# Patient Record
Sex: Male | Born: 1935 | Race: White | Hispanic: No | Smoking: Current some day smoker
Health system: Southern US, Community
[De-identification: ages and names within clinical notes are randomized; demographics above are authoritative.]

## PROBLEM LIST (undated history)

## (undated) ENCOUNTER — Emergency Department (HOSPITAL_COMMUNITY): Disposition: A | Discharge status: Home / Self Care | Payer: Medicare Other

## (undated) DIAGNOSIS — G8929 Other chronic pain: Secondary | ICD-10-CM

## (undated) DIAGNOSIS — J189 Pneumonia, unspecified organism: Secondary | ICD-10-CM

## (undated) DIAGNOSIS — K922 Gastrointestinal hemorrhage, unspecified: Secondary | ICD-10-CM

## (undated) DIAGNOSIS — E119 Type 2 diabetes mellitus without complications: Secondary | ICD-10-CM

## (undated) DIAGNOSIS — J40 Bronchitis, not specified as acute or chronic: Secondary | ICD-10-CM

## (undated) DIAGNOSIS — R112 Nausea with vomiting, unspecified: Secondary | ICD-10-CM

## (undated) DIAGNOSIS — Z9889 Other specified postprocedural states: Secondary | ICD-10-CM

## (undated) DIAGNOSIS — Z5900 Homelessness unspecified: Secondary | ICD-10-CM

## (undated) DIAGNOSIS — I10 Essential (primary) hypertension: Secondary | ICD-10-CM

## (undated) DIAGNOSIS — K859 Acute pancreatitis without necrosis or infection, unspecified: Secondary | ICD-10-CM

## (undated) DIAGNOSIS — S42009A Fracture of unspecified part of unspecified clavicle, initial encounter for closed fracture: Secondary | ICD-10-CM

## (undated) DIAGNOSIS — E871 Hypo-osmolality and hyponatremia: Secondary | ICD-10-CM

## (undated) DIAGNOSIS — F329 Major depressive disorder, single episode, unspecified: Secondary | ICD-10-CM

## (undated) DIAGNOSIS — R0600 Dyspnea, unspecified: Secondary | ICD-10-CM

## (undated) DIAGNOSIS — M549 Dorsalgia, unspecified: Secondary | ICD-10-CM

## (undated) DIAGNOSIS — F32A Depression, unspecified: Secondary | ICD-10-CM

## (undated) DIAGNOSIS — J449 Chronic obstructive pulmonary disease, unspecified: Secondary | ICD-10-CM

## (undated) DIAGNOSIS — D649 Anemia, unspecified: Secondary | ICD-10-CM

## (undated) DIAGNOSIS — I4891 Unspecified atrial fibrillation: Secondary | ICD-10-CM

## (undated) HISTORY — PX: TONSILLECTOMY: SUR1361

## (undated) HISTORY — PX: ESOPHAGOGASTRODUODENOSCOPY: SHX1529

## (undated) HISTORY — PX: VASECTOMY: SHX75

## (undated) HISTORY — PX: APPENDECTOMY: SHX54

## (undated) HISTORY — PX: COLONOSCOPY: SHX174

---

## 2005-10-22 ENCOUNTER — Ambulatory Visit: Payer: Self-pay | Admitting: Internal Medicine

## 2005-12-17 ENCOUNTER — Ambulatory Visit: Payer: Self-pay | Admitting: Internal Medicine

## 2006-03-14 ENCOUNTER — Ambulatory Visit: Payer: Self-pay | Admitting: Internal Medicine

## 2006-04-04 ENCOUNTER — Ambulatory Visit: Payer: Self-pay | Admitting: Internal Medicine

## 2006-04-23 ENCOUNTER — Ambulatory Visit (HOSPITAL_COMMUNITY): Admission: RE | Admit: 2006-04-23 | Discharge: 2006-04-23 | Payer: Self-pay | Admitting: Internal Medicine

## 2007-08-17 ENCOUNTER — Emergency Department (HOSPITAL_COMMUNITY): Admission: EM | Admit: 2007-08-17 | Discharge: 2007-08-18 | Payer: Self-pay | Admitting: Emergency Medicine

## 2007-09-29 ENCOUNTER — Ambulatory Visit: Payer: Self-pay | Admitting: Internal Medicine

## 2008-03-05 ENCOUNTER — Inpatient Hospital Stay (HOSPITAL_COMMUNITY): Admission: EM | Admit: 2008-03-05 | Discharge: 2008-03-11 | Payer: Self-pay | Admitting: Emergency Medicine

## 2008-03-28 ENCOUNTER — Ambulatory Visit: Payer: Self-pay | Admitting: Internal Medicine

## 2008-10-25 ENCOUNTER — Emergency Department (HOSPITAL_COMMUNITY): Admission: EM | Admit: 2008-10-25 | Discharge: 2008-10-26 | Payer: Self-pay | Admitting: Emergency Medicine

## 2010-03-02 ENCOUNTER — Emergency Department (HOSPITAL_COMMUNITY)
Admission: EM | Admit: 2010-03-02 | Discharge: 2010-03-02 | Payer: Self-pay | Source: Home / Self Care | Admitting: Emergency Medicine

## 2010-03-05 LAB — DIFFERENTIAL
Basophils Relative: 0 % (ref 0–1)
Eosinophils Absolute: 0 10*3/uL (ref 0.0–0.7)
Eosinophils Relative: 0 % (ref 0–5)
Lymphs Abs: 0.9 10*3/uL (ref 0.7–4.0)
Neutrophils Relative %: 89 % — ABNORMAL HIGH (ref 43–77)

## 2010-03-05 LAB — URINALYSIS, ROUTINE W REFLEX MICROSCOPIC
Ketones, ur: 15 mg/dL — AB
Nitrite: NEGATIVE
Protein, ur: 30 mg/dL — AB

## 2010-03-05 LAB — HEPATIC FUNCTION PANEL
ALT: 19 U/L (ref 0–53)
AST: 33 U/L (ref 0–37)
Albumin: 3.5 g/dL (ref 3.5–5.2)
Alkaline Phosphatase: 235 U/L — ABNORMAL HIGH (ref 39–117)
Total Bilirubin: 0.9 mg/dL (ref 0.3–1.2)
Total Protein: 7.9 g/dL (ref 6.0–8.3)

## 2010-03-05 LAB — CBC
MCH: 32 pg (ref 26.0–34.0)
MCV: 92.5 fL (ref 78.0–100.0)
Platelets: 590 10*3/uL — ABNORMAL HIGH (ref 150–400)
RBC: 4.69 MIL/uL (ref 4.22–5.81)
RDW: 12.8 % (ref 11.5–15.5)
WBC: 20 10*3/uL — ABNORMAL HIGH (ref 4.0–10.5)

## 2010-03-05 LAB — BASIC METABOLIC PANEL
BUN: 44 mg/dL — ABNORMAL HIGH (ref 6–23)
Chloride: 96 mEq/L (ref 96–112)
Creatinine, Ser: 1.91 mg/dL — ABNORMAL HIGH (ref 0.4–1.5)
GFR calc Af Amer: 41 mL/min — ABNORMAL LOW (ref >60)
GFR calc non Af Amer: 34 mL/min — ABNORMAL LOW (ref >60)
Potassium: 4.1 mEq/L (ref 3.5–5.1)

## 2010-03-05 LAB — URINE MICROSCOPIC-ADD ON

## 2010-05-28 LAB — CBC
HCT: 35.5 % — ABNORMAL LOW (ref 39.0–52.0)
HCT: 39.1 % (ref 39.0–52.0)
HCT: 39.4 % (ref 39.0–52.0)
Hemoglobin: 13.1 g/dL (ref 13.0–17.0)
Hemoglobin: 13.2 g/dL (ref 13.0–17.0)
Hemoglobin: 14.3 g/dL (ref 13.0–17.0)
MCHC: 32.8 g/dL (ref 30.0–36.0)
MCHC: 33.6 g/dL (ref 30.0–36.0)
MCV: 90.6 fL (ref 78.0–100.0)
MCV: 91.9 fL (ref 78.0–100.0)
Platelets: 347 10*3/uL (ref 150–400)
Platelets: 386 10*3/uL (ref 150–400)
Platelets: 408 10*3/uL — ABNORMAL HIGH (ref 150–400)
Platelets: 466 10*3/uL — ABNORMAL HIGH (ref 150–400)
RBC: 4.35 MIL/uL (ref 4.22–5.81)
RDW: 14 % (ref 11.5–15.5)
RDW: 14 % (ref 11.5–15.5)
RDW: 14.3 % (ref 11.5–15.5)
WBC: 10.4 10*3/uL (ref 4.0–10.5)
WBC: 12.8 10*3/uL — ABNORMAL HIGH (ref 4.0–10.5)

## 2010-05-28 LAB — URINALYSIS, ROUTINE W REFLEX MICROSCOPIC
Bilirubin Urine: NEGATIVE
Glucose, UA: NEGATIVE mg/dL
Ketones, ur: NEGATIVE mg/dL
Leukocytes, UA: NEGATIVE
Protein, ur: 100 mg/dL — AB
pH: 6.5 (ref 5.0–8.0)

## 2010-05-28 LAB — BASIC METABOLIC PANEL
BUN: 10 mg/dL (ref 6–23)
BUN: 13 mg/dL (ref 6–23)
BUN: 16 mg/dL (ref 6–23)
BUN: 3 mg/dL — ABNORMAL LOW (ref 6–23)
BUN: 5 mg/dL — ABNORMAL LOW (ref 6–23)
BUN: 7 mg/dL (ref 6–23)
BUN: 8 mg/dL (ref 6–23)
CO2: 23 mEq/L (ref 19–32)
CO2: 28 mEq/L (ref 19–32)
Calcium: 8.6 mg/dL (ref 8.4–10.5)
Calcium: 9.2 mg/dL (ref 8.4–10.5)
Chloride: 100 mEq/L (ref 96–112)
Chloride: 104 mEq/L (ref 96–112)
Chloride: 89 mEq/L — ABNORMAL LOW (ref 96–112)
Chloride: 94 mEq/L — ABNORMAL LOW (ref 96–112)
Creatinine, Ser: 0.53 mg/dL (ref 0.4–1.5)
Creatinine, Ser: 0.73 mg/dL (ref 0.4–1.5)
Creatinine, Ser: 0.79 mg/dL (ref 0.4–1.5)
Creatinine, Ser: 0.98 mg/dL (ref 0.4–1.5)
Creatinine, Ser: 1.07 mg/dL (ref 0.4–1.5)
GFR calc Af Amer: >60 mL/min (ref >60)
GFR calc Af Amer: >60 mL/min (ref >60)
GFR calc non Af Amer: >60 mL/min (ref >60)
GFR calc non Af Amer: >60 mL/min (ref >60)
GFR calc non Af Amer: >60 mL/min (ref >60)
GFR calc non Af Amer: >60 mL/min (ref >60)
GFR calc non Af Amer: >60 mL/min (ref >60)
GFR calc non Af Amer: >60 mL/min (ref >60)
Glucose, Bld: 88 mg/dL (ref 70–99)
Glucose, Bld: 93 mg/dL (ref 70–99)
Glucose, Bld: 94 mg/dL (ref 70–99)
Glucose, Bld: 95 mg/dL (ref 70–99)
Potassium: 3.6 mEq/L (ref 3.5–5.1)
Potassium: 3.6 mEq/L (ref 3.5–5.1)
Potassium: 3.8 mEq/L (ref 3.5–5.1)
Potassium: 4.1 mEq/L (ref 3.5–5.1)
Sodium: 132 mEq/L — ABNORMAL LOW (ref 135–145)
Sodium: 133 mEq/L — ABNORMAL LOW (ref 135–145)

## 2010-05-28 LAB — RAPID URINE DRUG SCREEN, HOSP PERFORMED
Amphetamines: NOT DETECTED
Benzodiazepines: NOT DETECTED
Cocaine: NOT DETECTED
Opiates: NOT DETECTED
Tetrahydrocannabinol: NOT DETECTED

## 2010-05-28 LAB — FOLATE RBC: RBC Folate: 746 ng/mL — ABNORMAL HIGH (ref 180–600)

## 2010-05-28 LAB — DIFFERENTIAL
Basophils Absolute: 0.1 10*3/uL (ref 0.0–0.1)
Lymphocytes Relative: 4 % — ABNORMAL LOW (ref 12–46)
Monocytes Absolute: 0.7 10*3/uL (ref 0.1–1.0)
Neutro Abs: 17.9 10*3/uL — ABNORMAL HIGH (ref 1.7–7.7)
Neutrophils Relative %: 91 % — ABNORMAL HIGH (ref 43–77)

## 2010-05-28 LAB — COMPREHENSIVE METABOLIC PANEL
ALT: 23 U/L (ref 0–53)
Albumin: 3.6 g/dL (ref 3.5–5.2)
Calcium: 9.1 mg/dL (ref 8.4–10.5)
Glucose, Bld: 106 mg/dL — ABNORMAL HIGH (ref 70–99)
Sodium: 134 mEq/L — ABNORMAL LOW (ref 135–145)
Total Protein: 7.8 g/dL (ref 6.0–8.3)

## 2010-05-28 LAB — VITAMIN B12: Vitamin B-12: 590 pg/mL (ref 211–911)

## 2010-05-28 LAB — CREATININE, URINE, RANDOM: Creatinine, Urine: 56.4 mg/dL

## 2010-05-30 ENCOUNTER — Emergency Department (HOSPITAL_COMMUNITY): Payer: Medicare Other

## 2010-05-30 ENCOUNTER — Emergency Department (HOSPITAL_COMMUNITY)
Admission: EM | Admit: 2010-05-30 | Discharge: 2010-05-30 | Disposition: A | Discharge status: Home / Self Care | Payer: Medicare Other | Attending: Emergency Medicine | Admitting: Emergency Medicine

## 2010-05-30 DIAGNOSIS — M545 Low back pain, unspecified: Secondary | ICD-10-CM | POA: Insufficient documentation

## 2010-05-30 DIAGNOSIS — H81399 Other peripheral vertigo, unspecified ear: Secondary | ICD-10-CM | POA: Insufficient documentation

## 2010-05-30 DIAGNOSIS — R42 Dizziness and giddiness: Secondary | ICD-10-CM | POA: Insufficient documentation

## 2010-05-30 LAB — CBC
MCV: 86.9 fL (ref 78.0–100.0)
Platelets: 372 10*3/uL (ref 150–400)
RDW: 13.1 % (ref 11.5–15.5)
WBC: 10.8 10*3/uL — ABNORMAL HIGH (ref 4.0–10.5)

## 2010-05-30 LAB — BASIC METABOLIC PANEL
BUN: 18 mg/dL (ref 6–23)
GFR calc Af Amer: >60 mL/min (ref >60)
GFR calc non Af Amer: >60 mL/min (ref >60)
Potassium: 3.9 mEq/L (ref 3.5–5.1)

## 2010-05-30 LAB — DIFFERENTIAL
Basophils Absolute: 0.1 10*3/uL (ref 0.0–0.1)
Basophils Relative: 1 % (ref 0–1)
Eosinophils Absolute: 0.2 10*3/uL (ref 0.0–0.7)
Eosinophils Relative: 2 % (ref 0–5)
Lymphs Abs: 0.9 10*3/uL (ref 0.7–4.0)
Neutrophils Relative %: 82 % — ABNORMAL HIGH (ref 43–77)

## 2010-05-30 LAB — TROPONIN I: Troponin I: 0.01 ng/mL (ref 0.00–0.06)

## 2010-06-13 ENCOUNTER — Emergency Department (HOSPITAL_COMMUNITY)
Admission: EM | Admit: 2010-06-13 | Discharge: 2010-06-13 | Disposition: A | Discharge status: Home / Self Care | Payer: Medicare Other | Attending: Emergency Medicine | Admitting: Emergency Medicine

## 2010-06-13 ENCOUNTER — Emergency Department (HOSPITAL_COMMUNITY): Payer: Medicare Other

## 2010-06-13 DIAGNOSIS — M19049 Primary osteoarthritis, unspecified hand: Secondary | ICD-10-CM | POA: Insufficient documentation

## 2010-06-13 DIAGNOSIS — S51809A Unspecified open wound of unspecified forearm, initial encounter: Secondary | ICD-10-CM | POA: Insufficient documentation

## 2010-06-13 DIAGNOSIS — Y92009 Unspecified place in unspecified non-institutional (private) residence as the place of occurrence of the external cause: Secondary | ICD-10-CM | POA: Insufficient documentation

## 2010-06-13 DIAGNOSIS — W1809XA Striking against other object with subsequent fall, initial encounter: Secondary | ICD-10-CM | POA: Insufficient documentation

## 2010-06-26 NOTE — Discharge Summary (Signed)
NAMEWILLYS, Dale Mcfarland                ACCOUNT NO.:  [id number]   MEDICAL RECORD NO.:  [id number]          PATIENT TYPE:  INP   LOCATION:  1427                         FACILITY[facility]  [facility]   PHYSICIAN:  Altha Harm, MDDATE OF BIRTH:  29-Mar-1929   DATE OF ADMISSION:  03/04/2008  DATE OF DISCHARGE:  03/09/2008                               DISCHARGE SUMMARY   DISCHARGE DISPOSITION:  Home.   FINAL DISCHARGE DIAGNOSES:  1. Pneumonia.  2. 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  3. Emphysema.  4. Hypertension.  5. Tobacco use disorder.  6. Alcohol use disorder.  7. Gait disturbance.   DISCHARGE MEDICATIONS:  1. Avelox 400 mg p.o. daily x3.  2. Thiamine 100 mg p.o. daily.  3. Folate 1 mg p.o. daily.  4. Norvasc 5 mg p.o. daily.  5. Albuterol MDI 2 puffs p.o. q.4 h. p.r.n.  6. Atrovent MDI 1 puff inhaled p.o. q.8 h.   CONSULTATIONS:  None.   PROCEDURE:  None.   DIAGNOSTICS:  1. Chest x-ray two-view done on admission on March 04, 2008 which      shows left lower lobe pneumonia with small left pleural effusion.      Chronic interstitial opacities upper limits of normal, heart size,      COPD and emphysema.  2. Repeat chest x-ray done on March 06, 2008 shows improvement in      left lower lobe infiltrate.  A rounded density in the left upper      lobe which may represent a focal infiltrate,  pulmonary nodule or      healed fracture.  3. CT chest without contrast done on March 08, 2008 which shows      small layering of pleural effusion slightly larger on the left.  An      indistinct nodule in the lower lobe pulmonary opacity bilaterally.      Superimposed on changes of early pulmonary fibrosis and central      lobular emphysema.  Acute infection in the lower lobe is favored.   IMPRESSION:  1. Mediastinal lymphadenopathy in the form of increased number of      small nodes, favor reactive.  2. Cholelithiasis.  3. Multiple right-sided rib fractures, some maybe subacute.   LABORATORY DATA:  At the time of discharge, the patient's sodium is 123,  potassium 3.8, chloride 89, bicarb 24, BUN 5, creatinine 0.79, white  blood cell count 10.4, hemoglobin 11.9, hematocrit 35.5, platelet count  347.  Of note, the patient has a mildly elevated alkaline phosphatase of  133, however, his other liver enzymes are within normal limits.   ALLERGIES:  PENICILLIN.   CODE STATUS:  Full code.   PRIMARY CARE PHYSICIAN:  Dr. Linton Rump, [facility].   CHIEF COMPLAINT:  Cough, fever and chills.   HISTORY OF PRESENT ILLNESS:  Please refer to the H and P dictated by Dr.  Lovell Sheehan for details of the HPI.   HOSPITAL COURSE:  1. The patient was admitted and found to have pneumonia.  The patient  was started on IV antibiotics.  He improved clinically with      resolution of his hypoxia and normalization of his white blood cell      count.  The patient had no fevers while hospitalized.  The patient      was then transitioned over to oral Avelox which he tolerated      without any difficulty.  2. Hyponatremia.  The patient was found to have low sodium.  The      patient reports that approximately a year ago, he was told that his      sodium was a little low.  However, he does not know the specific      number level of sodium.  The patient's sodiums have decreased as      low as 123 despite repletion of his fluids.  Currently, the patient      is being fluid restricted with Lasix being added.  We will recheck      his sodium levels to evaluate for improvement in his serum sodium.      The patient has urine sodium and creatinine pending at this time.  3. Abnormal gait.  The patient reported an ataxic gait.  The patient      had been started on Reglan and this was discontinued.  Physical      therapy evaluated the patient and recommended the patient have a      rolling walker for stability, and did not feel there was  any      further therapy indicated on this patient.  4. Alcohol use disorder.  The patient is intermittently a heavy binge      drinker.  He denies any previous episodes of withdrawal.  The      patient is placed on a withdrawal protocol here in the hospital,      but has not really required any benzodiazepines and he has shown no      signs of withdrawal.  5. Tobacco use disorder.  The patient is a habitual tobacco user.  He      has been counseled in tobacco cessation and is in the contemplative      state of cessation abuse.      Altha Harm, MD  Electronically Signed     MAM/MEDQ  D:  03/09/2008  T:  03/09/2008  Job:  [id number]

## 2010-06-26 NOTE — H&P (Signed)
Dale Mcfarland, Dale Mcfarland                ACCOUNT NO.:  [id number]   MEDICAL RECORD NO.:  [id number]          PATIENT TYPE:  INP   LOCATION:  1427                         FACILITY:  [facility]   PHYSICIAN:  Della Goo, M.D. DATE OF BIRTH:  [birth year]   DATE OF ADMISSION:  03/05/2008  DATE OF DISCHARGE:                              HISTORY & PHYSICAL   PRIMARY CARE PHYSICIAN:  None.   CHIEF COMPLAINT:  Cough, fevers, chills.   HISTORY OF PRESENT ILLNESS:  This is a 77 year old male who presents to  the emergency department with complaints of worsening shortness of  breath, cough and chest congestion, worse over the past 3 days.  The  patient states that he has been short of breath and coughing for about  one month..  The patient has a history of heavy tobacco usage and  alcoholism and is currently homeless.   PAST MEDICAL HISTORY:  No documented past medical problems.   MEDICATIONS:  None.   PAST SURGICAL HISTORY:  None.   ALLERGIES:  PENICILLINS.   SOCIAL HISTORY:  The patient is a smoker.  He reports smoking  intermittently and he states that he does smoke occasionally one and  half packs daily.  He also reports drinking and he states that when he  can drink eating drinks one pint a day.   FAMILY HISTORY:  Noncontributory.   REVIEW OF SYSTEMS:  Pertinents are mentioned above.  All other organ  systems are negative.   PHYSICAL EXAMINATION FINDINGS:  GENERAL:  This is a 77 year old  disheveled, unkempt male who is thin but in no acute distress.  VITAL SIGNS: Temperature 97.2, blood pressure 200/110 initially, heart  rate 98, respirations 20, O2 sats 96% on 2 liters nasal cannula oxygen.  Blood pressure did improve to 138/89.  HEENT:  Examination normocephalic, atraumatic.  There is no scleral  icterus or scleral injection.  Pupils are equally round reactive to  light.  Extraocular movements are intact.  Funduscopic benign.  Nares  are patent bilaterally.  Oropharynx is clear.   Dentition is poor.  NECK:  Supple full range of motion.  No thyromegaly, adenopathy, jugular  venous distention.  CARDIOVASCULAR:  Regular rate and rhythm.  No murmurs, gallops or rubs.  LUNGS:  Diffuse rhonchorous breath sounds, no expiratory wheezes and no  rales.  ABDOMEN:  Positive bowel sounds, soft, nontender, nondistended.  No  hepatosplenomegaly.  EXTREMITIES:  Without cyanosis, clubbing or edema.  NEUROLOGIC:  Examination alert and oriented x3.  There are no focal  deficits on examination.   LABORATORY STUDIES:  White blood cell count 19.8, hemoglobin 14.3,  hematocrit 43.8, platelets 466.  Sodium 134, potassium 4.2, chloride  101, bicarb 24, BUN 16, creatinine 0.83 and glucose 106, albumin 3.6,  AST 26, ALT 23.  Urinalysis trace hemoglobin.  Urine drug screen  negative.  Chest x-ray reveals left lower lobe pneumonia with small  effusion, chronic interstitial opacities and COPD/emphysematous changes.   ASSESSMENT:  A 77 year old male being admitted with  1. Left lower lobe pneumonia.  2. Hypoxia.  3. Alcohol abuse.  4. Tobacco abuse.  PLAN:  The patient will be admitted and placed on IV antibiotic therapy.  He was administered Rocephin and azithromycin in the emergency  department.  And suffered an allergic reaction to most probably the  Rocephin.  He had itching after the administration.  The patient does  have a documented penicillin allergy and 15% penicillin allergies are  also allergic to cephalosporins, so this will be listed as an allergy  for this gentleman and his antibiotic therapy will be changed to Avelox  therapy.  Benadryl has also been ordered p.r.n. itching and rash.  The  patient will also be placed on supplemental oxygen and nebulizer  treatments.  The patient will be placed on the IV Ativan alcohol  withdrawal protocol, and a nicotine patch has also been ordered p.r.n.  medications for the elevated blood pressure will also be ordered for  now.  The  patient will be placed on Mucinex for congestion and  Robitussin for coughing symptoms and DVT and GI prophylaxis have been  ordered.  Further workup will ensue pending results of the patient's  clinical course and results of his laboratory studies.  Counseling will  be requested for tobacco cessation and alcohol cessation.      Della Goo, M.D.  Electronically Signed     HJ/MEDQ  D:  03/06/2008  T:  03/06/2008  Job:  [id number]

## 2010-06-26 NOTE — Discharge Summary (Signed)
NAMEBRAYON, Dale Mcfarland                ACCOUNT NO.:  [id number]   MEDICAL RECORD NO.:  [id number]          PATIENT TYPE:  INP   LOCATION:  1427                         [facility]AC[facility]   PHYSICIAN:  Altha Harm, MDDATE OF BIRTH:  1929/03/17   DATE OF ADMISSION:  03/04/2008  DATE OF DISCHARGE:  03/11/2008                               DISCHARGE SUMMARY   DISCHARGE DISPOSITION:  To homeless shelter.   FINAL DISCHARGE DIAGNOSES:  Please refer to the discharge summary on  January 27 for details of the discharge diagnoses.   DISCHARGE MEDICATIONS:  Modifications to that include Avelox 400 mg p.o.  daily x1 day.  Additional medication includes Lasix 20 mg p.o. daily.   Hospital course entirely up to the 27th is detailed in the discharge  summary dated that date.   ADDITIONAL HOSPITAL COURSE:  The patient continued to have an ataxic  gait and it was felt that he could not be discharged home at that time.  Particularly in light of the fact that the patient would be going to a  shelter and needed to be ambulatory in order to participate in the  shelter program, the patient's discharge was further held at that time  and physical therapy saw the patient and worked with the patient.  The  patient was able to ambulate 200 feet with only standby assist in their  estimation and they recommended that the patient should have a rolling  walker for additional support in uneven territory.  The patient  continued to have hyponatremia.  He has his fluids limited to 2 liters  per day and in addition Lasix started.  He had a mild increase in his  sodium from 123 to 126.  The patient is totally alert, awake, and  mentating well.  He has no symptoms that could be attributable to the  low sodium and it does appar that his low sodium is completely chronic.   FOLLOWUP:  The patient is to follow up with his primary care physician  at [facility] in 1 week.   DIETARY RESTRICTIONS:  The patient should be  on a fluid restriction of 2  liters of fluid per day and he should be on a regular diet.   Today the patient is clinically stable.  His temperature is 98.4, heart  rate is 77, blood pressure 130/62, respiratory rate of 18, O2 sat is 97%  on room air.  Ambulatory.  Breath sounds are clear to auscultation.  The  patient has a normal S1 and S2, no murmurs, rubs or gallops are noted.  PMI is nondisplaced.  No heaves or thrills on palpation.  Abdomen is  obese, soft, nontender, nondistended, no masses, no hepatosplenomegaly  noted.  In general, the patient  is alert, he is oriented x3, he is able to conduct normal conversation  with good cognition and insight.  The patient is clinically stable for  discharge.   Total time spent on discharge 41 minutes.      Altha Harm, MD  Electronically Signed     MAM/MEDQ  D:  03/11/2008  T:  03/11/2008  Job:  [id number]

## 2010-10-09 ENCOUNTER — Emergency Department (HOSPITAL_COMMUNITY)
Admission: EM | Admit: 2010-10-09 | Discharge: 2010-10-09 | Disposition: A | Discharge status: Home / Self Care | Payer: Medicare Other | Attending: Emergency Medicine | Admitting: Emergency Medicine

## 2010-10-09 ENCOUNTER — Emergency Department (HOSPITAL_COMMUNITY): Payer: Medicare Other

## 2010-10-09 DIAGNOSIS — Z5900 Homelessness unspecified: Secondary | ICD-10-CM | POA: Insufficient documentation

## 2010-10-09 DIAGNOSIS — M549 Dorsalgia, unspecified: Secondary | ICD-10-CM | POA: Insufficient documentation

## 2010-10-09 DIAGNOSIS — W1789XA Other fall from one level to another, initial encounter: Secondary | ICD-10-CM | POA: Insufficient documentation

## 2010-10-09 DIAGNOSIS — R05 Cough: Secondary | ICD-10-CM | POA: Insufficient documentation

## 2010-10-09 DIAGNOSIS — R059 Cough, unspecified: Secondary | ICD-10-CM | POA: Insufficient documentation

## 2010-10-09 DIAGNOSIS — R079 Chest pain, unspecified: Secondary | ICD-10-CM | POA: Insufficient documentation

## 2010-10-31 ENCOUNTER — Encounter (HOSPITAL_COMMUNITY): Payer: Self-pay | Admitting: Radiology

## 2010-10-31 ENCOUNTER — Emergency Department (HOSPITAL_COMMUNITY): Payer: Medicare Other

## 2010-10-31 ENCOUNTER — Emergency Department (HOSPITAL_COMMUNITY)
Admission: EM | Admit: 2010-10-31 | Discharge: 2010-10-31 | Disposition: A | Discharge status: Home / Self Care | Payer: Medicare Other | Attending: Emergency Medicine | Admitting: Emergency Medicine

## 2010-10-31 DIAGNOSIS — W19XXXA Unspecified fall, initial encounter: Secondary | ICD-10-CM | POA: Insufficient documentation

## 2010-10-31 DIAGNOSIS — F1010 Alcohol abuse, uncomplicated: Secondary | ICD-10-CM | POA: Insufficient documentation

## 2010-10-31 DIAGNOSIS — G9389 Other specified disorders of brain: Secondary | ICD-10-CM | POA: Insufficient documentation

## 2010-10-31 DIAGNOSIS — S060X1A Concussion with loss of consciousness of 30 minutes or less, initial encounter: Secondary | ICD-10-CM | POA: Insufficient documentation

## 2010-10-31 DIAGNOSIS — R51 Headache: Secondary | ICD-10-CM | POA: Insufficient documentation

## 2010-10-31 DIAGNOSIS — S0280XA Fracture of other specified skull and facial bones, unspecified side, initial encounter for closed fracture: Secondary | ICD-10-CM | POA: Insufficient documentation

## 2010-10-31 HISTORY — DX: Chronic obstructive pulmonary disease, unspecified: J44.9

## 2010-10-31 LAB — RAPID URINE DRUG SCREEN, HOSP PERFORMED
Barbiturates: NOT DETECTED
Cocaine: NOT DETECTED

## 2010-10-31 LAB — DIFFERENTIAL
Basophils Absolute: 0.1 10*3/uL (ref 0.0–0.1)
Eosinophils Absolute: 0.5 10*3/uL (ref 0.0–0.7)
Eosinophils Relative: 4 % (ref 0–5)
Lymphocytes Relative: 16 % (ref 12–46)
Monocytes Absolute: 1.1 10*3/uL — ABNORMAL HIGH (ref 0.1–1.0)

## 2010-10-31 LAB — URINALYSIS, ROUTINE W REFLEX MICROSCOPIC
Bilirubin Urine: NEGATIVE
Ketones, ur: NEGATIVE mg/dL
Leukocytes, UA: NEGATIVE
Nitrite: NEGATIVE
Protein, ur: NEGATIVE mg/dL
pH: 6 (ref 5.0–8.0)

## 2010-10-31 LAB — ETHANOL: Alcohol, Ethyl (B): 157 mg/dL — ABNORMAL HIGH (ref 0–11)

## 2010-10-31 LAB — CBC
HCT: 30.9 % — ABNORMAL LOW (ref 39.0–52.0)
MCHC: 30.7 g/dL (ref 30.0–36.0)
MCV: 77.3 fL — ABNORMAL LOW (ref 78.0–100.0)
Platelets: 356 10*3/uL (ref 150–400)
RDW: 19 % — ABNORMAL HIGH (ref 11.5–15.5)

## 2010-10-31 LAB — POCT I-STAT, CHEM 8
BUN: 17 mg/dL (ref 6–23)
Calcium, Ion: 1.15 mmol/L (ref 1.12–1.32)
HCT: 34 % — ABNORMAL LOW (ref 39.0–52.0)
Hemoglobin: 11.6 g/dL — ABNORMAL LOW (ref 13.0–17.0)
Sodium: 138 mEq/L (ref 135–145)
TCO2: 22 mmol/L (ref 0–100)

## 2010-12-05 ENCOUNTER — Emergency Department (HOSPITAL_COMMUNITY)
Admission: EM | Admit: 2010-12-05 | Discharge: 2010-12-05 | Disposition: A | Discharge status: Home / Self Care | Payer: Medicare Other | Attending: Emergency Medicine | Admitting: Emergency Medicine

## 2010-12-05 DIAGNOSIS — F1010 Alcohol abuse, uncomplicated: Secondary | ICD-10-CM | POA: Insufficient documentation

## 2011-02-02 ENCOUNTER — Encounter (HOSPITAL_COMMUNITY): Payer: Self-pay

## 2011-02-02 ENCOUNTER — Emergency Department (HOSPITAL_COMMUNITY): Payer: Medicare Other

## 2011-02-02 ENCOUNTER — Other Ambulatory Visit: Payer: Self-pay

## 2011-02-02 ENCOUNTER — Inpatient Hospital Stay (HOSPITAL_COMMUNITY)
Admission: EM | Admit: 2011-02-02 | Discharge: 2011-02-06 | DRG: 178 | Disposition: A | Discharge status: Home / Self Care | Payer: Medicare Other | Attending: Internal Medicine | Admitting: Internal Medicine

## 2011-02-02 DIAGNOSIS — N390 Urinary tract infection, site not specified: Secondary | ICD-10-CM

## 2011-02-02 DIAGNOSIS — Z5900 Homelessness unspecified: Secondary | ICD-10-CM

## 2011-02-02 DIAGNOSIS — E872 Acidosis, unspecified: Secondary | ICD-10-CM | POA: Diagnosis present

## 2011-02-02 DIAGNOSIS — B3781 Candidal esophagitis: Secondary | ICD-10-CM | POA: Diagnosis present

## 2011-02-02 DIAGNOSIS — T68XXXA Hypothermia, initial encounter: Secondary | ICD-10-CM | POA: Diagnosis present

## 2011-02-02 DIAGNOSIS — D62 Acute posthemorrhagic anemia: Secondary | ICD-10-CM | POA: Diagnosis present

## 2011-02-02 DIAGNOSIS — K922 Gastrointestinal hemorrhage, unspecified: Secondary | ICD-10-CM | POA: Diagnosis present

## 2011-02-02 DIAGNOSIS — D72829 Elevated white blood cell count, unspecified: Secondary | ICD-10-CM | POA: Diagnosis present

## 2011-02-02 DIAGNOSIS — K449 Diaphragmatic hernia without obstruction or gangrene: Secondary | ICD-10-CM | POA: Diagnosis present

## 2011-02-02 DIAGNOSIS — J690 Pneumonitis due to inhalation of food and vomit: Principal | ICD-10-CM | POA: Diagnosis present

## 2011-02-02 DIAGNOSIS — I959 Hypotension, unspecified: Secondary | ICD-10-CM | POA: Diagnosis present

## 2011-02-02 DIAGNOSIS — X31XXXA Exposure to excessive natural cold, initial encounter: Secondary | ICD-10-CM | POA: Diagnosis present

## 2011-02-02 DIAGNOSIS — D500 Iron deficiency anemia secondary to blood loss (chronic): Secondary | ICD-10-CM | POA: Diagnosis present

## 2011-02-02 DIAGNOSIS — I10 Essential (primary) hypertension: Secondary | ICD-10-CM | POA: Diagnosis present

## 2011-02-02 DIAGNOSIS — R6510 Systemic inflammatory response syndrome (SIRS) of non-infectious origin without acute organ dysfunction: Secondary | ICD-10-CM

## 2011-02-02 DIAGNOSIS — J189 Pneumonia, unspecified organism: Secondary | ICD-10-CM | POA: Diagnosis present

## 2011-02-02 DIAGNOSIS — K5900 Constipation, unspecified: Secondary | ICD-10-CM | POA: Diagnosis not present

## 2011-02-02 DIAGNOSIS — G8929 Other chronic pain: Secondary | ICD-10-CM | POA: Diagnosis present

## 2011-02-02 DIAGNOSIS — F1020 Alcohol dependence, uncomplicated: Secondary | ICD-10-CM | POA: Diagnosis present

## 2011-02-02 DIAGNOSIS — J4489 Other specified chronic obstructive pulmonary disease: Secondary | ICD-10-CM | POA: Diagnosis present

## 2011-02-02 DIAGNOSIS — Y999 Unspecified external cause status: Secondary | ICD-10-CM

## 2011-02-02 DIAGNOSIS — J449 Chronic obstructive pulmonary disease, unspecified: Secondary | ICD-10-CM | POA: Diagnosis present

## 2011-02-02 DIAGNOSIS — F17200 Nicotine dependence, unspecified, uncomplicated: Secondary | ICD-10-CM | POA: Diagnosis present

## 2011-02-02 DIAGNOSIS — M549 Dorsalgia, unspecified: Secondary | ICD-10-CM | POA: Diagnosis present

## 2011-02-02 DIAGNOSIS — Z880 Allergy status to penicillin: Secondary | ICD-10-CM

## 2011-02-02 HISTORY — DX: Major depressive disorder, single episode, unspecified: F32.9

## 2011-02-02 HISTORY — DX: Pneumonia, unspecified organism: J18.9

## 2011-02-02 HISTORY — DX: Dorsalgia, unspecified: M54.9

## 2011-02-02 HISTORY — DX: Depression, unspecified: F32.A

## 2011-02-02 HISTORY — DX: Other chronic pain: G89.29

## 2011-02-02 LAB — CBC
HCT: 25.3 % — ABNORMAL LOW (ref 39.0–52.0)
Hemoglobin: 7.9 g/dL — ABNORMAL LOW (ref 13.0–17.0)
MCV: 79.3 fL (ref 78.0–100.0)
WBC: 23.1 10*3/uL — ABNORMAL HIGH (ref 4.0–10.5)

## 2011-02-02 LAB — COMPREHENSIVE METABOLIC PANEL
Albumin: 3.1 g/dL — ABNORMAL LOW (ref 3.5–5.2)
Alkaline Phosphatase: 118 U/L — ABNORMAL HIGH (ref 39–117)
BUN: 27 mg/dL — ABNORMAL HIGH (ref 6–23)
CO2: 22 mEq/L (ref 19–32)
Chloride: 97 mEq/L (ref 96–112)
GFR calc Af Amer: 80 mL/min — ABNORMAL LOW (ref >90)
GFR calc non Af Amer: 69 mL/min — ABNORMAL LOW (ref >90)
Glucose, Bld: 110 mg/dL — ABNORMAL HIGH (ref 70–99)
Potassium: 4.7 mEq/L (ref 3.5–5.1)
Total Bilirubin: 0.2 mg/dL — ABNORMAL LOW (ref 0.3–1.2)

## 2011-02-02 LAB — DIFFERENTIAL
Basophils Relative: 0 % (ref 0–1)
Eosinophils Relative: 0 % (ref 0–5)
Lymphocytes Relative: 2 % — ABNORMAL LOW (ref 12–46)
Lymphs Abs: 0.5 10*3/uL — ABNORMAL LOW (ref 0.7–4.0)
Monocytes Relative: 6 % (ref 3–12)
Neutro Abs: 21.2 10*3/uL — ABNORMAL HIGH (ref 1.7–7.7)

## 2011-02-02 LAB — RAPID URINE DRUG SCREEN, HOSP PERFORMED: Opiates: POSITIVE — AB

## 2011-02-02 LAB — PREPARE RBC (CROSSMATCH)

## 2011-02-02 LAB — URINE MICROSCOPIC-ADD ON

## 2011-02-02 LAB — RETICULOCYTES
RBC.: 2.9 MIL/uL — ABNORMAL LOW (ref 4.22–5.81)
Retic Count, Absolute: 69.6 10*3/uL (ref 19.0–186.0)
Retic Ct Pct: 2.4 % (ref 0.4–3.1)

## 2011-02-02 LAB — URINALYSIS, ROUTINE W REFLEX MICROSCOPIC
Bilirubin Urine: NEGATIVE
Leukocytes, UA: NEGATIVE
Nitrite: POSITIVE — AB
Specific Gravity, Urine: 1.019 (ref 1.005–1.030)
pH: 6 (ref 5.0–8.0)

## 2011-02-02 LAB — ABO/RH: ABO/RH(D): O NEG

## 2011-02-02 LAB — CARDIAC PANEL(CRET KIN+CKTOT+MB+TROPI): Total CK: 161 U/L (ref 7–232)

## 2011-02-02 LAB — AMMONIA: Ammonia: <10 umol/L — ABNORMAL LOW (ref 11–60)

## 2011-02-02 LAB — PRO B NATRIURETIC PEPTIDE: Pro B Natriuretic peptide (BNP): 2075 pg/mL — ABNORMAL HIGH (ref 0–450)

## 2011-02-02 MED ORDER — LORAZEPAM 1 MG PO TABS
1.0000 mg | ORAL_TABLET | Freq: Four times a day (QID) | ORAL | Status: AC | PRN
  Administered 2011-02-04: 1 mg via ORAL
  Filled 2011-02-02: qty 1

## 2011-02-02 MED ORDER — VITAMIN B-1 100 MG PO TABS
100.0000 mg | ORAL_TABLET | Freq: Every day | ORAL | Status: DC
  Administered 2011-02-04 – 2011-02-06 (×3): 100 mg via ORAL
  Filled 2011-02-02 (×4): qty 1

## 2011-02-02 MED ORDER — SODIUM CHLORIDE 0.9 % IV SOLN
1.0000 g | Freq: Three times a day (TID) | INTRAVENOUS | Status: DC
  Administered 2011-02-02 – 2011-02-03 (×3): 1 g via INTRAVENOUS
  Filled 2011-02-02 (×4): qty 1

## 2011-02-02 MED ORDER — VANCOMYCIN HCL IN DEXTROSE 1-5 GM/200ML-% IV SOLN
1000.0000 mg | INTRAVENOUS | Status: AC
  Administered 2011-02-02: 1000 mg via INTRAVENOUS
  Filled 2011-02-02: qty 200

## 2011-02-02 MED ORDER — LORAZEPAM 2 MG/ML IJ SOLN: 1.0000 mg | Freq: Four times a day (QID) | INTRAMUSCULAR | Status: AC | PRN

## 2011-02-02 MED ORDER — MORPHINE SULFATE 4 MG/ML IJ SOLN
4.0000 mg | Freq: Once | INTRAMUSCULAR | Status: AC
  Administered 2011-02-02: 4 mg via INTRAVENOUS
  Filled 2011-02-02: qty 1

## 2011-02-02 MED ORDER — ADULT MULTIVITAMIN W/MINERALS CH
1.0000 | ORAL_TABLET | Freq: Every day | ORAL | Status: DC
  Administered 2011-02-03 – 2011-02-06 (×4): 1 via ORAL
  Filled 2011-02-02 (×4): qty 1

## 2011-02-02 MED ORDER — SODIUM CHLORIDE 0.9 % IV BOLUS (SEPSIS)
500.0000 mL | Freq: Once | INTRAVENOUS | Status: AC
  Administered 2011-02-02: 500 mL via INTRAVENOUS

## 2011-02-02 MED ORDER — PANTOPRAZOLE SODIUM 40 MG IV SOLR
40.0000 mg | INTRAVENOUS | Status: DC
  Administered 2011-02-02 – 2011-02-03 (×2): 40 mg via INTRAVENOUS
  Filled 2011-02-02 (×3): qty 40

## 2011-02-02 MED ORDER — LEVOFLOXACIN IN D5W 750 MG/150ML IV SOLN
750.0000 mg | INTRAVENOUS | Status: DC
  Administered 2011-02-02 – 2011-02-05 (×4): 750 mg via INTRAVENOUS
  Filled 2011-02-02 (×4): qty 150

## 2011-02-02 MED ORDER — THIAMINE HCL 100 MG/ML IJ SOLN
100.0000 mg | Freq: Every day | INTRAMUSCULAR | Status: DC
  Administered 2011-02-03: 100 mg via INTRAVENOUS
  Filled 2011-02-02 (×4): qty 2

## 2011-02-02 MED ORDER — FOLIC ACID 1 MG PO TABS
1.0000 mg | ORAL_TABLET | Freq: Every day | ORAL | Status: DC
  Administered 2011-02-03 – 2011-02-06 (×4): 1 mg via ORAL
  Filled 2011-02-02 (×4): qty 1

## 2011-02-02 MED ORDER — SODIUM CHLORIDE 0.9 % IV SOLN
Freq: Once | INTRAVENOUS | Status: AC
  Administered 2011-02-02: 15:00:00 via INTRAVENOUS

## 2011-02-02 MED ORDER — SODIUM CHLORIDE 0.9 % IV BOLUS (SEPSIS)
1000.0000 mL | Freq: Once | INTRAVENOUS | Status: AC
  Administered 2011-02-02: 1000 mL via INTRAVENOUS

## 2011-02-02 NOTE — ED Notes (Signed)
EAV:[id number]> Expected date:02/02/11<BR> Expected time:12:27 PM<BR> Means of arrival:Ambulance<BR> Comments:<BR> GC M61. 77 yo m. Homeless. Outside all night. Cold exposure. Tympanic temp 91. Stable vitals 10 min eta

## 2011-02-02 NOTE — ED Provider Notes (Signed)
History     CSN: [id number]  Arrival date & time 02/02/11  1233   First MD Initiated Contact with Patient 02/02/11 1252      Chief Complaint  Patient presents with  . Shortness of Breath  . Back Pain  . Leg Pain    thighs    (Consider location/radiation/quality/duration/timing/severity/associated sxs/prior treatment) HPI  [name] history of alcohol abuse, COPD, chronic back pain, homeless presents with multiple complaints. The patient states that he has been falling more than usual recently. He states that his last fall was this morning. He states he does not remember the events surrounding the fall or he hit his head or have loss of consciousness. He complains of mild headache without dizziness, nausea, vomiting. He denies neck pain but states that his chronic back pain which is in the lumbar spine is worse than usual. He has pain radiating down his legs. He denies numbness, tingling, weakness of his extremities. He remains ambulatory. he does complain of pain to his anterior thighs bilaterally. He states that they're cold and that he's been out all night sleeping in a tent. he denies abdominal pain, Denies hematuria/dysuria/freq/urgency. He does complain of shortness of breath since awakening this morning. He denies chest pain. Denies h/o VTE in self or family. No recent hosp/surg/immob. No h/o cancer. Denies exogenous hormone use, no leg pain or swelling.   Past Medical History  Diagnosis Date  . COPD (chronic obstructive pulmonary disease)   . Chronic back pain     Past Surgical History  Procedure Date  . Tonsillectomy     History reviewed. No pertinent family history.  History  Substance Use Topics  . Smoking status: Current Some Day Smoker -- 1.0 packs/day    Types: Cigarettes  . Smokeless tobacco: Not on file  . Alcohol Use: Yes     174 ml of scoth weekly    Review of Systems  All other systems reviewed and are negative.   except as noted HPI  Allergies    Penicillins  Home Medications  No current outpatient prescriptions on file.  BP 98/58  Pulse 86  Temp(Src) 98.5 F (36.9 C) (Oral)  Resp 21  Ht 5\' 7"  (1.702 m)  Wt 130 lb (58.968 kg)  BMI 20.36 kg/m2  SpO2 94%  Physical Exam  Nursing note and vitals reviewed. Constitutional: He is oriented to person, place, and time. He appears well-developed and well-nourished. No distress.  HENT:  Head: Atraumatic.  Mouth/Throat: Oropharynx is clear and moist.  Eyes: Conjunctivae are normal. Pupils are equal, round, and reactive to light.  Neck: Neck supple.  Cardiovascular: Normal rate, regular rhythm, normal heart sounds and intact distal pulses.  Exam reveals no gallop and no friction rub.   No murmur heard. Pulmonary/Chest: Effort normal. No respiratory distress. He has no wheezes. He has rales.       Bibasilar rales Able to speak in complete sentences, not tachypneic  Abdominal: Soft. Bowel sounds are normal. There is no tenderness. There is no rebound and no guarding.  Genitourinary: Penis normal.       Perineum unremarkable  Musculoskeletal: Normal range of motion. He exhibits no edema and no tenderness.       Min ttp b/l anterior thighs  No midline c/t/l/s ttp R paraspinal lumbar ttp  Neurological: He is alert and oriented to person, place, and time. No cranial nerve deficit. He exhibits normal muscle tone. Coordination normal.       Strength 5/5 all extremities No  facial droop   Skin: Skin is warm and dry.  Psychiatric: He has a normal mood and affect.    Date: 02/02/2011  Rate: 90  Rhythm: normal sinus rhythm  QRS Axis: normal  Intervals: normal  ST/T Wave abnormalities: nonspecific ST/T changes  Conduction Disutrbances:nonspecific intraventricular conduction delay  Narrative Interpretation: baseline artifact all leads  Old EKG Reviewed: changes noted   ED Course  Procedures (including critical care time)  CRITICAL CARE Performed by: Forbes Cellar   Total  critical care time:  Critical care time was exclusive of separately billable procedures and treating other patients.  Critical care was necessary to treat or prevent imminent or life-threatening deterioration.  Critical care was time spent personally by me on the following activities: development of treatment plan with patient and/or surrogate as well as nursing, discussions with consultants, evaluation of patient's response to treatment, examination of patient, obtaining history from patient or surrogate, ordering and performing treatments and interventions, ordering and review of laboratory studies, ordering and review of radiographic studies, pulse oximetry and re-evaluation of patient's condition.   Labs Reviewed  CBC - Abnormal; Notable for the following:    WBC 23.1 (*)    RBC 3.19 (*)    Hemoglobin 7.9 (*)    HCT 25.3 (*)    MCH 24.8 (*)    Platelets 491 (*)    All other components within normal limits  DIFFERENTIAL - Abnormal; Notable for the following:    Neutrophils Relative 92 (*)    Lymphocytes Relative 2 (*)    Neutro Abs 21.2 (*)    Lymphs Abs 0.5 (*)    Monocytes Absolute 1.4 (*)    All other components within normal limits  COMPREHENSIVE METABOLIC PANEL - Abnormal; Notable for the following:    Glucose, Bld 110 (*)    BUN 27 (*)    Albumin 3.1 (*)    Alkaline Phosphatase 118 (*)    Total Bilirubin 0.2 (*)    GFR calc non Af Amer 69 (*)    GFR calc Af Amer 80 (*)    All other components within normal limits  CARDIAC PANEL(CRET KIN+CKTOT+MB+TROPI) - Abnormal; Notable for the following:    CK, MB 6.8 (*)    Relative Index 4.2 (*)    All other components within normal limits  PRO B NATRIURETIC PEPTIDE - Abnormal; Notable for the following:    Pro B Natriuretic peptide (BNP) 2075.0 (*)    All other components within normal limits  LACTIC ACID, PLASMA - Abnormal; Notable for the following:    Lactic Acid, Venous 3.6 (*)    All other components within normal  limits  URINALYSIS, ROUTINE W REFLEX MICROSCOPIC - Abnormal; Notable for the following:    APPearance CLOUDY (*)    Protein, ur 30 (*)    Nitrite POSITIVE (*)    All other components within normal limits  URINE MICROSCOPIC-ADD ON - Abnormal; Notable for the following:    Bacteria, UA MANY (*)    Casts HYALINE CASTS (*)    All other components within normal limits  CULTURE, BLOOD (ROUTINE X 2)  CULTURE, BLOOD (ROUTINE X 2)  URINE CULTURE   Dg Chest 2 View  02/02/2011  *RADIOLOGY REPORT*  Clinical Data: Chronic back pain.  Fall.  CHEST - 2 VIEW  Comparison: Chest radiograph 10/09/2010.  Findings: There is diffuse bilateral airspace disease of the perihilar and basilar predominant distribution.  This is most compatible with pulmonary edema and mild CHF.  Cardiomegaly is present.  Aortic arch atherosclerosis.  Multi focal pneumonia is another differential consideration however the symmetry favors CHF. Healed fracture of the left clavicle.  No pleural effusion is identified.  Radiographic follow-up is recommended to ensure clearing and exclude an underlying lesion.  IMPRESSION: Bilateral basilar predominant airspace disease and cardiomegaly are most compatible with mild CHF.  Multifocal pneumonia given secondary consideration.  Original Report Authenticated By: Andreas Newport, M.D.   Dg Lumbar Spine Complete  02/02/2011  *RADIOLOGY REPORT*  Clinical Data: Back pain.  LUMBAR SPINE - COMPLETE 4+ VIEW  Comparison: 03/02/2010.  Findings: Chronic L2 compression fractures present with slightly greater than 50% loss of vertebral body height.  The alignment and compression fracture appear unchanged compared to the prior exam. Abdominal aortic aneurysm is present.  Aortoiliac atherosclerosis is present.  No new lumbar compression fracture is identified. Lower lumbar facet arthrosis.  Osteopenia.  Trace anterolisthesis of L4 on L5 is present.  Sacrum appears intact.  Per CMS PQRS reporting requirements (PQRS  Measure 24): Given the patient's age of greater than 50 and the fracture site (hip, distal radius, or spine), the patient should be tested for osteoporosis using DXA, and the appropriate treatment considered based on the DXA results.  IMPRESSION: Chronic L2 compression fracture.  No acute osseous abnormality.  Original Report Authenticated By: Andreas Newport, M.D.   Dg Pelvis 1-2 Views  02/02/2011  *RADIOLOGY REPORT*  Clinical Data: Fall.  Pelvic pain.  PELVIS - 1-2 VIEW  Comparison: None.  Findings: Pelvic rings appear intact.  The patient is slightly oblique.  Atherosclerosis is noted.  Sacral arcades are obscured by overlying bowel gas.  Hip joint spaces appear symmetric. Osteopenia.  IMPRESSION: No acute osseous abnormality.  Original Report Authenticated By: Andreas Newport, M.D.   Ct Head Wo Contrast  02/02/2011  *RADIOLOGY REPORT*  Clinical Data: Short of breath, back pain and leg pain  CT HEAD WITHOUT CONTRAST  Technique:  Contiguous axial images were obtained from the base of the skull through the vertex without contrast.  Comparison: CT 10/31/2010  Findings: No acute intracranial hemorrhage.  No focal mass lesion. No CT evidence of acute infarction.   No midline shift or mass effect.  No hydrocephalus.  Basilar cisterns are patent.  There is generalized cortical atrophy and periventricular white matter hypodensities unchanged from prior. Paranasal sinuses and mastoid air cells are clear.  Orbits are normal.  IMPRESSION:  1.  No acute intracranial findings. 2.  Atrophy and microvascular disease. 3.  No change from prior.  Original Report Authenticated By: Genevive Bi, M.D.     1. Pneumonia   2. SIRS (systemic inflammatory response syndrome)   3. Hypothermia   4. UTI (lower urinary tract infection)     MDM  PW multiple complaints. Found to have PNA by CXR and UTI as well. Hypothermic. Bear hugger in place. Broad spectrum abx. PCN allergy. Patient does not appear to be fluid overloaded,  clinically CHF less likely.  Transient hypotension, improved with IVF. SIRS/early sepsis. Lactate elevated. Patient appears well. D/W Critical care on call. Recommend hospitalist admit. D/W Hospitalist. Pt admitted to [facility]- Stepdown.        Forbes Cellar, MD 02/02/11 [id number]

## 2011-02-02 NOTE — H&P (Signed)
PCP:   No primary provider on file.   Chief Complaint:  "I threw up coffee colored material since yestrday after drinking a bottle of scotch"  HPI: Dale Mcfarland is a 77 year old gentleman, apparently alcoholic, just came out of prison yesterday where he spent 45 days for?, "lives in a tent with a bunch of guys" . He came in today with complaints of vomiting coffee ground material after drinking the scotch. His history is rather confusing but he also complaints of sob, denies fever, no diarrhea. Admits to some abdominal pain. He has used some ibuprofen for pain recently. He also has pleuritic right sided chest pain which he attributes to a remote hx of broken rib. He was found to be hypothermic-94, hypotensive- 78 systolic, acidotic- lactic acid 3.6, to have cxr suggesting PNA, wbc 23,000. Hb 7.9(baseline 9.5 in september). Patient last had vomiting this morning. He has been given vanc/meropenem/levaquin, ivf bolus(1.5liters) with improvement in BP and temp and referred to hospitalist service for further management.  Review of Systems:  Unremarkable except as highlighted above. Past Medical History: Past Medical History  Diagnosis Date  . COPD (chronic obstructive pulmonary disease)   . Chronic back pain    Past Surgical History  Procedure Date  . Tonsillectomy     Medications: Prior to Admission medications   Not on File    Allergies:   Allergies  Allergen Reactions  . Penicillins Itching    Social History:  reports that he has been smoking Cigarettes.  He has been smoking about 1 pack per day. He does not have any smokeless tobacco history on file. He reports that he drinks alcohol. His drug history not on file.  Family History: History reviewed. No pertinent family history.  Physical Exam: Filed Vitals:   02/02/11 1414 02/02/11 1423 02/02/11 1432 02/02/11 1644  BP: 78/52  98/58 159/78  Pulse: 86   102  Temp: 98.1 F (36.7 C) 98.5 F (36.9 C)  98.2 F (36.8 C)  TempSrc:  Oral Oral  Oral  Resp:      Height:      Weight:      SpO2: 94%   99%   General appearance: alert, cooperative and appears older than stated age Head: Normocephalic, without obvious abnormality, atraumatic Eyes: conjunctivae/corneas clear. PERRL, EOM's intact. Fundi benign. Throat: lips, mucosa, and tongue normal; teeth and gums normal Resp:scattered rhonchi bilaterally. Cardio: regular rate and rhythm, S1, S2 normal, no murmur, click, rub or gallop GI: soft, non-tender; bowel sounds normal; no masses,  no organomegaly Extremities: extremities normal, atraumatic, no cyanosis or edema Pulses: 2+ and symmetric Neurologic: Grossly normal   Labs on Admission:   Basename 02/02/11 1400  NA 135  K 4.7  CL 97  CO2 22  GLUCOSE 110*  BUN 27*  CREATININE 1.00  CALCIUM 8.9  MG --  PHOS --    Basename 02/02/11 1400  AST 16  ALT 12  ALKPHOS 118*  BILITOT 0.2*  PROT 6.7  ALBUMIN 3.1*   No results found for this basename: LIPASE:2,AMYLASE:2 in the last 72 hours  Basename 02/02/11 1400  WBC 23.1*  NEUTROABS 21.2*  HGB 7.9*  HCT 25.3*  MCV 79.3  PLT 491*    Basename 02/02/11 1400  CKTOTAL 161  CKMB 6.8*  CKMBINDEX --  TROPONINI <0.30   No results found for this basename: TSH,T4TOTAL,FREET3,T3FREE,THYROIDAB in the last 72 hours No results found for this basename: VITAMINB12:2,FOLATE:2,FERRITIN:2,TIBC:2,IRON:2,RETICCTPCT:2 in the last 72 hours  Radiological Exams on  Admission: Dg Chest 2 View  02/02/2011  *RADIOLOGY REPORT*  Clinical Data: Chronic back pain.  Fall.  CHEST - 2 VIEW  Comparison: Chest radiograph 10/09/2010.  Findings: There is diffuse bilateral airspace disease of the perihilar and basilar predominant distribution.  This is most compatible with pulmonary edema and mild CHF.  Cardiomegaly is present.  Aortic arch atherosclerosis.  Multi focal pneumonia is another differential consideration however the symmetry favors CHF. Healed fracture of the left clavicle.   No pleural effusion is identified.  Radiographic follow-up is recommended to ensure clearing and exclude an underlying lesion.  IMPRESSION: Bilateral basilar predominant airspace disease and cardiomegaly are most compatible with mild CHF.  Multifocal pneumonia given secondary consideration.  Original Report Authenticated By: Andreas Newport, M.D.   Dg Lumbar Spine Complete  02/02/2011  *RADIOLOGY REPORT*  Clinical Data: Back pain.  LUMBAR SPINE - COMPLETE 4+ VIEW  Comparison: 03/02/2010.  Findings: Chronic L2 compression fractures present with slightly greater than 50% loss of vertebral body height.  The alignment and compression fracture appear unchanged compared to the prior exam. Abdominal aortic aneurysm is present.  Aortoiliac atherosclerosis is present.  No new lumbar compression fracture is identified. Lower lumbar facet arthrosis.  Osteopenia.  Trace anterolisthesis of L4 on L5 is present.  Sacrum appears intact.  Per CMS PQRS reporting requirements (PQRS Measure 24): Given the patient's age of greater than 50 and the fracture site (hip, distal radius, or spine), the patient should be tested for osteoporosis using DXA, and the appropriate treatment considered based on the DXA results.  IMPRESSION: Chronic L2 compression fracture.  No acute osseous abnormality.  Original Report Authenticated By: Andreas Newport, M.D.   Dg Pelvis 1-2 Views  02/02/2011  *RADIOLOGY REPORT*  Clinical Data: Fall.  Pelvic pain.  PELVIS - 1-2 VIEW  Comparison: None.  Findings: Pelvic rings appear intact.  The patient is slightly oblique.  Atherosclerosis is noted.  Sacral arcades are obscured by overlying bowel gas.  Hip joint spaces appear symmetric. Osteopenia.  IMPRESSION: No acute osseous abnormality.  Original Report Authenticated By: Andreas Newport, M.D.   Ct Head Wo Contrast  02/02/2011  *RADIOLOGY REPORT*  Clinical Data: Short of breath, back pain and leg pain  CT HEAD WITHOUT CONTRAST  Technique:  Contiguous  axial images were obtained from the base of the skull through the vertex without contrast.  Comparison: CT 10/31/2010  Findings: No acute intracranial hemorrhage.  No focal mass lesion. No CT evidence of acute infarction.   No midline shift or mass effect.  No hydrocephalus.  Basilar cisterns are patent.  There is generalized cortical atrophy and periventricular white matter hypodensities unchanged from prior. Paranasal sinuses and mastoid air cells are clear.  Orbits are normal.  IMPRESSION:  1.  No acute intracranial findings. 2.  Atrophy and microvascular disease. 3.  No change from prior.  Original Report Authenticated By: Genevive Bi, M.D.    Assessment 77 year old alcoholic male coming in with coffee ground emesis after binch drink, probably aspirated -?PUD vs MWT. May have aspirated. May just have CAP.  He has acute on chronic blood loss anemia. He has stabilized with interventions in ED.  Plan .Pneumonia- Admit. Agree with abx started in ED. Follow septic work up. Marland KitchenUpper GI bleeding- seems hemodynamically stable. Avoid NSAIDS. PPI. Fredia Beets discussed with Dr Loreta Ave who will see patient in am and is available if patient needs emergent endoscopy in the night. .Alcoholism- CIWA protocol. Check magnesium and replenish as necessary. Folate/thiamine. .Leukocytosis-  likely due to pna. .Anemia associated with acute blood loss- transfuse 2 units prbc. dvt prophylaxis- scds Disposition- stable for admission to med/surg floor as hemodynamically stable and no evidence of active bleeding.  Venola Castello 02/02/2011, 6:07 PM

## 2011-02-02 NOTE — ED Notes (Signed)
Pt brought in by ems with c/o sob/ chronic back pain/thigh pain, pt is homeless and was picked up by ems from a homeless camp site. Pt was released from jail yesterday afternoon after a 2 week stay.

## 2011-02-03 ENCOUNTER — Encounter (HOSPITAL_COMMUNITY)
Admission: EM | Disposition: A | Discharge status: Home / Self Care | Payer: Self-pay | Source: Home / Self Care | Attending: Internal Medicine

## 2011-02-03 ENCOUNTER — Encounter (HOSPITAL_COMMUNITY): Payer: Self-pay | Admitting: *Deleted

## 2011-02-03 ENCOUNTER — Other Ambulatory Visit: Payer: Self-pay | Admitting: Gastroenterology

## 2011-02-03 HISTORY — PX: ESOPHAGOGASTRODUODENOSCOPY: SHX5428

## 2011-02-03 LAB — BASIC METABOLIC PANEL
CO2: 22 mEq/L (ref 19–32)
Calcium: 8.3 mg/dL — ABNORMAL LOW (ref 8.4–10.5)
Creatinine, Ser: 0.94 mg/dL (ref 0.50–1.35)
Glucose, Bld: 89 mg/dL (ref 70–99)

## 2011-02-03 LAB — MAGNESIUM: Magnesium: 1.8 mg/dL (ref 1.5–2.5)

## 2011-02-03 LAB — CBC
Hemoglobin: 8.6 g/dL — ABNORMAL LOW (ref 13.0–17.0)
MCH: 26.5 pg (ref 26.0–34.0)
MCV: 79.7 fL (ref 78.0–100.0)
RBC: 3.25 MIL/uL — ABNORMAL LOW (ref 4.22–5.81)

## 2011-02-03 LAB — VITAMIN B12: Vitamin B-12: 253 pg/mL (ref 211–911)

## 2011-02-03 LAB — TSH: TSH: 1.387 u[IU]/mL (ref 0.350–4.500)

## 2011-02-03 LAB — FOLATE: Folate: 12 ng/mL

## 2011-02-03 LAB — PHOSPHORUS: Phosphorus: 1.9 mg/dL — ABNORMAL LOW (ref 2.3–4.6)

## 2011-02-03 LAB — PROTIME-INR: Prothrombin Time: 15.3 seconds — ABNORMAL HIGH (ref 11.6–15.2)

## 2011-02-03 LAB — APTT: aPTT: 39 seconds — ABNORMAL HIGH (ref 24–37)

## 2011-02-03 SURGERY — EGD (ESOPHAGOGASTRODUODENOSCOPY)
Anesthesia: Moderate Sedation

## 2011-02-03 MED ORDER — FOLIC ACID 1 MG PO TABS
1.0000 mg | ORAL_TABLET | Freq: Every day | ORAL | Status: DC
  Filled 2011-02-03: qty 1

## 2011-02-03 MED ORDER — BUTAMBEN-TETRACAINE-BENZOCAINE 2-2-14 % EX AERO
INHALATION_SPRAY | CUTANEOUS | Status: DC | PRN
  Administered 2011-02-03: 2 via TOPICAL

## 2011-02-03 MED ORDER — MIDAZOLAM HCL 10 MG/2ML IJ SOLN
INTRAMUSCULAR | Status: AC
  Filled 2011-02-03: qty 4

## 2011-02-03 MED ORDER — GUAIFENESIN ER 600 MG PO TB12
600.0000 mg | ORAL_TABLET | Freq: Two times a day (BID) | ORAL | Status: DC
  Administered 2011-02-03 – 2011-02-06 (×7): 600 mg via ORAL
  Filled 2011-02-03 (×8): qty 1

## 2011-02-03 MED ORDER — ONDANSETRON HCL 4 MG PO TABS: 4.0000 mg | ORAL_TABLET | Freq: Four times a day (QID) | ORAL | Status: DC | PRN

## 2011-02-03 MED ORDER — MIDAZOLAM HCL 10 MG/2ML IJ SOLN
INTRAMUSCULAR | Status: DC | PRN
  Administered 2011-02-03: 1 mg via INTRAVENOUS
  Administered 2011-02-03 (×2): 2 mg via INTRAVENOUS

## 2011-02-03 MED ORDER — MORPHINE SULFATE 2 MG/ML IJ SOLN: 1.0000 mg | INTRAMUSCULAR | Status: DC | PRN

## 2011-02-03 MED ORDER — ALBUTEROL SULFATE (5 MG/ML) 0.5% IN NEBU: 2.5000 mg | INHALATION_SOLUTION | Freq: Four times a day (QID) | RESPIRATORY_TRACT | Status: DC | PRN

## 2011-02-03 MED ORDER — ACETAMINOPHEN 650 MG RE SUPP: 650.0000 mg | Freq: Four times a day (QID) | RECTAL | Status: DC | PRN

## 2011-02-03 MED ORDER — DOCUSATE SODIUM 100 MG PO CAPS
100.0000 mg | ORAL_CAPSULE | Freq: Two times a day (BID) | ORAL | Status: DC
  Administered 2011-02-03 – 2011-02-06 (×7): 100 mg via ORAL
  Filled 2011-02-03 (×8): qty 1

## 2011-02-03 MED ORDER — VANCOMYCIN HCL 500 MG IV SOLR
500.0000 mg | Freq: Two times a day (BID) | INTRAVENOUS | Status: DC
  Administered 2011-02-03 – 2011-02-04 (×4): 500 mg via INTRAVENOUS
  Filled 2011-02-03 (×5): qty 500

## 2011-02-03 MED ORDER — ALBUTEROL SULFATE (5 MG/ML) 0.5% IN NEBU
2.5000 mg | INHALATION_SOLUTION | Freq: Four times a day (QID) | RESPIRATORY_TRACT | Status: DC
  Administered 2011-02-03 (×2): 2.5 mg via RESPIRATORY_TRACT
  Filled 2011-02-03 (×3): qty 0.5

## 2011-02-03 MED ORDER — FENTANYL CITRATE 0.05 MG/ML IJ SOLN
INTRAMUSCULAR | Status: AC
  Filled 2011-02-03: qty 4

## 2011-02-03 MED ORDER — ACETAMINOPHEN 325 MG PO TABS: 650.0000 mg | ORAL_TABLET | Freq: Four times a day (QID) | ORAL | Status: DC | PRN

## 2011-02-03 MED ORDER — VITAMIN B-1 100 MG PO TABS
100.0000 mg | ORAL_TABLET | Freq: Every day | ORAL | Status: DC
  Filled 2011-02-03: qty 1

## 2011-02-03 MED ORDER — ADULT MULTIVITAMIN W/MINERALS CH
1.0000 | ORAL_TABLET | Freq: Every day | ORAL | Status: DC
  Filled 2011-02-03: qty 1

## 2011-02-03 MED ORDER — FENTANYL NICU IV SYRINGE 50 MCG/ML
INJECTION | INTRAMUSCULAR | Status: DC | PRN
  Administered 2011-02-03 (×2): 25 ug via INTRAVENOUS

## 2011-02-03 MED ORDER — POTASSIUM CHLORIDE IN NACL 20-0.9 MEQ/L-% IV SOLN
INTRAVENOUS | Status: DC
  Administered 2011-02-03 (×2): 1000 mL via INTRAVENOUS
  Administered 2011-02-05: 05:00:00 via INTRAVENOUS
  Filled 2011-02-03 (×5): qty 1000

## 2011-02-03 MED ORDER — ONDANSETRON HCL 4 MG/2ML IJ SOLN: 4.0000 mg | Freq: Four times a day (QID) | INTRAMUSCULAR | Status: DC | PRN

## 2011-02-03 NOTE — Progress Notes (Signed)
ANTIBIOTIC CONSULT NOTE - INITIAL  Pharmacy Consult for Vancomycin Indication: Pneumonia  Allergies  Allergen Reactions  . Penicillins Itching    Patient Measurements: Height: 5\' 7"  (170.2 cm) Weight: 130 lb (58.968 kg) IBW/kg (Calculated) : 66.1     Vital Signs: Temp: 98.9 F (37.2 C) (12/23 0323) Temp src: Oral (12/23 0323) BP: 132/73 mmHg (12/23 0323) Pulse Rate: 99  (12/23 0323) Intake/Output from previous day: 12/22 0701 - 12/23 0700 In: 650 [I.V.:150; Blood:500] Out: -  Intake/Output from this shift: Total I/O In: 650 [I.V.:150; Blood:500] Out: -   Labs:  Basename 02/02/11 1400  WBC 23.1*  HGB 7.9*  PLT 491*  LABCREA --  CREATININE 1.00   Estimated Creatinine Clearance: 50 ml/min (by C-G formula based on Cr of 1). No results found for this basename: VANCOTROUGH:2,VANCOPEAK:2,VANCORANDOM:2,GENTTROUGH:2,GENTPEAK:2,GENTRANDOM:2,TOBRATROUGH:2,TOBRAPEAK:2,TOBRARND:2,AMIKACINPEAK:2,AMIKACINTROU:2,AMIKACIN:2, in the last 72 hours   Microbiology: No results found for this or any previous visit (from the past 720 hour(s)).  Medical History: Past Medical History  Diagnosis Date  . COPD (chronic obstructive pulmonary disease)   . Chronic back pain     Medications:  Scheduled:    . sodium chloride   Intravenous Once  . albuterol  2.5 mg Nebulization Q6H  . docusate sodium  100 mg Oral BID  . folic acid  1 mg Oral Daily  . guaiFENesin  600 mg Oral BID  . levofloxacin (LEVAQUIN) IV  750 mg Intravenous Q24H  . meropenem (MERREM) IV  1 g Intravenous Q8H  .  morphine injection  4 mg Intravenous Once  . mulitivitamin with minerals  1 tablet Oral Daily  . pantoprazole (PROTONIX) IV  40 mg Intravenous Q24H  . sodium chloride  1,000 mL Intravenous Once  . sodium chloride  500 mL Intravenous Once  . thiamine  100 mg Oral Daily   Or  . thiamine  100 mg Intravenous Daily  . vancomycin  1,000 mg Intravenous To ER  . DISCONTD: folic acid  1 mg Oral Daily  .  DISCONTD: mulitivitamin with minerals  1 tablet Oral Daily  . DISCONTD: thiamine  100 mg Oral Daily   Infusions:    . 0.9 % NaCl with KCl 20 mEq / L     Assessment: 77 year old male to be started on Vancomycin (per pharmacy), Meropenem and Levaquin for pneumonia.  Pt's chief complaint is coffe-ground colored emesis s/p binge drinking.  Pt received Vancomycin 1gm IV x 1 in ED @ 18:30 on 02/02/11. CrCl = 50 ml/min  Goal of Therapy:  Vancomycin trough level 15-20 mcg/ml  Plan:  1.  Vancomycin 500mg  IV q12h 2.  Check vancomycin trough level as needed.  Mindi Akerson Trefz 02/03/2011,5:18 AM

## 2011-02-03 NOTE — Consult Note (Signed)
Reason for Consult:Coffee ground emesis.  Referring Physician: [name] is an 77 y.o. male.  HPI: Mr. Dale Mcfarland is a 77 year old alcoholic admitted with coffee ground emesis and shortness of breath. He claims his GI symptoms have completely resolved. He drinks heavily on a daily basis. He denies any melena or hematochezia. He has had problems with constipation off and on. He denies a previous history of ulcers, jaundice or colitis.  Past Medical History  Diagnosis Date  . COPD (chronic obstructive pulmonary disease)   . Chronic back pain   . Pneumonia   . Depression   . Blood transfusion    Past Surgical History  Procedure Date  . Tonsillectomy   . Vasectomy   . Appendectomy   . Tonsillectomy   . Colonoscopy   . Esophagogastroduodenoscopy    History reviewed. No pertinent family history.  Social History:  reports that he has been smoking Cigarettes.  He has been smoking about 1 pack per day. He does not have any smokeless tobacco history on file. He reports that he drinks alcohol. He reports that he does not use illicit drugs.  Allergies:  Allergies  Allergen Reactions  . Penicillins Itching    Medications: I have reviewed the patient's current medications.  Results for orders placed during the hospital encounter of 02/02/11 (from the past 48 hour(s))  CBC     Status: Abnormal   Collection Time   02/02/11  2:00 PM      Component Value Range Comment   WBC 23.1 (*) 4.0 - 10.5 (K/uL)    RBC 3.19 (*) 4.22 - 5.81 (MIL/uL)    Hemoglobin 7.9 (*) 13.0 - 17.0 (g/dL)    HCT 19.1 (*) 47.8 - 52.0 (%)    MCV 79.3  78.0 - 100.0 (fL)    MCH 24.8 (*) 26.0 - 34.0 (pg)    MCHC 31.2  30.0 - 36.0 (g/dL)    RDW 29.5  62.1 - 30.8 (%)    Platelets 491 (*) 150 - 400 (K/uL)   DIFFERENTIAL     Status: Abnormal   Collection Time   02/02/11  2:00 PM      Component Value Range Comment   Neutrophils Relative 92 (*) 43 - 77 (%)    Lymphocytes Relative 2 (*) 12 - 46 (%)    Monocytes Relative 6  3 - 12 (%)    Eosinophils Relative 0  0 - 5 (%)    Basophils Relative 0  0 - 1 (%)    Neutro Abs 21.2 (*) 1.7 - 7.7 (K/uL)    Lymphs Abs 0.5 (*) 0.7 - 4.0 (K/uL)    Monocytes Absolute 1.4 (*) 0.1 - 1.0 (K/uL)    Eosinophils Absolute 0.0  0.0 - 0.7 (K/uL)    Basophils Absolute 0.0  0.0 - 0.1 (K/uL)    WBC Morphology MILD LEFT SHIFT (1-5% METAS, OCC MYELO, OCC BANDS)     COMPREHENSIVE METABOLIC PANEL     Status: Abnormal   Collection Time   02/02/11  2:00 PM      Component Value Range Comment   Sodium 135  135 - 145 (mEq/L)    Potassium 4.7  3.5 - 5.1 (mEq/L)    Chloride 97  96 - 112 (mEq/L)    CO2 22  19 - 32 (mEq/L)    Glucose, Bld 110 (*) 70 - 99 (mg/dL)    BUN 27 (*) 6 - 23 (mg/dL)    Creatinine, Ser 6.57  0.50 - 1.35 (mg/dL)    Calcium 8.9  8.4 - 10.5 (mg/dL)    Total Protein 6.7  6.0 - 8.3 (g/dL)    Albumin 3.1 (*) 3.5 - 5.2 (g/dL)    AST 16  0 - 37 (U/L)    ALT 12  0 - 53 (U/L)    Alkaline Phosphatase 118 (*) 39 - 117 (U/L)    Total Bilirubin 0.2 (*) 0.3 - 1.2 (mg/dL)    GFR calc non Af Amer 69 (*) >90 (mL/min)    GFR calc Af Amer 80 (*) >90 (mL/min)   CARDIAC PANEL(CRET KIN+CKTOT+MB+TROPI)     Status: Abnormal   Collection Time   02/02/11  2:00 PM      Component Value Range Comment   Total CK 161  7 - 232 (U/L)    CK, MB 6.8 (*) 0.3 - 4.0 (ng/mL)    Troponin I <0.30  <0.30 (ng/mL)    Relative Index 4.2 (*) 0.0 - 2.5    PRO B NATRIURETIC PEPTIDE     Status: Abnormal   Collection Time   02/02/11  2:00 PM      Component Value Range Comment   Pro B Natriuretic peptide (BNP) 2075.0 (*) 0 - 450 (pg/mL)   CULTURE, BLOOD (ROUTINE X 2)     Status: Normal (Preliminary result)   Collection Time   02/02/11  2:27 PM      Component Value Range Comment   Specimen Description BLOOD RIGHT HAND      Special Requests BOTTLES DRAWN AEROBIC ONLY 3CC      Setup Time [id number]      Culture        Value:        BLOOD CULTURE RECEIVED NO GROWTH TO DATE CULTURE  WILL BE HELD FOR 5 DAYS BEFORE ISSUING A FINAL NEGATIVE REPORT   Report Status PENDING     CULTURE, BLOOD (ROUTINE X 2)     Status: Normal (Preliminary result)   Collection Time   02/02/11  2:30 PM      Component Value Range Comment   Specimen Description BLOOD RIGHT ARM      Special Requests BOTTLES DRAWN AEROBIC AND ANAEROBIC 6CC EACH      Setup Time [id number]      Culture        Value:        BLOOD CULTURE RECEIVED NO GROWTH TO DATE CULTURE WILL BE HELD FOR 5 DAYS BEFORE ISSUING A FINAL NEGATIVE REPORT   Report Status PENDING     LACTIC ACID, PLASMA     Status: Abnormal   Collection Time   02/02/11  2:35 PM      Component Value Range Comment   Lactic Acid, Venous 3.6 (*) 0.5 - 2.2 (mmol/L)   URINALYSIS, ROUTINE W REFLEX MICROSCOPIC     Status: Abnormal   Collection Time   02/02/11  3:26 PM      Component Value Range Comment   Color, Urine YELLOW  YELLOW     APPearance CLOUDY (*) CLEAR     Specific Gravity, Urine 1.019  1.005 - 1.030     pH 6.0  5.0 - 8.0     Glucose, UA NEGATIVE  NEGATIVE (mg/dL)    Hgb urine dipstick NEGATIVE  NEGATIVE     Bilirubin Urine NEGATIVE  NEGATIVE     Ketones, ur NEGATIVE  NEGATIVE (mg/dL)    Protein, ur 30 (*) NEGATIVE (mg/dL)    Urobilinogen, UA 0.2  0.0 - 1.0 (mg/dL)    Nitrite POSITIVE (*) NEGATIVE     Leukocytes, UA NEGATIVE  NEGATIVE    URINE MICROSCOPIC-ADD ON     Status: Abnormal   Collection Time   02/02/11  3:26 PM      Component Value Range Comment   Squamous Epithelial / LPF RARE  RARE     WBC, UA 0-2  <3 (WBC/hpf)    RBC / HPF 0-2  <3 (RBC/hpf)    Bacteria, UA MANY (*) RARE     Casts HYALINE CASTS (*) NEGATIVE     Urine-Other MUCOUS PRESENT     TYPE AND SCREEN     Status: Normal (Preliminary result)   Collection Time   02/02/11  7:40 PM      Component Value Range Comment   ABO/RH(D) O NEG      Antibody Screen NEG      Sample Expiration 02/05/2011      Unit Number [id number]      Blood Component Type RBC CPDA1, LR       Unit division 00      Status of Unit ISSUED,FINAL      Transfusion Status OK TO TRANSFUSE      Crossmatch Result Compatible      Unit Number [id number]      Blood Component Type RBC CPDA1, LR      Unit division 00      Status of Unit ISSUED      Transfusion Status OK TO TRANSFUSE      Crossmatch Result Compatible     PREPARE RBC (CROSSMATCH)     Status: Normal   Collection Time   02/02/11  7:40 PM      Component Value Range Comment   Order Confirmation ORDER PROCESSED BY BLOOD BANK     VITAMIN B12     Status: Normal   Collection Time   02/02/11  7:40 PM      Component Value Range Comment   Vitamin B-12 253  211 - 911 (pg/mL)   FOLATE     Status: Normal   Collection Time   02/02/11  7:40 PM      Component Value Range Comment   Folate 12.0     IRON AND TIBC     Status: Abnormal   Collection Time   02/02/11  7:40 PM      Component Value Range Comment   Iron 28 (*) 42 - 135 (ug/dL)    TIBC 782  956 - 213 (ug/dL)    Saturation Ratios 7 (*) 20 - 55 (%)    UIBC 393  125 - 400 (ug/dL)   FERRITIN     Status: Abnormal   Collection Time   02/02/11  7:40 PM      Component Value Range Comment   Ferritin 11 (*) 22 - 322 (ng/mL)   RETICULOCYTES     Status: Abnormal   Collection Time   02/02/11  7:40 PM      Component Value Range Comment   Retic Ct Pct 2.4  0.4 - 3.1 (%)    RBC. 2.90 (*) 4.22 - 5.81 (MIL/uL)    Retic Count, Manual 69.6  19.0 - 186.0 (K/uL)   ABO/RH     Status: Normal   Collection Time   02/02/11  7:40 PM      Component Value Range Comment   ABO/RH(D) O NEG     AMMONIA     Status: Abnormal   Collection Time  02/02/11  7:46 PM      Component Value Range Comment   Ammonia <10 (*) 11 - 60 (umol/L) REPEATED TO VERIFY  URINE RAPID DRUG SCREEN (HOSP PERFORMED)     Status: Abnormal   Collection Time   02/02/11  8:22 PM      Component Value Range Comment   Opiates POSITIVE (*) NONE DETECTED     Cocaine NONE DETECTED  NONE DETECTED     Benzodiazepines NONE DETECTED   NONE DETECTED     Amphetamines NONE DETECTED  NONE DETECTED     Tetrahydrocannabinol NONE DETECTED  NONE DETECTED     Barbiturates NONE DETECTED  NONE DETECTED    MAGNESIUM     Status: Normal   Collection Time   02/03/11  9:14 AM      Component Value Range Comment   Magnesium 1.8  1.5 - 2.5 (mg/dL)   PHOSPHORUS     Status: Abnormal   Collection Time   02/03/11  9:14 AM      Component Value Range Comment   Phosphorus 1.9 (*) 2.3 - 4.6 (mg/dL)   PROTIME-INR     Status: Abnormal   Collection Time   02/03/11  9:14 AM      Component Value Range Comment   Prothrombin Time 15.3 (*) 11.6 - 15.2 (seconds)    INR 1.18  0.00 - 1.49    TSH     Status: Normal   Collection Time   02/03/11  9:14 AM      Component Value Range Comment   TSH 1.387  0.350 - 4.500 (uIU/mL)   APTT     Status: Abnormal   Collection Time   02/03/11  9:14 AM      Component Value Range Comment   aPTT 39 (*) 24 - 37 (seconds)   CBC     Status: Abnormal   Collection Time   02/03/11  9:18 AM      Component Value Range Comment   WBC 21.7 (*) 4.0 - 10.5 (K/uL)    RBC 3.25 (*) 4.22 - 5.81 (MIL/uL)    Hemoglobin 8.6 (*) 13.0 - 17.0 (g/dL)    HCT 16.1 (*) 09.6 - 52.0 (%)    MCV 79.7  78.0 - 100.0 (fL)    MCH 26.5  26.0 - 34.0 (pg)    MCHC 33.2  30.0 - 36.0 (g/dL)    RDW 04.5 (*) 40.9 - 15.5 (%)    Platelets 360  150 - 400 (K/uL)   BASIC METABOLIC PANEL     Status: Abnormal   Collection Time   02/03/11  9:18 AM      Component Value Range Comment   Sodium 133 (*) 135 - 145 (mEq/L)    Potassium 4.0  3.5 - 5.1 (mEq/L)    Chloride 104  96 - 112 (mEq/L)    CO2 22  19 - 32 (mEq/L)    Glucose, Bld 89  70 - 99 (mg/dL)    BUN 21  6 - 23 (mg/dL)    Creatinine, Ser 8.11  0.50 - 1.35 (mg/dL)    Calcium 8.3 (*) 8.4 - 10.5 (mg/dL)    GFR calc non Af Amer 77 (*) >90 (mL/min)    GFR calc Af Amer 90 (*) >90 (mL/min)     Dg Chest 2 View  02/02/2011  *RADIOLOGY REPORT*  Clinical Data: Chronic back pain.  Fall.  CHEST - 2  VIEW  Comparison: Chest radiograph 10/09/2010.  Findings: There is diffuse  bilateral airspace disease of the perihilar and basilar predominant distribution.  This is most compatible with pulmonary edema and mild CHF.  Cardiomegaly is present.  Aortic arch atherosclerosis.  Multi focal pneumonia is another differential consideration however the symmetry favors CHF. Healed fracture of the left clavicle.  No pleural effusion is identified.  Radiographic follow-up is recommended to ensure clearing and exclude an underlying lesion.  IMPRESSION: Bilateral basilar predominant airspace disease and cardiomegaly are most compatible with mild CHF.  Multifocal pneumonia given secondary consideration.  Original Report Authenticated By: Andreas Newport, M.D.   Dg Lumbar Spine Complete  02/02/2011  *RADIOLOGY REPORT*  Clinical Data: Back pain.  LUMBAR SPINE - COMPLETE 4+ VIEW  Comparison: 03/02/2010.  Findings: Chronic L2 compression fractures present with slightly greater than 50% loss of vertebral body height.  The alignment and compression fracture appear unchanged compared to the prior exam. Abdominal aortic aneurysm is present.  Aortoiliac atherosclerosis is present.  No new lumbar compression fracture is identified. Lower lumbar facet arthrosis.  Osteopenia.  Trace anterolisthesis of L4 on L5 is present.  Sacrum appears intact.  Per CMS PQRS reporting requirements (PQRS Measure 24): Given the patient's age of greater than 50 and the fracture site (hip, distal radius, or spine), the patient should be tested for osteoporosis using DXA, and the appropriate treatment considered based on the DXA results.  IMPRESSION: Chronic L2 compression fracture.  No acute osseous abnormality.  Original Report Authenticated By: Andreas Newport, M.D.   Dg Pelvis 1-2 Views  02/02/2011  *RADIOLOGY REPORT*  Clinical Data: Fall.  Pelvic pain.  PELVIS - 1-2 VIEW  Comparison: None.  Findings: Pelvic rings appear intact.  The patient is slightly  oblique.  Atherosclerosis is noted.  Sacral arcades are obscured by overlying bowel gas.  Hip joint spaces appear symmetric. Osteopenia.  IMPRESSION: No acute osseous abnormality.  Original Report Authenticated By: Andreas Newport, M.D.   Ct Head Wo Contrast  02/02/2011  *RADIOLOGY REPORT*  Clinical Data: Short of breath, back pain and leg pain  CT HEAD WITHOUT CONTRAST  Technique:  Contiguous axial images were obtained from the base of the skull through the vertex without contrast.  Comparison: CT 10/31/2010  Findings: No acute intracranial hemorrhage.  No focal mass lesion. No CT evidence of acute infarction.   No midline shift or mass effect.  No hydrocephalus.  Basilar cisterns are patent.  There is generalized cortical atrophy and periventricular white matter hypodensities unchanged from prior. Paranasal sinuses and mastoid air cells are clear.  Orbits are normal.  IMPRESSION:  1.  No acute intracranial findings. 2.  Atrophy and microvascular disease. 3.  No change from prior.  Original Report Authenticated By: Genevive Bi, M.D.    Review of Systems  Constitutional: Positive for malaise/fatigue. Negative for fever, chills, weight loss and diaphoresis.  HENT: Negative for hearing loss, ear pain, nosebleeds, congestion, sore throat, neck pain, tinnitus and ear discharge.   Eyes: Negative for blurred vision, double vision, photophobia, pain, discharge and redness.  Respiratory: Positive for cough. Negative for hemoptysis, sputum production, shortness of breath, wheezing and stridor.   Cardiovascular: Negative for chest pain, palpitations, orthopnea, claudication, leg swelling and PND.  Gastrointestinal: Negative for heartburn, nausea, vomiting, abdominal pain, diarrhea, constipation, blood in stool and melena.  Genitourinary: Negative for dysuria, urgency, frequency, hematuria and flank pain.  Musculoskeletal: Positive for myalgias, back pain and joint pain. Negative for falls.  Skin: Negative  for itching and rash.  Neurological: Positive for weakness. Negative for  dizziness, tingling, tremors, speech change, focal weakness, seizures, loss of consciousness and headaches.  Endo/Heme/Allergies: Negative for environmental allergies and polydipsia. Does not bruise/bleed easily.  Psychiatric/Behavioral: Positive for substance abuse. Negative for depression, suicidal ideas, hallucinations and memory loss. The patient is not nervous/anxious.   All other systems reviewed and are negative.   Blood pressure 133/70, pulse 91, temperature 99.9 F (37.7 C), temperature source Oral, resp. rate 21, height 5\' 7"  (1.702 m), weight 58.968 kg (130 lb), SpO2 95.00%. Physical Exam: see procedure note in endopro.  Assessment/Plan: 1) Coffee ground emesis /?alcoholic gastritis with iron deficiency anemia: Plans are to proceed with an EGD at this time. Further recommendations to be made thereafter. 2) COPD vs pneumonia: On Levaquin at this time.   Tanayah Squitieri 02/03/2011, 2:22 PM

## 2011-02-03 NOTE — Progress Notes (Signed)
SUBJECTIVE Feels ok, had good night sleep. Coughing some. No hematemesis since admission.   1. Pneumonia   2. SIRS (systemic inflammatory response syndrome)   3. Hypothermia   4. UTI (lower urinary tract infection)     Past Medical History  Diagnosis Date  . COPD (chronic obstructive pulmonary disease)   . Chronic back pain    Current Facility-Administered Medications  Medication Dose Route Frequency Provider Last Rate Last Dose  . 0.9 %  sodium chloride infusion   Intravenous Once Forbes Cellar, MD 500 mL/hr at 02/02/11 1529    . 0.9 % NaCl with KCl 20 mEq/ L  infusion   Intravenous Continuous Seirra Kos 75 mL/hr at 02/03/11 0548 1,000 mL at 02/03/11 0548  . acetaminophen (TYLENOL) tablet 650 mg  650 mg Oral Q6H PRN Staley Budzinski       Or  . acetaminophen (TYLENOL) suppository 650 mg  650 mg Rectal Q6H PRN Anona Giovannini      . albuterol (PROVENTIL) (5 MG/ML) 0.5% nebulizer solution 2.5 mg  2.5 mg Nebulization Q6H Zaire Levesque      . docusate sodium (COLACE) capsule 100 mg  100 mg Oral BID Yolanda Huffstetler      . folic acid (FOLVITE) tablet 1 mg  1 mg Oral Daily Naiyana Barbian      . guaiFENesin (MUCINEX) 12 hr tablet 600 mg  600 mg Oral BID [place]ton Teters      . Levofloxacin (LEVAQUIN) IVPB 750 mg  750 mg Intravenous Q24H Forbes Cellar, MD   750 mg at 02/02/11 1529  . LORazepam (ATIVAN) tablet 1 mg  1 mg Oral Q6H PRN Silvio Sausedo       Or  . LORazepam (ATIVAN) injection 1 mg  1 mg Intravenous Q6H PRN Geniece Akers      . morphine 2 MG/ML injection 1 mg  1 mg Intravenous Q4H PRN Alyene Predmore      . morphine 4 MG/ML injection 4 mg  4 mg Intravenous Once Forbes Cellar, MD   4 mg at 02/02/11 1409  . mulitivitamin with minerals tablet 1 tablet  1 tablet Oral Daily Mong Neal      . ondansetron (ZOFRAN) tablet 4 mg  4 mg Oral Q6H PRN Anvay Tennis       Or  . ondansetron (ZOFRAN) injection 4 mg  4 mg Intravenous Q6H PRN Taquita Demby      . pantoprazole (PROTONIX) injection 40  mg  40 mg Intravenous Q24H Mikeisha Lemonds   40 mg at 02/02/11 2023  . sodium chloride 0.9 % bolus 1,000 mL  1,000 mL Intravenous Once Forbes Cellar, MD   1,000 mL at 02/02/11 1608  . sodium chloride 0.9 % bolus 500 mL  500 mL Intravenous Once Forbes Cellar, MD   500 mL at 02/02/11 1608  . thiamine (VITAMIN B-1) tablet 100 mg  100 mg Oral Daily Temiloluwa Recchia       Or  . thiamine (B-1) injection 100 mg  100 mg Intravenous Daily Antwann Preziosi      . vancomycin (VANCOCIN) 500 mg in sodium chloride 0.9 % 100 mL IVPB  500 mg Intravenous Q12H Leann Trefz Poindexter, PHARMD   500 mg at 02/03/11 0713  . vancomycin (VANCOCIN) IVPB 1000 mg/200 mL premix  1,000 mg Intravenous To ER Forbes Cellar, MD   1,000 mg at 02/02/11 1830  . DISCONTD: folic acid (FOLVITE) tablet 1 mg  1 mg Oral Daily Annye Forrey      .  DISCONTD: meropenem (MERREM) 1 g in sodium chloride 0.9 % 100 mL IVPB  1 g Intravenous Q8H Forbes Cellar, MD   1 g at 02/03/11 0548  . DISCONTD: mulitivitamin with minerals tablet 1 tablet  1 tablet Oral Daily Rashad Auld      . DISCONTD: thiamine (VITAMIN B-1) tablet 100 mg  100 mg Oral Daily Qusai Kem       Allergies  Allergen Reactions  . Penicillins Itching   Active Problems:  Pneumonia  Upper GI bleeding  Anemia associated with acute blood loss  Alcoholism  Leukocytosis   Vital signs in last 24 hours: Temp:  [94.6 F (34.8 C)-99.5 F (37.5 C)] 99 F (37.2 C) (12/23 0535) Pulse Rate:  [86-109] 97  (12/23 0535) Resp:  [17-23] 18  (12/23 0535) BP: (78-159)/(37-103) 132/69 mmHg (12/23 0535) SpO2:  [94 %-100 %] 98 % (12/23 0520) Weight:  [58.968 kg (130 lb)] 130 lb (58.968 kg) (12/22 2144) Weight change:     Intake/Output from previous day: 12/22 0701 - 12/23 0700 In: 650 [I.V.:150; Blood:500] Out: 350 [Urine:350] Intake/Output this shift: Total I/O In: 300 [P.O.:300] Out: -   Lab Results:  Basename 02/02/11 1400  WBC 23.1*  HGB 7.9*  HCT 25.3*  PLT 491*    BMET  Basename 02/02/11 1400  NA 135  K 4.7  CL 97  CO2 22  GLUCOSE 110*  BUN 27*  CREATININE 1.00  CALCIUM 8.9    Studies/Results: Dg Chest 2 View  02/02/2011  *RADIOLOGY REPORT*  Clinical Data: Chronic back pain.  Fall.  CHEST - 2 VIEW  Comparison: Chest radiograph 10/09/2010.  Findings: There is diffuse bilateral airspace disease of the perihilar and basilar predominant distribution.  This is most compatible with pulmonary edema and mild CHF.  Cardiomegaly is present.  Aortic arch atherosclerosis.  Multi focal pneumonia is another differential consideration however the symmetry favors CHF. Healed fracture of the left clavicle.  No pleural effusion is identified.  Radiographic follow-up is recommended to ensure clearing and exclude an underlying lesion.  IMPRESSION: Bilateral basilar predominant airspace disease and cardiomegaly are most compatible with mild CHF.  Multifocal pneumonia given secondary consideration.  Original Report Authenticated By: Andreas Newport, M.D.   Dg Lumbar Spine Complete  02/02/2011  *RADIOLOGY REPORT*  Clinical Data: Back pain.  LUMBAR SPINE - COMPLETE 4+ VIEW  Comparison: 03/02/2010.  Findings: Chronic L2 compression fractures present with slightly greater than 50% loss of vertebral body height.  The alignment and compression fracture appear unchanged compared to the prior exam. Abdominal aortic aneurysm is present.  Aortoiliac atherosclerosis is present.  No new lumbar compression fracture is identified. Lower lumbar facet arthrosis.  Osteopenia.  Trace anterolisthesis of L4 on L5 is present.  Sacrum appears intact.  Per CMS PQRS reporting requirements (PQRS Measure 24): Given the patient's age of greater than 50 and the fracture site (hip, distal radius, or spine), the patient should be tested for osteoporosis using DXA, and the appropriate treatment considered based on the DXA results.  IMPRESSION: Chronic L2 compression fracture.  No acute osseous  abnormality.  Original Report Authenticated By: Andreas Newport, M.D.   Dg Pelvis 1-2 Views  02/02/2011  *RADIOLOGY REPORT*  Clinical Data: Fall.  Pelvic pain.  PELVIS - 1-2 VIEW  Comparison: None.  Findings: Pelvic rings appear intact.  The patient is slightly oblique.  Atherosclerosis is noted.  Sacral arcades are obscured by overlying bowel gas.  Hip joint spaces appear symmetric. Osteopenia.  IMPRESSION: No acute  osseous abnormality.  Original Report Authenticated By: Andreas Newport, M.D.   Ct Head Wo Contrast  02/02/2011  *RADIOLOGY REPORT*  Clinical Data: Short of breath, back pain and leg pain  CT HEAD WITHOUT CONTRAST  Technique:  Contiguous axial images were obtained from the base of the skull through the vertex without contrast.  Comparison: CT 10/31/2010  Findings: No acute intracranial hemorrhage.  No focal mass lesion. No CT evidence of acute infarction.   No midline shift or mass effect.  No hydrocephalus.  Basilar cisterns are patent.  There is generalized cortical atrophy and periventricular white matter hypodensities unchanged from prior. Paranasal sinuses and mastoid air cells are clear.  Orbits are normal.  IMPRESSION:  1.  No acute intracranial findings. 2.  Atrophy and microvascular disease. 3.  No change from prior.  Original Report Authenticated By: Genevive Bi, M.D.    Medications: I have reviewed the patient's current medications.   Physical exam GENERAL- alert HEAD- normal atraumatic, no neck masses, normal thyroid, no jvd RESPIRATORY- occasional cough, no wheezing. CVS- regular rate and rhythm, S1, S2 normal, no murmur, click, rub or gallop ABDOMEN- abdomen is soft without significant tenderness, masses, organomegaly or guarding NEURO- Grossly normal EXTREMITIES- extremities normal, atraumatic, no cyanosis or edema  Plan .Pneumonia- clinically better. Afebrile. Await wbc for today, expect improvement. Start deescalating abx- d/c merem. Marland KitchenUpper GI bleeding- IDA.  Likely acute on chronic upper gi loss. Dr Loreta Ave will see today. Follow indices after prbc transfusion. Continue ppi. Marland KitchenAlcoholism- on CIWA protocol. No evidence of withdrawal. .Anemia associated with acute blood loss-  S/p transfusion of 2 units prbc . Hemodynamically stable. Follow hb. dvt prophylaxis- scds        Dale Mcfarland 02/03/2011 8:33 AM Pager: [phone].

## 2011-02-04 DIAGNOSIS — D509 Iron deficiency anemia, unspecified: Secondary | ICD-10-CM

## 2011-02-04 DIAGNOSIS — K210 Gastro-esophageal reflux disease with esophagitis: Secondary | ICD-10-CM

## 2011-02-04 DIAGNOSIS — K920 Hematemesis: Secondary | ICD-10-CM

## 2011-02-04 LAB — CBC
Hemoglobin: 8.1 g/dL — ABNORMAL LOW (ref 13.0–17.0)
MCH: 26.1 pg (ref 26.0–34.0)
MCV: 79.7 fL (ref 78.0–100.0)
Platelets: 329 10*3/uL (ref 150–400)
RBC: 3.1 MIL/uL — ABNORMAL LOW (ref 4.22–5.81)
WBC: 14.2 10*3/uL — ABNORMAL HIGH (ref 4.0–10.5)

## 2011-02-04 LAB — COMPREHENSIVE METABOLIC PANEL
AST: 23 U/L (ref 0–37)
Albumin: 2.5 g/dL — ABNORMAL LOW (ref 3.5–5.2)
BUN: 15 mg/dL (ref 6–23)
Calcium: 8.5 mg/dL (ref 8.4–10.5)
Creatinine, Ser: 0.93 mg/dL (ref 0.50–1.35)
GFR calc non Af Amer: 78 mL/min — ABNORMAL LOW (ref >90)
Total Bilirubin: 0.2 mg/dL — ABNORMAL LOW (ref 0.3–1.2)

## 2011-02-04 MED ORDER — PANTOPRAZOLE SODIUM 40 MG IV SOLR
40.0000 mg | Freq: Two times a day (BID) | INTRAVENOUS | Status: DC
  Administered 2011-02-04 – 2011-02-05 (×3): 40 mg via INTRAVENOUS
  Filled 2011-02-04 (×7): qty 40

## 2011-02-04 NOTE — Discharge Planning (Signed)
Dale Mcfarland with [facility] would like to be notified of the Patient"s discharge needs. She can be reached at [phone].

## 2011-02-04 NOTE — Progress Notes (Signed)
UR completed 

## 2011-02-04 NOTE — Progress Notes (Signed)
Appreciate gi input.  SUBJECTIVE Feels ok. Still coughing.   1. Pneumonia   2. SIRS (systemic inflammatory response syndrome)   3. Hypothermia   4. UTI (lower urinary tract infection)     Past Medical History  Diagnosis Date  . COPD (chronic obstructive pulmonary disease)   . Chronic back pain   . Pneumonia   . Depression   . Blood transfusion    Current Facility-Administered Medications  Medication Dose Route Frequency Provider Last Rate Last Dose  . 0.9 % NaCl with KCl 20 mEq/ L  infusion   Intravenous Continuous Alahia Whicker 50 mL/hr at 02/04/11 1400    . acetaminophen (TYLENOL) tablet 650 mg  650 mg Oral Q6H PRN Tally Mckinnon       Or  . acetaminophen (TYLENOL) suppository 650 mg  650 mg Rectal Q6H PRN Donnice Nielsen      . albuterol (PROVENTIL) (5 MG/ML) 0.5% nebulizer solution 2.5 mg  2.5 mg Nebulization Q6H PRN Nikoloz Huy      . docusate sodium (COLACE) capsule 100 mg  100 mg Oral BID Konnar Ben   100 mg at 02/04/11 0901  . folic acid (FOLVITE) tablet 1 mg  1 mg Oral Daily Nishan Ovens   1 mg at 02/04/11 0901  . guaiFENesin (MUCINEX) 12 hr tablet 600 mg  600 mg Oral BID Tyaisha Cullom   600 mg at 02/04/11 0901  . Levofloxacin (LEVAQUIN) IVPB 750 mg  750 mg Intravenous Q24H Forbes Cellar, MD   750 mg at 02/04/11 1453  . LORazepam (ATIVAN) tablet 1 mg  1 mg Oral Q6H PRN Fonda Rochon   1 mg at 02/04/11 0142   Or  . LORazepam (ATIVAN) injection 1 mg  1 mg Intravenous Q6H PRN Stefanee Mckell      . morphine 2 MG/ML injection 1 mg  1 mg Intravenous Q4H PRN Jatasia Gundrum      . mulitivitamin with minerals tablet 1 tablet  1 tablet Oral Daily Giordan Fordham   1 tablet at 02/04/11 0901  . ondansetron (ZOFRAN) tablet 4 mg  4 mg Oral Q6H PRN Errika Narvaiz       Or  . ondansetron (ZOFRAN) injection 4 mg  4 mg Intravenous Q6H PRN Cortnee Steinmiller      . pantoprazole (PROTONIX) injection 40 mg  40 mg Intravenous Q12H Eliezer Bottom., MD,FACG   40 mg at 02/04/11 1000  . thiamine  (VITAMIN B-1) tablet 100 mg  100 mg Oral Daily Faysal Fenoglio   100 mg at 02/04/11 0901   Or  . thiamine (B-1) injection 100 mg  100 mg Intravenous Daily Amariona Rathje   100 mg at 02/03/11 1011  . vancomycin (VANCOCIN) 500 mg in sodium chloride 0.9 % 100 mL IVPB  500 mg Intravenous Q12H Leann Trefz Poindexter, PHARMD   500 mg at 02/04/11 1812  . DISCONTD: albuterol (PROVENTIL) (5 MG/ML) 0.5% nebulizer solution 2.5 mg  2.5 mg Nebulization Q6H Damare Serano   2.5 mg at 02/03/11 2032  . DISCONTD: pantoprazole (PROTONIX) injection 40 mg  40 mg Intravenous Q24H Dhruv Christina   40 mg at 02/03/11 2244   Allergies  Allergen Reactions  . Penicillins Itching   Active Problems:  Pneumonia  Upper GI bleeding  Anemia associated with acute blood loss  Alcoholism  Leukocytosis   Vital signs in last 24 hours: Temp:  [98.1 F (36.7 C)-99.6 F (37.6 C)] 98.1 F (36.7 C) (12/24 1438) Pulse Rate:  [83-88] 83  (12/24  1438) Resp:  [18] 18  (12/24 1438) BP: (121-168)/(67-93) 121/67 mmHg (12/24 1438) SpO2:  [95 %-97 %] 95 % (12/24 1438) Weight change:  Last BM Date: 02/01/11  Intake/Output from previous day: 12/23 0701 - 12/24 0700 In: 606.3 [P.O.:500; I.V.:106.3] Out: 901 [Urine:901] Intake/Output this shift: Total I/O In: 3015 [P.O.:640; I.V.:1825; IV Piggyback:550] Out: 825 [Urine:825]  Lab Results:  [facility] 02/04/11 0024 02/03/11 0918  WBC 14.2* 21.7*  HGB 8.1* 8.6*  HCT 24.7* 25.9*  PLT 329 360   BMET  Basename 02/04/11 0024 02/03/11 0918  NA 132* 133*  K 3.7 4.0  CL 104 104  CO2 22 22  GLUCOSE 86 89  BUN 15 21  CREATININE 0.93 0.94  CALCIUM 8.5 8.3*    Studies/Results: No results found.  Medications: I have reviewed the patient's current medications.   Physical exam GENERAL- alert HEAD- normal atraumatic, no neck masses, normal thyroid, no jvd RESPIRATORY- appears well, vitals normal, no respiratory distress, acyanotic, normal RR, ear and throat exam is normal,  neck free of mass or lymphadenopathy, chest clear, no wheezing, crepitations, rhonchi, normal symmetric air entry CVS- regular rate and rhythm, S1, S2 normal, no murmur, click, rub or gallop ABDOMEN- abdomen is soft without significant tenderness, masses, organomegaly or guarding NEURO- Grossly normal EXTREMITIES- extremities normal, atraumatic, no cyanosis or edema  Plan .Pneumonia- clinically better. Afebrile. Wbc better. D/c vanc. Marland KitchenUpper GI bleeding- IDA. Severe gastritis, gi appreciated. Give 1 more unit prbc. Marland KitchenAlcoholism- on CIWA protocol. No evidence of withdrawal. .Anemia associated with acute blood loss- S/p transfusion of 2 units prbc . Hemodynamically stable. Follow hb.  dvt prophylaxis- scds  Disposition- d/c in am?        Alichia Alridge 02/04/2011 6:46 PM Pager: [phone].

## 2011-02-04 NOTE — Progress Notes (Signed)
Patient ID: Dale Mcfarland, male   DOB: Nov 20, 1929, 77 y.o.   MRN: [id number] Dale Mcfarland [facility] Progress Note  Subjective: No complaints. Hungry. No BMs. No N/V.  Objective:  Vital signs in last 24 hours: Temp:  [98.5 F (36.9 C)-99.9 F (37.7 C)] 98.5 F (36.9 C) (12/24 0630) Pulse Rate:  [86-91] 88  (12/24 0630) Resp:  [14-29] 18  (12/24 0630) BP: (103-168)/(57-93) 168/93 mmHg (12/24 0630) SpO2:  [94 %-100 %] 96 % (12/24 0630) FiO2 (%):  [1 %-2 %] 1 % (12/23 1544) Last BM Date: 02/01/11 General:   Alert,  Well-developed, white male in NAD Heart:  Regular rate and rhythm; no murmurs Abdomen:  Soft, nontender and nondistended. Normal bowel sounds, without guarding, and without rebound.   Extremities:  Without edema. Neurologic:  Alert and  oriented x4;  grossly normal neurologically. Psych:  Alert and cooperative.   Intake/Output from previous day: 12/23 0701 - 12/24 0700 In: 606.3 [P.O.:500; I.V.:106.3] Out: 901 [Urine:901] Intake/Output this shift: Total I/O In: 1574.2 [I.V.:1274.2; IV Piggyback:300] Out: -   Lab Results:  Basename 02/04/11 0024 02/03/11 0918 02/02/11 1400  WBC 14.2* 21.7* 23.1*  HGB 8.1* 8.6* 7.9*  HCT 24.7* 25.9* 25.3*  PLT 329 360 491*   BMET  Basename 02/04/11 0024 02/03/11 0918 02/02/11 1400  NA 132* 133* 135  K 3.7 4.0 4.7  CL 104 104 97  CO2 22 22 22   GLUCOSE 86 89 110*  BUN 15 21 27*  CREATININE 0.93 0.94 1.00  CALCIUM 8.5 8.3* 8.9   LFT  Basename 02/04/11 0024  PROT 5.6*  ALBUMIN 2.5*  AST 23  ALT 12  ALKPHOS 89  BILITOT 0.2*  BILIDIR --  IBILI --   PT/INR  Basename 02/03/11 0914  LABPROT 15.3*  INR 1.18    Assessment / Plan:  1. Esophagitis, severe. Continue PPI long term. Avoid etoh. Await biopsies and treat for Candida if present. Advance diet slowly as tolerated.  2. Anemia. Secondary to acute blood loss and fe deficiency. Fe replacement.  We will sign off. Please call if needed.  Active Problems:  Pneumonia  Upper GI bleeding  Alcoholism  Leukocytosis  Anemia associated with acute blood loss    LOS: 2 days   Oluwadarasimi Redmon T. Russella Dar MD [facility] 02/04/2011, 8:35 AM

## 2011-02-05 LAB — TYPE AND SCREEN: Unit division: 0

## 2011-02-05 LAB — BASIC METABOLIC PANEL
BUN: 9 mg/dL (ref 6–23)
Calcium: 8.7 mg/dL (ref 8.4–10.5)
Creatinine, Ser: 0.88 mg/dL (ref 0.50–1.35)
GFR calc Af Amer: >90 mL/min (ref >90)
GFR calc non Af Amer: 80 mL/min — ABNORMAL LOW (ref >90)

## 2011-02-05 LAB — CBC
HCT: 31.6 % — ABNORMAL LOW (ref 39.0–52.0)
MCH: 26.7 pg (ref 26.0–34.0)
MCHC: 32.9 g/dL (ref 30.0–36.0)
MCV: 81 fL (ref 78.0–100.0)
RDW: 15.7 % — ABNORMAL HIGH (ref 11.5–15.5)

## 2011-02-05 MED ORDER — AMLODIPINE BESYLATE 5 MG PO TABS
5.0000 mg | ORAL_TABLET | Freq: Every day | ORAL | Status: DC
  Administered 2011-02-05 – 2011-02-06 (×2): 5 mg via ORAL
  Filled 2011-02-05 (×2): qty 1

## 2011-02-05 MED ORDER — LEVOFLOXACIN 750 MG PO TABS
750.0000 mg | ORAL_TABLET | ORAL | Status: DC
  Filled 2011-02-05: qty 1

## 2011-02-05 NOTE — Progress Notes (Signed)
Dale Mcfarland is a 77 y.o. male patient who lives in a tent and just came out of prison. He came in with coffee ground emesis and cough after drinking a scotch. Patient found to have bilateral PNA. His wbc was 16109, hb 7.9. Patient started on broad spectrum abx, is now on po levaquin. Wbc 13000. He had egd by Dr Russella Dar with findings of gastritis. He required prbc transfusion. His BP and hemoglobin now stable. He is being transitioned to regular diet and oral antibiotics. Hopefully d/c in am. He needs CSW to help with disposition.  SUBJECTIVE Says he is not yet well enough.   1. Pneumonia   2. SIRS (systemic inflammatory response syndrome)   3. Hypothermia   4. UTI (lower urinary tract infection)     Past Medical History  Diagnosis Date  . COPD (chronic obstructive pulmonary disease)   . Chronic back pain   . Pneumonia   . Depression   . Blood transfusion    Current Facility-Administered Medications  Medication Dose Route Frequency Provider Last Rate Last Dose  . acetaminophen (TYLENOL) tablet 650 mg  650 mg Oral Q6H PRN Sallie Maker       Or  . acetaminophen (TYLENOL) suppository 650 mg  650 mg Rectal Q6H PRN Siddiq Kaluzny      . albuterol (PROVENTIL) (5 MG/ML) 0.5% nebulizer solution 2.5 mg  2.5 mg Nebulization Q6H PRN Raechel Marcos      . docusate sodium (COLACE) capsule 100 mg  100 mg Oral BID Emiya Loomer   100 mg at 02/05/11 0927  . folic acid (FOLVITE) tablet 1 mg  1 mg Oral Daily Soliana Kitko   1 mg at 02/05/11 0927  . guaiFENesin (MUCINEX) 12 hr tablet 600 mg  600 mg Oral BID Vikrant Pryce   600 mg at 02/05/11 0926  . levofloxacin (LEVAQUIN) tablet 750 mg  750 mg Oral Daily Jennea Rager      . LORazepam (ATIVAN) tablet 1 mg  1 mg Oral Q6H PRN Kerith Sherley   1 mg at 02/04/11 0142   Or  . LORazepam (ATIVAN) injection 1 mg  1 mg Intravenous Q6H PRN Markis Langland      . morphine 2 MG/ML injection 1 mg  1 mg Intravenous Q4H PRN Moishe Schellenberg      . mulitivitamin with minerals  tablet 1 tablet  1 tablet Oral Daily Kamdon Reisig   1 tablet at 02/05/11 0927  . ondansetron (ZOFRAN) tablet 4 mg  4 mg Oral Q6H PRN Devanee Pomplun       Or  . ondansetron (ZOFRAN) injection 4 mg  4 mg Intravenous Q6H PRN Jazari Ober      . pantoprazole (PROTONIX) injection 40 mg  40 mg Intravenous Q12H Eliezer Bottom., MD,FACG   40 mg at 02/05/11 6045  . thiamine (VITAMIN B-1) tablet 100 mg  100 mg Oral Daily Milt Coye   100 mg at 02/05/11 4098   Or  . thiamine (B-1) injection 100 mg  100 mg Intravenous Daily Jerome Viglione   100 mg at 02/03/11 1011  . DISCONTD: 0.9 % NaCl with KCl 20 mEq/ L  infusion   Intravenous Continuous Orvis Stann 50 mL/hr at 02/05/11 0437    . DISCONTD: Levofloxacin (LEVAQUIN) IVPB 750 mg  750 mg Intravenous Q24H Forbes Cellar, MD   750 mg at 02/05/11 1430  . DISCONTD: vancomycin (VANCOCIN) 500 mg in sodium chloride 0.9 % 100 mL IVPB  500 mg Intravenous Q12H Leann  Newt Minion, PHARMD   500 mg at 02/04/11 1812   Allergies  Allergen Reactions  . Penicillins Itching   Active Problems:  Pneumonia  Upper GI bleeding  Anemia associated with acute blood loss  Alcoholism  Leukocytosis   Vital signs in last 24 hours: Temp:  [98.2 F (36.8 C)-99.3 F (37.4 C)] 98.3 F (36.8 C) (12/25 1300) Pulse Rate:  [90-100] 90  (12/25 1300) Resp:  [18-24] 18  (12/25 1300) BP: (144-168)/(72-90) 164/85 mmHg (12/25 1300) SpO2:  [97 %] 97 % (12/25 1300) Weight change:  Last BM Date: 02/02/11  Intake/Output from previous day: 12/24 0701 - 12/25 0700 In: 4175 [P.O.:1000; I.V.:2310; Blood:315; IV Piggyback:550] Out: 2360 [Urine:2360] Intake/Output this shift:    Lab Results:  Basename 02/05/11 0505 02/04/11 0024  WBC 13.3* 14.2*  HGB 10.4* 8.1*  HCT 31.6* 24.7*  PLT 355 329   BMET  Basename 02/05/11 0505 02/04/11 0024  NA 135 132*  K 3.7 3.7  CL 105 104  CO2 21 22  GLUCOSE 116* 86  BUN 9 15  CREATININE 0.88 0.93  CALCIUM 8.7 8.5     Studies/Results: No results found.  Medications: I have reviewed the patient's current medications.   Physical exam GENERAL- alert  HEAD- normal atraumatic, no neck masses, normal thyroid, no jvd  RESPIRATORY- appears well, vitals normal, no respiratory distress, acyanotic, normal RR, ear and throat exam is normal, neck free of mass or lymphadenopathy, chest clear, no wheezing, crepitations, rhonchi, normal symmetric air entry  CVS- regular rate and rhythm, S1, S2 normal, no murmur, click, rub or gallop  ABDOMEN- abdomen is soft without significant tenderness, masses, organomegaly or guarding  NEURO- Grossly normal  EXTREMITIES- extremities normal, atraumatic, no cyanosis or edema     Plan .Pneumonia- clinically better. Afebrile. Wbc better. Transition to oral levaquin. Marland KitchenUpper GI bleeding- IDA. Severe gastritis, gi appreciated. Continue ppi. Advance diet. .Alcoholism- on CIWA protocol. No evidence of withdrawal. .Anemia associated with acute blood loss- S/p transfusion of 3 units prbc . Hemodynamically stable. Follow hb. Htn- uncontrolled. Add norvasc.  dvt prophylaxis- scds  Disposition- d/c in am?    Brittinee Risk 02/05/2011 4:10 PM Pager: [phone].

## 2011-02-06 ENCOUNTER — Encounter (HOSPITAL_COMMUNITY): Payer: Self-pay | Admitting: Gastroenterology

## 2011-02-06 LAB — CBC
HCT: 31.2 % — ABNORMAL LOW (ref 39.0–52.0)
MCHC: 32.4 g/dL (ref 30.0–36.0)
RDW: 15.7 % — ABNORMAL HIGH (ref 11.5–15.5)

## 2011-02-06 LAB — BASIC METABOLIC PANEL
BUN: 12 mg/dL (ref 6–23)
GFR calc Af Amer: 75 mL/min — ABNORMAL LOW (ref >90)
GFR calc non Af Amer: 65 mL/min — ABNORMAL LOW (ref >90)
Potassium: 3.6 mEq/L (ref 3.5–5.1)
Sodium: 132 mEq/L — ABNORMAL LOW (ref 135–145)

## 2011-02-06 LAB — URINE CULTURE

## 2011-02-06 MED ORDER — THIAMINE HCL 100 MG PO TABS: 100.0000 mg | ORAL_TABLET | Freq: Every day | ORAL | Status: DC

## 2011-02-06 MED ORDER — AMLODIPINE BESYLATE 5 MG PO TABS: 5.0000 mg | ORAL_TABLET | Freq: Every day | ORAL | Status: DC

## 2011-02-06 MED ORDER — FLUCONAZOLE 100 MG PO TABS: 100.0000 mg | ORAL_TABLET | Freq: Every day | ORAL | Status: AC

## 2011-02-06 MED ORDER — DOXYCYCLINE HYCLATE 100 MG PO TABS: 100.0000 mg | ORAL_TABLET | Freq: Two times a day (BID) | ORAL | Status: AC

## 2011-02-06 MED ORDER — FLUCONAZOLE 200 MG PO TABS
200.0000 mg | ORAL_TABLET | Freq: Once | ORAL | Status: DC
  Filled 2011-02-06: qty 1

## 2011-02-06 MED ORDER — LEVOFLOXACIN 750 MG PO TABS: 750.0000 mg | ORAL_TABLET | ORAL | Status: DC

## 2011-02-06 MED ORDER — ADULT MULTIVITAMIN W/MINERALS CH: 1.0000 | ORAL_TABLET | Freq: Every day | ORAL | Status: DC

## 2011-02-06 MED ORDER — OMEPRAZOLE MAGNESIUM 20 MG PO TBEC: 40.0000 mg | DELAYED_RELEASE_TABLET | Freq: Two times a day (BID) | ORAL | Status: DC

## 2011-02-06 MED ORDER — OMEPRAZOLE MAGNESIUM 20 MG PO TBEC: 20.0000 mg | DELAYED_RELEASE_TABLET | Freq: Every day | ORAL | Status: DC

## 2011-02-06 MED ORDER — SENNA-DOCUSATE SODIUM 8.6-50 MG PO TABS: 2.0000 | ORAL_TABLET | Freq: Every day | ORAL | Status: DC | PRN

## 2011-02-06 MED ORDER — FOLIC ACID 1 MG PO TABS: 1.0000 mg | ORAL_TABLET | Freq: Every day | ORAL | Status: DC

## 2011-02-06 NOTE — Progress Notes (Signed)
Spoke with patient regarding d/c and medication needs. He was unhappy about d/c, wanted to d/c tomorrow. Discussed that he was medically stable for d/c and the attending had written he could d/c. He stated at first he could pay out of pocket for his meds, then stated that he did not get paid for several days and would need assistance. Also discussed an appt with [facility], he stated he had not seen a doctor there in 6 months but according to the chart it had been almost 57yrs. He stated "6 months or 2 yrs, what's the difference". Offered to get CSW to get him a bus pass and he was agreeable. Told him I could help him get an appt at [facility] but would not be able to call until after 2 pm when the office reopened after lunch. He was agreeable to this also. As I was arranging medication assistance, patient left AMA. According to the nurse, he took 2 of his scripts and go on the elevator saying he had to catch the bus. Dr. Waymon Amato aware. No med assist given or appt made prior to patient leaving.

## 2011-02-06 NOTE — Progress Notes (Signed)
Pt. Left before discharge plans were completed. He took one copy of his discharge papers but would not sign them.He  Took two of his 4 prescriptions. Dr.Hongali was signing the prescriptions & pt. got on the elevator & would not wait to get the other two prescriptions. Pt. York Spaniel he had to catch the bus. No opportunity for pt. To sign AMA papers.

## 2011-02-06 NOTE — Clinical Documentation Improvement (Signed)
PNEUMONIA DOCUMENTATION CLARIFICATION QUERY  THIS DOCUMENT IS NOT A PERMANENT PART OF THE MEDICAL RECORD  TO RESPOND TO THE THIS QUERY, FOLLOW THE INSTRUCTIONS BELOW:  1. If needed, update documentation for the patient's encounter via the notes activity.  2. Access this query again and click edit on the [employer].  3. After updating, or not, click F2 to complete all highlighted (required) fields concerning your review. Select "additional documentation in the medical record" OR "no additional documentation provided".  4. Click Sign note button.  5. The deficiency will fall out of your InBasket *Please let us know if you are not able to complete this workflow by phone or e-mail (listed below).  Please update your documentation within the medical record to reflect your response to this query.                                                                                    02/06/11  Dear Dr.HONGALGI[facility]  In a better effort to capture your patient's severity of illness, reflect appropriate length of stay and utilization of resources, a review of the patient medical record has revealed the following indicators.    Based on your clinical judgment, please clarify and document in a progress note and/or discharge summary the clinical condition associated with the following supporting information:  In responding to this query please exercise your independent judgment.  The fact that a query is asked, does not imply that any particular answer is desired or expected.   Based on the patient's current clinical presentation please clarify:  Pt admitted with Bilateral PNA per pn 02/05/11  According to H/P pt vomited coffee ground material. Please clarify whether or not bilateral PNA  can be further classified as one of the diagnoses listed below and document in pn and d/c summary. Thank You!   Possible Clinical Conditions?   _______Aspiration Pneumonia (POA?) _______Gram  Negative Pneumonia (POA?)  Bacterial pneumonia, specify type if known (POA?) _______Klebsiella PNA _______E Coli PNA _______Pseudomonas PNA _______Candidiasis PNA _______Staph/MRSA PNA _______Strep PNA _______Pneumococcal PNA _______H Influenza PNA _______H Para Influenza PNA  _______Other Condition____________________ _______Cannot Clinically Determine     Supporting Information:  Risk Factors:  Bil PNA, alcoholism, Upper GI bleeding, SIRS, Esophagitis, severe   Signs & Symptoms vomiting coffee ground material after drinking the scotch  Diagnostics: Lab:  Component     Latest Ref Rng 02/02/2011  WBC     4.0 - 10.5 K/uL 23.1 (H)   Component Neutrophils Relative  Latest Ref Rng 43 - 77 %  02/02/2011 92 (H)   Component WBC Morphology  Latest Ref Rng   02/02/2011 MILD LEFT SHIFT (1-5% METAS, OCC MYELO, OCC BANDS)    Radiology 02/02/11 CXR FINDINGS: This is most compatible with pulmonary edema and mild CHF.  Cardiomegaly is present.  Aortic arch atherosclerosis.  Multi focal pneumonia is another differential consideration however the symmetry favors CHF.  IMPRESSION: Bilateral basilar predominant airspace disease and cardiomegaly are most compatible with mild CHF. Multifocal pneumonia given secondary consideration.     Swallow eval:  Microbiology: Blood C&S: ordered Sputum C&S Urine C&S  Treatment: NORVASC levaquin vancomycin  Respiratory Treatments: PROVENTIL  vomiting coffee ground material after drinking the scotch  You may use possible, probable, or suspect with inpatient documentation. possible, probable, suspected diagnoses MUST be documented at the time of discharge  Reviewed: additional documentation in the medical record 02/06/11. Q not placed in [employer]. [facility].  Thank You,  Enis Slipper  RN, BSN, CCDS Clinical Documentation Specialist [facility] Dept Pager: [phone] / E-mail: [email or web address]  Health Information  Management [place]

## 2011-02-06 NOTE — Discharge Summary (Addendum)
Discharge Summary  Omarion Minnehan MR#: [id number]  DOB:May 08, 1929  Date of Admission: 02/02/2011 Date of Discharge: 02/06/2011  Patient's PCP: No primary provider on file.  Attending Physician:Lennyx Verdell  Consults: Gastroenterology: Dr. Charna Elizabeth  Discharge Diagnoses: Active Problems:  Pneumonia  Upper GI bleeding  Alcoholism  Leukocytosis  Anemia associated with acute blood loss   Brief Admitting History and Physical Dale Mcfarland is a 77 y.o. male patient who lives in a tent and just came out of prison. He came in with coffee ground emesis and cough after drinking a scotch. Patient found to have bilateral PNA. His wbc was 78295, hb 7.9. Patient started on broad spectrum abx, is now on po levaquin. Wbc 13000. He had egd by Dr Russella Dar with findings of severe candida esophagitis and duodenal bulb erosions. He required prbc transfusion.   Discharge Medications Current Discharge Medication List    START taking these medications   Details  amLODipine (NORVASC) 5 MG tablet Take 1 tablet (5 mg total) by mouth daily. Qty: 30 tablet, Refills: 0    folic acid (FOLVITE) 1 MG tablet Take 1 tablet (1 mg total) by mouth daily.    levofloxacin (LEVAQUIN) 750 MG tablet Take 1 tablet (750 mg total) by mouth daily. Qty: 4 tablet, Refills: 0    Multiple Vitamin (MULITIVITAMIN WITH MINERALS) TABS Take 1 tablet by mouth daily.    omeprazole (PRILOSEC OTC) 20 MG tablet Take 1 tablet (20 mg total) by mouth daily. Qty: 30 tablet, Refills: 0    sennosides-docusate sodium (SENOKOT-S) 8.6-50 MG tablet Take 2 tablets by mouth daily as needed for constipation.    thiamine 100 MG tablet Take 1 tablet (100 mg total) by mouth daily.        Hospital Course: 1. Community acquired pneumonia versus? Aspiration pneumonia during an episode of alcohol intoxication: Patient has completed 3 days of intravenous Levaquin. He will be provided with an additional 4 days of oral Levaquin to complete a  seven-day course. Recommend repeat chest x-ray in a couple of weeks to insure resolution. 2. Upper gastrointestinal bleeding, secondary to severe grade 4 candida esophagitis and multiple erosions in the duodenal bulb: Gastroenterology was consulted and performed EGD. Patient is advised abstinence from alcohol and tobacco. He will remain on high-dose PPI and treat with Diflucan. 3. Alcohol dependence: Patient was advised abstinence and is on multivitamins. He does not demonstrate any further features of withdrawal. 4. Acute post hemorrhagic anemia secondary to GI bleeding: Patient is status post packed red blood cell transfusion and stable 5. COPD: Patient indicates that he has chronic almost daily cough. He denies any dyspnea or wheezing at this time. 6. Tobacco abuse: Cessation counseling done. 7. Leukocytosis: Secondary to pneumonia and stress of GI bleed. 8. Homelessness: Patient was living in a tent with his friend prior to hospital admission. He indicates that post discharge he wants to return to his stent and is not interested in going to a homeless shelter. Clinical social worker has been consulted to discuss options. 9. Constipation: Advised fiber in the diet and when necessary senna. 10. Hypertension: Started on amlodipine. Advised compliance.   Day of Discharge BP 152/76  Pulse 84  Temp(Src) 99.5 F (37.5 C) (Oral)  Resp 18  Ht 5\' 7"  (1.702 m)  Wt 58.968 kg (130 lb)  BMI 20.36 kg/m2  SpO2 97% General exam: Comfortably lying supine in bed in no obvious distress. Respiratory system: Occasional basal crackles but rest of the lung fields are clear without any wheezing  or rhonchi. No increased work of breathing. Cardiovascular system: First and second heart sounds heard, regular. No JVD or murmur. Gastrointestinal system: Abdomen is nondistended, soft and normal bowel sounds are heard. Central nervous system: Alert and oriented. No focal neurological deficits.   Results for orders  placed during the hospital encounter of 02/02/11 (from the past 48 hour(s))  PREPARE RBC (CROSSMATCH)     Status: Normal   Collection Time   02/04/11  7:00 PM      Component Value Range Comment   Order Confirmation ORDER PROCESSED BY BLOOD BANK     CBC     Status: Abnormal   Collection Time   02/05/11  5:05 AM      Component Value Range Comment   WBC 13.3 (*) 4.0 - 10.5 (K/uL)    RBC 3.90 (*) 4.22 - 5.81 (MIL/uL)    Hemoglobin 10.4 (*) 13.0 - 17.0 (g/dL)    HCT 16.1 (*) 09.6 - 52.0 (%)    MCV 81.0  78.0 - 100.0 (fL)    MCH 26.7  26.0 - 34.0 (pg)    MCHC 32.9  30.0 - 36.0 (g/dL)    RDW 04.5 (*) 40.9 - 15.5 (%)    Platelets 355  150 - 400 (K/uL)   BASIC METABOLIC PANEL     Status: Abnormal   Collection Time   02/05/11  5:05 AM      Component Value Range Comment   Sodium 135  135 - 145 (mEq/L)    Potassium 3.7  3.5 - 5.1 (mEq/L)    Chloride 105  96 - 112 (mEq/L)    CO2 21  19 - 32 (mEq/L)    Glucose, Bld 116 (*) 70 - 99 (mg/dL)    BUN 9  6 - 23 (mg/dL)    Creatinine, Ser 8.11  0.50 - 1.35 (mg/dL)    Calcium 8.7  8.4 - 10.5 (mg/dL)    GFR calc non Af Amer 80 (*) >90 (mL/min)    GFR calc Af Amer >90  >90 (mL/min)   CBC     Status: Abnormal   Collection Time   02/06/11  4:08 AM      Component Value Range Comment   WBC 12.0 (*) 4.0 - 10.5 (K/uL)    RBC 3.90 (*) 4.22 - 5.81 (MIL/uL)    Hemoglobin 10.1 (*) 13.0 - 17.0 (g/dL)    HCT 91.4 (*) 78.2 - 52.0 (%)    MCV 80.0  78.0 - 100.0 (fL)    MCH 25.9 (*) 26.0 - 34.0 (pg)    MCHC 32.4  30.0 - 36.0 (g/dL)    RDW 95.6 (*) 21.3 - 15.5 (%)    Platelets 316  150 - 400 (K/uL)   BASIC METABOLIC PANEL     Status: Abnormal   Collection Time   02/06/11  4:08 AM      Component Value Range Comment   Sodium 132 (*) 135 - 145 (mEq/L)    Potassium 3.6  3.5 - 5.1 (mEq/L)    Chloride 100  96 - 112 (mEq/L)    CO2 25  19 - 32 (mEq/L)    Glucose, Bld 94  70 - 99 (mg/dL)    BUN 12  6 - 23 (mg/dL)    Creatinine, Ser 0.86  0.50 - 1.35 (mg/dL)      Calcium 8.7  8.4 - 10.5 (mg/dL)    GFR calc non Af Amer 65 (*) >90 (mL/min)    GFR calc Af Denyse Dago  75 (*) >90 (mL/min)     Dg Chest 2 View  02/02/2011  *RADIOLOGY REPORT*  Clinical Data: Chronic back pain.  Fall.  CHEST - 2 VIEW  Comparison: Chest radiograph 10/09/2010.  Findings: There is diffuse bilateral airspace disease of the perihilar and basilar predominant distribution.  This is most compatible with pulmonary edema and mild CHF.  Cardiomegaly is present.  Aortic arch atherosclerosis.  Multi focal pneumonia is another differential consideration however the symmetry favors CHF. Healed fracture of the left clavicle.  No pleural effusion is identified.  Radiographic follow-up is recommended to ensure clearing and exclude an underlying lesion.  IMPRESSION: Bilateral basilar predominant airspace disease and cardiomegaly are most compatible with mild CHF.  Multifocal pneumonia given secondary consideration.  Original Report Authenticated By: Andreas Newport, M.D.   Dg Lumbar Spine Complete  02/02/2011  *RADIOLOGY REPORT*  Clinical Data: Back pain.  LUMBAR SPINE - COMPLETE 4+ VIEW  Comparison: 03/02/2010.  Findings: Chronic L2 compression fractures present with slightly greater than 50% loss of vertebral body height.  The alignment and compression fracture appear unchanged compared to the prior exam. Abdominal aortic aneurysm is present.  Aortoiliac atherosclerosis is present.  No new lumbar compression fracture is identified. Lower lumbar facet arthrosis.  Osteopenia.  Trace anterolisthesis of L4 on L5 is present.  Sacrum appears intact.  Per CMS PQRS reporting requirements (PQRS Measure 24): Given the patient's age of greater than 50 and the fracture site (hip, distal radius, or spine), the patient should be tested for osteoporosis using DXA, and the appropriate treatment considered based on the DXA results.  IMPRESSION: Chronic L2 compression fracture.  No acute osseous abnormality.  Original Report  Authenticated By: Andreas Newport, M.D.   Dg Pelvis 1-2 Views  02/02/2011  *RADIOLOGY REPORT*  Clinical Data: Fall.  Pelvic pain.  PELVIS - 1-2 VIEW  Comparison: None.  Findings: Pelvic rings appear intact.  The patient is slightly oblique.  Atherosclerosis is noted.  Sacral arcades are obscured by overlying bowel gas.  Hip joint spaces appear symmetric. Osteopenia.  IMPRESSION: No acute osseous abnormality.  Original Report Authenticated By: Andreas Newport, M.D.   Ct Head Wo Contrast  02/02/2011  *RADIOLOGY REPORT*  Clinical Data: Short of breath, back pain and leg pain  CT HEAD WITHOUT CONTRAST  Technique:  Contiguous axial images were obtained from the base of the skull through the vertex without contrast.  Comparison: CT 10/31/2010  Findings: No acute intracranial hemorrhage.  No focal mass lesion. No CT evidence of acute infarction.   No midline shift or mass effect.  No hydrocephalus.  Basilar cisterns are patent.  There is generalized cortical atrophy and periventricular white matter hypodensities unchanged from prior. Paranasal sinuses and mastoid air cells are clear.  Orbits are normal.  IMPRESSION:  1.  No acute intracranial findings. 2.  Atrophy and microvascular disease. 3.  No change from prior.  Original Report Authenticated By: Genevive Bi, M.D.     Disposition: Patient is discharged in stable condition  Diet: Heart healthy  Activity: Increase activity gradually   Follow-up Appts: Discharge Orders    Future Orders Please Complete By Expires   Diet - low sodium heart healthy      Increase activity slowly      Call MD for:  difficulty breathing, headache or visual disturbances      Call MD for:  temperature >100.4         TESTS THAT NEED FOLLOW-UP Chest x-ray in a couple of weeks  to insure resolution of pneumonia.  Time spent on discharge, talking to the patient, and coordinating care: 30 mins.  Addendum: Patient will be switched to oral doxycycline instead of  Levaquin on discharge.  Addendum: Patient did not wait for all prescriptions or assistance with meds and left the hospital, despite nursing trying to ask him to wait until full paperwork was complete.  SignedMarcellus Scott, MD 02/06/2011, 1:32 PM

## 2011-02-06 NOTE — Op Note (Signed)
[facility] [address] [place], [place]  [address]  OPERATIVE PROCEDURE REPORT  PATIENT:  Dale Mcfarland, Dale Mcfarland  MR#:  [id number] BIRTHDATE:  Apr 08, 1929  GENDER:  male ENDOSCOPIST:  Dr.Jyothi Anastasio Auerbach ASSISTANT:  Windell Hummingbird, technician and Claudie Revering, RN [facility].  PROCEDURE DATE:  02/03/2011 PRE-PROCEDURE PREPERATION:  Patient fasted for 4 hours prior to procedure. PRE-PROCEDURE PHYSICAL:  Patient has stable vital signs. Neck is supple. There is no JVD, thyromegaly or LAD. Chest clear to auscultation. S1 and S2 regular.Decreased breath sounds at the base. Abdomen soft, non-distended, non-tender with NABS. PROCEDURE:  EGD with esophageal brushings. ASA CLASS:  Class II INDICATIONS:  1) Iron deficiency anemia 2) Coffee gound emesis 3) Alcohol abuse. MEDICATIONS:  Fentanyl 50 mcg & Versed 5 mg IV. TOPICAL ANESTHETIC:  Cetacaine spray x 2.  DESCRIPTION OF PROCEDURE: After the risks benefits and alternatives of the procedure were thoroughly explained, informed consent was obtained. The Pentax video gastroscope [id number] was introduced through the mouth and advanced to the second portion of the duodenum, without limitations. The instrument was slowly withdrawn as the mucosa was fully examined. <<PROCEDUREIMAGES>>  There was severe Grade IV esophagitis noted from 22-38 cm; brushings were done to rule out esophageal candidiasis. There may be a component of reflux esophagitis was well. The stomach and the proximal small bowel appeared normal except for a few erosions in the duodenal bulb. There were no ulcers, masses or polyps noted. Retroflexed views revealed a moderate sized hiatal hernia. There as n evidence of varices. The scope was then withdrawn from the patient and the procedure terminated. The patient tolerated the procedure without immediate complications.  IMPRESSION:  1) Severe Grade IV esophagitis from 22-38 cm-esophageal brushings done. 2) Moderate sized hiatal  hernia. 3) Multiple erosions in the dudenal bulb.  RECOMMENDATIONS:  1) Anti-reflux regimen to be followed. 2) Avoid all NSAIDS for now. 3) Continue high dose PPI's. 4) Low residue diet. 5) Start Diflucan for esophageal candidiasis.  REPEAT EXAM:  None planned for now.  DISCHARGE INSTRUCTIONS: Standard discharge instructions given.  ______________________________ Dr.Jyothi Anastasio Auerbach  CPT CODES: 82956  DIAGNOSIS CODES:  578.0, 280.9, 553.3  CC:   THP  n. eSIGNED:   Dr.Jyothi N. Mann at 02/03/2011 03:27 PM  Roselyn Reef, [phone]

## 2011-02-06 NOTE — Progress Notes (Signed)
No system changes this shift, no c/o pain, resp. Distress.

## 2011-02-08 LAB — CULTURE, BLOOD (ROUTINE X 2)
Culture  Setup Time: 201212221853
Culture: NO GROWTH

## 2011-02-27 ENCOUNTER — Other Ambulatory Visit: Payer: Self-pay

## 2011-02-27 ENCOUNTER — Encounter (HOSPITAL_COMMUNITY): Payer: Self-pay | Admitting: *Deleted

## 2011-02-27 ENCOUNTER — Emergency Department (HOSPITAL_COMMUNITY): Payer: Medicare Other

## 2011-02-27 ENCOUNTER — Inpatient Hospital Stay (HOSPITAL_COMMUNITY): Payer: Medicare Other

## 2011-02-27 ENCOUNTER — Inpatient Hospital Stay (HOSPITAL_COMMUNITY)
Admission: EM | Admit: 2011-02-27 | Discharge: 2011-03-05 | DRG: 291 | Discharge status: Against Medical Advice | Payer: Medicare Other | Attending: Internal Medicine | Admitting: Internal Medicine

## 2011-02-27 DIAGNOSIS — I6529 Occlusion and stenosis of unspecified carotid artery: Secondary | ICD-10-CM | POA: Diagnosis present

## 2011-02-27 DIAGNOSIS — K625 Hemorrhage of anus and rectum: Secondary | ICD-10-CM

## 2011-02-27 DIAGNOSIS — F3289 Other specified depressive episodes: Secondary | ICD-10-CM | POA: Diagnosis present

## 2011-02-27 DIAGNOSIS — F17200 Nicotine dependence, unspecified, uncomplicated: Secondary | ICD-10-CM | POA: Diagnosis present

## 2011-02-27 DIAGNOSIS — I428 Other cardiomyopathies: Secondary | ICD-10-CM | POA: Diagnosis present

## 2011-02-27 DIAGNOSIS — D649 Anemia, unspecified: Secondary | ICD-10-CM | POA: Diagnosis present

## 2011-02-27 DIAGNOSIS — M549 Dorsalgia, unspecified: Secondary | ICD-10-CM | POA: Diagnosis present

## 2011-02-27 DIAGNOSIS — K859 Acute pancreatitis without necrosis or infection, unspecified: Secondary | ICD-10-CM | POA: Diagnosis present

## 2011-02-27 DIAGNOSIS — J449 Chronic obstructive pulmonary disease, unspecified: Secondary | ICD-10-CM | POA: Diagnosis present

## 2011-02-27 DIAGNOSIS — Z880 Allergy status to penicillin: Secondary | ICD-10-CM

## 2011-02-27 DIAGNOSIS — F329 Major depressive disorder, single episode, unspecified: Secondary | ICD-10-CM | POA: Diagnosis present

## 2011-02-27 DIAGNOSIS — R531 Weakness: Secondary | ICD-10-CM

## 2011-02-27 DIAGNOSIS — F1020 Alcohol dependence, uncomplicated: Secondary | ICD-10-CM | POA: Diagnosis present

## 2011-02-27 DIAGNOSIS — I509 Heart failure, unspecified: Principal | ICD-10-CM | POA: Diagnosis present

## 2011-02-27 DIAGNOSIS — G8929 Other chronic pain: Secondary | ICD-10-CM | POA: Diagnosis present

## 2011-02-27 DIAGNOSIS — D62 Acute posthemorrhagic anemia: Secondary | ICD-10-CM

## 2011-02-27 DIAGNOSIS — E860 Dehydration: Secondary | ICD-10-CM

## 2011-02-27 DIAGNOSIS — K8020 Calculus of gallbladder without cholecystitis without obstruction: Secondary | ICD-10-CM | POA: Diagnosis present

## 2011-02-27 DIAGNOSIS — R55 Syncope and collapse: Secondary | ICD-10-CM

## 2011-02-27 DIAGNOSIS — Z5900 Homelessness unspecified: Secondary | ICD-10-CM

## 2011-02-27 DIAGNOSIS — J4489 Other specified chronic obstructive pulmonary disease: Secondary | ICD-10-CM | POA: Diagnosis present

## 2011-02-27 DIAGNOSIS — Z8249 Family history of ischemic heart disease and other diseases of the circulatory system: Secondary | ICD-10-CM

## 2011-02-27 HISTORY — DX: Gastrointestinal hemorrhage, unspecified: K92.2

## 2011-02-27 HISTORY — DX: Other specified postprocedural states: Z98.890

## 2011-02-27 HISTORY — DX: Homelessness unspecified: Z59.00

## 2011-02-27 HISTORY — DX: Homelessness: Z59.0

## 2011-02-27 HISTORY — DX: Hypo-osmolality and hyponatremia: E87.1

## 2011-02-27 HISTORY — DX: Essential (primary) hypertension: I10

## 2011-02-27 HISTORY — DX: Other specified postprocedural states: R11.2

## 2011-02-27 HISTORY — DX: Anemia, unspecified: D64.9

## 2011-02-27 LAB — URINALYSIS, ROUTINE W REFLEX MICROSCOPIC
Nitrite: NEGATIVE
Protein, ur: 30 mg/dL — AB
Specific Gravity, Urine: 1.021 (ref 1.005–1.030)
Urobilinogen, UA: 1 mg/dL (ref 0.0–1.0)

## 2011-02-27 LAB — RAPID URINE DRUG SCREEN, HOSP PERFORMED
Barbiturates: NOT DETECTED
Benzodiazepines: NOT DETECTED

## 2011-02-27 LAB — GLUCOSE, CAPILLARY

## 2011-02-27 LAB — PROTIME-INR
INR: 1.04 (ref 0.00–1.49)
Prothrombin Time: 13.8 seconds (ref 11.6–15.2)

## 2011-02-27 LAB — PRO B NATRIURETIC PEPTIDE: Pro B Natriuretic peptide (BNP): 807 pg/mL — ABNORMAL HIGH (ref 0–450)

## 2011-02-27 LAB — COMPREHENSIVE METABOLIC PANEL
ALT: 14 U/L (ref 0–53)
Alkaline Phosphatase: 129 U/L — ABNORMAL HIGH (ref 39–117)
BUN: 16 mg/dL (ref 6–23)
CO2: 25 mEq/L (ref 19–32)
GFR calc Af Amer: >90 mL/min (ref >90)
GFR calc non Af Amer: 82 mL/min — ABNORMAL LOW (ref >90)
Glucose, Bld: 97 mg/dL (ref 70–99)
Potassium: 4.3 mEq/L (ref 3.5–5.1)
Sodium: 135 mEq/L (ref 135–145)

## 2011-02-27 LAB — POCT I-STAT TROPONIN I: Troponin i, poc: 0.01 ng/mL (ref 0.00–0.08)

## 2011-02-27 LAB — CBC
MCHC: 31.8 g/dL (ref 30.0–36.0)
Platelets: 338 10*3/uL (ref 150–400)
RDW: 15.5 % (ref 11.5–15.5)
WBC: 12.4 10*3/uL — ABNORMAL HIGH (ref 4.0–10.5)

## 2011-02-27 LAB — OCCULT BLOOD, POC DEVICE: Fecal Occult Bld: POSITIVE

## 2011-02-27 LAB — LIPASE, BLOOD: Lipase: 99 U/L — ABNORMAL HIGH (ref 11–59)

## 2011-02-27 LAB — URINE MICROSCOPIC-ADD ON

## 2011-02-27 LAB — ETHANOL: Alcohol, Ethyl (B): <11 mg/dL (ref 0–11)

## 2011-02-27 LAB — AMMONIA: Ammonia: 12 umol/L (ref 11–60)

## 2011-02-27 MED ORDER — ONDANSETRON HCL 4 MG PO TABS: 4.0000 mg | ORAL_TABLET | Freq: Four times a day (QID) | ORAL | Status: DC | PRN

## 2011-02-27 MED ORDER — DEXTROSE-NACL 5-0.45 % IV SOLN
INTRAVENOUS | Status: DC
  Administered 2011-02-27 – 2011-02-28 (×2): via INTRAVENOUS

## 2011-02-27 MED ORDER — SODIUM CHLORIDE 0.9 % IV SOLN
INTRAVENOUS | Status: DC
  Administered 2011-02-27: 18:00:00 via INTRAVENOUS

## 2011-02-27 MED ORDER — MORPHINE SULFATE 2 MG/ML IJ SOLN
1.0000 mg | INTRAMUSCULAR | Status: DC | PRN
  Filled 2011-02-27: qty 2
  Filled 2011-02-27: qty 1

## 2011-02-27 MED ORDER — THIAMINE HCL 100 MG/ML IJ SOLN
100.0000 mg | Freq: Every day | INTRAMUSCULAR | Status: DC
  Administered 2011-02-27 – 2011-03-03 (×5): 100 mg via INTRAVENOUS
  Filled 2011-02-27 (×6): qty 1

## 2011-02-27 MED ORDER — HYDROCODONE-ACETAMINOPHEN 5-325 MG PO TABS
1.0000 | ORAL_TABLET | ORAL | Status: DC | PRN
Start: 2011-02-27 — End: 2011-03-05

## 2011-02-27 MED ORDER — ONDANSETRON HCL 4 MG/2ML IJ SOLN: 4.0000 mg | Freq: Four times a day (QID) | INTRAMUSCULAR | Status: DC | PRN

## 2011-02-27 MED ORDER — ACETAMINOPHEN 650 MG RE SUPP: 650.0000 mg | Freq: Four times a day (QID) | RECTAL | Status: DC | PRN

## 2011-02-27 MED ORDER — MORPHINE SULFATE 4 MG/ML IJ SOLN: 4.0000 mg | Freq: Once | INTRAMUSCULAR | Status: DC

## 2011-02-27 MED ORDER — ONDANSETRON HCL 4 MG/2ML IJ SOLN: 4.0000 mg | Freq: Three times a day (TID) | INTRAMUSCULAR | Status: DC | PRN

## 2011-02-27 MED ORDER — ACETAMINOPHEN 325 MG PO TABS: 650.0000 mg | ORAL_TABLET | Freq: Four times a day (QID) | ORAL | Status: DC | PRN

## 2011-02-27 MED ORDER — PANTOPRAZOLE SODIUM 40 MG IV SOLR
40.0000 mg | Freq: Two times a day (BID) | INTRAVENOUS | Status: DC
  Administered 2011-02-27 – 2011-03-01 (×4): 40 mg via INTRAVENOUS
  Filled 2011-02-27 (×5): qty 40

## 2011-02-27 MED ORDER — FOLIC ACID 5 MG/ML IJ SOLN
1.0000 mg | Freq: Every day | INTRAMUSCULAR | Status: DC
  Administered 2011-02-27 – 2011-03-03 (×5): 1 mg via INTRAVENOUS
  Filled 2011-02-27 (×6): qty 0.2

## 2011-02-27 MED ORDER — SODIUM CHLORIDE 0.9 % IV BOLUS (SEPSIS)
500.0000 mL | Freq: Once | INTRAVENOUS | Status: AC
  Administered 2011-02-27: 500 mL via INTRAVENOUS

## 2011-02-27 NOTE — ED Notes (Signed)
Pt reports feeling dizzy since this am. Pt is poor historian. States he did not pass out. Pt with hx of pancreatitis and copd. Pt denies chest pain. Pt with chronic sob, states no more then usual. 20g(L)posteriorFA.

## 2011-02-27 NOTE — ED Notes (Signed)
Attempted to call report to [phone]. State can not take report at this time but will call me back at [phone]

## 2011-02-27 NOTE — ED Provider Notes (Signed)
History     CSN: [id number]  Arrival date & time 02/27/11  1110   First MD Initiated Contact with Patient 02/27/11 1120      Chief Complaint  Patient presents with  . Near Syncope    (Consider location/radiation/quality/duration/timing/severity/associated sxs/prior treatment) The history is provided by the patient. The history is limited by the condition of the patient.  Level 5 caveat applies due to altered mental status. Pt tells me he passed out earlier today and fell onto his knees. Felt dizzy before the fall. Drinks alcohol "whenever I have money", but denies consumption today.   Past Medical History  Diagnosis Date  . COPD (chronic obstructive pulmonary disease)   . Chronic back pain   . Pneumonia   . Depression   . Blood transfusion     Past Surgical History  Procedure Date  . Tonsillectomy   . Vasectomy   . Appendectomy   . Tonsillectomy   . Colonoscopy   . Esophagogastroduodenoscopy   . Esophagogastroduodenoscopy 02/03/2011    Procedure: ESOPHAGOGASTRODUODENOSCOPY (EGD);  Surgeon: Charna Elizabeth, MD;  Location: [facility] ENDOSCOPY;  Service: Endoscopy;  Laterality: N/A;  . Esophagogastroduodenoscopy 02/03/2011    Procedure: ESOPHAGOGASTRODUODENOSCOPY (EGD);  Surgeon: Charna Elizabeth, MD;  Location: [facility] ENDOSCOPY;  Service: Endoscopy;  Laterality: N/A;    History reviewed. No pertinent family history.  History  Substance Use Topics  . Smoking status: Current Some Day Smoker -- 1.0 packs/day    Types: Cigarettes  . Smokeless tobacco: Not on file  . Alcohol Use: Yes     174 ml of scoth weekly      Review of Systems  Unable to perform ROS: Other  Constitutional: Positive for chills. Negative for fever.  HENT: Negative for sore throat, neck pain, neck stiffness and tinnitus.   Eyes: Negative for pain and visual disturbance.  Respiratory: Positive for shortness of breath. Negative for cough and chest tightness.   Cardiovascular: Negative for chest pain.    Gastrointestinal: Positive for nausea, vomiting, diarrhea and abdominal distention. Negative for blood in stool.  Genitourinary: Negative for dysuria and flank pain.  Musculoskeletal: Positive for back pain and gait problem.  Skin: Negative for rash and wound.  Neurological: Positive for dizziness.  Psychiatric/Behavioral: Positive for confusion.    Allergies  Penicillins  Home Medications  No current outpatient prescriptions on file.  BP 134/73  Pulse 78  Temp(Src) 97.8 F (36.6 C) (Oral)  Resp 17  SpO2 99%  Physical Exam  Nursing note and vitals reviewed. Constitutional:       Disheveled, well-developed  HENT:  Head: Normocephalic and atraumatic.  Right Ear: External ear normal.  Left Ear: External ear normal.  Nose: Nose normal.       Mucous membranes dry  Eyes: Conjunctivae and EOM are normal. Pupils are equal, round, and reactive to light.       Right pupil cloudy  Neck: Normal range of motion. Neck supple. No tracheal deviation present.  Cardiovascular: Normal rate, regular rhythm and intact distal pulses.   No murmur heard. Pulmonary/Chest: Effort normal and breath sounds normal. No respiratory distress. He exhibits no tenderness.  Abdominal: Soft. Bowel sounds are normal. He exhibits no distension. There is no rebound and no guarding.       Epigastric TTP  Genitourinary: Penis normal. Guaiac positive stool.       Slightly decreased rectal tone (likely secondary to multiple surgeries to rectum in the past for hemorrhoids per pt)  Musculoskeletal: He exhibits no edema and  no tenderness.  Neurological: He is alert. He has normal reflexes. No cranial nerve deficit.       Oriented to person place, not time, vague orientation to events. F-N intact bilaterally  Skin: Skin is warm and dry. No rash noted. No erythema.    ED Course  Procedures (including critical care time)  Labs Reviewed  COMPREHENSIVE METABOLIC PANEL - Abnormal; Notable for the following:     Albumin 3.2 (*)    Alkaline Phosphatase 129 (*)    GFR calc non Af Amer 82 (*)    All other components within normal limits  CBC - Abnormal; Notable for the following:    WBC 12.4 (*)    Hemoglobin 11.3 (*)    HCT 35.5 (*)    MCH 25.9 (*)    All other components within normal limits  URINALYSIS, ROUTINE W REFLEX MICROSCOPIC - Abnormal; Notable for the following:    Ketones, ur 15 (*)    Protein, ur 30 (*)    Leukocytes, UA TRACE (*)    All other components within normal limits  LIPASE, BLOOD - Abnormal; Notable for the following:    Lipase 99 (*)    All other components within normal limits  PRO B NATRIURETIC PEPTIDE - Abnormal; Notable for the following:    Pro B Natriuretic peptide (BNP) 807.0 (*)    All other components within normal limits  URINE MICROSCOPIC-ADD ON - Abnormal; Notable for the following:    Casts HYALINE CASTS (*)    All other components within normal limits  ETHANOL  URINE RAPID DRUG SCREEN (HOSP PERFORMED)  OCCULT BLOOD, POC DEVICE  AMMONIA  PROTIME-INR  APTT  POCT I-STAT TROPONIN I  I-STAT TROPONIN I  OCCULT BLOOD X 1 CARD TO LAB, STOOL   Dg Chest 2 View  02/27/2011  *RADIOLOGY REPORT*  Clinical Data: Syncope, weakness, shortness of breath, smoker  CHEST - 2 VIEW  Comparison: 02/02/2011  Findings: Normal heart size, mediastinal contours, and pulmonary vascularity. Atherosclerotic calcification aorta. Peribronchial thickening. Perihilar infiltrates slightly greater on the right, question pulmonary edema/CHF. No pleural effusion or pneumothorax. Osseous demineralization. 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  IMPRESSION: Mild CHF.  Original Report Authenticated By: Lollie Marrow, M.D.   Ct Head Wo Contrast  02/27/2011  *RADIOLOGY REPORT*  Clinical Data: Dizziness and nausea since this morning.  Near- syncopal episode.  Possible trauma.  CT HEAD WITHOUT CONTRAST  Technique:  Contiguous axial images were obtained from the base of the skull through the  vertex without contrast.  Comparison: 02/02/2011  Findings: Bone windows demonstrate clear paranasal sinuses and mastoid air cells.  Soft tissue windows demonstrate moderate low density in the periventricular white matter, likely related to small vessel disease. Expected cerebral atrophy. No  mass lesion, hemorrhage, hydrocephalus, acute infarct, intra-axial, or extra-axial fluid collection.  IMPRESSION:  1. No acute intracranial abnormality. 2. Cerebral atrophy and small vessel ischemic change.  Original Report Authenticated By: Consuello Bossier, M.D.     1. Weakness   2. Dehydration   3. Pancreatitis   4. CHF (congestive heart failure)   5. Alcoholism   6. Rectal bleeding       MDM  11:50 Patient seen and evaluated. Initial history and physical exam completed. Multiple issues- full workup ordered to eval weakness and syncope. Gentle fluid rehydration given hx CHF.   2:21pm Labs and imaging studies reviewed. Evidence on pancreatitis, dehydration, CHF, rectal bleeding. Hospitalist paged for admission. Pt informed of plan.  [address] [place], [place] 03/01/11 [phone]

## 2011-02-27 NOTE — H&P (Signed)
Hospital Admission Note Date: 02/27/2011  PCP: No primary provider on file.  Chief Complaint: Pass out, abdominal pain.   History of Present Illness:78 year old present after syncope event. Patient relates that he stand up felt lightheaded, and then pass out. He denies chest pain or dyspnea, no bowel or urinary incontinence. Had some diarrhea today , 2 BM watery stool. He is also complaining of abdominal pain started today. No vomiting. Lipase was mildly increase.    Allergies: Penicillins Past Medical History  Diagnosis Date  . COPD (chronic obstructive pulmonary disease)   . Chronic back pain   . Pneumonia   . Depression   . Blood transfusion    Prior to Admission medications   Not on File   Past Surgical History  Procedure Date  . Tonsillectomy   . Vasectomy   . Appendectomy   . Tonsillectomy   . Colonoscopy   . Esophagogastroduodenoscopy   . Esophagogastroduodenoscopy 02/03/2011    Procedure: ESOPHAGOGASTRODUODENOSCOPY (EGD);  Surgeon: Charna Elizabeth, MD;  Location: [facility];  Service: Endoscopy;  Laterality: N/A;  . Esophagogastroduodenoscopy 02/03/2011    Procedure: ESOPHAGOGASTRODUODENOSCOPY (EGD);  Surgeon: Charna Elizabeth, MD;  Location: [facility];  Service: Endoscopy;  Laterality: N/A;   History reviewed. No pertinent family history. History   Social History  . Marital Status: Divorced    Spouse Name: N/A    Number of Children: N/A  . Years of Education: N/A   Occupational History  . Not on file.   Social History Main Topics  . Smoking status: Current Some Day Smoker -- 1.0 packs/day    Types: Cigarettes  . Smokeless tobacco: Not on file  . Alcohol Use: Yes     174 ml of scoth weekly  . Drug Use: No  . Sexually Active: Not Currently   Other Topics Concern  . Not on file   Social History Narrative  . No narrative on file   Review of Systems: Pertinent items are noted in HPI. Physical Exam: Filed Vitals:   02/27/11 1118 02/27/11 1305 02/27/11  1400 02/27/11 1713  BP: 162/70 134/73 93/63 152/81  Pulse: 78  80 88  Temp: 97.8 F (36.6 C)   98.8 F (37.1 C)  TempSrc: Oral   Oral  Resp: 20 17 12 14   Height:    5\' 7"  (1.702 m)  Weight:    58.7 kg (129 lb 6.6 oz)  SpO2: 99% 99% 98% 97%   No intake or output data in the 24 hours ending 02/27/11 1825 BP 152/81  Pulse 88  Temp(Src) 98.8 F (37.1 C) (Oral)  Resp 14  Ht 5\' 7"  (1.702 m)  Wt 58.7 kg (129 lb 6.6 oz)  BMI 20.27 kg/m2  SpO2 97%  General Appearance:    Alert, cooperative, no distress, appears stated age  Head:    Normocephalic, without obvious abnormality, atraumatic  Eyes:    PERRL, conjunctiva/corneas clear, EOM's intact,        Ears:    Normal TM's and external ear canals, both ears  Nose:   Nares normal, septum midline, mucosa normal, no drainage    or sinus tenderness  Throat:   Lips, mucosa, and tongue normal; teeth and gums normal  Neck:   Supple, symmetrical, trachea midline, no adenopathy;       thyroid:  No enlargement/tenderness/nodules; no carotid   bruit or JVD  Back:     Symmetric, no curvature, ROM normal, no CVA tenderness  Lungs:     Clear to  auscultation bilaterally, respirations unlabored     Heart:    Regular rate and rhythm, S1 and S2 normal, no murmur, rub   or gallop  Abdomen:     Soft, mild-tender, bowel sounds active all four quadrants,    no masses, no organomegaly.         Extremities:   Extremities normal, atraumatic, no cyanosis or edema  Pulses:   2+ and symmetric all extremities        Neurologic:   CNII-XII intact. Normal strength, sensation and reflexes      throughout   Lab results:  [facility] 02/27/11 1151  NA 135  K 4.3  CL 101  CO2 25  GLUCOSE 97  BUN 16  CREATININE 0.82  CALCIUM 9.0  MG --  PHOS --    Basename 02/27/11 1151  AST 21  ALT 14  ALKPHOS 129*  BILITOT 0.3  PROT 6.7  ALBUMIN 3.2*    Basename 02/27/11 1151  LIPASE 99*  AMYLASE --    Basename 02/27/11 1151  WBC 12.4*  NEUTROABS --    HGB 11.3*  HCT 35.5*  MCV 81.2  PLT 338   Imaging results:  Dg Chest 2 View  02/27/2011  *RADIOLOGY REPORT*  Clinical Data: Syncope, weakness, shortness of breath, smoker  CHEST - 2 VIEW  Comparison: 02/02/2011  Findings: Normal heart size, mediastinal contours, and pulmonary vascularity. Atherosclerotic calcification aorta. Peribronchial thickening. Perihilar infiltrates slightly greater on the right, question pulmonary edema/CHF. No pleural effusion or pneumothorax. Osseous demineralization. 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  IMPRESSION: Mild CHF.  Original Report Authenticated By: Lollie Marrow, M.D.   Dg Chest 2 View  02/02/2011  *RADIOLOGY REPORT*  Clinical Data: Chronic back pain.  Fall.  CHEST - 2 VIEW  Comparison: Chest radiograph 10/09/2010.  Findings: There is diffuse bilateral airspace disease of the perihilar and basilar predominant distribution.  This is most compatible with pulmonary edema and mild CHF.  Cardiomegaly is present.  Aortic arch atherosclerosis.  Multi focal pneumonia is another differential consideration however the symmetry favors CHF. Healed fracture of the left clavicle.  No pleural effusion is identified.  Radiographic follow-up is recommended to ensure clearing and exclude an underlying lesion.  IMPRESSION: Bilateral basilar predominant airspace disease and cardiomegaly are most compatible with mild CHF.  Multifocal pneumonia given secondary consideration.  Original Report Authenticated By: Andreas Newport, M.D.   Dg Lumbar Spine Complete  02/02/2011  *RADIOLOGY REPORT*  Clinical Data: Back pain.  LUMBAR SPINE - COMPLETE 4+ VIEW  Comparison: 03/02/2010.  Findings: Chronic L2 compression fractures present with slightly greater than 50% loss of vertebral body height.  The alignment and compression fracture appear unchanged compared to the prior exam. Abdominal aortic aneurysm is present.  Aortoiliac atherosclerosis is present.  No new lumbar compression fracture  is identified. Lower lumbar facet arthrosis.  Osteopenia.  Trace anterolisthesis of L4 on L5 is present.  Sacrum appears intact.  Per CMS PQRS reporting requirements (PQRS Measure 24): Given the patient's age of greater than 50 and the fracture site (hip, distal radius, or spine), the patient should be tested for osteoporosis using DXA, and the appropriate treatment considered based on the DXA results.  IMPRESSION: Chronic L2 compression fracture.  No acute osseous abnormality.  Original Report Authenticated By: Andreas Newport, M.D.   Dg Pelvis 1-2 Views  02/02/2011  *RADIOLOGY REPORT*  Clinical Data: Fall.  Pelvic pain.  PELVIS - 1-2 VIEW  Comparison: None.  Findings: Pelvic rings appear intact.  The patient is slightly oblique.  Atherosclerosis is noted.  Sacral arcades are obscured by overlying bowel gas.  Hip joint spaces appear symmetric. Osteopenia.  IMPRESSION: No acute osseous abnormality.  Original Report Authenticated By: Andreas Newport, M.D.   Ct Head Wo Contrast  02/27/2011  *RADIOLOGY REPORT*  Clinical Data: Dizziness and nausea since this morning.  Near- syncopal episode.  Possible trauma.  CT HEAD WITHOUT CONTRAST  Technique:  Contiguous axial images were obtained from the base of the skull through the vertex without contrast.  Comparison: 02/02/2011  Findings: Bone windows demonstrate clear paranasal sinuses and mastoid air cells.  Soft tissue windows demonstrate moderate low density in the periventricular white matter, likely related to small vessel disease. Expected cerebral atrophy. No  mass lesion, hemorrhage, hydrocephalus, acute infarct, intra-axial, or extra-axial fluid collection.  IMPRESSION:  1. No acute intracranial abnormality. 2. Cerebral atrophy and small vessel ischemic change.  Original Report Authenticated By: Consuello Bossier, M.D.   Ct Head Wo Contrast  02/02/2011  *RADIOLOGY REPORT*  Clinical Data: Short of breath, back pain and leg pain  CT HEAD WITHOUT CONTRAST   Technique:  Contiguous axial images were obtained from the base of the skull through the vertex without contrast.  Comparison: CT 10/31/2010  Findings: No acute intracranial hemorrhage.  No focal mass lesion. No CT evidence of acute infarction.   No midline shift or mass effect.  No hydrocephalus.  Basilar cisterns are patent.  There is generalized cortical atrophy and periventricular white matter hypodensities unchanged from prior. Paranasal sinuses and mastoid air cells are clear.  Orbits are normal.  IMPRESSION:  1.  No acute intracranial findings. 2.  Atrophy and microvascular disease. 3.  No change from prior.  Original Report Authenticated By: Genevive Bi, M.D.     Patient Active Hospital Problem List:  Pancreatitis:  Patient presents with abdominal pain, has mild increase lipase. I will check RUQ [place] has mild increase alkaline phosphatase. NPO. IV fluids. Protonix.   Alcoholism (02/02/2011)  No history of DT, Last alcohol drink last Saturday. I will start thiamine and folate.  Dehydration (02/27/2011) Continue with gentle hydration.   Syncope (02/27/2011) Probably 2 to decrease volume, vs vaso vagal.  I will check orthostatic. Continue with IV fluids. Admit to telemetry to monitor for arrythmia. I will check ECHO. Neuro exam non focal. CT head negative.  Chest x ray: mild CHF, patient denies dyspnea. BNP lower than before. Monitor Sat.      Shajuana Mclucas M.D. [facility] [phone] 02/27/2011, 6:25 PM

## 2011-02-27 NOTE — ED Notes (Signed)
Pt reports dizziness with nausea this am while walking to the store. Pt denies chest pain. Pt states he passed out when he got to the store. PERRLA. Pt is alcoholic and drinks everyday. Pt on monitor. Pt cleaned and aware of poc.pt denies abdominal pain or headache. Pt reports chronic back pain from fall.

## 2011-02-27 NOTE — ED Notes (Signed)
Attempted in/out cath per Dr. Clarene Duke for urine specimen and did not get any urine returned.

## 2011-02-27 NOTE — ED Notes (Signed)
PA locating EKG.  EKG being obtained

## 2011-02-27 NOTE — ED Notes (Addendum)
Pt given water. Aware urine is needed, still hasn't had to go.

## 2011-02-27 NOTE — ED Notes (Signed)
[id number] Ready 

## 2011-02-27 NOTE — ED Notes (Signed)
Report given to yellow RN pending admission, pt is aware of poc

## 2011-02-28 DIAGNOSIS — K8020 Calculus of gallbladder without cholecystitis without obstruction: Secondary | ICD-10-CM

## 2011-02-28 DIAGNOSIS — R109 Unspecified abdominal pain: Secondary | ICD-10-CM

## 2011-02-28 DIAGNOSIS — K859 Acute pancreatitis without necrosis or infection, unspecified: Secondary | ICD-10-CM

## 2011-02-28 LAB — COMPREHENSIVE METABOLIC PANEL
ALT: 11 U/L (ref 0–53)
Alkaline Phosphatase: 116 U/L (ref 39–117)
CO2: 22 mEq/L (ref 19–32)
GFR calc Af Amer: >90 mL/min (ref >90)
GFR calc non Af Amer: 81 mL/min — ABNORMAL LOW (ref >90)
Glucose, Bld: 90 mg/dL (ref 70–99)
Potassium: 3.6 mEq/L (ref 3.5–5.1)
Sodium: 134 mEq/L — ABNORMAL LOW (ref 135–145)
Total Bilirubin: 0.4 mg/dL (ref 0.3–1.2)

## 2011-02-28 LAB — CARDIAC PANEL(CRET KIN+CKTOT+MB+TROPI)
CK, MB: 3.5 ng/mL (ref 0.3–4.0)
Total CK: 128 U/L (ref 7–232)
Troponin I: <0.3 ng/mL (ref <0.30)

## 2011-02-28 LAB — CBC
MCHC: 31.6 g/dL (ref 30.0–36.0)
RDW: 15.5 % (ref 11.5–15.5)

## 2011-02-28 LAB — PROTIME-INR
INR: 1.06 (ref 0.00–1.49)
Prothrombin Time: 14 seconds (ref 11.6–15.2)

## 2011-02-28 LAB — GLUCOSE, CAPILLARY: Glucose-Capillary: 91 mg/dL (ref 70–99)

## 2011-02-28 NOTE — Progress Notes (Signed)
Utilization review completed.  

## 2011-02-28 NOTE — Consult Note (Signed)
Reason for Consult: Cholelithiasis, mild pancreatitis Referring Physician: Marquette Saa is an 78 y.o. male.  HPI: Patient's 78 year old white male who was admitted on 02/26/2010 by the hospitalist service he presented after an episode of syncope. He reports he was walking to a house where they serve meals. He says his legs just gave out. He's had this happen one time before. He says when he awoke he was a little confused at the couple hours for him to get back to normal. He reports he had diarrhea prior to his syncopal episode. He reports at least 2 loose stools. He also complained of abdominal pain on admission. He has a history of intermittent abdominal pain for many years. He currently complains of pain only in the left lower quadrant. He says it hurts him some now. He underwent extensive workup in the emergency room. His white count was 12,400, H./H. was 11.3 and 35.5. His electrolytes are normal his LFTs are normal, lipase was 99. Chest x-ray shows mild congestive failure. CK troponins were negative. CT of the head shows no intracranial abnormality cerebral atrophy and small vessel ischemic changes. Gallbladder ultrasound shows multiple gallstones within the gallbladder the largest measuring 8 mm there's no gallbladder wall thickening or paracolic cystic fluid the Murphy's sign was negative. Common bile duct was normal at 4.6 mm. Liver showed no masses or other abnormality to the pancreas appeared normal.  since admission: His white count returned to normal at 7.3 hemoglobin and hematocrit were 10.4 and 32.9 respectively platelets 302K, lipase is back to 50. We were asked to see patient in light of his mildly elevated lipase, and cholelithiasis. His last hospitalization 12/22-12/26 for pneumonia upper GI bleed showing severe Candida esophagitis and duodenal erosions requiring 1 unit of packed cells. Community-acquired pneumonia versus aspiration pneumonia with alcohol intoxication.  Past  Medical History  Diagnosis Date  . COPD (chronic obstructive pulmonary disease)   . Chronic back pain   . Pneumonia   . Depression   . Blood transfusion   . PONV (postoperative nausea and vomiting)   . Shortness of breath    GI bleed with esophagitis and duodenal erosions 12/22-26/12 +EGD Community-acquired pneumonia versus aspiration pneumonia from alcohol use 01/2011. Memory issues  Past Surgical History  Procedure Date  . Tonsillectomy   . Vasectomy   . Appendectomy   . Tonsillectomy   . Colonoscopy   . Esophagogastroduodenoscopy   . Esophagogastroduodenoscopy 02/03/2011    Procedure: ESOPHAGOGASTRODUODENOSCOPY (EGD);  Surgeon: Charna Elizabeth, MD;  Location: [facility];  Service: Endoscopy;  Laterality: N/A;  . Esophagogastroduodenoscopy 02/03/2011    Procedure: ESOPHAGOGASTRODUODENOSCOPY (EGD);  Surgeon: Charna Elizabeth, MD;  Location: [facility];  Service: Endoscopy;  Laterality: N/A;    History reviewed. No pertinent family history.  Social History:  reports that he has been smoking Cigarettes.  He has a 60 pack-year smoking history. He has never used smokeless tobacco. He reports that he drinks alcohol. He reports that he does not use illicit drugs. patient has lived in details in detail since the early 69s. He lives in attendance for the last year. Tobacco one pack per day. Alcohol: Approximately a pint per week. He reports  2 daughters and [place].  Allergies:  Allergies  Allergen Reactions  . Penicillins Itching    Medications:  Prior to Admission: Meds on discharge 02/06/2011: Norvasc 5 mg daily, Folvite 1 mg, Levaquin 750 mg daily a multivitamin, 1 Prilosec 20 mg daily, Senokot S1 Daily, thiamine 100 mg daily. No  prescriptions prior to admission   Scheduled:   . folic acid  1 mg Intravenous Daily  . pantoprazole (PROTONIX) IV  40 mg Intravenous Q12H  . sodium chloride  500 mL Intravenous Once  . thiamine  100 mg Intravenous Daily  . DISCONTD: sodium  chloride   Intravenous STAT  . DISCONTD:  morphine injection  4 mg Intravenous Once   Continuous:   . dextrose 5 % and 0.45% NaCl 50 mL/hr at 02/27/11 2000   KGM:[id number], acetaminophen, HYDROcodone-acetaminophen, morphine, ondansetron (ZOFRAN) IV, ondansetron, DISCONTD: ondansetron (ZOFRAN) IV  Results for orders placed during the hospital encounter of 02/27/11 (from the past 48 hour(s))  COMPREHENSIVE METABOLIC PANEL     Status: Abnormal   Collection Time   02/27/11 11:51 AM      Component Value Range Comment   Sodium 135  135 - 145 (mEq/L)    Potassium 4.3  3.5 - 5.1 (mEq/L)    Chloride 101  96 - 112 (mEq/L)    CO2 25  19 - 32 (mEq/L)    Glucose, Bld 97  70 - 99 (mg/dL)    BUN 16  6 - 23 (mg/dL)    Creatinine, Ser 3.47  0.50 - 1.35 (mg/dL)    Calcium 9.0  8.4 - 10.5 (mg/dL)    Total Protein 6.7  6.0 - 8.3 (g/dL)    Albumin 3.2 (*) 3.5 - 5.2 (g/dL)    AST 21  0 - 37 (U/L)    ALT 14  0 - 53 (U/L)    Alkaline Phosphatase 129 (*) 39 - 117 (U/L)    Total Bilirubin 0.3  0.3 - 1.2 (mg/dL)    GFR calc non Af Amer 82 (*) >90 (mL/min)    GFR calc Af Amer >90  >90 (mL/min)   CBC     Status: Abnormal   Collection Time   02/27/11 11:51 AM      Component Value Range Comment   WBC 12.4 (*) 4.0 - 10.5 (K/uL)    RBC 4.37  4.22 - 5.81 (MIL/uL)    Hemoglobin 11.3 (*) 13.0 - 17.0 (g/dL)    HCT 42.5 (*) 95.6 - 52.0 (%)    MCV 81.2  78.0 - 100.0 (fL)    MCH 25.9 (*) 26.0 - 34.0 (pg)    MCHC 31.8  30.0 - 36.0 (g/dL)    RDW 38.7  56.4 - 33.2 (%)    Platelets 338  150 - 400 (K/uL)   LIPASE, BLOOD     Status: Abnormal   Collection Time   02/27/11 11:51 AM      Component Value Range Comment   Lipase 99 (*) 11 - 59 (U/L)   ETHANOL     Status: Normal   Collection Time   02/27/11 11:51 AM      Component Value Range Comment   Alcohol, Ethyl (B) <11  0 - 11 (mg/dL)   OCCULT BLOOD, POC DEVICE     Status: Normal   Collection Time   02/27/11 12:01 PM      Component Value Range Comment    Fecal Occult Bld POSITIVE     AMMONIA     Status: Normal   Collection Time   02/27/11 12:12 PM      Component Value Range Comment   Ammonia 12  11 - 60 (umol/L)   POCT I-STAT TROPONIN I     Status: Normal   Collection Time   02/27/11 12:15 PM      Component  Value Range Comment   Troponin i, poc 0.01  0.00 - 0.08 (ng/mL)    Comment 3            PROTIME-INR     Status: Normal   Collection Time   02/27/11  1:08 PM      Component Value Range Comment   Prothrombin Time 13.8  11.6 - 15.2 (seconds)    INR 1.04  0.00 - 1.49    APTT     Status: Normal   Collection Time   02/27/11  1:08 PM      Component Value Range Comment   aPTT 32  24 - 37 (seconds)   PRO B NATRIURETIC PEPTIDE     Status: Abnormal   Collection Time   02/27/11  3:05 PM      Component Value Range Comment   Pro B Natriuretic peptide (BNP) 807.0 (*) 0 - 450 (pg/mL)   URINALYSIS, ROUTINE W REFLEX MICROSCOPIC     Status: Abnormal   Collection Time   02/27/11  3:07 PM      Component Value Range Comment   Color, Urine YELLOW  YELLOW     APPearance CLEAR  CLEAR     Specific Gravity, Urine 1.021  1.005 - 1.030     pH 6.0  5.0 - 8.0     Glucose, UA NEGATIVE  NEGATIVE (mg/dL)    Hgb urine dipstick NEGATIVE  NEGATIVE     Bilirubin Urine NEGATIVE  NEGATIVE     Ketones, ur 15 (*) NEGATIVE (mg/dL)    Protein, ur 30 (*) NEGATIVE (mg/dL)    Urobilinogen, UA 1.0  0.0 - 1.0 (mg/dL)    Nitrite NEGATIVE  NEGATIVE     Leukocytes, UA TRACE (*) NEGATIVE    URINE RAPID DRUG SCREEN (HOSP PERFORMED)     Status: Normal   Collection Time   02/27/11  3:07 PM      Component Value Range Comment   Opiates NONE DETECTED  NONE DETECTED     Cocaine NONE DETECTED  NONE DETECTED     Benzodiazepines NONE DETECTED  NONE DETECTED     Amphetamines NONE DETECTED  NONE DETECTED     Tetrahydrocannabinol NONE DETECTED  NONE DETECTED     Barbiturates NONE DETECTED  NONE DETECTED    URINE MICROSCOPIC-ADD ON     Status: Abnormal   Collection Time   02/27/11   3:07 PM      Component Value Range Comment   Squamous Epithelial / LPF RARE  RARE     WBC, UA 3-6  <3 (WBC/hpf)    RBC / HPF 0-2  <3 (RBC/hpf)    Bacteria, UA RARE  RARE     Casts HYALINE CASTS (*) NEGATIVE     Urine-Other MUCOUS PRESENT     GLUCOSE, CAPILLARY     Status: Abnormal   Collection Time   02/27/11 10:02 PM      Component Value Range Comment   Glucose-Capillary 100 (*) 70 - 99 (mg/dL)    Comment 1 Notify RN      Comment 2 Documented in Chart     CARDIAC PANEL(CRET KIN+CKTOT+MB+TROPI)     Status: Abnormal   Collection Time   02/28/11 12:04 AM      Component Value Range Comment   Total CK 128  7 - 232 (U/L)    CK, MB 5.0 (*) 0.3 - 4.0 (ng/mL)    Troponin I <0.30  <0.30 (ng/mL)    Relative Index 3.9 (*)  0.0 - 2.5    COMPREHENSIVE METABOLIC PANEL     Status: Abnormal   Collection Time   02/28/11  8:21 AM      Component Value Range Comment   Sodium 134 (*) 135 - 145 (mEq/L)    Potassium 3.6  3.5 - 5.1 (mEq/L)    Chloride 103  96 - 112 (mEq/L)    CO2 22  19 - 32 (mEq/L)    Glucose, Bld 90  70 - 99 (mg/dL)    BUN 14  6 - 23 (mg/dL)    Creatinine, Ser 5.62  0.50 - 1.35 (mg/dL)    Calcium 8.6  8.4 - 10.5 (mg/dL)    Total Protein 6.1  6.0 - 8.3 (g/dL)    Albumin 2.8 (*) 3.5 - 5.2 (g/dL)    AST 18  0 - 37 (U/L)    ALT 11  0 - 53 (U/L)    Alkaline Phosphatase 116  39 - 117 (U/L)    Total Bilirubin 0.4  0.3 - 1.2 (mg/dL)    GFR calc non Af Amer 81 (*) >90 (mL/min)    GFR calc Af Amer >90  >90 (mL/min)   CBC     Status: Abnormal   Collection Time   02/28/11  8:21 AM      Component Value Range Comment   WBC 7.3  4.0 - 10.5 (K/uL)    RBC 4.08 (*) 4.22 - 5.81 (MIL/uL)    Hemoglobin 10.4 (*) 13.0 - 17.0 (g/dL)    HCT 13.0 (*) 86.5 - 52.0 (%)    MCV 80.6  78.0 - 100.0 (fL)    MCH 25.5 (*) 26.0 - 34.0 (pg)    MCHC 31.6  30.0 - 36.0 (g/dL)    RDW 78.4  69.6 - 29.5 (%)    Platelets 302  150 - 400 (K/uL)   PROTIME-INR     Status: Normal   Collection Time   02/28/11  8:21 AM        Component Value Range Comment   Prothrombin Time 14.0  11.6 - 15.2 (seconds)    INR 1.06  0.00 - 1.49    APTT     Status: Normal   Collection Time   02/28/11  8:21 AM      Component Value Range Comment   aPTT 35  24 - 37 (seconds)   CARDIAC PANEL(CRET KIN+CKTOT+MB+TROPI)     Status: Abnormal   Collection Time   02/28/11  8:25 AM      Component Value Range Comment   Total CK 109  7 - 232 (U/L)    CK, MB 4.1 (*) 0.3 - 4.0 (ng/mL)    Troponin I <0.30  <0.30 (ng/mL)    Relative Index 3.8 (*) 0.0 - 2.5    GLUCOSE, CAPILLARY     Status: Normal   Collection Time   02/28/11 11:14 AM      Component Value Range Comment   Glucose-Capillary 88  70 - 99 (mg/dL)   LIPASE, BLOOD     Status: Normal   Collection Time   02/28/11 12:11 PM      Component Value Range Comment   Lipase 50  11 - 59 (U/L)     Dg Chest 2 View  02/27/2011  *RADIOLOGY REPORT*  Clinical Data: Syncope, weakness, shortness of breath, smoker  CHEST - 2 VIEW  Comparison: 02/02/2011  Findings: Normal heart size, mediastinal contours, and pulmonary vascularity. Atherosclerotic calcification aorta. Peribronchial thickening. Perihilar infiltrates slightly greater on  the right, question pulmonary edema/CHF. No pleural effusion or pneumothorax. Osseous demineralization. 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  IMPRESSION: Mild CHF.  Original Report Authenticated By: Lollie Marrow, M.D.   Ct Head Wo Contrast  02/27/2011  *RADIOLOGY REPORT*  Clinical Data: Dizziness and nausea since this morning.  Near- syncopal episode.  Possible trauma.  CT HEAD WITHOUT CONTRAST  Technique:  Contiguous axial images were obtained from the base of the skull through the vertex without contrast.  Comparison: 02/02/2011  Findings: Bone windows demonstrate clear paranasal sinuses and mastoid air cells.  Soft tissue windows demonstrate moderate low density in the periventricular white matter, likely related to small vessel disease. Expected cerebral atrophy.  No  mass lesion, hemorrhage, hydrocephalus, acute infarct, intra-axial, or extra-axial fluid collection.  IMPRESSION:  1. No acute intracranial abnormality. 2. Cerebral atrophy and small vessel ischemic change.  Original Report Authenticated By: Consuello Bossier, M.D.   US Abdomen Complete  02/28/2011  *RADIOLOGY REPORT*  Clinical Data:  Right upper quadrant pain, pancreatitis  ABDOMINAL ULTRASOUND COMPLETE  Comparison:  None.  Findings:  Gallbladder:  There are multiple gallstones within the gallbladder lumen.  The largest measures approximately 8 mm.  There is no gallbladder wall thickening or pericholecystic fluid.  Negative sonographic Murphy's sign.  Common Bile Duct:  Within normal limits in caliber. Measures 4.6 mm.  Liver: No focal mass lesion identified.  Within normal limits in parenchymal echogenicity.  IVC:  Appears normal.  Pancreas:  No abnormality identified.  Spleen:  Within normal limits in size and echotexture.  Right kidney:  Normal in size and parenchymal echogenicity.  No evidence of mass or hydronephrosis.  Left kidney:  Normal in size and parenchymal echogenicity.  No evidence of mass or hydronephrosis.  Abdominal Aorta:  No aneurysm identified.  IMPRESSION: Cholelithiasis without sonographic evidence of cholecystitis.  Original Report Authenticated By: Brandon Melnick, M.D.    Review of Systems  Constitutional: Positive for weight loss (35 lbs over last year.). Negative for fever, chills and diaphoresis.       He gets one or two meals per day from various churches and [employer].  HENT: Negative.   Eyes: Negative.   Respiratory: Positive for cough and wheezing (with bronchitis, none currently).   Cardiovascular: Negative.   Gastrointestinal: Positive for heartburn, nausea, diarrhea (befroe admission) and constipation (alternating constipation and diarrhea).  Genitourinary: Negative.   Musculoskeletal: Positive for back pain.       Back pain come and goes about 1 time  a month.  Skin: Negative.   Neurological: Positive for dizziness and weakness.       Says his legs just gave out.  Happened one other time.  Reports he passed out some time last year,but can't remember when.   Reports some trouble with memory.  Endo/Heme/Allergies: Negative.   Psychiatric/Behavioral: Positive for depression, memory loss (He's aware his memory is less than it should be.  ) and substance abuse. Negative for suicidal ideas. Hallucinations: tobacco use , Etoh use since mid 40's. The patient is nervous/anxious. The patient does not have insomnia.        + Hx. Depression No home since early 53's, lived in hotels/motels.  In a tent for the last year.   Blood pressure 159/71, pulse 87, temperature 97.9 F (36.6 C), temperature source Oral, resp. rate 20, height 5\' 7"  (1.702 m), weight 58.6 kg (129 lb 3 oz), SpO2 98.00%. Physical Exam  Constitutional: He is oriented to person, place, and  time. He appears distressed.       Thin WM NAD.  HENT:  Head: Normocephalic and atraumatic.  Eyes: Conjunctivae and EOM are normal. Pupils are equal, round, and reactive to light.  Neck: Normal range of motion. Neck supple. No JVD present. No tracheal deviation present. No thyromegaly present.  Cardiovascular: Normal rate, regular rhythm and normal heart sounds.  Exam reveals no friction rub.   No murmur heard. Respiratory: Effort normal and breath sounds normal. No respiratory distress. He has no wheezes. He has no rales. He exhibits no tenderness.  GI: Soft. Bowel sounds are normal. He exhibits no distension and no mass. There is tenderness (LLQ). There is no rebound and no guarding.       AORTIC and femoral bruits  Musculoskeletal: He exhibits edema. He exhibits no tenderness.  Lymphadenopathy:    He has no cervical adenopathy.  Neurological: He is alert and oriented to person, place, and time. He has normal reflexes. No cranial nerve deficit. Coordination normal.  Skin: Skin is warm and dry.  He is not diaphoretic.  Psychiatric: He has a normal mood and affect. His behavior is normal. Thought content normal.    Assessment/Plan: 1. Syncope 2. Abdominal pain with mild lipase elevation.   Cholelithiasis without cholecystitis. 3. History of upper GI bleed, severe candida esophagitis and duodenal bulb erosion on EGD 01/2011. 4. Ongoing alcohol use. 5. Mild congestive failure versus pneumonia. Nominal BNP elevation. 6. Ongoing tobacco use. 8. Homeless, living in a tent for the last year. 9. History depression.  Plan: He has no signs of cholecystitis, but does have cholelithiasis. He's had abdominal pain for years. His white count is normal, his lipase is normal. Currently no indication for cholecystectomy. Dr. Andrey Campanile will review and follow with you.  [facility] physician assistant for Dr. Gaynelle Adu.                                                                                Tammee Thielke 02/28/2011, 1:45 PM

## 2011-02-28 NOTE — Progress Notes (Signed)
   CARE MANAGEMENT NOTE 02/28/2011  Patient:  Dale Mcfarland, Dale Mcfarland   Account Number:  [id number]  Date Initiated:  02/28/2011  Documentation initiated by:  Letha Cape  Subjective/Objective Assessment:   dx dehydration, syncope  admit- homeless -lives in a tent.     Action/Plan:   medication ast.   Anticipated DC Date:  03/02/2011   Anticipated DC Plan:  HOME/SELF CARE      DC Planning Services  CM consult  Medication Assistance  [facility]      Choice offered to / List presented to:             Status of service:  In process, will continue to follow Medicare Important Message given?   (If response is "NO", the following Medicare IM given date fields will be blank) Date Medicare IM given:   Date Additional Medicare IM given:    Discharge Disposition:    Per UR Regulation:    Comments:  PCP [facility]- appt for 3/4 at 2:30 with Dr. Andrey Campanile  02/28/11 16:18 Letha Cape RN, BSN [phone] patient is homeless, patient does not have medicare part D for medications, patient is eligible for med ast if needed. Patient will need a bus pass for transportation, CSW referral.

## 2011-02-28 NOTE — Progress Notes (Signed)
*   Echocardiogram 2D Echocardiogram has been performed.  Jorge Retz, Real Cons 02/28/2011, 3:48 PM

## 2011-02-28 NOTE — Progress Notes (Signed)
Subjective: Denies abdominal pain.  Objective: Filed Vitals:   02/27/11 1713 02/27/11 1820 02/27/11 2100 02/28/11 0514  BP: 152/81  126/50 159/71  Pulse: 88  67 87  Temp: 98.8 F (37.1 C)  98.5 F (36.9 C) 97.9 F (36.6 C)  TempSrc: Oral  Oral Oral  Resp: 14  18 20   Height: 5\' 7"  (1.702 m)     Weight: 58.7 kg (129 lb 6.6 oz)   58.6 kg (129 lb 3 oz)  SpO2: 97% 0% 99% 98%   Weight change:   Intake/Output Summary (Last 24 hours) at 02/28/11 1118 Last data filed at 02/28/11 0834  Gross per 24 hour  Intake    510 ml  Output    250 ml  Net    260 ml    General: Alert, awake, oriented x3, in no acute distress.  HEENT: No bruits, no goiter.  Heart: Regular rate and rhythm, without murmurs, rubs, gallops.  Lungs: CTA, bilateral air movement.  Abdomen: Soft, nontender, nondistended, positive bowel sounds.  Neuro: Grossly intact, nonfocal. Extremities; no edema.   Lab Results:  [facility] 02/28/11 0821 02/27/11 1151  NA 134* 135  K 3.6 4.3  CL 103 101  CO2 22 25  GLUCOSE 90 97  BUN 14 16  CREATININE 0.85 0.82  CALCIUM 8.6 9.0  MG -- --  PHOS -- --    Basename 02/28/11 0821 02/27/11 1151  AST 18 21  ALT 11 14  ALKPHOS 116 129*  BILITOT 0.4 0.3  PROT 6.1 6.7  ALBUMIN 2.8* 3.2*    Basename 02/27/11 1151  LIPASE 99*  AMYLASE --    Basename 02/28/11 0821 02/27/11 1151  WBC 7.3 12.4*  NEUTROABS -- --  HGB 10.4* 11.3*  HCT 32.9* 35.5*  MCV 80.6 81.2  PLT 302 338    Basename 02/28/11 0825 02/28/11 0004  CKTOTAL 109 128  CKMB 4.1* 5.0*  CKMBINDEX -- --  TROPONINI <0.30 <0.30    Micro Results: No results found for this or any previous visit (from the past 240 hour(s)).  Studies/Results: Dg Chest 2 View  02/27/2011  *RADIOLOGY REPORT*  Clinical Data: Syncope, weakness, shortness of breath, smoker  CHEST - 2 VIEW  Comparison: 02/02/2011  Findings: Normal heart size, mediastinal contours, and pulmonary vascularity. Atherosclerotic calcification aorta.  Peribronchial thickening. Perihilar infiltrates slightly greater on the right, question pulmonary edema/CHF. No pleural effusion or pneumothorax. Osseous demineralization. 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  IMPRESSION: Mild CHF.  Original Report Authenticated By: Lollie Marrow, M.D.   Ct Head Wo Contrast  02/27/2011  *RADIOLOGY REPORT*  Clinical Data: Dizziness and nausea since this morning.  Near- syncopal episode.  Possible trauma.  CT HEAD WITHOUT CONTRAST  Technique:  Contiguous axial images were obtained from the base of the skull through the vertex without contrast.  Comparison: 02/02/2011  Findings: Bone windows demonstrate clear paranasal sinuses and mastoid air cells.  Soft tissue windows demonstrate moderate low density in the periventricular white matter, likely related to small vessel disease. Expected cerebral atrophy. No  mass lesion, hemorrhage, hydrocephalus, acute infarct, intra-axial, or extra-axial fluid collection.  IMPRESSION:  1. No acute intracranial abnormality. 2. Cerebral atrophy and small vessel ischemic change.  Original Report Authenticated By: Consuello Bossier, M.D.   US Abdomen Complete  02/28/2011  *RADIOLOGY REPORT*  Clinical Data:  Right upper quadrant pain, pancreatitis  ABDOMINAL ULTRASOUND COMPLETE  Comparison:  None.  Findings:  Gallbladder:  There are multiple gallstones within the gallbladder lumen.  The largest measures approximately 8 mm.  There is no gallbladder wall thickening or pericholecystic fluid.  Negative sonographic Murphy's sign.  Common Bile Duct:  Within normal limits in caliber. Measures 4.6 mm.  Liver: No focal mass lesion identified.  Within normal limits in parenchymal echogenicity.  IVC:  Appears normal.  Pancreas:  No abnormality identified.  Spleen:  Within normal limits in size and echotexture.  Right kidney:  Normal in size and parenchymal echogenicity.  No evidence of mass or hydronephrosis.  Left kidney:  Normal in size and parenchymal  echogenicity.  No evidence of mass or hydronephrosis.  Abdominal Aorta:  No aneurysm identified.  IMPRESSION: Cholelithiasis without sonographic evidence of cholecystitis.  Original Report Authenticated By: Brandon Melnick, M.D.    Medications: I have reviewed the patient's current medications. Pancreatitis:  Patient presents with abdominal pain, has mild increase lipase. [place] order due to mild increase alk phosphatase. US show cholelithiasis. Patient has history of alcohol use. I will consult Surgery for further recommendation.  Lipase result pending. Start clear diet.  Alcoholism (02/02/2011) No history of DT, Last alcohol drink last Saturday. Continue with  thiamine and folate.  Dehydration (02/27/2011) Continue with gentle hydration.   Syncope (02/27/2011)  Probably 2 to decrease volume, vs vaso vagal. I will check orthostatic. Continue with IV fluids. monitor for arrythmia. I will check ECHO. Neuro exam non focal. CT head negative.  Chest x ray: mild CHF, patient denies dyspnea. BNP lower than before. Monitor Sat.       LOS: 1 day   REGALADO,BELKYS M.D.  [facility] 02/28/2011, 11:18 AM

## 2011-03-01 ENCOUNTER — Encounter (HOSPITAL_COMMUNITY): Payer: Self-pay | Admitting: Physician Assistant

## 2011-03-01 DIAGNOSIS — R0989 Other specified symptoms and signs involving the circulatory and respiratory systems: Secondary | ICD-10-CM

## 2011-03-01 DIAGNOSIS — R55 Syncope and collapse: Secondary | ICD-10-CM

## 2011-03-01 LAB — GLUCOSE, CAPILLARY: Glucose-Capillary: 93 mg/dL (ref 70–99)

## 2011-03-01 MED ORDER — LISINOPRIL 5 MG PO TABS
5.0000 mg | ORAL_TABLET | Freq: Every day | ORAL | Status: DC
  Administered 2011-03-01 – 2011-03-05 (×5): 5 mg via ORAL
  Filled 2011-03-01 (×5): qty 1

## 2011-03-01 MED ORDER — REGADENOSON 0.4 MG/5ML IV SOLN
0.4000 mg | Freq: Once | INTRAVENOUS | Status: AC
  Administered 2011-03-02: 0.4 mg via INTRAVENOUS
  Filled 2011-03-01: qty 5

## 2011-03-01 MED ORDER — SODIUM CHLORIDE 0.9 % IJ SOLN
3.0000 mL | Freq: Two times a day (BID) | INTRAMUSCULAR | Status: DC
  Administered 2011-03-02 – 2011-03-05 (×5): 3 mL via INTRAVENOUS

## 2011-03-01 MED ORDER — CARVEDILOL 3.125 MG PO TABS
3.1250 mg | ORAL_TABLET | Freq: Two times a day (BID) | ORAL | Status: DC
  Administered 2011-03-01 – 2011-03-02 (×2): 3.125 mg via ORAL
  Filled 2011-03-01 (×4): qty 1

## 2011-03-01 NOTE — Progress Notes (Signed)
*  PRELIMINARY RESULTS*  Carotid Doppler has been performed. Right = No significant ICA stenosis with antegrade vertebral flow.  Left = 60-79%, upper end of scale, ICA stenosis with antegrade vertebral flow; ECA stenosis noted as well.  Farrel Demark RDMS 03/01/2011, 2:44 PM

## 2011-03-01 NOTE — Consult Note (Signed)
CARDIOLOGY CONSULT NOTE  Patient ID: Dale Mcfarland, MRN: [id number], DOB/AGE: 1930-01-27 78 y.o. Admit date: 02/27/2011 Date of Consult: 03/01/2011  Primary Physician: No primary provider on file. Primary Cardiologist: New to [facility]  Chief Complaint: passed out Reason for Consultation: decreased EF of 30-35% and syncope  HPI: 78 y/o M with no prior cardiac history but recent hospitalization for PNA, UGI bleed and anemia 2/2 candida esophagitis and duodenal bulb erosions, HTN, EtOH abuse presented to [facility] with complaint of passing out. He was waiting in line to get food at [facility] when he developed dyspnea, dizziness, and passed out. He thinks he was out anywhere from a second to two minutes & next remembers waking up in the ambulance still somewhat confused. He denies any chest pain & palpitations. He reports a similar episode last year. CT was negative for acute intracranial abnormality.  It is unclear if orthostatics were documented, but he was noted to have varying pressures in the ER on admission from 162/70 to 93/63 two hours apart. His syncope has been felt orthostatic vs vasovagal thus far by the admitting team.  As part of his syncope evaluation, 2D echo was obtained showing 30-35%. We are asked to see him regarding further recommendations. Telemetry has shown NSR with PVCs and PACs brief 12beat SVT thus far. From a cardiac standpoint, he has not had any chest pain. He does however endorse a recent decline in exercise tolerance over the last 3 weeks. He walks 5 miles/day regularly but recently has noticed he has to stop and rest because of dyspnea. No orthopnea, PND, or LEE. CE's have shown mildly elevated MB up to 5.0 but negative troponin x 3. EKG is nonacute. Of note, CXR 01/2011 suggested mild CHF and cardiomegaly.  He also complained of abdominal pain this admission with mildly elevated lipase to 99. Abd US showed cholelithasis but no cholecystitis.  Surgery has seen him and believes elevation is r/t EtOH and do not feel he needs surgery at present. Lipase has returned to normal. Of note, FOBT was positive on admission and he remains mildly anemic. He denies any melena, hematemesis, or BRBPR since December.   Past Medical History  Diagnosis Date  . COPD (chronic obstructive pulmonary disease)   . Chronic back pain   . Pneumonia     01/2011 - CAP vs aspiration pneuonmia  . Depression   . PONV (postoperative nausea and vomiting)   . Hyponatremia     Previously felt secondary to SIADH  . Hypertension   . Upper GI bleed     01/2011 with EGD showing severe candida esophagitis and duodenal bulb erosion  . Anemia     In the setting of UGI bleed 01/2011 requiring blood transfusion  . Homelessness       Surgical History:  Past Surgical History  Procedure Date  . Tonsillectomy   . Vasectomy   . Appendectomy   . Tonsillectomy   . Colonoscopy   . Esophagogastroduodenoscopy   . Esophagogastroduodenoscopy 02/03/2011    Procedure: ESOPHAGOGASTRODUODENOSCOPY (EGD);  Surgeon: Charna Elizabeth, MD;  Location: [facility];  Service: Endoscopy;  Laterality: N/A;  . Esophagogastroduodenoscopy 02/03/2011    Procedure: ESOPHAGOGASTRODUODENOSCOPY (EGD);  Surgeon: Charna Elizabeth, MD;  Location: [facility];  Service: Endoscopy;  Laterality: N/A;     Home Meds: None. But states he would be willing to "if they were necessary."   Inpatient Medications:     . folic acid  1 mg Intravenous Daily  .  lisinopril  5 mg Oral Daily  . thiamine  100 mg Intravenous Daily  . DISCONTD: pantoprazole (PROTONIX) IV  40 mg Intravenous Q12H    Allergies:  Allergies  Allergen Reactions  . Penicillins Itching    History   Social History  . Marital Status: Divorced    Spouse Name: N/A    Number of Children: N/A  . Years of Education: N/A   Occupational History  . Not on file.   Social History Main Topics  . Smoking status: Current Some Day Smoker --  1.0 packs/day for 60 years    Types: Cigarettes  . Smokeless tobacco: Never Used   Comment: Since age 78  . Alcohol Use: Yes     174 ml of scotch weekly  . Drug Use: No     Denies hx of such as well  . Sexually Active: Not Currently   Other Topics Concern  . Not on file   Social History Narrative   Lives in a "tramp [place]" with various other people 68-66 years of age including ex-cons. Has a graduate degree in Physical Science. 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 Stopped working to help his sick father after his MI.    Family History  Problem Relation Age of Onset  . Coronary artery disease Father     Starting in his 40's. Died of massive MI at age 65.  . Schizophrenia Brother   . Coronary artery disease Sister     MI at age 93  . Alzheimer's disease Mother    Review of Systems: General: negative for chills, fever Cardiovascular: see above Dermatological: negative for rash Respiratory: +cough Abdominal: negative for nausea, vomiting, diarrhea, bright red blood per rectum, melena, or hematemesis Neurologic: negative for visual changes. See above All other systems reviewed and are otherwise negative except as noted above.  Labs:  [facility] 02/28/11 1630 02/28/11 0825 02/28/11 0004  CKTOTAL 97 109 128  CKMB 3.5 4.1* 5.0*  TROPONINI <0.30 <0.30 <0.30   Lab Results  Component Value Date   WBC 7.3 02/28/2011   HGB 10.4* 02/28/2011   HCT 32.9* 02/28/2011   MCV 80.6 02/28/2011   PLT 302 02/28/2011     Lab 02/28/11 0821  NA 134*  K 3.6  CL 103  CO2 22  BUN 14  CREATININE 0.85  CALCIUM 8.6  PROT 6.1  BILITOT 0.4  ALKPHOS 116  ALT 11  AST 18  GLUCOSE 90   Radiology/Studies:  1. Chest 2 View 02/27/2011  *RADIOLOGY REPORT*  Clinical Data: Syncope, weakness, shortness of breath, smoker  CHEST - 2 VIEW  Comparison: 02/02/2011  Findings: Normal heart size, mediastinal contours, and pulmonary vascularity. Atherosclerotic  calcification aorta. Peribronchial thickening. Perihilar infiltrates slightly greater on the right, question pulmonary edema/CHF. No pleural effusion or pneumothorax. Osseous demineralization. 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  IMPRESSION: Mild CHF.  Original Report Authenticated By: Lollie Marrow, M.D.   2. Ct Head Wo Contrast 02/27/2011  *RADIOLOGY REPORT*  Clinical Data: Dizziness and nausea since this morning.  Near- syncopal episode.  Possible trauma.  CT HEAD WITHOUT CONTRAST  Technique:  Contiguous axial images were obtained from the base of the skull through the vertex without contrast.  Comparison: 02/02/2011  Findings: Bone windows demonstrate clear paranasal sinuses and mastoid air cells.  Soft tissue windows demonstrate moderate low density in the periventricular white matter, likely related to small vessel disease. Expected cerebral atrophy. No  mass lesion, hemorrhage,  hydrocephalus, acute infarct, intra-axial, or extra-axial fluid collection.  IMPRESSION:  1. No acute intracranial abnormality. 2. Cerebral atrophy and small vessel ischemic change.  Original Report Authenticated By: Consuello Bossier, M.D.   3. US Abdomen Complete 02/28/2011  *RADIOLOGY REPORT*  Clinical Data:  Right upper quadrant pain, pancreatitis  ABDOMINAL ULTRASOUND COMPLETE  Comparison:  None.  Findings:  Gallbladder:  There are multiple gallstones within the gallbladder lumen.  The largest measures approximately 8 mm.  There is no gallbladder wall thickening or pericholecystic fluid.  Negative sonographic Murphy's sign.  Common Bile Duct:  Within normal limits in caliber. Measures 4.6 mm.  Liver: No focal mass lesion identified.  Within normal limits in parenchymal echogenicity.  IVC:  Appears normal.  Pancreas:  No abnormality identified.  Spleen:  Within normal limits in size and echotexture.  Right kidney:  Normal in size and parenchymal echogenicity.  No evidence of mass or hydronephrosis.  Left kidney:  Normal in  size and parenchymal echogenicity.  No evidence of mass or hydronephrosis.  Abdominal Aorta:  No aneurysm identified.  IMPRESSION: Cholelithiasis without sonographic evidence of cholecystitis.  Original Report Authenticated By: Brandon Melnick, M.D.   4. 2D echo 02/28/11 - Study Conclusions - Left ventricle: The cavity size was normal. There was moderate concentric hypertrophy. Systolic function was moderately to severely reduced. The estimated ejection fraction was in the range of 30% to 35%. Diffuse hypokinesis. - Aortic root: The aortic root was mildly dilated. - Left atrium: The atrium was moderately dilated.  EKG: NSR 82bpm PV noted. Question of septal infarct (Q's V1-V2). No acute changes.  Physical Exam: Blood pressure 132/79, pulse 82, temperature 98.6 F (37 C), temperature source Oral, resp. rate 18, height 5\' 7"  (1.702 m), weight 129 lb 13.6 oz (58.9 kg), SpO2 99.00%. General: Well developed pleasant thin disshelvered appearing M in no acute distress. Malodorous. Laying flat ~15 degrees in bed. Nontoxic appearing. Head: Normocephalic, atraumatic, sclera non-icteric, no xanthomas, nares are without discharge.  Neck: JVD not elevated. +Left carotid bruit Lungs: Clear bilaterally to auscultation without wheezes, rales, or rhonchi. Breathing is unlabored. Heart: RRR with S1 S2. No murmurs, rubs, or gallops appreciated. Abdomen: Soft, non-tender, non-distended with normoactive bowel sounds. No hepatomegaly. No rebound/guarding. No obvious abdominal masses. Msk:  Strength and tone appear normal for age. Extremities: No clubbing or cyanosis. No edema. Distal pedal pulses are 2+ and equal bilaterally. Neuro: Alert and oriented X 3. Moves all extremities spontaneously. Psych:  Responds to questions appropriately with a normal affect.   Assessment and Plan:   1. Syncope 2. Newly identified cardiomyopathy with EF 30-35% 3. Difficult social situation with EtOH abuse, ongoing tobacco abuse, &  homelessness 4. Recent GIB with FOBT+ stool on admission and residual mild anemia 5. Left carotid bruit  The etiology of his syncope is not entirely clear. Some features allude to orthostasis as evidenced by blood pressure differential, but it is still worrisome with his low EF. With cardiomegaly identified on 01/2011 CXR I suspect he has had cardiomyopathy for some time. He does not appear acutely decompensated on exam. This may be related to his alcohol use, but he certainly has multiple identifiable cardiac risk factors including age, ongoing tobacco use, and strong family history of such. His recent increase in DOE is also concerning. Unfortunately with recent GI bleed and +FOBT here as well as difficult social situation, I am not sure he would be an ideal candidate for invasive cardiac approach. Agree with initiation of  ACEI. Consider addition of BB, with careful monitoring of blood pressure. Will discuss further management with Dr. Excell Seltzer.  Signed, Dayna Dunn PA-C 03/01/2011, 1:00 PM   Patient seen, examined. Available data reviewed. Agree with findings, assessment, and plan as outlined by Ronie Spies, PA-C.  The patient was independently interviewed and examined and I agree with documentation as outlined. The patient has severe LV dysfunction and had a syncopal episode, with differential dx as above. His social situation is complicated as he lives in a 'tent with several ex-cons.' He drinks alcohol heavily as well. I think the most prudent course is to evaluate him with a carotid duplex as he has a loud left bruit, inpatient Myoview to rule out ischemic heart disease, and institute medical therapy for cardiomyopathy if he will take it. We discussed the importance of alcohol cessation as it is the likely cause of his cardiomyopathy. I don't think he is a candidate for invasive EP eval or ICD unless we document ventricular arrhythmia on telemetry. Will start low-dose coreg.  Tonny Bollman, M.D.

## 2011-03-01 NOTE — Consult Note (Signed)
Pt denies epigastric/ruq pain; has mild intermittent LLQ pain that lasts for a few seconds.   abd soft, nt, nd O/w as above  While pt has gallstones, his LLQ pain is not c/w symptomatic cholelithiasis. No clinical or radiological evidence of cholecystitis.   No indication for cholecystectomy in asymptomatic cholelithiasis.  I saw the patient, participated in the history, exam and medical decision making, and concur with the physician assistant's note above.  Mary Sella. Andrey Campanile, MD, FACS General, Bariatric, & Minimally Invasive Surgery [facility], [place]

## 2011-03-01 NOTE — Progress Notes (Signed)
Subjective: Pt feels well. Ate breakfast this am and denies pain or nausea. Echo yesterday shows EF 30%  Objective: Vital signs in last 24 hours: Temp:  [97.2 F (36.2 C)-98.4 F (36.9 C)] 97.2 F (36.2 C) (01/18 0448) Pulse Rate:  [68-85] 68  (01/18 0448) Resp:  [18-20] 18  (01/18 0448) BP: (135-145)/(69-77) 144/77 mmHg (01/18 0448) SpO2:  [94 %-98 %] 96 % (01/18 0448) Weight:  [58.9 kg (129 lb 13.6 oz)] 58.9 kg (129 lb 13.6 oz) (01/18 0448) Last BM Date: 02/28/11  Intake/Output this shift: Total I/O In: 240 [P.O.:240] Out: -   Physical Exam: BP 144/77  Pulse 68  Temp(Src) 97.2 F (36.2 C) (Oral)  Resp 18  Ht 5\' 7"  (1.702 m)  Wt 58.9 kg (129 lb 13.6 oz)  BMI 20.34 kg/m2  SpO2 96% Abdomen: soft, ND, NT, no masses  Labs: CBC  Basename 02/28/11 0821 02/27/11 1151  WBC 7.3 12.4*  HGB 10.4* 11.3*  HCT 32.9* 35.5*  PLT 302 338   BMET  Basename 02/28/11 0821 02/27/11 1151  NA 134* 135  K 3.6 4.3  CL 103 101  CO2 22 25  GLUCOSE 90 97  BUN 14 16  CREATININE 0.85 0.82  CALCIUM 8.6 9.0   LFT  Basename 02/28/11 1211 02/28/11 0821  PROT -- 6.1  ALBUMIN -- 2.8*  AST -- 18  ALT -- 11  ALKPHOS -- 116  BILITOT -- 0.4  BILIDIR -- --  IBILI -- --  LIPASE 50 --   PT/INR  Basename 02/28/11 0821 02/27/11 1308  LABPROT 14.0 13.8  INR 1.06 1.04   ABG No results found for this basename: PHART:2,PCO2:2,PO2:2,HCO3:2 in the last 72 hours  Studies/Results: Dg Chest 2 View  02/27/2011  *RADIOLOGY REPORT*  Clinical Data: Syncope, weakness, shortness of breath, smoker  CHEST - 2 VIEW  Comparison: 02/02/2011  Findings: Normal heart size, mediastinal contours, and pulmonary vascularity. Atherosclerotic calcification aorta. Peribronchial thickening. Perihilar infiltrates slightly greater on the right, question pulmonary edema/CHF. No pleural effusion or pneumothorax. Osseous demineralization. 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  IMPRESSION: Mild CHF.  Original  Report Authenticated By: Lollie Marrow, M.D.   Ct Head Wo Contrast  02/27/2011  *RADIOLOGY REPORT*  Clinical Data: Dizziness and nausea since this morning.  Near- syncopal episode.  Possible trauma.  CT HEAD WITHOUT CONTRAST  Technique:  Contiguous axial images were obtained from the base of the skull through the vertex without contrast.  Comparison: 02/02/2011  Findings: Bone windows demonstrate clear paranasal sinuses and mastoid air cells.  Soft tissue windows demonstrate moderate low density in the periventricular white matter, likely related to small vessel disease. Expected cerebral atrophy. No  mass lesion, hemorrhage, hydrocephalus, acute infarct, intra-axial, or extra-axial fluid collection.  IMPRESSION:  1. No acute intracranial abnormality. 2. Cerebral atrophy and small vessel ischemic change.  Original Report Authenticated By: Consuello Bossier, M.D.   US Abdomen Complete  02/28/2011  *RADIOLOGY REPORT*  Clinical Data:  Right upper quadrant pain, pancreatitis  ABDOMINAL ULTRASOUND COMPLETE  Comparison:  None.  Findings:  Gallbladder:  There are multiple gallstones within the gallbladder lumen.  The largest measures approximately 8 mm.  There is no gallbladder wall thickening or pericholecystic fluid.  Negative sonographic Murphy's sign.  Common Bile Duct:  Within normal limits in caliber. Measures 4.6 mm.  Liver: No focal mass lesion identified.  Within normal limits in parenchymal echogenicity.  IVC:  Appears normal.  Pancreas:  No abnormality identified.  Spleen:  Within normal limits in size and echotexture.  Right kidney:  Normal in size and parenchymal echogenicity.  No evidence of mass or hydronephrosis.  Left kidney:  Normal in size and parenchymal echogenicity.  No evidence of mass or hydronephrosis.  Abdominal Aorta:  No aneurysm identified.  IMPRESSION: Cholelithiasis without sonographic evidence of cholecystitis.  Original Report Authenticated By: Brandon Melnick, M.D.     Assessment: Active Problems:  Alcoholism  Dehydration  Syncope Cholelithiasis without cholecystitis. Suspect this episode of his LFTs/Lipase elevating was from EtOH No immediate surgical indication. Call if needed.   LOS: 2 days    Marianna Fuss 03/01/2011

## 2011-03-01 NOTE — Clinical Documentation Improvement (Signed)
CHF DOCUMENTATION CLARIFICATION QUERY  THIS DOCUMENT IS NOT A PERMANENT PART OF THE MEDICAL RECORD  Please update your documentation within the medical record to reflect your response to this query.                                                                                     03/01/11  Dr Sunnie Nielsen,  In a better effort to capture your patient's severity of illness, reflect appropriate length of stay and utilization of resources, a review of the patient medical record has revealed the following indicators regarding the diagnosis of Heart Failure.    Based on your clinical judgment, please document the ACUITY and TYPE of CHF in the progress notes and discharge summary:  ACUITY                                          AND                               TYPE  - Acute                                                                                  - Systolic  - Chronic                                                                              - Diastolic  - Acute on Chronic                                                             - Combined     Clinical Information:   "Chest x ray: mild CHF, patient denies dyspnea. BNP lower than before. Monitor Sat." "Cardiomyopathy Ef 30 %: New diagnosis, no prior ECHO on records. Possible related to alcohol but Ischemic is in the differential. I will consult Cardiology for further recommendation. I will start lisinopril. Denies chest pain on exertion , does relates dyspnea on exertion." Progress Notes signed by Hartley Barefoot, MD at 03/01/11 1124   Echo 02/28/11 Left ventricle: The cavity size was normal. There was moderate concentric hypertrophy. Systolic function was moderately to severely reduced. The estimated ejection fraction was in the range of 30%  to 35%. Diffuse hypokinesis.  In responding to this query please exercise your independent judgment.  The fact that a query is asked, does not imply that any particular answer is desired  or expected.  Reviewed: Query was never documented in discharge summary or progress notes.  Mathis Dad RN 03/12/11  Thank You,  Jerral Ralph  RN BSN Certified Clinical Documentation Specialist: Cell   [phone]  Health Information Management [place]  TO RESPOND TO THE THIS QUERY, FOLLOW THE INSTRUCTIONS BELOW:  If needed, update documentation for the patient's encounter via the notes activity.  Access this query again and click edit on the In [employer].  After updating, or not, click F2 to complete all highlighted (required) fields concerning your review. Select "additional documentation in the medical record" OR "no additional documentation provided".  Click Sign note button.  The deficiency will fall out of your In Basket *Please let us know if you are not able to complete this workflow by phone or e-mail (listed below).

## 2011-03-01 NOTE — Progress Notes (Signed)
Subjective: Feeling well. No abdominal pain. No dyspnea or chest pain.  Objective: Filed Vitals:   02/28/11 0514 02/28/11 1404 02/28/11 2149 03/01/11 0448  BP: 159/71 135/74 145/69 144/77  Pulse: 87 76 85 68  Temp: 97.9 F (36.6 C) 98.4 F (36.9 C) 97.2 F (36.2 C) 97.2 F (36.2 C)  TempSrc: Oral Oral    Resp: 20 20 20 18   Height:      Weight: 58.6 kg (129 lb 3 oz)   58.9 kg (129 lb 13.6 oz)  SpO2: 98% 98% 94% 96%   Weight change: 0.2 kg (7.1 oz)  Intake/Output Summary (Last 24 hours) at 03/01/11 1100 Last data filed at 03/01/11 0859  Gross per 24 hour  Intake   1080 ml  Output      0 ml  Net   1080 ml    General: Alert, awake, oriented x3, in no acute distress.  HEENT: No bruits, no goiter.  JVD.  Heart: Regular rate and rhythm, without murmurs, rubs, gallops.  Lungs: Crackles bl, bilateral air movement.  Abdomen: Soft, nontender, nondistended, positive bowel sounds.  Neuro: Grossly intact, nonfocal. Extremities; no edema.   Lab Results:  [facility] 02/28/11 0821 02/27/11 1151  NA 134* 135  K 3.6 4.3  CL 103 101  CO2 22 25  GLUCOSE 90 97  BUN 14 16  CREATININE 0.85 0.82  CALCIUM 8.6 9.0  MG -- --  PHOS -- --    Basename 02/28/11 0821 02/27/11 1151  AST 18 21  ALT 11 14  ALKPHOS 116 129*  BILITOT 0.4 0.3  PROT 6.1 6.7  ALBUMIN 2.8* 3.2*    Basename 02/28/11 1211 02/27/11 1151  LIPASE 50 99*  AMYLASE -- --    Basename 02/28/11 0821 02/27/11 1151  WBC 7.3 12.4*  NEUTROABS -- --  HGB 10.4* 11.3*  HCT 32.9* 35.5*  MCV 80.6 81.2  PLT 302 338    Basename 02/28/11 1630 02/28/11 0825 02/28/11 0004  CKTOTAL 97 109 128  CKMB 3.5 4.1* 5.0*  CKMBINDEX -- -- --  TROPONINI <0.30 <0.30 <0.30    Studies/Results: Dg Chest 2 View  02/27/2011  *RADIOLOGY REPORT*  Clinical Data: Syncope, weakness, shortness of breath, smoker  CHEST - 2 VIEW  Comparison: 02/02/2011  Findings: Normal heart size, mediastinal contours, and pulmonary vascularity.  Atherosclerotic calcification aorta. Peribronchial thickening. Perihilar infiltrates slightly greater on the right, question pulmonary edema/CHF. No pleural effusion or pneumothorax. Osseous demineralization. 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  IMPRESSION: Mild CHF.  Original Report Authenticated By: Lollie Marrow, M.D.   Ct Head Wo Contrast  02/27/2011  *RADIOLOGY REPORT*  Clinical Data: Dizziness and nausea since this morning.  Near- syncopal episode.  Possible trauma.  CT HEAD WITHOUT CONTRAST  Technique:  Contiguous axial images were obtained from the base of the skull through the vertex without contrast.  Comparison: 02/02/2011  Findings: Bone windows demonstrate clear paranasal sinuses and mastoid air cells.  Soft tissue windows demonstrate moderate low density in the periventricular white matter, likely related to small vessel disease. Expected cerebral atrophy. No  mass lesion, hemorrhage, hydrocephalus, acute infarct, intra-axial, or extra-axial fluid collection.  IMPRESSION:  1. No acute intracranial abnormality. 2. Cerebral atrophy and small vessel ischemic change.  Original Report Authenticated By: Consuello Bossier, M.D.   US Abdomen Complete  02/28/2011  *RADIOLOGY REPORT*  Clinical Data:  Right upper quadrant pain, pancreatitis  ABDOMINAL ULTRASOUND COMPLETE  Comparison:  None.  Findings:  Gallbladder:  There are multiple  gallstones within the gallbladder lumen.  The largest measures approximately 8 mm.  There is no gallbladder wall thickening or pericholecystic fluid.  Negative sonographic Murphy's sign.  Common Bile Duct:  Within normal limits in caliber. Measures 4.6 mm.  Liver: No focal mass lesion identified.  Within normal limits in parenchymal echogenicity.  IVC:  Appears normal.  Pancreas:  No abnormality identified.  Spleen:  Within normal limits in size and echotexture.  Right kidney:  Normal in size and parenchymal echogenicity.  No evidence of mass or hydronephrosis.  Left  kidney:  Normal in size and parenchymal echogenicity.  No evidence of mass or hydronephrosis.  Abdominal Aorta:  No aneurysm identified.  IMPRESSION: Cholelithiasis without sonographic evidence of cholecystitis.  Original Report Authenticated By: Brandon Melnick, M.D.    Medications: I have reviewed the patient's current medications.   Patient Active Hospital Problem List:  Pancreatitis:  Patient presents with abdominal pain, has mild increase lipase. [place] order due to mild increase alk phosphatase. US show cholelithiasis.  Patient has history of alcohol use. Appreciate surgery recommendation. No surgery at this time. Pancreatitis probably from alcohol. Lipase result pending. Start clear diet.   Alcoholism (02/02/2011) No history of DT, Last alcohol drink last Saturday. Continue with thiamine and folate.   Dehydration (02/27/2011) Resolved. NSL.   Syncope (02/27/2011)  Probably 2 to decrease volume. Will reorder Orthostatic vitals.  Monitor for arrythmia.  ECHO showed Cardiomyopathy Ef 30 %. Neuro exam non focal. CT head negative.   Chest x ray: mild CHF, patient denies dyspnea. BNP lower than before. Monitor Sat.   Cardiomyopathy Ef 30 %: New diagnosis, no prior ECHO on records. Possible related to alcohol but Ischemic is in the differential. I will consult Cardiology for further recommendation. I will start lisinopril. Denies chest pain on exertion , does relates dyspnea on exertion.         LOS: 2 days   Borna Wessinger M.D.  [facility] 03/01/2011, 11:00 AM

## 2011-03-01 NOTE — Progress Notes (Signed)
Pt has been transported for test, patient is stable_______________________________________D. Manson Passey RN

## 2011-03-01 NOTE — Progress Notes (Signed)
abd soft, nt, nd.  Asymptomatic cholelithiasis No role for cholecystectomy Suspect small bump in LFTs was due to etoh.  Signing off  Mary Sella. Andrey Campanile, MD, FACS General, Bariatric, & Minimally Invasive Surgery [facility], [place]

## 2011-03-02 ENCOUNTER — Inpatient Hospital Stay (HOSPITAL_COMMUNITY): Payer: Medicare Other

## 2011-03-02 DIAGNOSIS — R5381 Other malaise: Secondary | ICD-10-CM

## 2011-03-02 DIAGNOSIS — I509 Heart failure, unspecified: Principal | ICD-10-CM

## 2011-03-02 DIAGNOSIS — R079 Chest pain, unspecified: Secondary | ICD-10-CM

## 2011-03-02 LAB — IRON AND TIBC: Iron: 20 ug/dL — ABNORMAL LOW (ref 42–135)

## 2011-03-02 LAB — FOLATE: Folate: 19.5 ng/mL

## 2011-03-02 LAB — GLUCOSE, CAPILLARY
Glucose-Capillary: 110 mg/dL — ABNORMAL HIGH (ref 70–99)
Glucose-Capillary: 110 mg/dL — ABNORMAL HIGH (ref 70–99)

## 2011-03-02 LAB — CBC
Hemoglobin: 10 g/dL — ABNORMAL LOW (ref 13.0–17.0)
MCH: 26 pg (ref 26.0–34.0)
MCV: 80.8 fL (ref 78.0–100.0)
RBC: 3.85 MIL/uL — ABNORMAL LOW (ref 4.22–5.81)

## 2011-03-02 LAB — BASIC METABOLIC PANEL
BUN: 15 mg/dL (ref 6–23)
CO2: 23 mEq/L (ref 19–32)
Calcium: 8.8 mg/dL (ref 8.4–10.5)
Creatinine, Ser: 0.86 mg/dL (ref 0.50–1.35)
Glucose, Bld: 88 mg/dL (ref 70–99)

## 2011-03-02 LAB — FERRITIN: Ferritin: 21 ng/mL — ABNORMAL LOW (ref 22–322)

## 2011-03-02 LAB — RETICULOCYTES: Retic Count, Absolute: 23.6 10*3/uL (ref 19.0–186.0)

## 2011-03-02 MED ORDER — TECHNETIUM TC 99M TETROFOSMIN IV KIT
10.0000 | PACK | Freq: Once | INTRAVENOUS | Status: AC | PRN
  Administered 2011-03-02: 10 via INTRAVENOUS

## 2011-03-02 MED ORDER — METOPROLOL SUCCINATE 12.5 MG HALF TABLET
12.5000 mg | ORAL_TABLET | Freq: Every day | ORAL | Status: DC
  Filled 2011-03-02: qty 1

## 2011-03-02 MED ORDER — TECHNETIUM TC 99M TETROFOSMIN IV KIT
30.0000 | PACK | Freq: Once | INTRAVENOUS | Status: AC | PRN
  Administered 2011-03-02: 30 via INTRAVENOUS

## 2011-03-02 MED ORDER — METOPROLOL SUCCINATE 12.5 MG HALF TABLET: 12.5000 mg | ORAL_TABLET | Freq: Every day | ORAL | Status: DC

## 2011-03-02 NOTE — Progress Notes (Signed)
Clinical Social Worker completed the psychosocial assessment, which can be found in the shadow chart. Patient not agreeable to a SNF or going to a shelter at discharge. CSW will continue to monitor patient progress and follow-up with patient regarding an appropriate discharge plan.  Genelle Bal, MSW, LCSW [phone]

## 2011-03-02 NOTE — Progress Notes (Signed)
Cardiac stress test with Lexiscan completed. Complained of mild shortness of breath and nausea.  Shortness of breat resolved without complications.  Nausea easing at this time.  Marianne Sofia, PA present at start and throughout exam.  No adverse complications noted

## 2011-03-02 NOTE — Progress Notes (Signed)
Subjective: feeling sleepy today, still coughing some. No abdominal pain, had 1 bm loose.  Objective: Filed Vitals:   03/02/11 1140 03/02/11 1142 03/02/11 1258 03/02/11 1259  BP: 103/52 101/58 101/58 101/58  Pulse: 89 87 87   Temp:      TempSrc:      Resp: 20 21    Height:      Weight:      SpO2:       Weight change: 2.335 kg (5 lb 2.4 oz)  Intake/Output Summary (Last 24 hours) at 03/02/11 1321 Last data filed at 03/02/11 1300  Gross per 24 hour  Intake    363 ml  Output    575 ml  Net   -212 ml    General: Alert, awake, oriented x3, in no acute distress.  HEENT: No bruits, no goiter.  Heart: Regular rate and rhythm, without murmurs, rubs, gallops.  Lungs: CTA, bilateral air movement.  Abdomen: Soft, nontender, nondistended, positive bowel sounds.  Neuro: Grossly intact, nonfocal. Extremities;no edema.   Lab Results:  Basename 03/02/11 0600 02/28/11 0821  NA 133* 134*  K 4.0 3.6  CL 101 103  CO2 23 22  GLUCOSE 88 90  BUN 15 14  CREATININE 0.86 0.85  CALCIUM 8.8 8.6  MG -- --  PHOS -- --    Basename 02/28/11 0821  AST 18  ALT 11  ALKPHOS 116  BILITOT 0.4  PROT 6.1  ALBUMIN 2.8*    Basename 02/28/11 1211  LIPASE 50  AMYLASE --    Basename 03/02/11 0600 02/28/11 0821  WBC 7.1 7.3  NEUTROABS -- --  HGB 10.0* 10.4*  HCT 31.1* 32.9*  MCV 80.8 80.6  PLT 276 302    Basename 02/28/11 1630 02/28/11 0825 02/28/11 0004  CKTOTAL 97 109 128  CKMB 3.5 4.1* 5.0*  CKMBINDEX -- -- --  TROPONINI <0.30 <0.30 <0.30   Micro Results: No results found for this or any previous visit (from the past 240 hour(s)).  Studies/Results: No results found.  Medications: I have reviewed the patient's current medications.  Patient Active Hospital Problem List:   Pancreatitis:  Patient presents with abdominal pain, has mild increase lipase. [place] order due to mild increase alk phosphatase. US show cholelithiasis.  Patient has history of alcohol use. Appreciate surgery  recommendation. No surgery at this time. Pancreatitis probably from alcohol.  Resolved , tolerating diet.   Alcoholism (02/02/2011) No history of DT, Last alcohol drink last Saturday. Continue with thiamine and folate.   Dehydration (02/27/2011) Resolved. NSL.   Syncope (02/27/2011)  Probably 2 to decrease volume.  Monitor for arrythmia. ECHO showed Cardiomyopathy Ef 30 %. Neuro exam non focal. CT head negative.   Cardiomyopathy Ef 30 %: New diagnosis, no prior ECHO on records. Possible related to alcohol but Ischemic is in the differential. Myoview results pending. Continue with lisinopril, toprol xl.  Anemia: Needs colonoscopy at some point. I will check anemia panel. Hb stable.                       LOS: 3 days   Dazia Lippold M.D.  [facility] 03/02/2011, 1:21 PM

## 2011-03-02 NOTE — ED Provider Notes (Signed)
Medical screening examination/treatment/procedure(s) were performed by non-physician practitioner and as supervising physician I was immediately available for consultation/collaboration.   Laray Anger, DO 03/02/11 1106

## 2011-03-02 NOTE — Progress Notes (Signed)
SUBJECTIVE:  Denies any chest pain or SOB   PHYSICAL EXAM Filed Vitals:   03/01/11 1344 03/01/11 1347 03/01/11 2110 03/02/11 0439  BP: 111/66 106/66 123/78 146/76  Pulse: 74 74 76 73  Temp:   98.7 F (37.1 C) 98.5 F (36.9 C)  TempSrc:   Oral Oral  Resp:   18 18  Height:      Weight:    61.236 kg (135 lb)  SpO2:   99% 98%   General:  No distress Lungs:  Clear Heart:  RRR, no murmur Abdomen:  Positive bowel sounds, no rebound no guarding Extremities:  No edema  LABS: Lab Results  Component Value Date   CKTOTAL 97 02/28/2011   CKMB 3.5 02/28/2011   TROPONINI <0.30 02/28/2011   Results for orders placed during the hospital encounter of 02/27/11 (from the past 24 hour(s))  GLUCOSE, CAPILLARY     Status: Normal   Collection Time   03/01/11 11:39 AM      Component Value Range   Glucose-Capillary 87  70 - 99 (mg/dL)  GLUCOSE, CAPILLARY     Status: Abnormal   Collection Time   03/01/11  4:19 PM      Component Value Range   Glucose-Capillary 140 (*) 70 - 99 (mg/dL)   Comment 1 Notify RN    GLUCOSE, CAPILLARY     Status: Abnormal   Collection Time   03/01/11  9:10 PM      Component Value Range   Glucose-Capillary 109 (*) 70 - 99 (mg/dL)   Comment 1 Notify RN     Comment 2 Documented in Chart    CBC     Status: Abnormal   Collection Time   03/02/11  6:00 AM      Component Value Range   WBC 7.1  4.0 - 10.5 (K/uL)   RBC 3.85 (*) 4.22 - 5.81 (MIL/uL)   Hemoglobin 10.0 (*) 13.0 - 17.0 (g/dL)   HCT 16.1 (*) 09.6 - 52.0 (%)   MCV 80.8  78.0 - 100.0 (fL)   MCH 26.0  26.0 - 34.0 (pg)   MCHC 32.2  30.0 - 36.0 (g/dL)   RDW 04.5  40.9 - 81.1 (%)   Platelets 276  150 - 400 (K/uL)  BASIC METABOLIC PANEL     Status: Abnormal   Collection Time   03/02/11  6:00 AM      Component Value Range   Sodium 133 (*) 135 - 145 (mEq/L)   Potassium 4.0  3.5 - 5.1 (mEq/L)   Chloride 101  96 - 112 (mEq/L)   CO2 23  19 - 32 (mEq/L)   Glucose, Bld 88  70 - 99 (mg/dL)   BUN 15  6 - 23 (mg/dL)     Creatinine, Ser 9.14  0.50 - 1.35 (mg/dL)   Calcium 8.8  8.4 - 78.2 (mg/dL)   GFR calc non Af Amer 80 (*) >90 (mL/min)   GFR calc Af Amer >90  >90 (mL/min)    Intake/Output Summary (Last 24 hours) at 03/02/11 0821 Last data filed at 03/02/11 0440  Gross per 24 hour  Intake    960 ml  Output    375 ml  Net    585 ml    Telemetry:  No sustained arrhythmias  ASSESSMENT AND PLAN:  1. Syncope:  Etiology unclear.  Question hyp0tension vs arrhythmia.  Management will be as below  2. Newly identified cardiomyopathy with EF 30-35%:   On low dose ACE and  beta blocker.  I would suggest low dose Toprol XL at discharge as it is once daily.  Lexiscan ordered to evaluate etiology.  3. Difficult social situation with EtOH abuse, ongoing tobacco abuse, & homelessness  4. Recent GIB with FOBT+ stool on admission and residual mild anemia:  Per primary team.  5. Left carotid bruit:  60 - 79% left stenosis.  This will need follow up in six months.    Fayrene Fearing Lugene Hitt 03/02/2011 8:21 AM

## 2011-03-03 LAB — BASIC METABOLIC PANEL
BUN: 17 mg/dL (ref 6–23)
Chloride: 100 mEq/L (ref 96–112)
GFR calc Af Amer: >90 mL/min (ref >90)
Potassium: 4.4 mEq/L (ref 3.5–5.1)

## 2011-03-03 LAB — CBC
HCT: 32.3 % — ABNORMAL LOW (ref 39.0–52.0)
Hemoglobin: 10.2 g/dL — ABNORMAL LOW (ref 13.0–17.0)
RDW: 15.5 % (ref 11.5–15.5)
WBC: 8 10*3/uL (ref 4.0–10.5)

## 2011-03-03 LAB — GLUCOSE, CAPILLARY
Glucose-Capillary: 97 mg/dL (ref 70–99)
Glucose-Capillary: 101 mg/dL — ABNORMAL HIGH (ref 70–99)

## 2011-03-03 MED ORDER — FERROUS SULFATE 325 (65 FE) MG PO TABS
325.0000 mg | ORAL_TABLET | Freq: Two times a day (BID) | ORAL | Status: DC
  Administered 2011-03-03 – 2011-03-05 (×4): 325 mg via ORAL
  Filled 2011-03-03 (×7): qty 1

## 2011-03-03 MED ORDER — SULFAMETHOXAZOLE-TRIMETHOPRIM 400-80 MG PO TABS
1.0000 | ORAL_TABLET | Freq: Two times a day (BID) | ORAL | Status: DC
  Administered 2011-03-03 – 2011-03-05 (×5): 1 via ORAL
  Filled 2011-03-03 (×6): qty 1

## 2011-03-03 MED ORDER — METOPROLOL SUCCINATE ER 25 MG PO TB24
25.0000 mg | ORAL_TABLET | Freq: Every day | ORAL | Status: DC
  Administered 2011-03-03: 12.5 mg via ORAL
  Administered 2011-03-04 – 2011-03-05 (×2): 25 mg via ORAL
  Filled 2011-03-03 (×3): qty 1

## 2011-03-03 NOTE — Progress Notes (Signed)
Subjective: Still coughing, no dyspnea.  Objective: Filed Vitals:   03/02/11 1258 03/02/11 1259 03/02/11 2100 03/03/11 0534  BP: 101/58 101/58 120/64 142/72  Pulse: 87  79 68  Temp:   97.9 F (36.6 C) 97.4 F (36.3 C)  TempSrc:   Oral Oral  Resp:   18 18  Height:      Weight:    60.7 kg (133 lb 13.1 oz)  SpO2:   98% 100%   Weight change: -0.536 kg (-1 lb 2.9 oz)  Intake/Output Summary (Last 24 hours) at 03/03/11 1028 Last data filed at 03/03/11 0500  Gross per 24 hour  Intake      3 ml  Output      1 ml  Net      2 ml    General: Alert, awake, oriented x3, in no acute distress.  HEENT: No bruits, no goiter.  Heart: Regular rate and rhythm, without murmurs, rubs, gallops.  Lungs: Crackles left side, bilateral air movement.  Abdomen: Soft, nontender, nondistended, positive bowel sounds.  Neuro: Grossly intact, nonfocal. Extremities; no edema.   Lab Results:  Basename 03/03/11 0500 03/02/11 0600  NA 133* 133*  K 4.4 4.0  CL 100 101  CO2 25 23  GLUCOSE 95 88  BUN 17 15  CREATININE 0.91 0.86  CALCIUM 9.1 8.8  MG -- --  PHOS -- --    Basename 02/28/11 1211  LIPASE 50  AMYLASE --    Basename 03/03/11 0500 03/02/11 0600  WBC 8.0 7.1  NEUTROABS -- --  HGB 10.2* 10.0*  HCT 32.3* 31.1*  MCV 81.6 80.8  PLT 273 276    Basename 02/28/11 1630  CKTOTAL 97  CKMB 3.5  CKMBINDEX --  TROPONINI <0.30    Basename 03/02/11 1356  VITAMINB12 435  FOLATE 19.5  FERRITIN 21*  TIBC 376  IRON 20*  RETICCTPCT 0.6    Micro Results: No results found for this or any previous visit (from the past 240 hour(s)).  Studies/Results: Nm Myocar Multi W/spect W/wall Motion / Ef  03/02/2011  *RADIOLOGY REPORT*  Clinical Data:  78 year old with chest pain and cardiomyopathy.  MYOCARDIAL IMAGING WITH SPECT (REST AND PHARMACOLOGIC-STRESS) GATED LEFT VENTRICULAR WALL MOTION STUDY LEFT VENTRICULAR EJECTION FRACTION  Technique:  Standard myocardial SPECT imaging was performed after  resting intravenous injection of 10 mCi Tc-79m tetrofosmin. Subsequently, intravenous infusion of regadenoson was performed under the supervision of the Cardiology staff.  At peak effect of the drug, 30 mCi Tc-76m tetrofosmin was injected intravenously and standard myocardial SPECT  imaging was performed.  Quantitative gated imaging was also performed to evaluate left ventricular wall motion, and estimate left ventricular ejection fraction.  Comparison:  None.  Findings: There is decreased uptake along the inferior lateral wall on both rest and stress images consistent with an area of scarring. There is slightly decreased uptake along the anterior septal wall on the stress images but this is probably artifactual rather than a true reversible defect. There is diffuse hypokinesia of the left ventricle with more pronounced hypokinesia along the septal and inferior walls.  The end-diastolic volume is 143 ml and end- systolic volume is 92 ml.  Calculated ejection fraction is 36%. There is gut artifact on the stress images.  IMPRESSION: There is no clear evidence for a reversible defect or pharmacologically induced ischemia.  The anterior septal wall is equivocal as described.  Diffuse hypokinesia, particularly along the inferior and septal walls.  Ejection fraction is 36%.  Possible scarring along  the posterior lateral wall.  Original Report Authenticated By: Richarda Overlie, M.D.    Medications: I have reviewed the patient's current medications.   Syncope (02/27/2011)  ? Probably 2 to decrease volume. Monitor for arrythmia. ECHO showed Cardiomyopathy Ef 30 %. Neuro exam non focal. CT head negative.   Cardiomyopathy Ef 30 %: New diagnosis, no prior ECHO on records. Possible related to alcohol but Ischemic is in the differential.  Myoview results pending. Continue with lisinopril, toprol xl. Medical management.   Anemia: Needs colonoscopy at some point. Iron deficiency.  Hb stable. I will start ferrous sulfate.    Pancreatitis:  Patient presents with abdominal pain, has mild increase lipase. [place] order due to mild increase alk phosphatase. US show cholelithiasis.  Patient has history of alcohol use. Appreciate surgery recommendation. No surgery at this time. Pancreatitis probably from alcohol.  Resolved , tolerating diet.   Alcoholism (02/02/2011) No history of DT, Last alcohol drink last Saturday. Continue with thiamine and folate.   Dehydration (02/27/2011) Resolved. NSL.   Bronchitis: I will start bactrim.    Left carotid bruit: 60 - 79% left stenosis. need follow up in six months.  Disposition : discharge when ok with cardio.      LOS: 4 days   REGALADO,BELKYS M.D.  [facility] 03/03/2011, 10:28 AM

## 2011-03-03 NOTE — Progress Notes (Signed)
03/03/11 0820 Nursing Note: Pt has no IV access due to pulling IV out x 2. Pt refuses to have another IV place by nurse or IV team. MD called and made aware, no orders recieved. Will continue to monitor pt. George Hugh RN

## 2011-03-03 NOTE — Progress Notes (Signed)
SUBJECTIVE:  Denies any chest pain or SOB.  Breathing much better than he was on admission.  PHYSICAL EXAM Filed Vitals:   03/02/11 1258 03/02/11 1259 03/02/11 2100 03/03/11 0534  BP: 101/58 101/58 120/64 142/72  Pulse: 87  79 68  Temp:   97.9 F (36.6 C) 97.4 F (36.3 C)  TempSrc:   Oral Oral  Resp:   18 18  Height:      Weight:    60.7 kg (133 lb 13.1 oz)  SpO2:   98% 100%   General:  No distress Lungs:  Clear Heart:  RRR, no murmur Abdomen:  Positive bowel sounds, no rebound no guarding Extremities:  No edema  LABS: Lab Results  Component Value Date   CKTOTAL 97 02/28/2011   CKMB 3.5 02/28/2011   TROPONINI <0.30 02/28/2011   Results for orders placed during the hospital encounter of 02/27/11 (from the past 24 hour(s))  GLUCOSE, CAPILLARY     Status: Abnormal   Collection Time   03/02/11  8:07 AM      Component Value Range   Glucose-Capillary 101 (*) 70 - 99 (mg/dL)   Comment 1 Documented in Chart     Comment 2 Notify RN    GLUCOSE, CAPILLARY     Status: Abnormal   Collection Time   03/02/11  1:46 PM      Component Value Range   Glucose-Capillary 110 (*) 70 - 99 (mg/dL)   Comment 1 Documented in Chart     Comment 2 Notify RN    VITAMIN B12     Status: Normal   Collection Time   03/02/11  1:56 PM      Component Value Range   Vitamin B-12 435  211 - 911 (pg/mL)  FOLATE     Status: Normal   Collection Time   03/02/11  1:56 PM      Component Value Range   Folate 19.5    IRON AND TIBC     Status: Abnormal   Collection Time   03/02/11  1:56 PM      Component Value Range   Iron 20 (*) 42 - 135 (ug/dL)   TIBC 409  811 - 914 (ug/dL)   Saturation Ratios 5 (*) 20 - 55 (%)   UIBC 356  125 - 400 (ug/dL)  FERRITIN     Status: Abnormal   Collection Time   03/02/11  1:56 PM      Component Value Range   Ferritin 21 (*) 22 - 322 (ng/mL)  RETICULOCYTES     Status: Abnormal   Collection Time   03/02/11  1:56 PM      Component Value Range   Retic Ct Pct 0.6  0.4 - 3.1  (%)   RBC. 3.93 (*) 4.22 - 5.81 (MIL/uL)   Retic Count, Manual 23.6  19.0 - 186.0 (K/uL)  GLUCOSE, CAPILLARY     Status: Abnormal   Collection Time   03/02/11  4:56 PM      Component Value Range   Glucose-Capillary 124 (*) 70 - 99 (mg/dL)   Comment 1 Documented in Chart     Comment 2 Notify RN    GLUCOSE, CAPILLARY     Status: Abnormal   Collection Time   03/02/11  9:04 PM      Component Value Range   Glucose-Capillary 110 (*) 70 - 99 (mg/dL)   Comment 1 Notify RN     Comment 2 Documented in Chart    CBC  Status: Abnormal   Collection Time   03/03/11  5:00 AM      Component Value Range   WBC 8.0  4.0 - 10.5 (K/uL)   RBC 3.96 (*) 4.22 - 5.81 (MIL/uL)   Hemoglobin 10.2 (*) 13.0 - 17.0 (g/dL)   HCT 16.1 (*) 09.6 - 52.0 (%)   MCV 81.6  78.0 - 100.0 (fL)   MCH 25.8 (*) 26.0 - 34.0 (pg)   MCHC 31.6  30.0 - 36.0 (g/dL)   RDW 04.5  40.9 - 81.1 (%)   Platelets 273  150 - 400 (K/uL)  GLUCOSE, CAPILLARY     Status: Normal   Collection Time   03/03/11  6:18 AM      Component Value Range   Glucose-Capillary 98  70 - 99 (mg/dL)   Comment 1 Notify RN      Intake/Output Summary (Last 24 hours) at 03/03/11 0805 Last data filed at 03/03/11 0500  Gross per 24 hour  Intake      3 ml  Output    201 ml  Net   -198 ml   Lexiscan Myoview:  IMPRESSION:  There is no clear evidence for a reversible defect or  pharmacologically induced ischemia. Diffuse hypokinesia, particularly along the inferior and septal  walls. There is slightly decreased uptake along the anterior septal wall  on the stress images but this is probably artifactual rather than a  true reversible defect. Ejection fraction is 36%.  Possible scarring along the posterior lateral wall.    ASSESSMENT AND PLAN:  1. Syncope:  Etiology unclear.  Question hypotension vs arrhythmia.  Medical management as below.  2. Newly identified cardiomyopathy with EF 30-35%:   On low dose ACE and beta blocker. Lexiscan as above.  Low risk.   Cannot exclude an ischemic etiology but no reversible defects.  No invasive imaging needed.  Medical management.  Switched to Toprol yesterday to try for once daily drugs.  I increased this dose today.  3. Left carotid bruit:  60 - 79% left stenosis.  This will need follow up in six months.   Fayrene Fearing [facility] 03/03/2011 8:05 AM

## 2011-03-03 NOTE — Progress Notes (Signed)
03/03/11 1550 Nursing Note: Pt has received CHF teaching and booklet. Pt verbalizes understanding. Will continue to educate pt. Troi Bechtold Scientist, clinical (histocompatibility and immunogenetics).

## 2011-03-04 LAB — GLUCOSE, CAPILLARY
Glucose-Capillary: 94 mg/dL (ref 70–99)
Glucose-Capillary: 115 mg/dL — ABNORMAL HIGH (ref 70–99)
Glucose-Capillary: 102 mg/dL — ABNORMAL HIGH (ref 70–99)

## 2011-03-04 MED ORDER — FOLIC ACID 1 MG PO TABS
1.0000 mg | ORAL_TABLET | Freq: Every day | ORAL | Status: DC
  Administered 2011-03-04 – 2011-03-05 (×2): 1 mg via ORAL
  Filled 2011-03-04 (×2): qty 1

## 2011-03-04 MED ORDER — VITAMIN B-1 100 MG PO TABS
100.0000 mg | ORAL_TABLET | Freq: Every day | ORAL | Status: DC
  Filled 2011-03-04: qty 1

## 2011-03-04 MED ORDER — VITAMIN B-1 100 MG PO TABS
100.0000 mg | ORAL_TABLET | Freq: Every day | ORAL | Status: DC
  Administered 2011-03-04 – 2011-03-05 (×2): 100 mg via ORAL
  Filled 2011-03-04 (×2): qty 1

## 2011-03-04 MED ORDER — FOLIC ACID 1 MG PO TABS
1.0000 mg | ORAL_TABLET | Freq: Every day | ORAL | Status: DC
  Filled 2011-03-04: qty 1

## 2011-03-04 NOTE — Progress Notes (Signed)
Found Prozac and Lamictal in pt room on bedside table.  Pt stated, "don't take those I can sell them on the street". Medication was observed and did not belong to the pt.  Medication was taken and Harriett Sine from fifth floor pharmacy told us to give the medication to them so they could dispose of it.

## 2011-03-04 NOTE — Clinical Documentation Improvement (Signed)
GENERIC DOCUMENTATION CLARIFICATION QUERY  THIS DOCUMENT IS NOT A PERMANENT PART OF THE MEDICAL RECORD   Please update your documentation within the medical record to reflect your response to this query.                                                                                         03/04/11   Dr. Antoine Poche,  In a better effort to capture your patient's severity of illness, reflect appropriate length of stay and utilization of resources, a review of the patient medical record has revealed the following indicators.    Based on your clinical judgment ,please document the possible or probable type of cardiomyopathy in the progress notes and discharge summary:  - Possible or Probable Nonischemic Cardiomyopathy  - Possible or Probable Alcoholic Cardiomyopathy  - Other Type of Cardiomyopathy (please specify)  - Unable to Clinically Determine   Clinical Information:   "Newly identified cardiomyopathy with EF 30-35%: On low dose ACE and beta blocker. Lexiscan as above. Low risk. Cannot exclude an ischemic etiology but no reversible defects. No invasive imaging needed. Medical management. Switched to Toprol yesterday to try for once daily drugs. I increased this dose today." Rollene Rotunda 03/03/2011 8:05 AM  03/02/11  Lexiscan Myoview: IMPRESSION:  There is no clear evidence for a reversible defect or  pharmacologically induced ischemia. Diffuse hypokinesia, particularly along the inferior and septal  walls. There is slightly decreased uptake along the anterior septal wall  on the stress images but this is probably artifactual rather than a  true reversible defect. Ejection fraction is 36%.  Possible scarring along the posterior lateral wall.   02/28/11 Echo Left ventricle: The cavity size was normal. There was moderate concentric hypertrophy. Systolic function was moderately to severely reduced. The estimated ejection fraction was in the range of 30% to 35%.  Diffuse hypokinesis.   In responding to this query please exercise your independent judgment.  The fact that a query is asked, does not imply that any particular answer is desired or expected.    Reviewed: 03/05/11 Dr.Cooper documented "nonischemic cm" in pn 03/05/11.  Mathis Dad.  Thank You,  Jerral Ralph  RN BSN Certified Clinical Documentation Specialist: Cell   [phone]  Health Information Management [place]  TO RESPOND TO THE THIS QUERY, FOLLOW THE INSTRUCTIONS BELOW:  1. If needed, update documentation for the patient's encounter via the notes activity.  2. Access this query again and click edit on the In [employer].  3. After updating, or not, click F2 to complete all highlighted (required) fields concerning your review. Select "additional documentation in the medical record" OR "no additional documentation provided".  4. Click Sign note button.  5. The deficiency will fall out of your In Basket *Please let us know if you are not able to complete this workflow by phone or e-mail (listed below).

## 2011-03-04 NOTE — Progress Notes (Signed)
Subjective: Feeling ok, coughing some. No dyspnea.  Objective: Filed Vitals:   03/03/11 1049 03/03/11 1400 03/03/11 2121 03/04/11 0349  BP: 130/70 143/83 113/92 125/64  Pulse:  65 66 64  Temp:  97.9 F (36.6 C) 98.2 F (36.8 C) 98 F (36.7 C)  TempSrc:  Oral Oral Oral  Resp:  20 18 18   Height:      Weight:    61.7 kg (136 lb 0.4 oz)  SpO2:  100% 100% 100%   Weight change: 1 kg (2 lb 3.3 oz)  Intake/Output Summary (Last 24 hours) at 03/04/11 1048 Last data filed at 03/04/11 0856  Gross per 24 hour  Intake   1200 ml  Output    300 ml  Net    900 ml    General: Alert, awake, oriented x3, in no acute distress.  HEENT: No bruits, no goiter.  Heart: Regular rate and rhythm, without murmurs, rubs, gallops.  Lungs: ronchi, bilateral air movement.  Abdomen: Soft, nontender, nondistended, positive bowel sounds.  Neuro: Grossly intact, nonfocal. Extremities; trace edema.   Lab Results:  Basename 03/03/11 0500 03/02/11 0600  NA 133* 133*  K 4.4 4.0  CL 100 101  CO2 25 23  GLUCOSE 95 88  BUN 17 15  CREATININE 0.91 0.86  CALCIUM 9.1 8.8  MG -- --  PHOS -- --    Basename 03/03/11 0500 03/02/11 0600  WBC 8.0 7.1  NEUTROABS -- --  HGB 10.2* 10.0*  HCT 32.3* 31.1*  MCV 81.6 80.8  PLT 273 276    Basename 03/02/11 1356  VITAMINB12 435  FOLATE 19.5  FERRITIN 21*  TIBC 376  IRON 20*  RETICCTPCT 0.6    Studies/Results: Nm Myocar Multi W/spect W/wall Motion / Ef  03/02/2011  *RADIOLOGY REPORT*  Clinical Data:  78 year old with chest pain and cardiomyopathy.  MYOCARDIAL IMAGING WITH SPECT (REST AND PHARMACOLOGIC-STRESS) GATED LEFT VENTRICULAR WALL MOTION STUDY LEFT VENTRICULAR EJECTION FRACTION  Technique:  Standard myocardial SPECT imaging was performed after resting intravenous injection of 10 mCi Tc-110m tetrofosmin. Subsequently, intravenous infusion of regadenoson was performed under the supervision of the Cardiology staff.  At peak effect of the drug, 30 mCi Tc-69m  tetrofosmin was injected intravenously and standard myocardial SPECT  imaging was performed.  Quantitative gated imaging was also performed to evaluate left ventricular wall motion, and estimate left ventricular ejection fraction.  Comparison:  None.  Findings: There is decreased uptake along the inferior lateral wall on both rest and stress images consistent with an area of scarring. There is slightly decreased uptake along the anterior septal wall on the stress images but this is probably artifactual rather than a true reversible defect. There is diffuse hypokinesia of the left ventricle with more pronounced hypokinesia along the septal and inferior walls.  The end-diastolic volume is 143 ml and end- systolic volume is 92 ml.  Calculated ejection fraction is 36%. There is gut artifact on the stress images.  IMPRESSION: There is no clear evidence for a reversible defect or pharmacologically induced ischemia.  The anterior septal wall is equivocal as described.  Diffuse hypokinesia, particularly along the inferior and septal walls.  Ejection fraction is 36%.  Possible scarring along the posterior lateral wall.  Original Report Authenticated By: Richarda Overlie, M.D.    Medications: I have reviewed the patient's current medications.  Syncope (02/27/2011)  ? Probably 2 to decrease volume. Monitor for arrythmia. ECHO showed Cardiomyopathy Ef 30 %. Neuro exam non focal. CT head negative.  Cardiomyopathy  Ef 30 %: New diagnosis, no prior ECHO on records. Possible related to alcohol but Ischemic is in the differential.  Myoview results pending. Continue with lisinopril, toprol xl. Medical management.  Anemia: Needs colonoscopy at some point. Iron deficiency. Hb stable. Continue with  ferrous sulfate.   Pancreatitis:  Patient presents with abdominal pain, has mild increase lipase. [place] order due to mild increase alk phosphatase. US show cholelithiasis.  Patient has history of alcohol use. Appreciate surgery  recommendation. No surgery at this time. Pancreatitis probably from alcohol.  Resolved , tolerating diet.  Alcoholism (02/02/2011) No history of DT, Last alcohol drink last Saturday. Continue with thiamine and folate.   Dehydration (02/27/2011) Resolved. NSL.  Bronchitis:  Continue with  bactrim.  Left carotid bruit: 60 - 79% left stenosis. need follow up in six months.   Disposition: Discharge when cardio agree. . Also SW to help with homeless situation.       LOS: 5 days   Sherryn Pollino M.D.  [facility] 03/04/2011, 10:48 AM

## 2011-03-05 DIAGNOSIS — R55 Syncope and collapse: Secondary | ICD-10-CM

## 2011-03-05 LAB — GLUCOSE, CAPILLARY: Glucose-Capillary: 92 mg/dL (ref 70–99)

## 2011-03-05 MED ORDER — SULFAMETHOXAZOLE-TRIMETHOPRIM 400-80 MG PO TABS: 1.0000 | ORAL_TABLET | Freq: Two times a day (BID) | ORAL | Status: AC

## 2011-03-05 MED ORDER — METOPROLOL SUCCINATE ER 25 MG PO TB24: 25.0000 mg | ORAL_TABLET | Freq: Every day | ORAL | Status: DC

## 2011-03-05 MED ORDER — FERROUS SULFATE 325 (65 FE) MG PO TABS: 325.0000 mg | ORAL_TABLET | Freq: Two times a day (BID) | ORAL | Status: DC

## 2011-03-05 MED ORDER — LISINOPRIL 5 MG PO TABS: 5.0000 mg | ORAL_TABLET | Freq: Every day | ORAL | Status: DC

## 2011-03-05 NOTE — Progress Notes (Addendum)
Admit date: 02/27/2011 Discharge date: 03/05/2011  Primary Care Physician:  No primary provider on file.   DISCHARGE SUMMARIES.  Discharge Diagnoses:   Syncope, unclear etiology ? Hypovolemia. New Diagnoses cardiomyopathy ejection fraction 30% probably  related to alcohol.  Bronchitis Alcohol pancreatitis, resolved. Cholelithiasis, no surgery at this time.  Iron deficiency anemia, need referral for colonoscopy. Alcoholism Left carotid bruit: 60 - 79% left stenosis.   DISCHARGE MEDICATION: Medication List  As of 03/05/2011  1:39 PM   TAKE these medications         ferrous sulfate 325 (65 FE) MG tablet   Take 1 tablet (325 mg total) by mouth 2 (two) times daily with a meal.      lisinopril 5 MG tablet   Commonly known as: PRINIVIL,ZESTRIL   Take 1 tablet (5 mg total) by mouth daily.      metoprolol succinate 25 MG 24 hr tablet   Commonly known as: TOPROL-XL   Take 1 tablet (25 mg total) by mouth daily.      sulfamethoxazole-trimethoprim 400-80 MG per tablet   Commonly known as: BACTRIM,SEPTRA   Take 1 tablet by mouth every 12 (twelve) hours.              Consults: Treatment Team:  Micheline Chapman, MD   SIGNIFICANT DIAGNOSTIC STUDIES:  Dg Chest 2 View  02/27/2011  *RADIOLOGY REPORT*  Clinical Data: Syncope, weakness, shortness of breath, smoker  CHEST - 2 VIEW  Comparison: 02/02/2011  Findings: Normal heart size, mediastinal contours, and pulmonary vascularity. Atherosclerotic calcification aorta. Peribronchial thickening. Perihilar infiltrates slightly greater on the right, question pulmonary edema/CHF. No pleural effusion or pneumothorax. Osseous demineralization. 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  IMPRESSION: Mild CHF.  Original Report Authenticated By: Lollie Marrow, M.D.   Ct Head Wo Contrast  02/27/2011  *RADIOLOGY REPORT*  Clinical Data: Dizziness and nausea since this morning.  Near- syncopal episode.  Possible trauma.  CT HEAD WITHOUT CONTRAST   Technique:  Contiguous axial images were obtained from the base of the skull through the vertex without contrast.  Comparison: 02/02/2011  Findings: Bone windows demonstrate clear paranasal sinuses and mastoid air cells.  Soft tissue windows demonstrate moderate low density in the periventricular white matter, likely related to small vessel disease. Expected cerebral atrophy. No  mass lesion, hemorrhage, hydrocephalus, acute infarct, intra-axial, or extra-axial fluid collection.  IMPRESSION:  1. No acute intracranial abnormality. 2. Cerebral atrophy and small vessel ischemic change.  Original Report Authenticated By: Consuello Bossier, M.D.   US Abdomen Complete  02/28/2011  *RADIOLOGY REPORT*  Clinical Data:  Right upper quadrant pain, pancreatitis  ABDOMINAL ULTRASOUND COMPLETE  Comparison:  None.  Findings:  Gallbladder:  There are multiple gallstones within the gallbladder lumen.  The largest measures approximately 8 mm.  There is no gallbladder wall thickening or pericholecystic fluid.  Negative sonographic Murphy's sign.  Common Bile Duct:  Within normal limits in caliber. Measures 4.6 mm.  Liver: No focal mass lesion identified.  Within normal limits in parenchymal echogenicity.  IVC:  Appears normal.  Pancreas:  No abnormality identified.  Spleen:  Within normal limits in size and echotexture.  Right kidney:  Normal in size and parenchymal echogenicity.  No evidence of mass or hydronephrosis.  Left kidney:  Normal in size and parenchymal echogenicity.  No evidence of mass or hydronephrosis.  Abdominal Aorta:  No aneurysm identified.  IMPRESSION: Cholelithiasis without sonographic evidence of cholecystitis.  Original Report Authenticated By: Brandon Melnick, M.D.  Nm Myocar Multi W/spect W/wall Motion / Ef  03/02/2011  *RADIOLOGY REPORT*  Clinical Data:  78 year old with chest pain and cardiomyopathy.  MYOCARDIAL IMAGING WITH SPECT (REST AND PHARMACOLOGIC-STRESS) GATED LEFT VENTRICULAR WALL MOTION STUDY  LEFT VENTRICULAR EJECTION FRACTION  Technique:  Standard myocardial SPECT imaging was performed after resting intravenous injection of 10 mCi Tc-56m tetrofosmin. Subsequently, intravenous infusion of regadenoson was performed under the supervision of the Cardiology staff.  At peak effect of the drug, 30 mCi Tc-53m tetrofosmin was injected intravenously and standard myocardial SPECT  imaging was performed.  Quantitative gated imaging was also performed to evaluate left ventricular wall motion, and estimate left ventricular ejection fraction.  Comparison:  None.  Findings: There is decreased uptake along the inferior lateral wall on both rest and stress images consistent with an area of scarring. There is slightly decreased uptake along the anterior septal wall on the stress images but this is probably artifactual rather than a true reversible defect. There is diffuse hypokinesia of the left ventricle with more pronounced hypokinesia along the septal and inferior walls.  The end-diastolic volume is 143 ml and end- systolic volume is 92 ml.  Calculated ejection fraction is 36%. There is gut artifact on the stress images.  IMPRESSION: There is no clear evidence for a reversible defect or pharmacologically induced ischemia.  The anterior septal wall is equivocal as described.  Diffuse hypokinesia, particularly along the inferior and septal walls.  Ejection fraction is 36%.  Possible scarring along the posterior lateral wall.  Original Report Authenticated By: Richarda Overlie, M.D.     ECHO:  - Left ventricle: The cavity size was normal. There was moderate concentric hypertrophy. Systolic function was moderately to severely reduced. The estimated ejection fraction was in the range of 30% to 35%. Diffuse hypokinesis. - Aortic root: The aortic root was mildly dilated. - Left atrium: The atrium was moderately dilated.     Myoview:  IMPRESSION:  There is no clear evidence for a reversible defect or    pharmacologically induced ischemia. The anterior septal wall is  equivocal as described.  Diffuse hypokinesia, particularly along the inferior and septal  walls.  Ejection fraction is 36%.  Possible scarring along the posterior lateral wall.     BRIEF ADMITTING H & P: History of Present Illness:78 year old present after syncope event. Patient relates that he stand up felt lightheaded, and then pass out. He denies chest pain or dyspnea, no bowel or urinary incontinence. Had some diarrhea today , 2 BM watery stool. He is also complaining of abdominal pain started today. No vomiting. Lipase was mildly increase.   Hospital Course;  Syncope (02/27/2011)  ? Probably 2 to decrease volume. No evidence of arrythmia during this admission. ECHO showed Cardiomyopathy Ef 30 %. Neuro exam non focal. CT head negative.  Cardiomyopathy Ef 30 %: New diagnosis, no prior ECHO on records. Possible related to alcohol but Ischemic is in the differential.  Myoviewshowed no inducible ischemia. Continue with lisinopril, toprol xl. Medical management.  Anemia: Needs colonoscopy at some point. Iron deficiency. Hb stable. Continue with ferrous sulfate.   Pancreatitis:  Patient presents with abdominal pain, has mild increase lipase. [place] order due to mild increase alk phosphatase. US show cholelithiasis.  Patient has history of alcohol use. Appreciate surgery recommendation. No surgery at this time. Pancreatitis probably from alcohol.  Resolved , tolerating diet.  Alcoholism (02/02/2011) No history of DT. No evidence of DT during this hospitalization. Continue with thiamine and folate.  Dehydration (02/27/2011) Resolved. Bronchitis:  Patient was started on Bactrim. He has received 3 days of antibiotics. He will receive 3 more days of antibiotics. Left carotid bruit: 60 - 79% left stenosis. need follow up in six months.  Patient has an appointment at the [facility].  Social work helped, and found shelter that  will allow him to stay during the day also.  Date of discharge patient was in stable.   Disposition and Follow-up:  Discharge Orders    Future Orders Please Complete By Expires   Diet - low sodium heart healthy      Increase activity slowly        Follow-up Information    Follow up with [facility] on 04/15/2011. (hospital f/u at 2:30 with Dr. Andrey Campanile)    Contact information:   [phone]          DISCHARGE EXAM:  General: Alert, awake, oriented x3, in no acute distress.  HEENT: No bruits, no goiter.  Heart: Regular rate and rhythm, without murmurs, rubs, gallops.  Lungs: ronchi, bilateral air movement.  Abdomen: Soft, nontender, nondistended, positive bowel sounds.  Neuro: Grossly intact, nonfocal.  Extremities; trace edema.   Blood pressure 102/60, pulse 60, temperature 98.1 F (36.7 C), temperature source Oral, resp. rate 18, height 5\' 7"  (1.702 m), weight 62.3 kg (137 lb 5.6 oz), SpO2 95.00%.   Basename 03/03/11 0500  NA 133*  K 4.4  CL 100  CO2 25  GLUCOSE 95  BUN 17  CREATININE 0.91  CALCIUM 9.1  MG --  PHOS --    Basename 03/03/11 0500  WBC 8.0  NEUTROABS --  HGB 10.2*  HCT 32.3*  MCV 81.6  PLT 273    Signed: Aalina Brege M.D. 03/05/2011, 1:39 PM

## 2011-03-05 NOTE — Progress Notes (Signed)
Subjective:  Feels weak. No chest pain or dyspnea.  Objective:  Vital Signs in the last 24 hours: Temp:  [97.7 F (36.5 C)-98.1 F (36.7 C)] 98.1 F (36.7 C) (01/22 0408) Pulse Rate:  [55-60] 60  (01/22 1030) Resp:  [18-20] 18  (01/22 0408) BP: (102-119)/(51-61) 102/60 mmHg (01/22 1028) SpO2:  [95 %-97 %] 95 % (01/22 0408) Weight:  [62.3 kg (137 lb 5.6 oz)] 62.3 kg (137 lb 5.6 oz) (01/22 0408)  Intake/Output from previous day: 01/21 0701 - 01/22 0700 In: 960 [P.O.:960] Out: 400 [Urine:400]  Physical Exam: Pt is alert and oriented, NAD HEENT: normal Neck: JVP - normal, carotids 2+= without bruits Lungs: CTA bilaterally CV: RRR without murmur or gallop Abd: soft, NT, Positive BS, no hepatomegaly Ext: no C/C/E, distal pulses intact and equal Skin: warm/dry no rash   Lab Results:  Basename 03/03/11 0500  WBC 8.0  HGB 10.2*  PLT 273    Basename 03/03/11 0500  NA 133*  K 4.4  CL 100  CO2 25  GLUCOSE 95  BUN 17  CREATININE 0.91   No results found for this basename: TROPONINI:2,CK,MB:2 in the last 72 hours  Tele: sinus rhythm no significant arrhythmia  Assessment/Plan:  1. Syncope - ? Etiology. No arrhythmia on monitor. No further workup at this time.  2. Cardiomyopathy, likely alcohol-related (nonischemic). Myoview showed no inducible ischemia. Continue lisinopril and metoprolol succinate. I'm not sure he will take meds on an outpatient basis as he is homeless and lives in a tent. Social worker has been involved. 3. Disposition - states "I'm not going home - it's too cold." Will need social worker to help.   Tonny Bollman, M.D. 03/05/2011, 11:07 AM

## 2011-03-05 NOTE — Progress Notes (Signed)
Pt left the unit before he could receive any of his medications or his bus pass.  Pt stated,"Ive got to get outta here, I don't have time to be waiting around on yall, I got to get out of here before dark so I can find a room".  Tried to talk pt into staying for a couple more minutes but pt would not listen and he left.  Pt did not get to sign any d/c paperwork.  MD was notified.

## 2011-03-05 NOTE — Progress Notes (Signed)
Clinical Social Worker attempted to visit patient room to provide pt documentation to provide to [employer] in order to remain inside and a bus pass for transportation to [employer]. Clinical Social Worker was notified by RN that pt had left AMA. Clinical Social Worker signing off as pt left AMA and no further social work needs at this time.  Jacklynn Lewis, MSW, LCSWA  Clinical Social Work [phone]

## 2011-03-05 NOTE — Progress Notes (Signed)
   CARE MANAGEMENT NOTE 03/05/2011  Patient:  Dale Mcfarland, Dale Mcfarland   Account Number:  [id number]  Date Initiated:  02/28/2011  Documentation initiated by:  Letha Cape  Subjective/Objective Assessment:   dx dehydration, syncope  admit- homeless -lives in a tent.     Action/Plan:   medication ast.  CSW for shelter.   Anticipated DC Date:  03/05/2011   Anticipated DC Plan:  HOME/SELF CARE      DC Planning Services  CM consult  Medication Assistance  [facility]      Choice offered to / List presented to:             Status of service:  Completed, signed off Medicare Important Message given?   (If response is "NO", the following Medicare IM given date fields will be blank) Date Medicare IM given:   Date Additional Medicare IM given:    Discharge Disposition:  HOME/SELF CARE  Per UR Regulation:    Comments:  [facility]- appt for 3/4 at 2:30 with Dr. Andrey Campanile  03/05/11 16:31 Letha Cape RN, BSN [phone] patient left AMA.  03/05/11 13:56 Letha Cape RN, BSN [phone] Patient for dc today, patient will be going to a shelter. Patient does not have medicare part d so needed help with 3 day supply of meds, and follow up appt made at [facility]. CSW assisting with shelter and bus pass.  02/28/11 16:18 Letha Cape RN, BSN [phone] patient is homeless, patient does not have medicare part D for medications, patient is eligible for med ast if needed. Patient will need a bus pass for transportation, CSW referral.

## 2011-03-05 NOTE — Progress Notes (Signed)
Clinical Social Worker received handoff from previous social worker who states patient refusing shelter at discharge.  Clinical Social Worker spoke with patient at bedside about the possibility of discharging to [employer] until more permanent housing was available.  Patient was receptive stating that he would go as long as he could remain inside during the day.  MD agreeable to provide documentation at discharge stating that patient needed to be inside due to medical necessity.  Clinical Social Worker notified [employer] who was expecting patient arrival.  Per another CSW, upon arrival with bus pass and letter patient had left AMA.  Clinical Social Worker will sign off for now as social work intervention is no longer needed. Please consult [place] again if new need arises.  [address] [place], [place] [phone]

## 2011-03-05 NOTE — Progress Notes (Signed)
Subjective:  See my completed note this same date - this note is a duplicate   Tonny Bollman, M.D. 03/05/2011, 11:00 AM

## 2011-03-05 NOTE — Progress Notes (Signed)
   CARE MANAGEMENT NOTE 03/05/2011  Patient:  JEMEL, ONO   Account Number:  [id number]  Date Initiated:  02/28/2011  Documentation initiated by:  Letha Cape  Subjective/Objective Assessment:   dx dehydration, syncope  admit- homeless -lives in a tent.     Action/Plan:   medication ast.  CSW for shelter.   Anticipated DC Date:  03/05/2011   Anticipated DC Plan:  HOME/SELF CARE      DC Planning Services  CM consult  Medication Assistance  [facility]      Choice offered to / List presented to:             Status of service:  Completed, signed off Medicare Important Message given?   (If response is "NO", the following Medicare IM given date fields will be blank) Date Medicare IM given:   Date Additional Medicare IM given:    Discharge Disposition:  HOME/SELF CARE  Per UR Regulation:    Comments:  [facility]- appt for 3/4 at 2:30 with Dr. Andrey Campanile  03/05/11 13:56 Letha Cape RN, BSN [phone] Patient for dc today, patient will be going to a shelter. Patient does not have medicare part d so needed help with 3 day supply of meds, and follow up appt made at [facility]. CSW assisting with shelter and bus pass.  02/28/11 16:18 Letha Cape RN, BSN [phone] patient is homeless, patient does not have medicare part D for medications, patient is eligible for med ast if needed. Patient will need a bus pass for transportation, CSW referral.

## 2011-03-06 ENCOUNTER — Emergency Department (HOSPITAL_COMMUNITY)
Admission: EM | Admit: 2011-03-06 | Discharge: 2011-03-06 | Disposition: A | Discharge status: Home / Self Care | Payer: Medicare Other | Attending: Emergency Medicine | Admitting: Emergency Medicine

## 2011-03-06 ENCOUNTER — Other Ambulatory Visit: Payer: Self-pay

## 2011-03-06 ENCOUNTER — Emergency Department (HOSPITAL_COMMUNITY): Payer: Medicare Other

## 2011-03-06 ENCOUNTER — Encounter (HOSPITAL_COMMUNITY): Payer: Self-pay | Admitting: Emergency Medicine

## 2011-03-06 DIAGNOSIS — R42 Dizziness and giddiness: Secondary | ICD-10-CM | POA: Insufficient documentation

## 2011-03-06 DIAGNOSIS — R0602 Shortness of breath: Secondary | ICD-10-CM | POA: Insufficient documentation

## 2011-03-06 DIAGNOSIS — I10 Essential (primary) hypertension: Secondary | ICD-10-CM | POA: Insufficient documentation

## 2011-03-06 DIAGNOSIS — F3289 Other specified depressive episodes: Secondary | ICD-10-CM | POA: Insufficient documentation

## 2011-03-06 DIAGNOSIS — G8929 Other chronic pain: Secondary | ICD-10-CM | POA: Insufficient documentation

## 2011-03-06 DIAGNOSIS — J449 Chronic obstructive pulmonary disease, unspecified: Secondary | ICD-10-CM | POA: Insufficient documentation

## 2011-03-06 DIAGNOSIS — J4489 Other specified chronic obstructive pulmonary disease: Secondary | ICD-10-CM | POA: Insufficient documentation

## 2011-03-06 DIAGNOSIS — M549 Dorsalgia, unspecified: Secondary | ICD-10-CM | POA: Insufficient documentation

## 2011-03-06 DIAGNOSIS — Z5900 Homelessness unspecified: Secondary | ICD-10-CM | POA: Insufficient documentation

## 2011-03-06 DIAGNOSIS — Z79899 Other long term (current) drug therapy: Secondary | ICD-10-CM | POA: Insufficient documentation

## 2011-03-06 DIAGNOSIS — F329 Major depressive disorder, single episode, unspecified: Secondary | ICD-10-CM | POA: Insufficient documentation

## 2011-03-06 DIAGNOSIS — R55 Syncope and collapse: Secondary | ICD-10-CM | POA: Insufficient documentation

## 2011-03-06 LAB — URINALYSIS, ROUTINE W REFLEX MICROSCOPIC
Hgb urine dipstick: NEGATIVE
Ketones, ur: NEGATIVE mg/dL
Nitrite: NEGATIVE
Specific Gravity, Urine: 1.019 (ref 1.005–1.030)
Urobilinogen, UA: 0.2 mg/dL (ref 0.0–1.0)

## 2011-03-06 LAB — CBC
HCT: 35.1 % — ABNORMAL LOW (ref 39.0–52.0)
Hemoglobin: 11.1 g/dL — ABNORMAL LOW (ref 13.0–17.0)
MCH: 25.8 pg — ABNORMAL LOW (ref 26.0–34.0)
MCHC: 31.6 g/dL (ref 30.0–36.0)
MCV: 81.4 fL (ref 78.0–100.0)
RBC: 4.31 MIL/uL (ref 4.22–5.81)

## 2011-03-06 LAB — POCT I-STAT TROPONIN I: Troponin i, poc: 0 ng/mL (ref 0.00–0.08)

## 2011-03-06 LAB — DIFFERENTIAL
Basophils Relative: 0 % (ref 0–1)
Eosinophils Absolute: 0 10*3/uL (ref 0.0–0.7)
Eosinophils Relative: 0 % (ref 0–5)
Lymphs Abs: 1.2 10*3/uL (ref 0.7–4.0)
Monocytes Absolute: 1 10*3/uL (ref 0.1–1.0)
Monocytes Relative: 9 % (ref 3–12)

## 2011-03-06 LAB — BASIC METABOLIC PANEL
BUN: 33 mg/dL — ABNORMAL HIGH (ref 6–23)
Creatinine, Ser: 1.37 mg/dL — ABNORMAL HIGH (ref 0.50–1.35)
GFR calc Af Amer: 55 mL/min — ABNORMAL LOW (ref >90)
GFR calc non Af Amer: 47 mL/min — ABNORMAL LOW (ref >90)
Glucose, Bld: 84 mg/dL (ref 70–99)
Potassium: 4.6 mEq/L (ref 3.5–5.1)

## 2011-03-06 NOTE — ED Provider Notes (Addendum)
Patient presents with complaints of dizziness. Patient is homeless and has a history of alcohol abuse. He has been seen several times in the emergency room recently. She was recently admitted to the hospital as well. Clinically he appears in no distress at this time. We will recheck labs and reassess  Medical screening examination/treatment/procedure(s) were conducted as a shared visit with non-physician practitioner(s) and myself.  I personally evaluated the patient during the encounter   Date: 03/06/2011  Rate: 62  Rhythm: normal sinus rhythm  QRS Axis: normal  Intervals: normal except borderline prolonged qt  ST/T Wave abnormalities: normal  Conduction Disutrbances:none  Narrative Interpretation:   Old EKG Reviewed: unchanged except qt   Celene Kras, MD 03/06/11 1425  Celene Kras, MD 03/06/11 1440

## 2011-03-06 NOTE — ED Provider Notes (Signed)
History     CSN: [id number]  Arrival date & time 03/06/11  1348   First MD Initiated Contact with Patient 03/06/11 1349      Chief Complaint  Patient presents with  . Dizziness    (Consider location/radiation/quality/duration/timing/severity/associated sxs/prior treatment) HPI History provided by pt.   Pt had a syncopal episode this morning while standing at the store.  This is the fourth he has had in the past two weeks.  Prodrome of lightheadedness and dimmed vision.  Denies CP and palpitations but has had chronic and stable, exertional SOB.  Denies fever, cough, abdominal pain, vomiting, diarrhea, urinary sx.  Has been drinking fluids.  Pt is homeless and an alcoholic.  Drank 1 pint of gin yesterday evening but none this morning. Per prior chart, pt admitted 02/27/11 for syncope, thought to be vasovagal or secondary to hypovolemia.  Had a neg head CT, CXR showed mild CHF and stress test showed new cardiomyopathy w/ EF of 36%.  Pt was referred to [facility] and prescribed medications but eloped prior to receiving them.    Past Medical History  Diagnosis Date  . COPD (chronic obstructive pulmonary disease)   . Chronic back pain   . Pneumonia     01/2011 - CAP vs aspiration pneuonmia  . Depression   . PONV (postoperative nausea and vomiting)   . Hyponatremia     Previously felt secondary to SIADH  . Hypertension   . Upper GI bleed     01/2011 with EGD showing severe candida esophagitis and duodenal bulb erosion  . Anemia     In the setting of UGI bleed 01/2011 requiring blood transfusion  . Homelessness     Past Surgical History  Procedure Date  . Tonsillectomy   . Vasectomy   . Appendectomy   . Tonsillectomy   . Colonoscopy   . Esophagogastroduodenoscopy   . Esophagogastroduodenoscopy 02/03/2011    Procedure: ESOPHAGOGASTRODUODENOSCOPY (EGD);  Surgeon: Charna Elizabeth, MD;  Location: [facility];  Service: Endoscopy;  Laterality: N/A;  . Esophagogastroduodenoscopy  02/03/2011    Procedure: ESOPHAGOGASTRODUODENOSCOPY (EGD);  Surgeon: Charna Elizabeth, MD;  Location: [facility];  Service: Endoscopy;  Laterality: N/A;    Family History  Problem Relation Age of Onset  . Coronary artery disease Father     Starting in his 48's. Died of massive MI at age 52.  . Schizophrenia Brother   . Coronary artery disease Sister     MI at age 50  . Alzheimer's disease Mother     History  Substance Use Topics  . Smoking status: Current Some Day Smoker -- 1.0 packs/day for 60 years    Types: Cigarettes  . Smokeless tobacco: Never Used   Comment: Since age 55  . Alcohol Use: Yes     174 ml of scotch weekly      Review of Systems  All other systems reviewed and are negative.    Allergies  Penicillins  Home Medications   Current Outpatient Rx  Name Route Sig Dispense Refill  . FERROUS SULFATE 325 (65 FE) MG PO TABS Oral Take 1 tablet (325 mg total) by mouth 2 (two) times daily with a meal. 3 tablet 0  . LISINOPRIL 5 MG PO TABS Oral Take 1 tablet (5 mg total) by mouth daily. 3 tablet 0  . METOPROLOL SUCCINATE ER 25 MG PO TB24 Oral Take 1 tablet (25 mg total) by mouth daily. 3 tablet 0  . SULFAMETHOXAZOLE-TRIMETHOPRIM 400-80 MG PO TABS Oral Take 1  tablet by mouth every 12 (twelve) hours. 6 tablet 0    BP 104/57  Pulse 69  Temp(Src) 97.4 F (36.3 C) (Oral)  Resp 12  SpO2 98%  Physical Exam  Nursing note and vitals reviewed. Constitutional: He is oriented to person, place, and time. He appears well-developed and well-nourished. No distress.       Pt is still wearing gown from most recent hospital admission  HENT:  Head: Normocephalic and atraumatic.  Mouth/Throat: Oropharynx is clear and moist.  Eyes:       Normal appearance  Neck: Normal range of motion.  Cardiovascular: Normal rate and regular rhythm.   Pulmonary/Chest: Effort normal and breath sounds normal. No respiratory distress. He exhibits no tenderness.  Abdominal: Soft. Bowel sounds  are normal. He exhibits no distension. There is no tenderness. There is no guarding.  Musculoskeletal:       2+ Dorsalis Pedis pulses.  No peripheral edema or calf ttp.   Neurological: He is alert and oriented to person, place, and time.  Skin: Skin is warm and dry. No rash noted. He is not diaphoretic.  Psychiatric: He has a normal mood and affect. His behavior is normal.    ED Course  Procedures (including critical care time)  Date: 03/06/2011  Rate: 62  Rhythm: normal sinus rhythm  QRS Axis: normal  Intervals: normal  ST/T Wave abnormalities: normal  Conduction Disutrbances:none  Narrative Interpretation: borderline prolonged QT at  Old EKG Reviewed: unchanged   Labs Reviewed  CBC - Abnormal; Notable for the following:    Hemoglobin 11.1 (*)    HCT 35.1 (*)    MCH 25.8 (*)    All other components within normal limits  DIFFERENTIAL - Abnormal; Notable for the following:    Neutrophils Relative 79 (*)    Neutro Abs 8.3 (*)    Lymphocytes Relative 11 (*)    All other components within normal limits  BASIC METABOLIC PANEL - Abnormal; Notable for the following:    Sodium 132 (*)    BUN 33 (*)    Creatinine, Ser 1.37 (*)    GFR calc non Af Amer 47 (*)    GFR calc Af Amer 55 (*)    All other components within normal limits  URINALYSIS, ROUTINE W REFLEX MICROSCOPIC - Abnormal; Notable for the following:    Bilirubin Urine SMALL (*)    All other components within normal limits  POCT I-STAT TROPONIN I  GLUCOSE, CAPILLARY  I-STAT TROPONIN I  POCT CBG MONITORING   Dg Chest 2 View  03/06/2011  *RADIOLOGY REPORT*  Clinical Data: Syncope.  Shortness of breath.  CHEST - 2 VIEW  Comparison: 02/27/2011  Findings: Emphysema noted with multiple old bilateral rib fractures.  Callus formation associated with these rib fractures causes vague densities projecting over the lungs.  The patient is rotated to the right on today's exam, resulting in reduced diagnostic sensitivity and  specificity.   Atherosclerotic calcification of the aortic arch is present.  Interstitial accentuation suggests mild underlying pulmonary fibrosis.  No pleural effusion or pulmonary edema.  Mild thoracic spondylosis is present.  Several indistinct cardiac leads project over the thorax.  Bilateral nipple shadows noted.  IMPRESSION:  1.  Vague densities projecting over the lung fields appear to be attributable to callus formation from prior bilateral rib fractures, cardiac leads, and nipple shadows. 2.  Emphysema and interstitial accentuation suggesting mild fibrosis. 3.  Atherosclerosis. 4.  Mild thoracic spondylosis.  Original Report Authenticated By: Zollie Beckers  D. Ova Freshwater, M.D.     1. Syncope       MDM  Homeless, alcoholic 78yo M presents w/ c/o syncope for the fourth time in 2 weeks.  Prodrome of lightheadedness and denies CP, worse than baseline SOB and palpitations.  No h/o PE.  aAdmitted for same last week and had thorough evaluation that was sig for neg troponin, neg head CT and new cardiomyopathy w/ EF of 37%. Pt has a f/u appt w/ [facility] scheduled for 3/14.  On exam today, pt A&O, NAD, well hydrated, heart w/ RRR, lungs CTA, abd benign/non-tender, no peripheral edema/ttp.  EKG, labs and CXR unremarkable.  Syncope may be vasovagal or d/t mild dehydration.  Pt has been drinking water in ED and had something to eat.  Recommended that he drink plenty of fluids and f/u with his PCP as scheduled.  Return precautions discussed.         Dale Mcfarland, [place] 03/06/11 2021

## 2011-03-06 NOTE — ED Notes (Signed)
Was here yesterday for the same dizziness. Has been having them  For 3 years having them more frequently cbg 124

## 2011-03-06 NOTE — ED Notes (Signed)
cbg 98 

## 2011-03-06 NOTE — ED Notes (Signed)
Gave patient a urinal explain to him we need a urine sample.  Patient stated he needed a little time to urinate

## 2011-03-07 NOTE — ED Provider Notes (Signed)
Medical screening examination/treatment/procedure(s) were conducted as a shared visit with non-physician practitioner(s) and myself.  I personally evaluated the patient during the encounter   Nayan Proch R Merdis Snodgrass, MD 03/07/11 0731 

## 2011-03-27 ENCOUNTER — Other Ambulatory Visit: Payer: Self-pay

## 2011-03-27 ENCOUNTER — Inpatient Hospital Stay (HOSPITAL_COMMUNITY)
Admission: EM | Admit: 2011-03-27 | Discharge: 2011-03-30 | DRG: 292 | Discharge status: Against Medical Advice | Payer: Medicare Other | Attending: Internal Medicine | Admitting: Internal Medicine

## 2011-03-27 ENCOUNTER — Encounter (HOSPITAL_COMMUNITY): Payer: Self-pay | Admitting: Emergency Medicine

## 2011-03-27 ENCOUNTER — Emergency Department (HOSPITAL_COMMUNITY): Payer: Medicare Other

## 2011-03-27 DIAGNOSIS — F1010 Alcohol abuse, uncomplicated: Secondary | ICD-10-CM | POA: Diagnosis present

## 2011-03-27 DIAGNOSIS — M549 Dorsalgia, unspecified: Secondary | ICD-10-CM | POA: Diagnosis present

## 2011-03-27 DIAGNOSIS — Z8249 Family history of ischemic heart disease and other diseases of the circulatory system: Secondary | ICD-10-CM

## 2011-03-27 DIAGNOSIS — I2510 Atherosclerotic heart disease of native coronary artery without angina pectoris: Secondary | ICD-10-CM | POA: Diagnosis present

## 2011-03-27 DIAGNOSIS — Z79899 Other long term (current) drug therapy: Secondary | ICD-10-CM

## 2011-03-27 DIAGNOSIS — Z9119 Patient's noncompliance with other medical treatment and regimen: Secondary | ICD-10-CM

## 2011-03-27 DIAGNOSIS — D638 Anemia in other chronic diseases classified elsewhere: Secondary | ICD-10-CM | POA: Diagnosis present

## 2011-03-27 DIAGNOSIS — Z5900 Homelessness unspecified: Secondary | ICD-10-CM

## 2011-03-27 DIAGNOSIS — R0602 Shortness of breath: Secondary | ICD-10-CM | POA: Diagnosis present

## 2011-03-27 DIAGNOSIS — J449 Chronic obstructive pulmonary disease, unspecified: Secondary | ICD-10-CM | POA: Diagnosis present

## 2011-03-27 DIAGNOSIS — Z880 Allergy status to penicillin: Secondary | ICD-10-CM

## 2011-03-27 DIAGNOSIS — F17200 Nicotine dependence, unspecified, uncomplicated: Secondary | ICD-10-CM | POA: Diagnosis present

## 2011-03-27 DIAGNOSIS — I10 Essential (primary) hypertension: Secondary | ICD-10-CM | POA: Diagnosis present

## 2011-03-27 DIAGNOSIS — G8929 Other chronic pain: Secondary | ICD-10-CM | POA: Diagnosis present

## 2011-03-27 DIAGNOSIS — Z91199 Patient's noncompliance with other medical treatment and regimen due to unspecified reason: Secondary | ICD-10-CM

## 2011-03-27 DIAGNOSIS — D72829 Elevated white blood cell count, unspecified: Secondary | ICD-10-CM | POA: Diagnosis present

## 2011-03-27 DIAGNOSIS — J441 Chronic obstructive pulmonary disease with (acute) exacerbation: Secondary | ICD-10-CM | POA: Diagnosis present

## 2011-03-27 DIAGNOSIS — Z8701 Personal history of pneumonia (recurrent): Secondary | ICD-10-CM

## 2011-03-27 DIAGNOSIS — Z720 Tobacco use: Secondary | ICD-10-CM | POA: Diagnosis present

## 2011-03-27 DIAGNOSIS — I5023 Acute on chronic systolic (congestive) heart failure: Principal | ICD-10-CM | POA: Diagnosis present

## 2011-03-27 DIAGNOSIS — I509 Heart failure, unspecified: Secondary | ICD-10-CM | POA: Diagnosis present

## 2011-03-27 DIAGNOSIS — I2589 Other forms of chronic ischemic heart disease: Secondary | ICD-10-CM | POA: Diagnosis present

## 2011-03-27 LAB — CBC
HCT: 31.4 % — ABNORMAL LOW (ref 39.0–52.0)
Hemoglobin: 10.1 g/dL — ABNORMAL LOW (ref 13.0–17.0)
MCH: 25.7 pg — ABNORMAL LOW (ref 26.0–34.0)
MCHC: 32.2 g/dL (ref 30.0–36.0)
MCV: 79.9 fL (ref 78.0–100.0)
Platelets: 471 10*3/uL — ABNORMAL HIGH (ref 150–400)
RBC: 3.93 MIL/uL — ABNORMAL LOW (ref 4.22–5.81)
RDW: 15.3 % (ref 11.5–15.5)
WBC: 11.8 10*3/uL — ABNORMAL HIGH (ref 4.0–10.5)

## 2011-03-27 LAB — POCT I-STAT, CHEM 8
Glucose, Bld: 118 mg/dL — ABNORMAL HIGH (ref 70–99)
HCT: 31 % — ABNORMAL LOW (ref 39.0–52.0)
Hemoglobin: 10.5 g/dL — ABNORMAL LOW (ref 13.0–17.0)
Potassium: 3.5 mEq/L (ref 3.5–5.1)
TCO2: 25 mmol/L (ref 0–100)

## 2011-03-27 LAB — DIFFERENTIAL
Basophils Relative: 1 % (ref 0–1)
Eosinophils Absolute: 0.3 10*3/uL (ref 0.0–0.7)
Eosinophils Relative: 2 % (ref 0–5)
Monocytes Relative: 5 % (ref 3–12)
Neutrophils Relative %: 81 % — ABNORMAL HIGH (ref 43–77)

## 2011-03-27 MED ORDER — IPRATROPIUM BROMIDE 0.02 % IN SOLN
0.5000 mg | Freq: Once | RESPIRATORY_TRACT | Status: AC
  Administered 2011-03-27: 0.5 mg via RESPIRATORY_TRACT
  Filled 2011-03-27: qty 2.5

## 2011-03-27 MED ORDER — ALBUTEROL SULFATE (5 MG/ML) 0.5% IN NEBU
INHALATION_SOLUTION | RESPIRATORY_TRACT | Status: AC
  Filled 2011-03-27: qty 1

## 2011-03-27 MED ORDER — ONDANSETRON HCL 4 MG/2ML IJ SOLN
INTRAMUSCULAR | Status: AC
  Filled 2011-03-27: qty 2

## 2011-03-27 MED ORDER — ALBUTEROL SULFATE (5 MG/ML) 0.5% IN NEBU
2.5000 mg | INHALATION_SOLUTION | Freq: Once | RESPIRATORY_TRACT | Status: AC
  Administered 2011-03-27: 2.5 mg via RESPIRATORY_TRACT
  Filled 2011-03-27 (×2): qty 0.5

## 2011-03-27 NOTE — ED Notes (Signed)
Per ems, the patient states he has had n/v and hasnt drank for 3 weeks and for the past hour has felt sick.

## 2011-03-27 NOTE — ED Notes (Signed)
EKG given to [place], [place].

## 2011-03-27 NOTE — ED Notes (Signed)
zofran 4mg  ivp, albuterol 5mg  neb

## 2011-03-27 NOTE — ED Provider Notes (Signed)
11:10 PM  Date: 03/27/2011  Rate:96  Rhythm: normal sinus rhythm  QRS Axis: normal  Intervals: QT prolonged  ST/T Wave abnormalities: normal  Conduction Disutrbances:none  Narrative Interpretation: Borderline EKG  Old EKG Reviewed: none available    Carleene Cooper III, MD 03/27/11 2311

## 2011-03-27 NOTE — ED Provider Notes (Signed)
History     CSN: [id number]  Arrival date & time 03/27/11  2201   First MD Initiated Contact with Patient 03/27/11 2305      Chief Complaint  Patient presents with  . Nausea    (Consider location/radiation/quality/duration/timing/severity/associated sxs/prior treatment) Patient is a 78 y.o. male presenting with vomiting. The history is provided by the patient. No language interpreter was used.  Emesis  This is a recurrent problem. The current episode started more than 1 week ago (3 weeks). The problem occurs 2 to 4 times per day. The problem has not changed since onset.The emesis has an appearance of stomach contents. There has been no fever. Associated symptoms include cough. Pertinent negatives include no abdominal pain, no arthralgias, no chills, no diarrhea, no fever, no headaches, no myalgias, no sweats and no URI. Risk factors: None.  Denies f/c/r. Denies CP, SOB DOE.    Past Medical History  Diagnosis Date  . COPD (chronic obstructive pulmonary disease)   . Chronic back pain   . Pneumonia     01/2011 - CAP vs aspiration pneuonmia  . Depression   . PONV (postoperative nausea and vomiting)   . Hyponatremia     Previously felt secondary to SIADH  . Hypertension   . Upper GI bleed     01/2011 with EGD showing severe candida esophagitis and duodenal bulb erosion  . Anemia     In the setting of UGI bleed 01/2011 requiring blood transfusion  . Homelessness     Past Surgical History  Procedure Date  . Tonsillectomy   . Vasectomy   . Appendectomy   . Tonsillectomy   . Colonoscopy   . Esophagogastroduodenoscopy   . Esophagogastroduodenoscopy 02/03/2011    Procedure: ESOPHAGOGASTRODUODENOSCOPY (EGD);  Surgeon: Charna Elizabeth, MD;  Location: [facility];  Service: Endoscopy;  Laterality: N/A;  . Esophagogastroduodenoscopy 02/03/2011    Procedure: ESOPHAGOGASTRODUODENOSCOPY (EGD);  Surgeon: Charna Elizabeth, MD;  Location: [facility];  Service: Endoscopy;  Laterality: N/A;     Family History  Problem Relation Age of Onset  . Coronary artery disease Father     Starting in his 40's. Died of massive MI at age 58.  . Schizophrenia Brother   . Coronary artery disease Sister     MI at age 28  . Alzheimer's disease Mother     History  Substance Use Topics  . Smoking status: Current Some Day Smoker -- 1.0 packs/day for 60 years    Types: Cigarettes  . Smokeless tobacco: Never Used   Comment: Since age 41  . Alcohol Use: Yes     174 ml of scotch weekly      Review of Systems  Constitutional: Negative for fever and chills.  HENT: Negative.   Eyes: Negative.   Respiratory: Positive for cough and wheezing.   Cardiovascular: Negative for chest pain.  Gastrointestinal: Positive for vomiting. Negative for abdominal pain and diarrhea.  Genitourinary: Negative.   Musculoskeletal: Negative for myalgias and arthralgias.  Skin: Negative.   Neurological: Negative for headaches.  Hematological: Negative.   Psychiatric/Behavioral: Negative.     Allergies  Penicillins  Home Medications   Current Outpatient Rx  Name Route Sig Dispense Refill  . FERROUS SULFATE 325 (65 FE) MG PO TABS Oral Take 1 tablet (325 mg total) by mouth 2 (two) times daily with a meal. 3 tablet 0  . LISINOPRIL 5 MG PO TABS Oral Take 1 tablet (5 mg total) by mouth daily. 3 tablet 0  . METOPROLOL SUCCINATE ER  25 MG PO TB24 Oral Take 1 tablet (25 mg total) by mouth daily. 3 tablet 0    BP 176/76  Pulse 96  Temp(Src) 98.1 F (36.7 C) (Oral)  Resp 18  SpO2 97%  Physical Exam  Constitutional: He is oriented to person, place, and time. He appears well-developed and well-nourished. No distress.  HENT:  Head: Normocephalic and atraumatic.  Mouth/Throat: Oropharynx is clear and moist.  Eyes: Conjunctivae are normal. Pupils are equal, round, and reactive to light.  Neck: Normal range of motion. Neck supple.  Cardiovascular: Normal rate and regular rhythm.   Pulmonary/Chest: Effort  normal. He has wheezes.  Abdominal: Soft. Bowel sounds are normal. There is no tenderness. There is no rebound and no guarding.  Musculoskeletal: Normal range of motion. He exhibits no edema.  Neurological: He is alert and oriented to person, place, and time. He has normal reflexes.  Skin: Skin is warm and dry.  Psychiatric: He has a normal mood and affect.    ED Course  Procedures (including critical care time)  Labs Reviewed  CBC - Abnormal; Notable for the following:    WBC 11.8 (*)    RBC 3.93 (*)    Hemoglobin 10.1 (*)    HCT 31.4 (*)    MCH 25.7 (*)    Platelets 471 (*)    All other components within normal limits  DIFFERENTIAL - Abnormal; Notable for the following:    Neutrophils Relative 81 (*)    Neutro Abs 9.5 (*)    All other components within normal limits  POCT I-STAT, CHEM 8 - Abnormal; Notable for the following:    Glucose, Bld 118 (*)    Hemoglobin 10.5 (*)    HCT 31.0 (*)    All other components within normal limits   Dg Chest [facility] 1 View  03/27/2011  *RADIOLOGY REPORT*  Clinical Data: Nausea.  PORTABLE CHEST - 1 VIEW  Comparison: 03/06/2011  Findings: Technically limited study due to patient rotation.  The heart size and pulmonary vascularity appear prominent suggesting mild vascular congestion.  The interstitial changes suggesting mild edema.  No focal airspace consolidation.  No blunting of costophrenic angles.  No evidence of pneumothorax.  Healing bilateral rib fractures.  Old ununited displaced left clavicular fracture.  IMPRESSION: Mild cardiac enlargement and pulmonary vascular congestion with slight interstitial edema.  Old post-traumatic changes.  Original Report Authenticated By: Marlon Pel, M.D.     No diagnosis found. Results for orders placed during the hospital encounter of 03/27/11  CBC      Component Value Range   WBC 11.8 (*) 4.0 - 10.5 (K/uL)   RBC 3.93 (*) 4.22 - 5.81 (MIL/uL)   Hemoglobin 10.1 (*) 13.0 - 17.0 (g/dL)   HCT 40.9  (*) 81.1 - 52.0 (%)   MCV 79.9  78.0 - 100.0 (fL)   MCH 25.7 (*) 26.0 - 34.0 (pg)   MCHC 32.2  30.0 - 36.0 (g/dL)   RDW 91.4  78.2 - 95.6 (%)   Platelets 471 (*) 150 - 400 (K/uL)  DIFFERENTIAL      Component Value Range   Neutrophils Relative 81 (*) 43 - 77 (%)   Neutro Abs 9.5 (*) 1.7 - 7.7 (K/uL)   Lymphocytes Relative 12  12 - 46 (%)   Lymphs Abs 1.4  0.7 - 4.0 (K/uL)   Monocytes Relative 5  3 - 12 (%)   Monocytes Absolute 0.6  0.1 - 1.0 (K/uL)   Eosinophils Relative 2  0 -  5 (%)   Eosinophils Absolute 0.3  0.0 - 0.7 (K/uL)   Basophils Relative 1  0 - 1 (%)   Basophils Absolute 0.1  0.0 - 0.1 (K/uL)  POCT I-STAT, CHEM 8      Component Value Range   Sodium 140  135 - 145 (mEq/L)   Potassium 3.5  3.5 - 5.1 (mEq/L)   Chloride 106  96 - 112 (mEq/L)   BUN 16  6 - 23 (mg/dL)   Creatinine, Ser 1.61  0.50 - 1.35 (mg/dL)   Glucose, Bld 096 (*) 70 - 99 (mg/dL)   Calcium, Ion 0.45  4.09 - 1.32 (mmol/L)   TCO2 25  0 - 100 (mmol/L)   Hemoglobin 10.5 (*) 13.0 - 17.0 (g/dL)   HCT 81.1 (*) 91.4 - 52.0 (%)  POCT I-STAT TROPONIN I      Component Value Range   Troponin i, poc 0.00  0.00 - 0.08 (ng/mL)   Comment 3           PRO B NATRIURETIC PEPTIDE      Component Value Range   Pro B Natriuretic peptide (BNP) 676.5 (*) 0 - 450 (pg/mL)   Dg Chest 2 View  03/06/2011  *RADIOLOGY REPORT*  Clinical Data: Syncope.  Shortness of breath.  CHEST - 2 VIEW  Comparison: 02/27/2011  Findings: Emphysema noted with multiple old bilateral rib fractures.  Callus formation associated with these rib fractures causes vague densities projecting over the lungs.  The patient is rotated to the right on today's exam, resulting in reduced diagnostic sensitivity and specificity.   Atherosclerotic calcification of the aortic arch is present.  Interstitial accentuation suggests mild underlying pulmonary fibrosis.  No pleural effusion or pulmonary edema.  Mild thoracic spondylosis is present.  Several indistinct cardiac  leads project over the thorax.  Bilateral nipple shadows noted.  IMPRESSION:  1.  Vague densities projecting over the lung fields appear to be attributable to callus formation from prior bilateral rib fractures, cardiac leads, and nipple shadows. 2.  Emphysema and interstitial accentuation suggesting mild fibrosis. 3.  Atherosclerosis. 4.  Mild thoracic spondylosis.  Original Report Authenticated By: Dellia Cloud, M.D.   Dg Chest 2 View  02/27/2011  *RADIOLOGY REPORT*  Clinical Data: Syncope, weakness, shortness of breath, smoker  CHEST - 2 VIEW  Comparison: 02/02/2011  Findings: Normal heart size, mediastinal contours, and pulmonary vascularity. Atherosclerotic calcification aorta. Peribronchial thickening. Perihilar infiltrates slightly greater on the right, question pulmonary edema/CHF. No pleural effusion or pneumothorax. Osseous demineralization. 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  IMPRESSION: Mild CHF.  Original Report Authenticated By: Lollie Marrow, M.D.   Ct Head Wo Contrast  02/27/2011  *RADIOLOGY REPORT*  Clinical Data: Dizziness and nausea since this morning.  Near- syncopal episode.  Possible trauma.  CT HEAD WITHOUT CONTRAST  Technique:  Contiguous axial images were obtained from the base of the skull through the vertex without contrast.  Comparison: 02/02/2011  Findings: Bone windows demonstrate clear paranasal sinuses and mastoid air cells.  Soft tissue windows demonstrate moderate low density in the periventricular white matter, likely related to small vessel disease. Expected cerebral atrophy. No  mass lesion, hemorrhage, hydrocephalus, acute infarct, intra-axial, or extra-axial fluid collection.  IMPRESSION:  1. No acute intracranial abnormality. 2. Cerebral atrophy and small vessel ischemic change.  Original Report Authenticated By: Consuello Bossier, M.D.   US Abdomen Complete  02/28/2011  *RADIOLOGY REPORT*  Clinical Data:  Right upper quadrant pain, pancreatitis  ABDOMINAL  ULTRASOUND  COMPLETE  Comparison:  None.  Findings:  Gallbladder:  There are multiple gallstones within the gallbladder lumen.  The largest measures approximately 8 mm.  There is no gallbladder wall thickening or pericholecystic fluid.  Negative sonographic Murphy's sign.  Common Bile Duct:  Within normal limits in caliber. Measures 4.6 mm.  Liver: No focal mass lesion identified.  Within normal limits in parenchymal echogenicity.  IVC:  Appears normal.  Pancreas:  No abnormality identified.  Spleen:  Within normal limits in size and echotexture.  Right kidney:  Normal in size and parenchymal echogenicity.  No evidence of mass or hydronephrosis.  Left kidney:  Normal in size and parenchymal echogenicity.  No evidence of mass or hydronephrosis.  Abdominal Aorta:  No aneurysm identified.  IMPRESSION: Cholelithiasis without sonographic evidence of cholecystitis.  Original Report Authenticated By: Brandon Melnick, M.D.   Nm Myocar Multi W/spect W/wall Motion / Ef  03/02/2011  *RADIOLOGY REPORT*  Clinical Data:  78 year old with chest pain and cardiomyopathy.  MYOCARDIAL IMAGING WITH SPECT (REST AND PHARMACOLOGIC-STRESS) GATED LEFT VENTRICULAR WALL MOTION STUDY LEFT VENTRICULAR EJECTION FRACTION  Technique:  Standard myocardial SPECT imaging was performed after resting intravenous injection of 10 mCi Tc-21m tetrofosmin. Subsequently, intravenous infusion of regadenoson was performed under the supervision of the Cardiology staff.  At peak effect of the drug, 30 mCi Tc-65m tetrofosmin was injected intravenously and standard myocardial SPECT  imaging was performed.  Quantitative gated imaging was also performed to evaluate left ventricular wall motion, and estimate left ventricular ejection fraction.  Comparison:  None.  Findings: There is decreased uptake along the inferior lateral wall on both rest and stress images consistent with an area of scarring. There is slightly decreased uptake along the anterior septal wall on  the stress images but this is probably artifactual rather than a true reversible defect. There is diffuse hypokinesia of the left ventricle with more pronounced hypokinesia along the septal and inferior walls.  The end-diastolic volume is 143 ml and end- systolic volume is 92 ml.  Calculated ejection fraction is 36%. There is gut artifact on the stress images.  IMPRESSION: There is no clear evidence for a reversible defect or pharmacologically induced ischemia.  The anterior septal wall is equivocal as described.  Diffuse hypokinesia, particularly along the inferior and septal walls.  Ejection fraction is 36%.  Possible scarring along the posterior lateral wall.  Original Report Authenticated By: Richarda Overlie, M.D.   Dg Chest [place] 1 View  03/27/2011  *RADIOLOGY REPORT*  Clinical Data: Nausea.  PORTABLE CHEST - 1 VIEW  Comparison: 03/06/2011  Findings: Technically limited study due to patient rotation.  The heart size and pulmonary vascularity appear prominent suggesting mild vascular congestion.  The interstitial changes suggesting mild edema.  No focal airspace consolidation.  No blunting of costophrenic angles.  No evidence of pneumothorax.  Healing bilateral rib fractures.  Old ununited displaced left clavicular fracture.  IMPRESSION: Mild cardiac enlargement and pulmonary vascular congestion with slight interstitial edema.  Old post-traumatic changes.  Original Report Authenticated By: Marlon Pel, M.D.      MDM  See EKG input by Dr. Ignacia Palma        Ronson Hagins Smitty Cords, MD 03/28/11 4540

## 2011-03-28 ENCOUNTER — Encounter (HOSPITAL_COMMUNITY): Payer: Self-pay | Admitting: Family Medicine

## 2011-03-28 DIAGNOSIS — R0602 Shortness of breath: Secondary | ICD-10-CM | POA: Diagnosis present

## 2011-03-28 DIAGNOSIS — J449 Chronic obstructive pulmonary disease, unspecified: Secondary | ICD-10-CM | POA: Diagnosis present

## 2011-03-28 DIAGNOSIS — I509 Heart failure, unspecified: Secondary | ICD-10-CM | POA: Diagnosis present

## 2011-03-28 DIAGNOSIS — Z720 Tobacco use: Secondary | ICD-10-CM | POA: Diagnosis present

## 2011-03-28 LAB — CBC
HCT: 34.7 % — ABNORMAL LOW (ref 39.0–52.0)
Hemoglobin: 10.9 g/dL — ABNORMAL LOW (ref 13.0–17.0)
MCH: 25.3 pg — ABNORMAL LOW (ref 26.0–34.0)
MCHC: 31.4 g/dL (ref 30.0–36.0)
RBC: 4.31 MIL/uL (ref 4.22–5.81)

## 2011-03-28 LAB — LIPID PANEL
LDL Cholesterol: 85 mg/dL (ref 0–99)
Total CHOL/HDL Ratio: 3 RATIO
VLDL: 13 mg/dL (ref 0–40)

## 2011-03-28 LAB — CARDIAC PANEL(CRET KIN+CKTOT+MB+TROPI)
CK, MB: 2.5 ng/mL (ref 0.3–4.0)
Relative Index: INVALID (ref 0.0–2.5)
Total CK: 44 U/L (ref 7–232)

## 2011-03-28 LAB — PRO B NATRIURETIC PEPTIDE: Pro B Natriuretic peptide (BNP): 676.5 pg/mL — ABNORMAL HIGH (ref 0–450)

## 2011-03-28 LAB — CREATININE, SERUM: GFR calc non Af Amer: 47 mL/min — ABNORMAL LOW (ref >90)

## 2011-03-28 LAB — GLUCOSE, CAPILLARY: Glucose-Capillary: 88 mg/dL (ref 70–99)

## 2011-03-28 MED ORDER — ENOXAPARIN SODIUM 40 MG/0.4ML ~~LOC~~ SOLN
40.0000 mg | SUBCUTANEOUS | Status: DC
  Administered 2011-03-28 – 2011-03-30 (×4): 40 mg via SUBCUTANEOUS
  Filled 2011-03-28 (×3): qty 0.4

## 2011-03-28 MED ORDER — METOPROLOL SUCCINATE ER 25 MG PO TB24
25.0000 mg | ORAL_TABLET | Freq: Every day | ORAL | Status: DC
  Administered 2011-03-28 – 2011-03-30 (×3): 25 mg via ORAL
  Filled 2011-03-28 (×3): qty 1

## 2011-03-28 MED ORDER — NICOTINE 14 MG/24HR TD PT24
14.0000 mg | MEDICATED_PATCH | Freq: Every day | TRANSDERMAL | Status: DC
  Administered 2011-03-29 – 2011-03-30 (×2): 14 mg via TRANSDERMAL
  Filled 2011-03-28 (×2): qty 1

## 2011-03-28 MED ORDER — LISINOPRIL 5 MG PO TABS
5.0000 mg | ORAL_TABLET | Freq: Every day | ORAL | Status: DC
  Administered 2011-03-28 – 2011-03-30 (×3): 5 mg via ORAL
  Filled 2011-03-28 (×3): qty 1

## 2011-03-28 MED ORDER — PREDNISONE 20 MG PO TABS: 20.0000 mg | ORAL_TABLET | Freq: Every day | ORAL | Status: DC

## 2011-03-28 MED ORDER — THIAMINE HCL 100 MG/ML IJ SOLN
100.0000 mg | Freq: Every day | INTRAMUSCULAR | Status: DC
  Filled 2011-03-28 (×2): qty 1

## 2011-03-28 MED ORDER — ASPIRIN 81 MG PO CHEW
CHEWABLE_TABLET | ORAL | Status: AC
  Filled 2011-03-28: qty 3

## 2011-03-28 MED ORDER — VITAMIN B-1 100 MG PO TABS
100.0000 mg | ORAL_TABLET | Freq: Every day | ORAL | Status: DC
  Administered 2011-03-28 – 2011-03-30 (×3): 100 mg via ORAL
  Filled 2011-03-28 (×3): qty 1

## 2011-03-28 MED ORDER — LORAZEPAM 2 MG/ML IJ SOLN: 1.0000 mg | Freq: Four times a day (QID) | INTRAMUSCULAR | Status: DC | PRN

## 2011-03-28 MED ORDER — AZITHROMYCIN 500 MG PO TABS
500.0000 mg | ORAL_TABLET | Freq: Every day | ORAL | Status: DC
  Administered 2011-03-28 – 2011-03-29 (×2): 500 mg via ORAL
  Filled 2011-03-28: qty 2
  Filled 2011-03-28: qty 1

## 2011-03-28 MED ORDER — LORAZEPAM 2 MG/ML IJ SOLN: 0.0000 mg | Freq: Two times a day (BID) | INTRAMUSCULAR | Status: DC

## 2011-03-28 MED ORDER — PREDNISONE 10 MG PO TABS: 10.0000 mg | ORAL_TABLET | Freq: Every day | ORAL | Status: DC

## 2011-03-28 MED ORDER — FOLIC ACID 1 MG PO TABS
1.0000 mg | ORAL_TABLET | Freq: Every day | ORAL | Status: DC
  Administered 2011-03-28 – 2011-03-30 (×3): 1 mg via ORAL
  Filled 2011-03-28 (×4): qty 1

## 2011-03-28 MED ORDER — FUROSEMIDE 10 MG/ML IJ SOLN
40.0000 mg | Freq: Two times a day (BID) | INTRAMUSCULAR | Status: DC
  Administered 2011-03-28 – 2011-03-30 (×5): 40 mg via INTRAVENOUS
  Filled 2011-03-28 (×6): qty 4

## 2011-03-28 MED ORDER — PREDNISONE 20 MG PO TABS: 30.0000 mg | ORAL_TABLET | Freq: Every day | ORAL | Status: DC

## 2011-03-28 MED ORDER — GI COCKTAIL ~~LOC~~
30.0000 mL | Freq: Once | ORAL | Status: AC
  Administered 2011-03-28: 30 mL via ORAL
  Filled 2011-03-28: qty 30

## 2011-03-28 MED ORDER — FERROUS SULFATE 325 (65 FE) MG PO TABS
325.0000 mg | ORAL_TABLET | Freq: Two times a day (BID) | ORAL | Status: DC
  Administered 2011-03-28 – 2011-03-30 (×5): 325 mg via ORAL
  Filled 2011-03-28 (×6): qty 1

## 2011-03-28 MED ORDER — ALBUTEROL SULFATE (5 MG/ML) 0.5% IN NEBU
2.5000 mg | INHALATION_SOLUTION | RESPIRATORY_TRACT | Status: DC | PRN
  Filled 2011-03-28 (×2): qty 0.5

## 2011-03-28 MED ORDER — LORAZEPAM 2 MG/ML IJ SOLN: 0.0000 mg | Freq: Four times a day (QID) | INTRAMUSCULAR | Status: AC

## 2011-03-28 MED ORDER — NITROGLYCERIN 0.4 MG SL SUBL
0.4000 mg | SUBLINGUAL_TABLET | SUBLINGUAL | Status: DC | PRN
Start: 2011-03-28 — End: 2011-03-30

## 2011-03-28 MED ORDER — PREDNISONE 50 MG PO TABS
50.0000 mg | ORAL_TABLET | Freq: Every day | ORAL | Status: AC
  Administered 2011-03-29: 50 mg via ORAL
  Filled 2011-03-28 (×2): qty 1

## 2011-03-28 MED ORDER — LORAZEPAM 1 MG PO TABS
1.0000 mg | ORAL_TABLET | Freq: Four times a day (QID) | ORAL | Status: DC | PRN
  Administered 2011-03-28 – 2011-03-29 (×2): 1 mg via ORAL
  Filled 2011-03-28 (×2): qty 1

## 2011-03-28 MED ORDER — ASPIRIN 325 MG PO TABS
325.0000 mg | ORAL_TABLET | Freq: Every day | ORAL | Status: DC
  Administered 2011-03-28 – 2011-03-30 (×3): 325 mg via ORAL
  Filled 2011-03-28 (×3): qty 1

## 2011-03-28 MED ORDER — PREDNISONE 20 MG PO TABS
40.0000 mg | ORAL_TABLET | Freq: Every day | ORAL | Status: AC
  Administered 2011-03-30: 40 mg via ORAL
  Filled 2011-03-28: qty 2

## 2011-03-28 MED ORDER — ADULT MULTIVITAMIN W/MINERALS CH
1.0000 | ORAL_TABLET | Freq: Every day | ORAL | Status: DC
  Administered 2011-03-28 – 2011-03-30 (×3): 1 via ORAL
  Filled 2011-03-28 (×2): qty 1

## 2011-03-28 MED ORDER — FUROSEMIDE 10 MG/ML IJ SOLN
40.0000 mg | Freq: Once | INTRAMUSCULAR | Status: AC
  Administered 2011-03-28: 40 mg via INTRAVENOUS
  Filled 2011-03-28: qty 4

## 2011-03-28 MED ORDER — ASPIRIN 81 MG PO CHEW
324.0000 mg | CHEWABLE_TABLET | Freq: Once | ORAL | Status: AC
  Administered 2011-03-28: 324 mg via ORAL
  Filled 2011-03-28: qty 1

## 2011-03-28 MED ORDER — ALBUTEROL SULFATE (5 MG/ML) 0.5% IN NEBU
2.5000 mg | INHALATION_SOLUTION | Freq: Four times a day (QID) | RESPIRATORY_TRACT | Status: DC
  Administered 2011-03-28 – 2011-03-30 (×7): 2.5 mg via RESPIRATORY_TRACT
  Filled 2011-03-28 (×4): qty 0.5

## 2011-03-28 NOTE — ED Notes (Signed)
Patient states he wants to speak to the doctor and that he was not going to take anything else till he gets food and drink.  Informed md.

## 2011-03-28 NOTE — ED Notes (Signed)
Cleaned patient up and changed bed.  Patient was saturated in urine and refused to use urinal and stated he wanted to pee on staff

## 2011-03-28 NOTE — H&P (Signed)
PCP:   No primary provider on file.   Chief Complaint:  Shortness of breath  HPI: This is a 78 year old gentleman who is homeless. Today he developed nausea vomiting, no hematemesis. He had no diarrhea. He does have chronic shortness of breath however he states is been worse the past few days. He states he's not normally able to go more than a few feet without getting short of breath -this is chronic. He does report some wheezing. He did report some chest pains today. He states he was across his chest. The chest pain occurred and resolved and with his retching. He states he does not have regular have chest pains. He does have a chronic cough. He reports no lower certainty edema, no palpitations. He states the chest pain as sharp, with no radiation. Patient is a poor to fair historian, his history is quite tangential. His nausea and vomiting has resolved he reports no diarrhea.  Review of Systems:  anorexia, fever, weight loss,, vision loss, decreased hearing, hoarseness, chest pain, syncope, dyspnea on exertion, peripheral edema, balance deficits, hemoptysis, abdominal pain, melena, hematochezia, severe indigestion/heartburn, hematuria, incontinence, genital sores, muscle weakness, suspicious skin lesions, transient blindness, difficulty walking, depression, unusual weight change, abnormal bleeding, enlarged lymph nodes, angioedema, and breast masses.  Past Medical History: Past Medical History  Diagnosis Date  . COPD (chronic obstructive pulmonary disease)   . Chronic back pain   . Pneumonia     01/2011 - CAP vs aspiration pneuonmia  . Depression   . PONV (postoperative nausea and vomiting)   . Hyponatremia     Previously felt secondary to SIADH  . Hypertension   . Upper GI bleed     01/2011 with EGD showing severe candida esophagitis and duodenal bulb erosion  . Anemia     In the setting of UGI bleed 01/2011 requiring blood transfusion  . Homelessness    Past Surgical History   Procedure Date  . Tonsillectomy   . Vasectomy   . Appendectomy   . Tonsillectomy   . Colonoscopy   . Esophagogastroduodenoscopy   . Esophagogastroduodenoscopy 02/03/2011    Procedure: ESOPHAGOGASTRODUODENOSCOPY (EGD);  Surgeon: Charna Elizabeth, MD;  Location: [facility];  Service: Endoscopy;  Laterality: N/A;  . Esophagogastroduodenoscopy 02/03/2011    Procedure: ESOPHAGOGASTRODUODENOSCOPY (EGD);  Surgeon: Charna Elizabeth, MD;  Location: [facility];  Service: Endoscopy;  Laterality: N/A;    Medications: Prior to Admission medications   Medication Sig Start Date End Date Taking? Authorizing Provider  ferrous sulfate 325 (65 FE) MG tablet Take 1 tablet (325 mg total) by mouth 2 (two) times daily with a meal. 03/05/11 03/04/12  Belkys Regalado, MD  lisinopril (PRINIVIL,ZESTRIL) 5 MG tablet Take 1 tablet (5 mg total) by mouth daily. 03/05/11 03/04/12  Hartley Barefoot, MD  metoprolol succinate (TOPROL-XL) 25 MG 24 hr tablet Take 1 tablet (25 mg total) by mouth daily. 03/05/11 03/04/12  Hartley Barefoot, MD    Allergies:   Allergies  Allergen Reactions  . Penicillins Itching    Social History:  reports that he has been smoking Cigarettes.  He has a 60 pack-year smoking history. He has never used smokeless tobacco. He reports that he drinks alcohol. He reports that he does not use illicit drugs.  Family History: Family History  Problem Relation Age of Onset  . Coronary artery disease Father     Starting in his 53's. Died of massive MI at age 61.  . Schizophrenia Brother   . Coronary artery disease Sister  MI at age 69  . Alzheimer's disease Mother     Physical Exam: Filed Vitals:   03/27/11 2202 03/27/11 2350  BP: 176/76   Pulse: 96   Temp: 98.1 F (36.7 C)   TempSrc: Oral   Resp: 18   SpO2: 96% 97%    General:  Alert and oriented times three, disheveled to foul-smelling, no acute distress Eyes: PERRLA, pink conjunctiva, no scleral icterus ENT: Moist oral mucosa, neck  supple, no thyromegaly, small ulceration at tip of nares Lungs: clear to ascultation, no wheeze, no crackles, no use of accessory muscles Cardiovascular: regular rate and rhythm, no regurgitation, no gallops, no murmurs. No carotid bruits, no JVD Abdomen: soft, positive BS, non-tender, non-distended, no organomegaly, not an acute abdomen GU: not examined Neuro: CN II - XII grossly intact, sensation intact Musculoskeletal: strength 5/5 all extremities, no clubbing, cyanosis or edema Skin: no rash, no subcutaneous crepitation, no decubitus    Labs on Admission:   [facility] 03/27/11 2346  NA 140  K 3.5  CL 106  CO2 --  GLUCOSE 118*  BUN 16  CREATININE 0.80  CALCIUM --  MG --  PHOS --  Results for DERICO, MITTON (MRN [id number]) as of 03/28/2011 02:58  Ref. Range 03/28/2011 00:19  Pro B Natriuretic peptide (BNP) Latest Range: 0-450 pg/mL 676.5 (H)  Results for BUZZ, AXEL (MRN [id number]) as of 03/28/2011 02:58  Ref. Range 03/27/2011 23:44  Troponin i, poc Latest Range: 0.00-0.08 ng/mL 0.00   No results found for this basename: AST:2,ALT:2,ALKPHOS:2,BILITOT:2,PROT:2,ALBUMIN:2 in the last 72 hours No results found for this basename: LIPASE:2,AMYLASE:2 in the last 72 hours  Basename 03/27/11 2346 03/27/11 2341  WBC -- 11.8*  NEUTROABS -- 9.5*  HGB 10.5* 10.1*  HCT 31.0* 31.4*  MCV -- 79.9  PLT -- 471*   No results found for this basename: CKTOTAL:3,CKMB:3,CKMBINDEX:3,TROPONINI:3 in the last 72 hours No components found with this basename: POCBNP:3 No results found for this basename: DDIMER:2 in the last 72 hours No results found for this basename: HGBA1C:2 in the last 72 hours No results found for this basename: CHOL:2,HDL:2,LDLCALC:2,TRIG:2,CHOLHDL:2,LDLDIRECT:2 in the last 72 hours No results found for this basename: TSH,T4TOTAL,FREET3,T3FREE,THYROIDAB in the last 72 hours No results found for this basename: VITAMINB12:2,FOLATE:2,FERRITIN:2,TIBC:2,IRON:2,RETICCTPCT:2 in the last  72 hours  Micro Results: No results found for this or any previous visit (from the past 240 hour(s)).   Radiological Exams on Admission: Dg Chest Port 1 View  03/27/2011  *RADIOLOGY REPORT*  Clinical Data: Nausea.  PORTABLE CHEST - 1 VIEW  Comparison: 03/06/2011  Findings: Technically limited study due to patient rotation.  The heart size and pulmonary vascularity appear prominent suggesting mild vascular congestion.  The interstitial changes suggesting mild edema.  No focal airspace consolidation.  No blunting of costophrenic angles.  No evidence of pneumothorax.  Healing bilateral rib fractures.  Old ununited displaced left clavicular fracture.  IMPRESSION: Mild cardiac enlargement and pulmonary vascular congestion with slight interstitial edema.  Old post-traumatic changes.  Original Report Authenticated By: Marlon Pel, M.D.   EKG normal sinus rhythm  Assessment/Plan Present on Admission:  .CHF (congestive heart failure) New diagnosis Admit to telemetry Lasix and 2-D echo ordered Patient on ACE inhibitor Willl cycle cardiac enzymes, check lipid panel COPD Tobacco abuse Stable will order nebulizers and nicotine patch ?alcohol abuse Patient denies however weill order CIWA protocol  Full code DVT prophylaxis Team 1/Dr Domingo Madeira, Shelley Pooley 03/28/2011, 2:58 AM

## 2011-03-28 NOTE — ED Notes (Signed)
CBG 88 

## 2011-03-28 NOTE — Progress Notes (Signed)
Patient ID: Roselyn Reef, male   DOB: 1929/05/01, 78 y.o.   MRN: [id number]  Subjective: No events overnight. Patient denies chest pain, shortness of breath, abdominal pain. He reports persistent productive cough of yellowish to greenish sputum. Objective:  Vital signs in last 24 hours:  Filed Vitals:   03/27/11 2202 03/27/11 2350 03/28/11 0445 03/28/11 0801  BP: 176/76  164/85 119/70  Pulse: 96  91 93  Temp: 98.1 F (36.7 C)   98.4 F (36.9 C)  TempSrc: Oral   Oral  Resp: 18  19 18   SpO2: 96% 97% 97% 96%    Intake/Output from previous day:   Intake/Output Summary (Last 24 hours) at 03/28/11 1112 Last data filed at 03/28/11 0801  Gross per 24 hour  Intake      0 ml  Output    300 ml  Net   -300 ml    Physical Exam: General: Alert, awake, oriented x3, in no acute distress. Disheveled looking. HEENT: No bruits, no goiter. Moist mucous membranes, no scleral icterus, no conjunctival pallor. Heart: Regular rate and rhythm, S1/S2 +, no murmurs, rubs, gallops. Lungs: Bilateral crackles with expiratory wheezing, no rhonchi, no rales.  Abdomen: Soft, nontender, nondistended, positive bowel sounds. Extremities: No clubbing or cyanosis, no pitting edema,  positive pedal pulses. Neuro: Grossly nonfocal.  Lab Results:  Basic Metabolic Panel:    Component Value Date/Time   NA 140 03/27/2011 2346   K 3.5 03/27/2011 2346   CL 106 03/27/2011 2346   CO2 23 03/06/2011 1444   BUN 16 03/27/2011 2346   CREATININE 0.80 03/27/2011 2346   GLUCOSE 118* 03/27/2011 2346   CALCIUM 9.2 03/06/2011 1444   CBC:    Component Value Date/Time   WBC 11.8* 03/27/2011 2341   HGB 10.5* 03/27/2011 2346   HCT 31.0* 03/27/2011 2346   PLT 471* 03/27/2011 2341   MCV 79.9 03/27/2011 2341   NEUTROABS 9.5* 03/27/2011 2341   LYMPHSABS 1.4 03/27/2011 2341   MONOABS 0.6 03/27/2011 2341   EOSABS 0.3 03/27/2011 2341   BASOSABS 0.1 03/27/2011 2341      Lab 03/27/11 2346 03/27/11 2341  WBC -- 11.8*  HGB 10.5* 10.1*    HCT 31.0* 31.4*  PLT -- 471*  MCV -- 79.9  MCH -- 25.7*  MCHC -- 32.2  RDW -- 15.3  LYMPHSABS -- 1.4  MONOABS -- 0.6  EOSABS -- 0.3  BASOSABS -- 0.1  BANDABS -- --    Lab 03/27/11 2346  NA 140  K 3.5  CL 106  CO2 --  GLUCOSE 118*  BUN 16  CREATININE 0.80  CALCIUM --  MG --   No results found for this basename: INR:5,PROTIME:5 in the last 168 hours Cardiac markers:  Lab 03/28/11 0553  CKMB --  TROPONINI <0.30  MYOGLOBIN --   Studies/Results: Dg Chest Port 1 View 03/27/2011   IMPRESSION: Mild cardiac enlargement and pulmonary vascular congestion with slight interstitial edema.  Old post-traumatic changes.    Medications: Scheduled Meds:   . albuterol  2.5 mg Nebulization Once  . albuterol      . aspirin  324 mg Oral Once  . aspirin  325 mg Oral Daily  . enoxaparin  40 mg Subcutaneous Q24H  . ferrous sulfate  325 mg Oral BID WC  . folic acid  1 mg Oral Daily  . furosemide  40 mg Intravenous Once  . furosemide  40 mg Intravenous BID  . gi cocktail  30 mL Oral Once  .  ipratropium  0.5 mg Nebulization Once  . lisinopril  5 mg Oral Daily  . LORazepam  0-4 mg Intravenous Q6H   Followed by  . LORazepam  0-4 mg Intravenous Q12H  . metoprolol succinate  25 mg Oral Daily  . mulitivitamin with minerals  1 tablet Oral Daily  . nicotine  14 mg Transdermal Daily  . ondansetron      . thiamine  100 mg Oral Daily   Or  . thiamine  100 mg Intravenous Daily   Continuous Infusions:  PRN Meds:.LORazepam, LORazepam, nitroGLYCERIN  Assessment/Plan:  Principal Problem:  *Shortness of breath - likely multifactorial in nature: CHF exacerbation with COPD flare - will admit to telemetry, monitor vitals per floor protocol - continue to cycle CE's, obtain TSH - continue Lasix and monitor I's and O's - d/c all IVF - continue nebulizres as noted above  Active Problems:  CHF (congestive heart failure) - crackles still noted on physical exam - will d/c IVF and continue  Lasix - follow up on BMP - follow I's and O's and vitals per floor protocol - readjust the dose in AM based on physical exam and laboratory findings    Leukocytosis - given subjective fevers, productive cough, leukocytosis, will start empiric treatment with zithromax - no follow up CBC this AM to assess WBC - obtain CBC in AM   COPD (chronic obstructive pulmonary disease) - continue nebulizers PRN and scheduled - wheezing is still present so will start short course prednisone taper - will treat empirical with Zithromax   Anemia due to chronic illness - Hg/Hct remain stable and at pt's baseline   Tobacco abuse - consult for cessation provided by me   EDUCATION - test results and diagnostic studies were discussed with patient  - patient verbalized the understanding - questions were answered at the bedside and contact information was provided for additional questions or concerns   LOS: 1 day   MAGICK-Carmencita Cusic 03/28/2011, 11:12 AM  [facility] Pager: [phone]

## 2011-03-29 DIAGNOSIS — I509 Heart failure, unspecified: Secondary | ICD-10-CM

## 2011-03-29 LAB — CBC
Hemoglobin: 10.2 g/dL — ABNORMAL LOW (ref 13.0–17.0)
MCH: 25.6 pg — ABNORMAL LOW (ref 26.0–34.0)
RBC: 3.99 MIL/uL — ABNORMAL LOW (ref 4.22–5.81)
WBC: 13.1 10*3/uL — ABNORMAL HIGH (ref 4.0–10.5)

## 2011-03-29 LAB — BASIC METABOLIC PANEL
CO2: 25 mEq/L (ref 19–32)
Chloride: 95 mEq/L — ABNORMAL LOW (ref 96–112)
Creatinine, Ser: 1.39 mg/dL — ABNORMAL HIGH (ref 0.50–1.35)
GFR calc Af Amer: 54 mL/min — ABNORMAL LOW (ref >90)
Potassium: 4.3 mEq/L (ref 3.5–5.1)
Sodium: 132 mEq/L — ABNORMAL LOW (ref 135–145)

## 2011-03-29 LAB — CARDIAC PANEL(CRET KIN+CKTOT+MB+TROPI): Relative Index: INVALID (ref 0.0–2.5)

## 2011-03-29 LAB — PRO B NATRIURETIC PEPTIDE: Pro B Natriuretic peptide (BNP): 373.2 pg/mL (ref 0–450)

## 2011-03-29 MED ORDER — DOXYCYCLINE HYCLATE 100 MG PO TABS
100.0000 mg | ORAL_TABLET | Freq: Two times a day (BID) | ORAL | Status: DC
  Administered 2011-03-29 – 2011-03-30 (×2): 100 mg via ORAL
  Filled 2011-03-29 (×4): qty 1

## 2011-03-29 MED ORDER — MOXIFLOXACIN HCL IN NACL 400 MG/250ML IV SOLN
400.0000 mg | INTRAVENOUS | Status: DC
  Filled 2011-03-29: qty 250

## 2011-03-29 NOTE — Progress Notes (Signed)
Dale Mcfarland  XBJ:[id number]  DOB: 11/18/1929  DOA: 03/27/2011  PCP: No primary provider on file.  Subjective: Denies any chest pain, reports shortness of breath and cough is improving.  Objective: Weight change:  No intake or output data in the 24 hours ending 03/29/11 1433 Blood pressure 109/64, pulse 93, temperature 97.8 F (36.6 C), temperature source Oral, resp. rate 18, height 5\' 7"  (1.702 m), weight 60.2 kg (132 lb 11.5 oz), SpO2 93.00%.  Physical Exam: General: Alert and awake, oriented x3, not in any acute distress. HEENT: anicteric sclera, pupils reactive to light and accommodation, EOMI CVS: S1-S2 clear, no murmur rubs or gallops Chest: Bilateral wheezing with bibasilar crackles Abdomen: soft nontender, nondistended, normal bowel sounds, no organomegaly Extremities: no cyanosis, clubbing or edema noted bilaterally Neuro: Cranial nerves II-XII intact, no focal neurological deficits  Lab Results: Basic Metabolic Panel:  Lab 03/29/11 3086 03/28/11 1955 03/27/11 2346  NA 132* -- 140  K 4.3 -- 3.5  CL 95* -- 106  CO2 25 -- --  GLUCOSE 137* -- 118*  BUN 29* -- 16  CREATININE 1.39* 1.38* --  CALCIUM 9.5 -- --  MG -- -- --  PHOS -- -- --   Liver Function Tests: No results found for this basename: AST:2,ALT:2,ALKPHOS:2,BILITOT:2,PROT:2,ALBUMIN:2 in the last 168 hours No results found for this basename: LIPASE:2,AMYLASE:2 in the last 168 hours No results found for this basename: AMMONIA:2 in the last 168 hours CBC:  Lab 03/29/11 0301 03/28/11 1955 03/27/11 2341  WBC 13.1* 11.6* --  NEUTROABS -- -- 9.5*  HGB 10.2* 10.9* --  HCT 32.0* 34.7* --  MCV 80.2 80.5 --  PLT 546* 591* --   Cardiac Enzymes:  Lab 03/29/11 0301 03/28/11 1955 03/28/11 1140  CKTOTAL 42 50 44  CKMB 2.2 2.5 2.5  CKMBINDEX -- -- --  TROPONINI <0.30 <0.30 <0.30   CBG:  Lab 03/28/11 0800  GLUCAP 88     Micro Results: No results found for this or any previous visit (from the past 240  hour(s)).  Studies/Results: Dg Chest 2 View  03/06/2011  *RADIOLOGY REPORT*  Clinical Data: Syncope.  Shortness of breath.  CHEST - 2 VIEW  Comparison: 02/27/2011    IMPRESSION:  1.  Vague densities projecting over the lung fields appear to be attributable to callus formation from prior bilateral rib fractures, cardiac leads, and nipple shadows. 2.  Emphysema and interstitial accentuation suggesting mild fibrosis. 3.  Atherosclerosis. 4.  Mild thoracic spondylosis.  Original Report Authenticated By: Dellia Cloud, M.D.   US Abdomen Complete  02/28/2011  *RADIOLOGY REPORT*  Clinical Data:  Right upper quadrant pain, pancreatitis  ABDOMINAL ULTRASOUND COMPLETE   IMPRESSION: Cholelithiasis without sonographic evidence of cholecystitis.  Original Report Authenticated By: Brandon Melnick, M.D.   Nm Myocar Multi W/spect W/wall Motion / Ef  03/02/2011  *RADIOLOGY REPORT*  Clinical Data:  78 year old with chest pain and cardiomyopathy.  MYOCARDIAL IMAGING WITH SPECT (REST AND PHARMACOLOGIC-STRESS) GATED LEFT VENTRICULAR WALL MOTION STUDY LEFT VENTRICULAR EJECTION FRACTION    IMPRESSION: There is no clear evidence for a reversible defect or pharmacologically induced ischemia.  The anterior septal wall is equivocal as described.  Diffuse hypokinesia, particularly along the inferior and septal walls.  Ejection fraction is 36%.  Possible scarring along the posterior lateral wall.  Original Report Authenticated By: Richarda Overlie, M.D.   Dg Chest Port 1 View  03/27/2011  *RADIOLOGY REPORT*  Clinical Data: Nausea.  PORTABLE CHEST - 1 VIEW  Compari IMPRESSION: Mild  cardiac enlargement and pulmonary vascular congestion with slight interstitial edema.  Old post-traumatic changes.  Original Report Authenticated By: Marlon Pel, M.D.   2-D echo on 02/28/2011 Study Conclusions  - Left ventricle: The cavity size was normal. There was moderate concentric hypertrophy. Systolic function was moderately to  severely reduced. The estimated ejection fraction was in the range of 30% to 35%. Diffuse hypokinesis. - Aortic root: The aortic root was mildly dilated. - Left atrium: The atrium was moderately dilated.     Medications: Scheduled Meds: : I have reviewed although scheduled medications  . albuterol  2.5 mg Nebulization Q6H  . aspirin  325 mg Oral Daily  . azithromycin  500 mg Oral Daily  . enoxaparin  40 mg Subcutaneous Q24H  . ferrous sulfate  325 mg Oral BID WC  . folic acid  1 mg Oral Daily  . furosemide  40 mg Intravenous BID  . lisinopril  5 mg Oral Daily  . LORazepam  0-4 mg Intravenous Q6H   Followed by  . LORazepam  0-4 mg Intravenous Q12H  . metoprolol succinate  25 mg Oral Daily  . mulitivitamin with minerals  1 tablet Oral Daily  . nicotine  14 mg Transdermal Daily  . predniSONE  40 mg Oral Q breakfast   Followed by  . predniSONE  30 mg Oral Q breakfast   Followed by  . predniSONE  20 mg Oral Q breakfast   Followed by  . predniSONE  10 mg Oral Q breakfast  . predniSONE  50 mg Oral Q breakfast  . thiamine  100 mg Oral Daily   Or  . thiamine  100 mg Intravenous Daily   Assessment/Plan:  Principal Problem:  *Shortness of breath: multifactorial, with CHF exacerbation and COPD flare  - Cardiac enzymes negative for acute ACS, TSH pending - continue Lasix and monitor I's and O's  - continue nebulizres as noted above   Active Problems:  CHF (congestive heart failure) : Acute on chronic systolic CHF, EF of 30-35% on echo done in January 2013 - continue Lasix. Patient actually had a stress test done as well on 03/02/2011, EF 36%, no reversible ischemia  - Continue I's and O.'s and daily weights. On Lasix, beta blocker, not on ACEI or ARB due to acute renal insufficiency  - No need for repeat echocardiogram as it was done last month, however I did call for cardiology consult for further recommendations.   Leukocytosis  - given subjective fevers, productive cough,  leukocytosis, likely now up due to steroids - will place on doxycycline (has prolonged QTC on EKG, avoid fluoroquinolones)  COPD (chronic obstructive pulmonary disease)  - continue nebulizers PRN and scheduled, steroids, doxycycline   Anemia due to chronic illness  - Hg/Hct remain stable and at pt's baseline   Tobacco abuse  - Nicotine cessation counseling provided, continue Nicoderm patch   DVT Prophylaxis: Lovenox  Code Status: Full code  Disposition: Hopefully in 1-2 days   LOS: 2 days   Andreu Drudge M.D. [facility] 03/29/2011, 2:33 PM

## 2011-03-29 NOTE — Clinical Documentation Improvement (Signed)
CHF DOCUMENTATION CLARIFICATION QUERY  THIS DOCUMENT IS NOT A PERMANENT PART OF THE MEDICAL RECORD  TO RESPOND TO THE THIS QUERY, FOLLOW THE INSTRUCTIONS BELOW:  1. If needed, update documentation for the patient's encounter via the notes activity.  2. Access this query again and click edit on the In [employer].  3. After updating, or not, click F2 to complete all highlighted (required) fields concerning your review. Select "additional documentation in the medical record" OR "no additional documentation provided".  4. Click Sign note button.  5. The deficiency will fall out of your In Basket *Please let us know if you are not able to complete this workflow by phone or e-mail (listed below).  Please update your documentation within the medical record to reflect your response to this query.                                                                                    03/29/11  Dear [facility],  In a better effort to capture your patient's severity of illness, reflect appropriate length of stay and utilization of resources, a review of the patient medical record has revealed the following indicators the diagnosis of Heart Failure.    Please consider the below (if your clinical findings/judgment agree) as you document the patient's condition(s) in the progress note and discharge summary. Thank you!  Based on your clinical judgment, please clarify and document in a progress note and/or discharge summary the clinical condition associated with the following supporting information:   Possible Clinical Conditions?  Chronic Systolic Congestive Heart Failure Chronic Diastolic Congestive Heart Failure Chronic Systolic & Diastolic Congestive Heart Failure Acute Systolic Congestive Heart Failure Acute Diastolic Congestive Heart Failure Acute Systolic & Diastolic Congestive Heart Failure Acute on Chronic Systolic Congestive Heart Failure Acute on Chronic Diastolic Congestive  Heart Failure Acute on Chronic Systolic & Diastolic  Congestive Heart Failure Other Condition________________________________________ Cannot Clinically Determine  Supporting Information:   Signs & Symptoms: (AS PER NOTES) "CHF exacerbation" & "CHF (congestive heart failure)" - crackles still noted on physical exam - will d/c IVF and continue Lasix - follow up on BMP - follow I's and O's and vitals per floor protocol - readjust the dose in AM based on physical exam and laboratory findings        Reviewed: additional documentation in the medical record  Thank Dorann Ou Delk RN,BSN  Clinical Documentation Specialist: [phone] Pager Health Information Management [place]

## 2011-03-29 NOTE — Progress Notes (Signed)
*  PRELIMINARY RESULTS* Echocardiogram 2D Echocardiogram has been performed.  Glean Salen [facility] 03/29/2011, 4:25 PM

## 2011-03-29 NOTE — Consult Note (Signed)
Reason for Consult: Decompensated systolic heart failure Referring Physician: [facility]  Dale Mcfarland is an 78 y.o. male.  HPI: Patient is 78 year old male with past medical history significant for hypertension COPD tobacco abuse alcohol abuse history of GI bleeding in the past anemia questionable history of MI in the past patient has very poor historian strong family history of coronary artery disease was admitted because of her aggressive increasing shortness of breath associated with the chest pain for last few days off and on and was noted to be in mild decompensated systolic heart failure. Her patient was recently discharged from the hospital had the 2-D echo and nuclear stress test in January of 2013 which showed the posterolateral wall scarring with EF of 636% with no evidence of reversible ischemia. 2-D echo also showed a EF of approximately 30-35%. Patient very noncompliant to medication and is homeless. Patient also reports a long history of drinking . Patient denies any PND orthopnea leg swelling patient denies any palpitation lightheadedness. Patient denies any invasive cardiac workup in the past. Patient denies presently any chest pain shortness of breath states he is feeling fine  Past Medical History  Diagnosis Date  . COPD (chronic obstructive pulmonary disease)   . Chronic back pain   . Pneumonia     01/2011 - CAP vs aspiration pneuonmia  . Depression   . PONV (postoperative nausea and vomiting)   . Hyponatremia     Previously felt secondary to SIADH  . Hypertension   . Upper GI bleed     01/2011 with EGD showing severe candida esophagitis and duodenal bulb erosion  . Anemia     In the setting of UGI bleed 01/2011 requiring blood transfusion  . Homelessness     Past Surgical History  Procedure Date  . Tonsillectomy   . Vasectomy   . Appendectomy   . Tonsillectomy   . Colonoscopy   . Esophagogastroduodenoscopy   . Esophagogastroduodenoscopy 02/03/2011   Procedure: ESOPHAGOGASTRODUODENOSCOPY (EGD);  Surgeon: Charna Elizabeth, MD;  Location: [facility];  Service: Endoscopy;  Laterality: N/A;  . Esophagogastroduodenoscopy 02/03/2011    Procedure: ESOPHAGOGASTRODUODENOSCOPY (EGD);  Surgeon: Charna Elizabeth, MD;  Location: [facility];  Service: Endoscopy;  Laterality: N/A;    Family History  Problem Relation Age of Onset  . Coronary artery disease Father     Starting in his 45's. Died of massive MI at age 59.  . Schizophrenia Brother   . Coronary artery disease Sister     MI at age 52  . Alzheimer's disease Mother     Social History:  reports that he has been smoking Cigarettes.  He has a 60 pack-year smoking history. He has never used smokeless tobacco. He reports that he drinks alcohol. He reports that he does not use illicit drugs.  Allergies:  Allergies  Allergen Reactions  . Penicillins Itching    Medications: I have reviewed the patient's current medications.  Results for orders placed during the hospital encounter of 03/27/11 (from the past 48 hour(s))  CBC     Status: Abnormal   Collection Time   03/27/11 11:41 PM      Component Value Range Comment   WBC 11.8 (*) 4.0 - 10.5 (K/uL)    RBC 3.93 (*) 4.22 - 5.81 (MIL/uL)    Hemoglobin 10.1 (*) 13.0 - 17.0 (g/dL)    HCT 16.1 (*) 09.6 - 52.0 (%)    MCV 79.9  78.0 - 100.0 (fL)    MCH 25.7 (*) 26.0 - 34.0 (  pg)    MCHC 32.2  30.0 - 36.0 (g/dL)    RDW 40.9  81.1 - 91.4 (%)    Platelets 471 (*) 150 - 400 (K/uL)   DIFFERENTIAL     Status: Abnormal   Collection Time   03/27/11 11:41 PM      Component Value Range Comment   Neutrophils Relative 81 (*) 43 - 77 (%)    Neutro Abs 9.5 (*) 1.7 - 7.7 (K/uL)    Lymphocytes Relative 12  12 - 46 (%)    Lymphs Abs 1.4  0.7 - 4.0 (K/uL)    Monocytes Relative 5  3 - 12 (%)    Monocytes Absolute 0.6  0.1 - 1.0 (K/uL)    Eosinophils Relative 2  0 - 5 (%)    Eosinophils Absolute 0.3  0.0 - 0.7 (K/uL)    Basophils Relative 1  0 - 1 (%)    Basophils  Absolute 0.1  0.0 - 0.1 (K/uL)   POCT I-STAT TROPONIN I     Status: Normal   Collection Time   03/27/11 11:44 PM      Component Value Range Comment   Troponin i, poc 0.00  0.00 - 0.08 (ng/mL)    Comment 3            POCT I-STAT, CHEM 8     Status: Abnormal   Collection Time   03/27/11 11:46 PM      Component Value Range Comment   Sodium 140  135 - 145 (mEq/L)    Potassium 3.5  3.5 - 5.1 (mEq/L)    Chloride 106  96 - 112 (mEq/L)    BUN 16  6 - 23 (mg/dL)    Creatinine, Ser 7.82  0.50 - 1.35 (mg/dL)    Glucose, Bld 956 (*) 70 - 99 (mg/dL)    Calcium, Ion 2.13  1.12 - 1.32 (mmol/L)    TCO2 25  0 - 100 (mmol/L)    Hemoglobin 10.5 (*) 13.0 - 17.0 (g/dL)    HCT 08.6 (*) 57.8 - 52.0 (%)   PRO B NATRIURETIC PEPTIDE     Status: Abnormal   Collection Time   03/28/11 12:19 AM      Component Value Range Comment   Pro B Natriuretic peptide (BNP) 676.5 (*) 0 - 450 (pg/mL)   TROPONIN I     Status: Normal   Collection Time   03/28/11  5:53 AM      Component Value Range Comment   Troponin I <0.30  <0.30 (ng/mL)   LIPID PANEL     Status: Normal   Collection Time   03/28/11  5:53 AM      Component Value Range Comment   Cholesterol 147  0 - 200 (mg/dL)    Triglycerides 64  <469 (mg/dL)    HDL 49  >62 (mg/dL)    Total CHOL/HDL Ratio 3.0      VLDL 13  0 - 40 (mg/dL)    LDL Cholesterol 85  0 - 99 (mg/dL)   GLUCOSE, CAPILLARY     Status: Normal   Collection Time   03/28/11  8:00 AM      Component Value Range Comment   Glucose-Capillary 88  70 - 99 (mg/dL)   CARDIAC PANEL(CRET KIN+CKTOT+MB+TROPI)     Status: Normal   Collection Time   03/28/11 11:40 AM      Component Value Range Comment   Total CK 44  7 - 232 (U/L)    CK,  MB 2.5  0.3 - 4.0 (ng/mL)    Troponin I <0.30  <0.30 (ng/mL)    Relative Index RELATIVE INDEX IS INVALID  0.0 - 2.5    CARDIAC PANEL(CRET KIN+CKTOT+MB+TROPI)     Status: Normal   Collection Time   03/28/11  7:55 PM      Component Value Range Comment   Total CK 50  7 - 232  (U/L)    CK, MB 2.5  0.3 - 4.0 (ng/mL)    Troponin I <0.30  <0.30 (ng/mL)    Relative Index RELATIVE INDEX IS INVALID  0.0 - 2.5    CBC     Status: Abnormal   Collection Time   03/28/11  7:55 PM      Component Value Range Comment   WBC 11.6 (*) 4.0 - 10.5 (K/uL)    RBC 4.31  4.22 - 5.81 (MIL/uL)    Hemoglobin 10.9 (*) 13.0 - 17.0 (g/dL)    HCT 16.1 (*) 09.6 - 52.0 (%)    MCV 80.5  78.0 - 100.0 (fL)    MCH 25.3 (*) 26.0 - 34.0 (pg)    MCHC 31.4  30.0 - 36.0 (g/dL)    RDW 04.5  40.9 - 81.1 (%)    Platelets 591 (*) 150 - 400 (K/uL)   CREATININE, SERUM     Status: Abnormal   Collection Time   03/28/11  7:55 PM      Component Value Range Comment   Creatinine, Ser 1.38 (*) 0.50 - 1.35 (mg/dL)    GFR calc non Af Amer 47 (*) >90 (mL/min)    GFR calc Af Amer 55 (*) >90 (mL/min)   CARDIAC PANEL(CRET KIN+CKTOT+MB+TROPI)     Status: Normal   Collection Time   03/29/11  3:01 AM      Component Value Range Comment   Total CK 42  7 - 232 (U/L)    CK, MB 2.2  0.3 - 4.0 (ng/mL)    Troponin I <0.30  <0.30 (ng/mL)    Relative Index RELATIVE INDEX IS INVALID  0.0 - 2.5    CBC     Status: Abnormal   Collection Time   03/29/11  3:01 AM      Component Value Range Comment   WBC 13.1 (*) 4.0 - 10.5 (K/uL)    RBC 3.99 (*) 4.22 - 5.81 (MIL/uL)    Hemoglobin 10.2 (*) 13.0 - 17.0 (g/dL)    HCT 91.4 (*) 78.2 - 52.0 (%)    MCV 80.2  78.0 - 100.0 (fL)    MCH 25.6 (*) 26.0 - 34.0 (pg)    MCHC 31.9  30.0 - 36.0 (g/dL)    RDW 95.6  21.3 - 08.6 (%)    Platelets 546 (*) 150 - 400 (K/uL)   PRO B NATRIURETIC PEPTIDE     Status: Normal   Collection Time   03/29/11  3:01 AM      Component Value Range Comment   Pro B Natriuretic peptide (BNP) 373.2  0 - 450 (pg/mL)   BASIC METABOLIC PANEL     Status: Abnormal   Collection Time   03/29/11  3:01 AM      Component Value Range Comment   Sodium 132 (*) 135 - 145 (mEq/L)    Potassium 4.3  3.5 - 5.1 (mEq/L)    Chloride 95 (*) 96 - 112 (mEq/L)    CO2 25  19 - 32  (mEq/L) REPEATED TO VERIFY   Glucose, Bld 137 (*) 70 -  99 (mg/dL) REPEATED TO VERIFY   BUN 29 (*) 6 - 23 (mg/dL)    Creatinine, Ser 1.61 (*) 0.50 - 1.35 (mg/dL) REPEATED TO VERIFY   Calcium 9.5  8.4 - 10.5 (mg/dL) REPEATED TO VERIFY   GFR calc non Af Amer 47 (*) >90 (mL/min)    GFR calc Af Amer 54 (*) >90 (mL/min)     Dg Chest Port 1 View  03/27/2011  *RADIOLOGY REPORT*  Clinical Data: Nausea.  PORTABLE CHEST - 1 VIEW  Comparison: 03/06/2011  Findings: Technically limited study due to patient rotation.  The heart size and pulmonary vascularity appear prominent suggesting mild vascular congestion.  The interstitial changes suggesting mild edema.  No focal airspace consolidation.  No blunting of costophrenic angles.  No evidence of pneumothorax.  Healing bilateral rib fractures.  Old ununited displaced left clavicular fracture.  IMPRESSION: Mild cardiac enlargement and pulmonary vascular congestion with slight interstitial edema.  Old post-traumatic changes.  Original Report Authenticated By: Marlon Pel, M.D.    Review of Systems  Constitutional: Negative for fever and chills.  HENT: Negative for hearing loss.   Eyes: Negative for blurred vision and double vision.  Respiratory: Positive for cough and shortness of breath. Negative for hemoptysis and sputum production.   Cardiovascular: Positive for chest pain. Negative for palpitations, orthopnea, claudication, leg swelling and PND.  Gastrointestinal: Positive for nausea and vomiting. Negative for abdominal pain.  Neurological: Negative for dizziness, tingling and headaches.   Blood pressure 109/64, pulse 93, temperature 97.8 F (36.6 C), temperature source Oral, resp. rate 18, height 5\' 7"  (1.702 m), weight 60.2 kg (132 lb 11.5 oz), SpO2 93.00%. Physical Exam  Constitutional: He appears well-developed.  HENT:  Head: Normocephalic.  Nose: Nose normal.  Mouth/Throat: No oropharyngeal exudate.  Eyes: Conjunctivae are normal. No  scleral icterus.  Neck: No JVD present. No thyromegaly present.  Cardiovascular: Normal rate, regular rhythm and normal heart sounds.        No S3 or S4 gallop noted  Respiratory: Effort normal and breath sounds normal. No respiratory distress. He has no wheezes.  GI: Soft. Bowel sounds are normal. He exhibits no distension. There is no tenderness. There is no rebound and no guarding.  Musculoskeletal: He exhibits no edema and no tenderness.  Lymphadenopathy:    He has no cervical adenopathy.    Assessment/Plan: Compensated acute systolic heart failure probably secondary to noncompliance to medication negative nuclear stress test recently Resolving exacerbation of COPD Hypertension History of GI bleeding in the past History of peptic ulcer disease Anemia History of EtOH abuse History of tobacco abuse Strong family history of coronary artery disease History of pneumonia in the past Questionable history of MI in the past per patient and posterolateral wall scarring by nuclear stress test but no evidence of ischemia. Ischemic cardiomyopathy Plan Agree with present management Increase beta blockers and ACE inhibitors as tolerated Okay to discharge from cardiac point of view Dr. Algie Coffer is on call for me for weekend I will follow up with him as outpatient in one to 2 weeks   Burnie Hank N 03/29/2011, 3:17 PM
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
## 2011-03-30 LAB — MAGNESIUM: Magnesium: 1.9 mg/dL (ref 1.5–2.5)

## 2011-03-30 NOTE — Discharge Summary (Signed)
Physician Discharge Summary  Patient ID: Dale Mcfarland MRN: [id number] DOB/AGE: [id number] 76 y.o.  Admit date: 03/27/2011 Discharge date: 03/30/2011  Primary Care Physician:  No primary provider on file.                     (Please note that patient left AGAINST MEDICAL ADVICE)  Discharge Diagnoses:    .CHF (congestive heart failure) .COPD (chronic obstructive pulmonary disease) .Tobacco abuse .Leukocytosis .Anemia due to chronic illness .Shortness of breath  Consults: Cardiology, Dr. Sharyn Lull   Discharge Medications: NONE as patient left AGAINST MEDICAL ADVICE            Brief H and P and hospital course: For complete details please refer to admission H and P, but in brief 78 year old Caucasian, homeless person who on the day of admission without nausea, vomiting but no hematemesis. He also had chronic shortness of breath but worse in the last few days prior to admission. He also reported some wheezing and chest pain across his chest. His nausea and vomiting had resolved in the emergency room but was admitted for further workup of shortness of breath.  Dyspnea appeared to be multifactorial in nature likely secondary to CHF exacerbation and COPD flare. Patient was placed on nebulizer treatments, oxygen, Lasix for diuresis. He was also treated with doxycycline.  Patient recently had echocardiogram in January 2013 which had shown EF of 30-35% and had a stress test done on 03/02/2011 with no reversible ischemia. Echocardiogram was hence not repeated. Cardiology consultation was also obtained due to history of coronary artery disease and increasing dyspnea which has been in the last few days. Patient was seen by Dr. Sharyn Lull and recommended to continue current management, patient has history of noncompliance to medications. On 03/30/2011, patient also was found to be lying on the floor, stated that he was standing on history or working on the TV when he fell off the chair. Vital signs  remained stable and patient denied any pain or injury anywhere. Being in his homeless situation, social worker consult was obtained for information regarding the shelters. Patient refused to go to a shelter and became aggravated and left against medical advice despite the staff begging him not to leave in the snow.  Day of Discharge BP 116/63  Pulse 77  Temp(Src) 97.6 F (36.4 C) (Oral)  Resp 20  Ht 5\' 7"  (1.702 m)  Wt 60.2 kg (132 lb 11.5 oz)  BMI 20.79 kg/m2  SpO2 96%  Physical Exam: General: Alert and awake oriented x3 not in any acute distress. HEENT: anicteric sclera, pupils reactive to light and accommodation CVS: S1-S2 clear no murmur rubs or gallops Chest: clear to auscultation bilaterally, no wheezing rales or rhonchi Abdomen: soft nontender, nondistended, normal bowel sounds, no organomegaly Extremities: no cyanosis, clubbing or edema noted bilaterally Neuro: Cranial nerves II-XII intact, no focal neurological deficits   The results of significant diagnostics from this hospitalization (including imaging, microbiology, ancillary and laboratory) are listed below for reference.    LAB RESULTS: Basic Metabolic Panel:  Lab 03/30/11 4098 03/29/11 0301 03/28/11 1955 03/27/11 2346  NA -- 132* -- 140  K -- 4.3 -- 3.5  CL -- 95* -- 106  CO2 -- 25 -- --  GLUCOSE -- 137* -- 118*  BUN -- 29* -- 16  CREATININE -- 1.39* 1.38* --  CALCIUM -- 9.5 -- --  MG 1.9 -- -- --  PHOS -- -- -- --   CBC:  Lab 03/29/11 0301 03/28/11 1955  03/27/11 2341  WBC 13.1* 11.6* --  NEUTROABS -- -- 9.5*  HGB 10.2* 10.9* --  HCT 32.0* 34.7* --  MCV 80.2 -- --  PLT 546* 591* --   Cardiac Enzymes:  Lab 03/29/11 0301 03/28/11 1955  CKTOTAL 42 50  CKMB 2.2 2.5  CKMBINDEX -- --  TROPONINI <0.30 <0.30   CBG:  Lab 03/28/11 0800  GLUCAP 88    Significant Diagnostic Studies:  Dg Chest Port 1 View  03/27/2011  *RADIOLOGY REPORT*  Clinical Data: Nausea.  PORTABLE CHEST - 1 VIEW  Comparison:  03/06/2011  Findings: Technically limited study due to patient rotation.  The heart size and pulmonary vascularity appear prominent suggesting mild vascular congestion.  The interstitial changes suggesting mild edema.  No focal airspace consolidation.  No blunting of costophrenic angles.  No evidence of pneumothorax.  Healing bilateral rib fractures.  Old ununited displaced left clavicular fracture.  IMPRESSION: Mild cardiac enlargement and pulmonary vascular congestion with slight interstitial edema.  Old post-traumatic changes.  Original Report Authenticated By: Marlon Pel, M.D.     Disposition and Follow-up: None as patient left AMA    DISPOSITION: Patient left AMA   Signed:  Jashay Roddy M.D. [facility] 03/30/2011, 3:49 PM

## 2011-03-30 NOTE — Progress Notes (Signed)
1220 Upon entering room pt was lying on the floor, he stated he was standing in his chair working on his tv, when he fell onto the bed and then slid off it to the floor.  Dr Isidoro Donning was w/me to round on him and saw this too.  He had a noted skin tear on lt hand and stated he hurt nothing else except he hit his lt shoulder.  Denied pain to palpation of shoulder but refused to take off pants or coat to let me do a further skin assessment. He did allow VS which were stable as charted in flowsheet.  Assisted pt back to bed which he was unhappy about, wanted to stay in the window seat, but i explained he needed to be in bed and had to have his bed alarm on, which he was also unhappy about. Will cont to monitor

## 2011-03-30 NOTE — Progress Notes (Addendum)
Pt refuses to go to a shelter, and refuses to sign AMA papers, but  agreed to wait for SW to check on which shelters were open after staff begged him not to leave in the snow.  Refused to get off the floor in the hallway, meanwhile he got very aggravated waiting and left AMA. Dr Isidoro Donning aware. Thinking pt was going to wait for actual d/c, the IV was not taken out before he left down the stairwell.

## 2011-03-30 NOTE — Progress Notes (Signed)
Pt had 7 beats of Vtach. Pt asymptomatic, VSS. Notified Lenny Pastel, NP, orders received, will continue to monitor. Dale Mcfarland, [place]

## 2011-03-31 ENCOUNTER — Emergency Department (HOSPITAL_COMMUNITY): Payer: Medicare Other

## 2011-03-31 ENCOUNTER — Emergency Department (HOSPITAL_COMMUNITY)
Admission: EM | Admit: 2011-03-31 | Discharge: 2011-03-31 | Disposition: A | Discharge status: Home / Self Care | Payer: Medicare Other | Attending: Emergency Medicine | Admitting: Emergency Medicine

## 2011-03-31 ENCOUNTER — Encounter (HOSPITAL_COMMUNITY): Payer: Self-pay

## 2011-03-31 DIAGNOSIS — M549 Dorsalgia, unspecified: Secondary | ICD-10-CM | POA: Insufficient documentation

## 2011-03-31 DIAGNOSIS — Z5900 Homelessness unspecified: Secondary | ICD-10-CM | POA: Insufficient documentation

## 2011-03-31 DIAGNOSIS — R5381 Other malaise: Secondary | ICD-10-CM | POA: Insufficient documentation

## 2011-03-31 DIAGNOSIS — J4489 Other specified chronic obstructive pulmonary disease: Secondary | ICD-10-CM | POA: Insufficient documentation

## 2011-03-31 DIAGNOSIS — J449 Chronic obstructive pulmonary disease, unspecified: Secondary | ICD-10-CM | POA: Insufficient documentation

## 2011-03-31 DIAGNOSIS — R531 Weakness: Secondary | ICD-10-CM

## 2011-03-31 DIAGNOSIS — I10 Essential (primary) hypertension: Secondary | ICD-10-CM | POA: Insufficient documentation

## 2011-03-31 DIAGNOSIS — G8929 Other chronic pain: Secondary | ICD-10-CM | POA: Insufficient documentation

## 2011-03-31 DIAGNOSIS — R55 Syncope and collapse: Secondary | ICD-10-CM | POA: Insufficient documentation

## 2011-03-31 LAB — DIFFERENTIAL
Lymphocytes Relative: 13 % (ref 12–46)
Lymphs Abs: 2 10*3/uL (ref 0.7–4.0)
Monocytes Absolute: 1.3 10*3/uL — ABNORMAL HIGH (ref 0.1–1.0)
Monocytes Relative: 8 % (ref 3–12)
Neutro Abs: 12.4 10*3/uL — ABNORMAL HIGH (ref 1.7–7.7)
Neutrophils Relative %: 79 % — ABNORMAL HIGH (ref 43–77)

## 2011-03-31 LAB — BASIC METABOLIC PANEL
BUN: 39 mg/dL — ABNORMAL HIGH (ref 6–23)
CO2: 28 mEq/L (ref 19–32)
Chloride: 100 mEq/L (ref 96–112)
Creatinine, Ser: 1.09 mg/dL (ref 0.50–1.35)
Potassium: 3.9 mEq/L (ref 3.5–5.1)

## 2011-03-31 LAB — CBC
HCT: 34.6 % — ABNORMAL LOW (ref 39.0–52.0)
Hemoglobin: 11.2 g/dL — ABNORMAL LOW (ref 13.0–17.0)
RBC: 4.28 MIL/uL (ref 4.22–5.81)
WBC: 15.8 10*3/uL — ABNORMAL HIGH (ref 4.0–10.5)

## 2011-03-31 NOTE — ED Notes (Signed)
Pt. Eating a [place] sandwich.  Pt. Is stable, denies any pain or discomfort.  Pt. Continues to feel weak, Warm blankets given and pt. Is warming up.  Will continue to monitor.
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
## 2011-03-31 NOTE — ED Provider Notes (Signed)
History     CSN: [id number]  Arrival date & time 03/31/11  5784   First MD Initiated Contact with Patient 03/31/11 [phone]      Chief Complaint  Patient presents with  . Weakness    (Consider location/radiation/quality/duration/timing/severity/associated sxs/prior treatment) Patient is a 78 y.o. male presenting with weakness. The history is provided by the patient.  Weakness Primary symptoms do not include fever. Primary symptoms comment: "I keep passing out". The symptoms began more than 1 week ago.  Additional symptoms include weakness. Associated symptoms comments: Recent admission for same without finding. He has not followed up with outpatient medical care.. Associated medical issues comments: He denies pain, fever, vomiting..    Past Medical History  Diagnosis Date  . COPD (chronic obstructive pulmonary disease)   . Chronic back pain   . Pneumonia     01/2011 - CAP vs aspiration pneuonmia  . Depression   . PONV (postoperative nausea and vomiting)   . Hyponatremia     Previously felt secondary to SIADH  . Hypertension   . Upper GI bleed     01/2011 with EGD showing severe candida esophagitis and duodenal bulb erosion  . Anemia     In the setting of UGI bleed 01/2011 requiring blood transfusion  . Homelessness     Past Surgical History  Procedure Date  . Tonsillectomy   . Vasectomy   . Appendectomy   . Tonsillectomy   . Colonoscopy   . Esophagogastroduodenoscopy   . Esophagogastroduodenoscopy 02/03/2011    Procedure: ESOPHAGOGASTRODUODENOSCOPY (EGD);  Surgeon: Charna Elizabeth, MD;  Location: [facility];  Service: Endoscopy;  Laterality: N/A;  . Esophagogastroduodenoscopy 02/03/2011    Procedure: ESOPHAGOGASTRODUODENOSCOPY (EGD);  Surgeon: Charna Elizabeth, MD;  Location: [facility];  Service: Endoscopy;  Laterality: N/A;    Family History  Problem Relation Age of Onset  . Coronary artery disease Father     Starting in his 51's. Died of massive MI at age 46.  .  Schizophrenia Brother   . Coronary artery disease Sister     MI at age 45  . Alzheimer's disease Mother     History  Substance Use Topics  . Smoking status: Current Some Day Smoker -- 1.0 packs/day for 60 years    Types: Cigarettes  . Smokeless tobacco: Never Used   Comment: Since age 66  . Alcohol Use: Yes     174 ml of scotch weekly      Review of Systems  Constitutional: Negative for fever and chills.  HENT: Negative.   Respiratory: Negative.   Cardiovascular: Negative.   Gastrointestinal: Negative.   Musculoskeletal: Negative.   Skin: Negative.   Neurological: Positive for syncope and weakness.    Allergies  Penicillins  Home Medications   Current Outpatient Rx  Name Route Sig Dispense Refill  . NAPROXEN SODIUM 220 MG PO TABS Oral Take 440 mg by mouth 2 (two) times daily as needed. For pain      BP 91/52  Pulse 84  Temp(Src) 98 F (36.7 C) (Oral)  Resp 20  Ht 5\' 1"  (1.549 m)  Wt 110 lb (49.896 kg)  BMI 20.78 kg/m2  SpO2 98%  Physical Exam  Constitutional: He is oriented to person, place, and time. He appears well-developed and well-nourished.  HENT:  Head: Normocephalic.  Neck: Normal range of motion. Neck supple.  Cardiovascular: Normal rate and regular rhythm.   Pulmonary/Chest: Effort normal and breath sounds normal.  Abdominal: Soft. Bowel sounds are normal. There is no  tenderness. There is no rebound and no guarding.  Musculoskeletal: Normal range of motion.  Neurological: He is alert and oriented to person, place, and time. He has normal reflexes. No cranial nerve deficit.       Patient ambulates with mild to moderate assist secondary to generalized weakness. Up to shower and patient's weakness is persistent.   Skin: Skin is warm and dry. No rash noted.  Psychiatric: He has a normal mood and affect.    ED Course  Procedures (including critical care time)  Labs Reviewed  CBC - Abnormal; Notable for the following:    WBC 15.8 (*)     Hemoglobin 11.2 (*)    HCT 34.6 (*)    Platelets 519 (*)    All other components within normal limits  DIFFERENTIAL - Abnormal; Notable for the following:    Neutrophils Relative 79 (*)    Neutro Abs 12.4 (*)    Monocytes Absolute 1.3 (*)    All other components within normal limits  BASIC METABOLIC PANEL - Abnormal; Notable for the following:    BUN 39 (*)    GFR calc non Af Amer 63 (*)    GFR calc Af Amer 73 (*)    All other components within normal limits  URINALYSIS, ROUTINE W REFLEX MICROSCOPIC   Dg Chest 2 View  03/31/2011  *RADIOLOGY REPORT*  Clinical Data: Weakness, near-syncope.  CHEST - 2 VIEW  Comparison: 03/27/2011  Findings: Heart size upper limits normal.  Central pulmonary vascular congestion and mild interstitial edema, probably slightly improved since the previous study.  No focal airspace consolidation.  No effusion.   Old displaced left clavicle fracture.  IMPRESSION:  1.  Slight interval improvement in bilateral interstitial edema.  Original Report Authenticated By: Osa Craver, M.D.     No diagnosis found.  Patient appeared weak on ambulation and while showering. No syncope in ED for duration of visit. Care given to [facility], [place], to follow up with post-iv fluid evaluation and urinalysis.  MDM          Rodena Medin, PA-C 03/31/11 1554

## 2011-03-31 NOTE — ED Notes (Signed)
Pt being transported for shower

## 2011-03-31 NOTE — ED Provider Notes (Signed)
10:52 AM  I performed a history and physical examination of Dale Mcfarland and discussed his management with Langley Adie.  I agree with the history, physical, assessment, and plan of care, with the following exceptions: None  The patient is awake, alert, and oriented to person, place, time, and event, in no acute or apparent distress. He is breathing easily without any respiratory distress, and has clear lung sounds throughout all fields without wheezes, rales, or rhonchi. His heart sounds are normal with regular rate and rhythm and full palpable pulses with warm and dry skin.  I was present for the following procedures: None Time Spent in Critical Care of the patient: None Time spent in discussions with the patient and family: 5 minutes  Dale Mcfarland    Felisa Bonier, MD 03/31/11 1053

## 2011-03-31 NOTE — ED Provider Notes (Signed)
3:48 PM Pt care resumed from Langley Adie, [place].  Homeless patient with a history of anemia, COPD and depression presented with chief complaint of generalized weakness and syncope.  Patient was recently admitted for similar symptoms at which time a workup including chest x-ray serial troponins and EKG were within normal limits.  Previous provider ordered chest x-ray ,basic metabolic panel and CBC.  Results listed below.  Per her plan patient will be discharged after 1 L bolus and urine analysis has been completed.  Pt has been fed and showered.   CBC    Component Value Date/Time   WBC 15.8* 03/31/2011 1045   RBC 4.28 03/31/2011 1045   HGB 11.2* 03/31/2011 1045   HCT 34.6* 03/31/2011 1045   PLT 519* 03/31/2011 1045   MCV 80.8 03/31/2011 1045   MCH 26.2 03/31/2011 1045   MCHC 32.4 03/31/2011 1045   RDW 15.5 03/31/2011 1045   LYMPHSABS 2.0 03/31/2011 1045   MONOABS 1.3* 03/31/2011 1045   EOSABS 0.1 03/31/2011 1045   BASOSABS 0.0 03/31/2011 1045    BMET    Component Value Date/Time   NA 138 03/31/2011 1045   K 3.9 03/31/2011 1045   CL 100 03/31/2011 1045   CO2 28 03/31/2011 1045   GLUCOSE 95 03/31/2011 1045   BUN 39* 03/31/2011 1045   CREATININE 1.09 03/31/2011 1045   CALCIUM 9.5 03/31/2011 1045   GFRNONAA 63* 03/31/2011 1045   GFRAA 73* 03/31/2011 1045   CHEST - 2 VIEW  Comparison: 03/27/2011  Findings: Heart size upper limits normal. Central pulmonary vascular congestion and mild interstitial edema, probably slightly improved since the previous study. No focal airspace consolidation. No effusion. Old displaced left clavicle fracture.  IMPRESSION:  1. Slight interval improvement in bilateral interstitial edema.  Original Report Authenticated By: Osa Craver, M.D.  4:15 PM Patient refuses urinalysis and states that he needs to leave to catch the 5 o'clock bus.  Patient declined in and out catheter.  Patient is currently hemodynamically stable and and bleeding around the emergency  department without difficulty, ataxia, or dizziness.  Patient increasing white count was discussed, patient denies abdominal pain urinary symptoms, chest pain, shortness of breath, specific extremity weakness.  Patient to be discharged Patient has been advised to call [facility] for followup.    Jaci Carrel, [place] 03/31/11 1709

## 2011-03-31 NOTE — ED Notes (Signed)
Pt returned from shower

## 2011-03-31 NOTE — ED Provider Notes (Signed)
Medical screening examination/treatment/procedure(s) were performed by non-physician practitioner and as supervising physician I was immediately available for consultation/collaboration.   Benny Lennert, MD 03/31/11 [phone]

## 2011-03-31 NOTE — Discharge Instructions (Signed)
Weakness, Generalized Without Cause  Your caregiver has seen you today because you are having problems with feelings of weakness. Weakness has many different causes, some of which are common and others are very rare. The causes of weakness are so numerous they could not all be listed on this page.  The exam and other tests done today do not reveal a specific cause for the weakness that is an immediate danger or something that is treatable. Your caregiver has checked you for the most common causes of weakness and feels it is safe for you to go home and be observed.  HOME CARE INSTRUCTIONS  For the time being, obtain more rest if needed.  Eat a well balanced diet.  Try to get at least some exercise every day in spite of how difficult it may seem at times. In the case of the elderly, exercise is especially important. As we grow older, there is a loss of muscle mass. Generally, there is also a loss of, or decrease in, activity that comes naturally with the aging process. Exercise and increased activities are the only tools we have to combat this natural process.  The results of some tests ordered today may not be available right away. You will be contacted with those results when they become available.  It is important to follow through with your physician as per instructions that you may have received today.  SEEK MEDICAL CARE IF:  You have any new concerns which you do not feel were dealt with today.  The weakness seems to be getting progressively worse.  You develop new or unusual aches or pains.  SEEK IMMEDIATE MEDICAL CARE IF:  You are unable to tend to your usual daily activities such as simply getting dressed, feeding yourself, or keeping up with your personal hygiene.  You develop inability to walk stairs or perform your usual daily activities.  You develop shortness of breath, chest pain, have difficulty moving parts of your body, or develop new problems for which you have not talked to your  caregiver.  You experience difficulty speaking or swallowing.  You develop loss of control of bladder or bowels that was not present before.

## 2011-03-31 NOTE — ED Notes (Signed)
Patient transported to X-ray 

## 2011-03-31 NOTE — ED Notes (Signed)
Paged security to take pt to take shower due to pt being covered in fecal material

## 2011-04-09 ENCOUNTER — Inpatient Hospital Stay (HOSPITAL_COMMUNITY)
Admission: EM | Admit: 2011-04-09 | Discharge: 2011-04-17 | DRG: 291 | Disposition: A | Discharge status: Home / Self Care | Payer: Medicare Other | Attending: Family Medicine | Admitting: Family Medicine

## 2011-04-09 ENCOUNTER — Encounter (HOSPITAL_COMMUNITY): Payer: Self-pay | Admitting: Emergency Medicine

## 2011-04-09 DIAGNOSIS — Z5900 Homelessness unspecified: Secondary | ICD-10-CM

## 2011-04-09 DIAGNOSIS — Z720 Tobacco use: Secondary | ICD-10-CM | POA: Diagnosis present

## 2011-04-09 DIAGNOSIS — N170 Acute kidney failure with tubular necrosis: Secondary | ICD-10-CM | POA: Diagnosis present

## 2011-04-09 DIAGNOSIS — D72829 Elevated white blood cell count, unspecified: Secondary | ICD-10-CM | POA: Diagnosis present

## 2011-04-09 DIAGNOSIS — J440 Chronic obstructive pulmonary disease with acute lower respiratory infection: Secondary | ICD-10-CM | POA: Diagnosis present

## 2011-04-09 DIAGNOSIS — I5031 Acute diastolic (congestive) heart failure: Principal | ICD-10-CM | POA: Diagnosis present

## 2011-04-09 DIAGNOSIS — F17200 Nicotine dependence, unspecified, uncomplicated: Secondary | ICD-10-CM | POA: Diagnosis present

## 2011-04-09 DIAGNOSIS — I509 Heart failure, unspecified: Secondary | ICD-10-CM | POA: Diagnosis present

## 2011-04-09 DIAGNOSIS — Z91199 Patient's noncompliance with other medical treatment and regimen due to unspecified reason: Secondary | ICD-10-CM

## 2011-04-09 DIAGNOSIS — J449 Chronic obstructive pulmonary disease, unspecified: Secondary | ICD-10-CM | POA: Diagnosis present

## 2011-04-09 DIAGNOSIS — J209 Acute bronchitis, unspecified: Secondary | ICD-10-CM | POA: Diagnosis present

## 2011-04-09 DIAGNOSIS — D649 Anemia, unspecified: Secondary | ICD-10-CM | POA: Diagnosis present

## 2011-04-09 DIAGNOSIS — I9589 Other hypotension: Secondary | ICD-10-CM | POA: Diagnosis not present

## 2011-04-09 DIAGNOSIS — Z9119 Patient's noncompliance with other medical treatment and regimen: Secondary | ICD-10-CM

## 2011-04-09 DIAGNOSIS — E871 Hypo-osmolality and hyponatremia: Secondary | ICD-10-CM | POA: Diagnosis present

## 2011-04-09 DIAGNOSIS — R0602 Shortness of breath: Secondary | ICD-10-CM | POA: Diagnosis present

## 2011-04-09 NOTE — ED Notes (Signed)
Pt alert, nad,presents via EMS, c/o n/v/d, onset this am, pt is homeless, recently, seen in [facility] with same c/o, resp even unlaobred, skin pwd

## 2011-04-09 NOTE — ED Provider Notes (Signed)
Evaluation and management procedures were performed by the PA/NP under my supervision/collaboration.   Felisa Bonier, MD 04/09/11 2011

## 2011-04-10 ENCOUNTER — Other Ambulatory Visit: Payer: Self-pay

## 2011-04-10 ENCOUNTER — Encounter (HOSPITAL_COMMUNITY): Payer: Self-pay | Admitting: Family Medicine

## 2011-04-10 ENCOUNTER — Emergency Department (HOSPITAL_COMMUNITY): Payer: Medicare Other

## 2011-04-10 LAB — BASIC METABOLIC PANEL
BUN: 25 mg/dL — ABNORMAL HIGH (ref 6–23)
Calcium: 9.3 mg/dL (ref 8.4–10.5)
Chloride: 104 mEq/L (ref 96–112)
Creatinine, Ser: 0.83 mg/dL (ref 0.50–1.35)
GFR calc Af Amer: >90 mL/min (ref >90)
GFR calc non Af Amer: 82 mL/min — ABNORMAL LOW (ref >90)

## 2011-04-10 LAB — URINALYSIS, ROUTINE W REFLEX MICROSCOPIC
Hgb urine dipstick: NEGATIVE
Nitrite: NEGATIVE
Protein, ur: NEGATIVE mg/dL
Specific Gravity, Urine: 1.021 (ref 1.005–1.030)
Urobilinogen, UA: 0.2 mg/dL (ref 0.0–1.0)

## 2011-04-10 LAB — CBC
HCT: 32.6 % — ABNORMAL LOW (ref 39.0–52.0)
MCH: 25.7 pg — ABNORMAL LOW (ref 26.0–34.0)
MCHC: 31.6 g/dL (ref 30.0–36.0)
RDW: 16.3 % — ABNORMAL HIGH (ref 11.5–15.5)

## 2011-04-10 LAB — POCT I-STAT TROPONIN I: Troponin i, poc: 0 ng/mL (ref 0.00–0.08)

## 2011-04-10 LAB — DIFFERENTIAL
Basophils Absolute: 0.1 10*3/uL (ref 0.0–0.1)
Basophils Relative: 0 % (ref 0–1)
Eosinophils Absolute: 0.2 10*3/uL (ref 0.0–0.7)
Eosinophils Relative: 1 % (ref 0–5)
Monocytes Absolute: 1.4 10*3/uL — ABNORMAL HIGH (ref 0.1–1.0)
Neutro Abs: 12.6 10*3/uL — ABNORMAL HIGH (ref 1.7–7.7)

## 2011-04-10 LAB — POCT I-STAT, CHEM 8
Calcium, Ion: 1.17 mmol/L (ref 1.12–1.32)
HCT: 31 % — ABNORMAL LOW (ref 39.0–52.0)
Hemoglobin: 10.5 g/dL — ABNORMAL LOW (ref 13.0–17.0)
Sodium: 138 mEq/L (ref 135–145)
TCO2: 23 mmol/L (ref 0–100)

## 2011-04-10 LAB — PRO B NATRIURETIC PEPTIDE: Pro B Natriuretic peptide (BNP): 689.2 pg/mL — ABNORMAL HIGH (ref 0–450)

## 2011-04-10 MED ORDER — ENOXAPARIN SODIUM 40 MG/0.4ML ~~LOC~~ SOLN
40.0000 mg | SUBCUTANEOUS | Status: DC
  Administered 2011-04-10 – 2011-04-16 (×7): 40 mg via SUBCUTANEOUS
  Filled 2011-04-10 (×8): qty 0.4

## 2011-04-10 MED ORDER — ALBUTEROL SULFATE HFA 108 (90 BASE) MCG/ACT IN AERS
1.0000 | INHALATION_SPRAY | Freq: Once | RESPIRATORY_TRACT | Status: AC
  Administered 2011-04-10: 1 via RESPIRATORY_TRACT
  Filled 2011-04-10: qty 6.7

## 2011-04-10 MED ORDER — LISINOPRIL 10 MG PO TABS
10.0000 mg | ORAL_TABLET | Freq: Every day | ORAL | Status: DC
  Administered 2011-04-10 – 2011-04-11 (×2): 10 mg via ORAL
  Filled 2011-04-10 (×3): qty 1

## 2011-04-10 MED ORDER — ALBUTEROL SULFATE (5 MG/ML) 0.5% IN NEBU
5.0000 mg | INHALATION_SOLUTION | Freq: Once | RESPIRATORY_TRACT | Status: AC
  Administered 2011-04-10: 5 mg via RESPIRATORY_TRACT
  Filled 2011-04-10: qty 1

## 2011-04-10 MED ORDER — FUROSEMIDE 10 MG/ML IJ SOLN
40.0000 mg | Freq: Once | INTRAMUSCULAR | Status: AC
  Administered 2011-04-10: 40 mg via INTRAVENOUS
  Filled 2011-04-10: qty 4

## 2011-04-10 MED ORDER — LEVOFLOXACIN IN D5W 500 MG/100ML IV SOLN
500.0000 mg | INTRAVENOUS | Status: DC
  Administered 2011-04-10 – 2011-04-11 (×2): 500 mg via INTRAVENOUS
  Filled 2011-04-10 (×3): qty 100

## 2011-04-10 MED ORDER — ASPIRIN 81 MG PO CHEW
81.0000 mg | CHEWABLE_TABLET | Freq: Every day | ORAL | Status: DC
  Administered 2011-04-10 – 2011-04-17 (×8): 81 mg via ORAL
  Filled 2011-04-10 (×8): qty 1

## 2011-04-10 MED ORDER — IPRATROPIUM BROMIDE 0.02 % IN SOLN
0.5000 mg | Freq: Once | RESPIRATORY_TRACT | Status: AC
  Administered 2011-04-10: 0.5 mg via RESPIRATORY_TRACT
  Filled 2011-04-10: qty 2.5

## 2011-04-10 MED ORDER — FUROSEMIDE 10 MG/ML IJ SOLN
40.0000 mg | Freq: Two times a day (BID) | INTRAMUSCULAR | Status: DC
  Administered 2011-04-10 – 2011-04-11 (×3): 40 mg via INTRAVENOUS
  Filled 2011-04-10 (×5): qty 4

## 2011-04-10 MED ORDER — SODIUM CHLORIDE 0.9 % IJ SOLN: 3.0000 mL | INTRAMUSCULAR | Status: DC | PRN

## 2011-04-10 NOTE — ED Notes (Signed)
Patient transported to X-ray 

## 2011-04-10 NOTE — ED Notes (Signed)
MD at bedside. 

## 2011-04-10 NOTE — H&P (Signed)
PCP:   None   Chief Complaint:  Shortness of breath  HPI: This is a 78 year old gentleman who is homeless. He was recently admitted here at 03/27/2011 for congestive heart failure. He had the 2-D echo which relieved with 30-35%. He apparently left AMA. The patient represents with complaints of ongoing shortness of breath. He states he has shortness of breath after walking a few yards. He reports no lower extremity edema. He reports no chest pain,some possible palpitations. He states he is noncompliant with medication, stating 'I cannot afford them'. Reports some mild wheezing, he does smoke tobacco and drinks alcohol. He states he does get nauseous and vomiting occasionally. No history of variceal bleeding. Patient is homeless.  Review of Systems:  anorexia, fever, weight loss,, vision loss, decreased hearing, hoarseness, chest pain, syncope, dyspnea on exertion, peripheral edema, balance deficits, hemoptysis, abdominal pain, melena, hematochezia, severe indigestion/heartburn, hematuria, incontinence, genital sores, muscle weakness, suspicious skin lesions, transient blindness, difficulty walking, depression, unusual weight change, abnormal bleeding, enlarged lymph nodes, angioedema, and breast masses.  Past Medical History: Past Medical History  Diagnosis Date  . COPD (chronic obstructive pulmonary disease)   . Chronic back pain   . Pneumonia     01/2011 - CAP vs aspiration pneuonmia  . Depression   . PONV (postoperative nausea and vomiting)   . Hyponatremia     Previously felt secondary to SIADH  . Hypertension   . Upper GI bleed     01/2011 with EGD showing severe candida esophagitis and duodenal bulb erosion  . Anemia     In the setting of UGI bleed 01/2011 requiring blood transfusion  . Homelessness    Past Surgical History  Procedure Date  . Tonsillectomy   . Vasectomy   . Appendectomy   . Tonsillectomy   . Colonoscopy   . Esophagogastroduodenoscopy   .  Esophagogastroduodenoscopy 02/03/2011    Procedure: ESOPHAGOGASTRODUODENOSCOPY (EGD);  Surgeon: Charna Elizabeth, MD;  Location: [facility];  Service: Endoscopy;  Laterality: N/A;  . Esophagogastroduodenoscopy 02/03/2011    Procedure: ESOPHAGOGASTRODUODENOSCOPY (EGD);  Surgeon: Charna Elizabeth, MD;  Location: [facility];  Service: Endoscopy;  Laterality: N/A;    Medications: Prior to Admission medications   Not on File  Patient states he does not take any medications  Allergies:   Allergies  Allergen Reactions  . Penicillins Itching    Social History:  reports that he has been smoking Cigarettes.  He has a 60 pack-year smoking history. He has never used smokeless tobacco. He reports that he drinks alcohol. He reports that he does not use illicit drugs.  Family History: Family History  Problem Relation Age of Onset  . Coronary artery disease Father     Starting in his 25's. Died of massive MI at age 32.  . Schizophrenia Brother   . Coronary artery disease Sister     MI at age 52  . Alzheimer's disease Mother     Physical Exam: Filed Vitals:   04/09/11 2134 04/10/11 0549  BP: 188/88 134/68  Pulse: 102 93  Temp: 98.8 F (37.1 C) 98.9 F (37.2 C)  TempSrc: Oral Oral  Resp: 20 18  SpO2: 100% 99%    General:  Alert and oriented times three, unkempt, dirty disheveled gentleman Eyes: PERRLA, pink conjunctiva, no scleral icterus ENT: Moist oral mucosa, neck supple, no thyromegaly Lungs: clear to ascultation, mild wheeze, mild crackles, no use of accessory muscles Cardiovascular: regular rate and rhythm, no regurgitation, no gallops, no murmurs. No carotid bruits, no  JVD Abdomen: soft, positive BS, non-tender, non-distended, no organomegaly, not an acute abdomen GU: not examined Neuro: CN II - XII grossly intact, sensation intact Musculoskeletal: strength 5/5 all extremities, no clubbing, cyanosis or edema Skin: no rash, no subcutaneous crepitation, no decubitus    Labs on  Admission:   Basename 04/10/11 0427 04/10/11 0415  NA 138 135  K 4.0 3.9  CL 109 104  CO2 -- 23  GLUCOSE 99 103*  BUN 26* 25*  CREATININE 0.90 0.83  CALCIUM -- 9.3  MG -- --  PHOS -- --   No results found for this basename: AST:2,ALT:2,ALKPHOS:2,BILITOT:2,PROT:2,ALBUMIN:2 in the last 72 hours No results found for this basename: LIPASE:2,AMYLASE:2 in the last 72 hours  Basename 04/10/11 0427 04/10/11 0415  WBC -- 15.9*  NEUTROABS -- 12.6*  HGB 10.5* 10.3*  HCT 31.0* 32.6*  MCV -- 81.3  PLT -- 490*   No results found for this basename: CKTOTAL:3,CKMB:3,CKMBINDEX:3,TROPONINI:3 in the last 72 hours No components found with this basename: POCBNP:3 No results found for this basename: DDIMER:2 in the last 72 hours No results found for this basename: HGBA1C:2 in the last 72 hours No results found for this basename: CHOL:2,HDL:2,LDLCALC:2,TRIG:2,CHOLHDL:2,LDLDIRECT:2 in the last 72 hours No results found for this basename: TSH,T4TOTAL,FREET3,T3FREE,THYROIDAB in the last 72 hours No results found for this basename: VITAMINB12:2,FOLATE:2,FERRITIN:2,TIBC:2,IRON:2,RETICCTPCT:2 in the last 72 hours  Micro Results: No results found for this or any previous visit (from the past 240 hour(s)).   Radiological Exams on Admission: Dg Chest 2 View  04/10/2011  *RADIOLOGY REPORT*  Clinical Data: Shortness of breath and nausea.  CHEST - 2 VIEW  Comparison: Chest radiograph performed 03/31/2011  Findings: The lungs are well-aerated.  Vascular congestion is noted.  Increased interstitial markings raise question for mild pulmonary edema, worsened from the prior study.  No pleural effusion or pneumothorax is seen.  The heart is borderline normal in size; the mediastinal contour is within normal limits.  No acute osseous abnormalities are seen.  IMPRESSION: Vascular congestion; new increased interstitial markings raise concern for mild new pulmonary edema.  Original Report Authenticated By: Tonia Ghent,  M.D.   EKG normal sinus rhythm  Assessment/Plan Present on Admission:  .CHF (congestive heart failure) -systolic  Admit to telemetry Patient's most recent EF is 30-35% IV Lasix ordered and the ACE inhibitor The patient is noncompliant with care or medication  Baby aspirin daily  Leukocytosis Unclear etiology Will order blood cultures and urine cultures Start empiric antibiotics  .Tobacco abuse .COPD (chronic obstructive pulmonary disease) .Anemia due to chronic illness Stable resume home medication Nicotine patch Homelessness Consider social worker consult  DVT prophylaxis Full code Team 2/Dr. Juliann Pares, Haim Hansson 04/10/2011, 6:15 AM

## 2011-04-10 NOTE — ED Provider Notes (Signed)
History     CSN: [id number]  Arrival date & time 04/09/11  2126   First MD Initiated Contact with Patient 04/10/11 0240      Chief Complaint  Patient presents with  . Nausea    (Consider location/radiation/quality/duration/timing/severity/associated sxs/prior treatment) HPI History provided by pt.   Pt presents w/ multiple complaints.  Has been coughing up sputum x 4 days.  Associated w/ SOB.  These sx usually occur after dark though denies orthopnea.  Also occurs if he walks more than 90 yards. Denies chest pain and fever.  Has h/o chronic bronchitis and these sx are typical.  Does not have an albuterol inhaler.  Smokes cigarettes.  Also c/o intermittent syncope x 1 year.  Most recent episode occurred last night while sitting in bed.  Prodrome of lightheadedness; no CP, SOB or palpitations.  Unwitnessed.  Did not fall.  Also c/o cold/numb hands.  Pt is homeless and sleeps in a tent but his hands are the only part of his body that are cold.  He has had a pain in RUQ intermittently x 2 years.  No recent change in characteristics of pain. Denies N/V/D.  Per prior chart, pt admitted for COPD and CHF exacerbation 2/13-2/16/13.  Was treated w/ nebulizers, diuretics and doxycycline but left AMA and no medications prescribed.  Seen in ED on 2/17 for syncope and weakness and had neg evaluation at that time.  Had a pharmacologic stress test on 02/27/11 that was neg for reversible defect or inducible ischemia and a transthoracic echo 2/15 that showed normal systolic fxn and EF of 55%.    Past Medical History  Diagnosis Date  . COPD (chronic obstructive pulmonary disease)   . Chronic back pain   . Pneumonia     01/2011 - CAP vs aspiration pneuonmia  . Depression   . PONV (postoperative nausea and vomiting)   . Hyponatremia     Previously felt secondary to SIADH  . Hypertension   . Upper GI bleed     01/2011 with EGD showing severe candida esophagitis and duodenal bulb erosion  . Anemia     In the  setting of UGI bleed 01/2011 requiring blood transfusion  . Homelessness     Past Surgical History  Procedure Date  . Tonsillectomy   . Vasectomy   . Appendectomy   . Tonsillectomy   . Colonoscopy   . Esophagogastroduodenoscopy   . Esophagogastroduodenoscopy 02/03/2011    Procedure: ESOPHAGOGASTRODUODENOSCOPY (EGD);  Surgeon: Charna Elizabeth, MD;  Location: [facility];  Service: Endoscopy;  Laterality: N/A;  . Esophagogastroduodenoscopy 02/03/2011    Procedure: ESOPHAGOGASTRODUODENOSCOPY (EGD);  Surgeon: Charna Elizabeth, MD;  Location: [facility];  Service: Endoscopy;  Laterality: N/A;    Family History  Problem Relation Age of Onset  . Coronary artery disease Father     Starting in his 22's. Died of massive MI at age 34.  . Schizophrenia Brother   . Coronary artery disease Sister     MI at age 12  . Alzheimer's disease Mother     History  Substance Use Topics  . Smoking status: Current Some Day Smoker -- 1.0 packs/day for 60 years    Types: Cigarettes  . Smokeless tobacco: Never Used   Comment: Since age 77  . Alcohol Use: Yes     174 ml of scotch weekly      Review of Systems  All other systems reviewed and are negative.    Allergies  Penicillins  Home Medications  No  current outpatient prescriptions on file.  BP 188/88  Pulse 102  Temp(Src) 98.8 F (37.1 C) (Oral)  Resp 20  SpO2 100%  Physical Exam  Nursing note and vitals reviewed. Constitutional: He is oriented to person, place, and time. He appears well-developed and well-nourished. No distress.       Poor hygiene  HENT:  Head: Normocephalic and atraumatic.  Eyes:       Normal appearance  Neck: Normal range of motion.  Cardiovascular: Normal rate, regular rhythm and intact distal pulses.   Pulmonary/Chest: Effort normal and breath sounds normal. No respiratory distress. He has no wheezes. He has no rales.  Abdominal: Soft. Bowel sounds are normal. He exhibits no distension. There is no  tenderness. There is no guarding.  Musculoskeletal:       No peripheral edema or calf tenderness  Neurological: He is alert and oriented to person, place, and time.  Skin: Skin is warm and dry. No rash noted.  Psychiatric: He has a normal mood and affect. His behavior is normal.    ED Course  Procedures (including critical care time)   Date: 04/10/2011  Rate: 92  Rhythm: normal sinus rhythm and premature ventricular contractions (PVC)  QRS Axis: normal  Intervals: QT prolonged  ST/T Wave abnormalities: normal  Conduction Disutrbances:none  Narrative Interpretation:   Old EKG Reviewed: unchanged   Labs Reviewed  PRO B NATRIURETIC PEPTIDE - Abnormal; Notable for the following:    Pro B Natriuretic peptide (BNP) 689.2 (*)    All other components within normal limits  CBC - Abnormal; Notable for the following:    WBC 15.9 (*)    RBC 4.01 (*)    Hemoglobin 10.3 (*)    HCT 32.6 (*)    MCH 25.7 (*)    RDW 16.3 (*)    Platelets 490 (*)    All other components within normal limits  DIFFERENTIAL - Abnormal; Notable for the following:    Neutrophils Relative 79 (*)    Neutro Abs 12.6 (*)    Lymphocytes Relative 11 (*)    Monocytes Absolute 1.4 (*)    All other components within normal limits  BASIC METABOLIC PANEL - Abnormal; Notable for the following:    Glucose, Bld 103 (*)    BUN 25 (*)    GFR calc non Af Amer 82 (*)    All other components within normal limits  POCT I-STAT, CHEM 8 - Abnormal; Notable for the following:    BUN 26 (*)    Hemoglobin 10.5 (*)    HCT 31.0 (*)    All other components within normal limits   Dg Chest 2 View  04/10/2011  *RADIOLOGY REPORT*  Clinical Data: Shortness of breath and nausea.  CHEST - 2 VIEW  Comparison: Chest radiograph performed 03/31/2011  Findings: The lungs are well-aerated.  Vascular congestion is noted.  Increased interstitial markings raise question for mild pulmonary edema, worsened from the prior study.  No pleural effusion  or pneumothorax is seen.  The heart is borderline normal in size; the mediastinal contour is within normal limits.  No acute osseous abnormalities are seen.  IMPRESSION: Vascular congestion; new increased interstitial markings raise concern for mild new pulmonary edema.  Original Report Authenticated By: Tonia Ghent, M.D.     1. CHF (congestive heart failure)       MDM  78yo M w/ recent admission for CHF and COPD in which he left AMA prior to completion of treatment, returns to ER today  w/ productive cough and exertional SOB x 4 days, among several other complaints.  No respiratory distress, coughing or rales and VS w/in nml range on exam. BNP elevated at 689 and CXR shows new mild pulmonary edema.  Troponin pending and labs otherwise unremarkable.  [facility] consulted for admission for CHF exacerbation.  40mg  IV lasix ordered.          Otilio Miu, [place] 04/10/11 [phone]

## 2011-04-10 NOTE — Progress Notes (Signed)
Patient seen and examined. He presented with dyspnea, he has not been taking his medications.  Agree with Assessment and plan as per Dr Joneen Roach. Continue with lasix and empiric antibiotic.

## 2011-04-10 NOTE — ED Notes (Signed)
Pt refuse to wear montior MD aware Gets very agitated when he is instructed to wear spot checked

## 2011-04-10 NOTE — ED Provider Notes (Signed)
Medical screening examination/treatment/procedure(s) were performed by non-physician practitioner and as supervising physician I was immediately available for consultation/collaboration.   Brantley Naser A. Patrica Duel, MD 04/10/11 [phone]

## 2011-04-10 NOTE — Progress Notes (Signed)
ED CM noted no pcp listed in EPIC. Spoke with pt who states he has previously been seen by a male [facility] MD.  Pt is familiar with [facility] Has an appointment per [facility] on Monday March 4, 201at 2:30 pm  to see Dr Gabriel Earing at [facility].  Consulted with [facility] coordinator.  Provided pt with written [place] resources for housing, [employer], [employer], [facility], [facility] analyst clinic and financial resources. Pt noted in previous January and February 2013 CM focus notes that pt has left AMA x 2.  Pt reports he does not have an orange card for [facility].

## 2011-04-11 LAB — BASIC METABOLIC PANEL
BUN: 40 mg/dL — ABNORMAL HIGH (ref 6–23)
Calcium: 9.2 mg/dL (ref 8.4–10.5)
GFR calc Af Amer: 56 mL/min — ABNORMAL LOW (ref >90)
GFR calc non Af Amer: 48 mL/min — ABNORMAL LOW (ref >90)
Glucose, Bld: 101 mg/dL — ABNORMAL HIGH (ref 70–99)
Sodium: 134 mEq/L — ABNORMAL LOW (ref 135–145)

## 2011-04-11 LAB — MAGNESIUM: Magnesium: 2 mg/dL (ref 1.5–2.5)

## 2011-04-11 LAB — PRO B NATRIURETIC PEPTIDE: Pro B Natriuretic peptide (BNP): 182.6 pg/mL (ref 0–450)

## 2011-04-11 LAB — CBC
MCH: 25.7 pg — ABNORMAL LOW (ref 26.0–34.0)
MCHC: 31.6 g/dL (ref 30.0–36.0)
Platelets: 439 10*3/uL — ABNORMAL HIGH (ref 150–400)

## 2011-04-11 MED ORDER — FUROSEMIDE 40 MG PO TABS
40.0000 mg | ORAL_TABLET | Freq: Every day | ORAL | Status: DC
  Filled 2011-04-11: qty 1

## 2011-04-11 MED ORDER — MENTHOL 3 MG MT LOZG
1.0000 | LOZENGE | OROMUCOSAL | Status: DC | PRN
  Filled 2011-04-11: qty 9

## 2011-04-11 MED ORDER — FUROSEMIDE 10 MG/ML IJ SOLN: 40.0000 mg | Freq: Every day | INTRAMUSCULAR | Status: DC

## 2011-04-11 MED ORDER — LEVOFLOXACIN 500 MG PO TABS
500.0000 mg | ORAL_TABLET | Freq: Every day | ORAL | Status: DC
  Administered 2011-04-11 – 2011-04-13 (×3): 500 mg via ORAL
  Filled 2011-04-11 (×3): qty 1

## 2011-04-11 MED ORDER — ONDANSETRON HCL 4 MG PO TABS
4.0000 mg | ORAL_TABLET | Freq: Four times a day (QID) | ORAL | Status: DC | PRN
  Administered 2011-04-11: 4 mg via ORAL
  Filled 2011-04-11: qty 1

## 2011-04-11 MED ORDER — CARVEDILOL 3.125 MG PO TABS
3.1250 mg | ORAL_TABLET | Freq: Two times a day (BID) | ORAL | Status: DC
  Filled 2011-04-11 (×2): qty 1

## 2011-04-11 MED ORDER — LISINOPRIL 5 MG PO TABS
5.0000 mg | ORAL_TABLET | Freq: Every day | ORAL | Status: DC
  Filled 2011-04-11: qty 1

## 2011-04-11 NOTE — Progress Notes (Signed)
Subjective: Still with SOB but better. Cough better. Objective: Filed Vitals:   04/11/11 0229 04/11/11 0600 04/11/11 0700 04/11/11 1032  BP: 122/69 112/55 105/76 128/81  Pulse:  94 71   Temp:  98.1 F (36.7 C)    TempSrc:  Oral    Resp:  16    Height:      Weight:  59.512 kg (131 lb 3.2 oz)    SpO2: 90% 97%     Weight change:   Intake/Output Summary (Last 24 hours) at 04/11/11 1520 Last data filed at 04/11/11 1000  Gross per 24 hour  Intake    472 ml  Output   1650 ml  Net  -1178 ml    General: Alert, awake, oriented x3, in no acute distress.  HEENT: No bruits, no goiter.  Heart: Regular rate and rhythm, without murmurs, rubs, gallops.  Lungs: Crackles bl, bilateral air movement.  Abdomen: Soft, nontender, nondistended, positive bowel sounds.  Neuro: Grossly intact, nonfocal. Extremities; no edema.  Lab Results:  Basename 04/11/11 0511 04/10/11 0427 04/10/11 0415  NA 134* 138 --  K 3.8 4.0 --  CL 99 109 --  CO2 24 -- 23  GLUCOSE 101* 99 --  BUN 40* 26* --  CREATININE 1.35 0.90 --  CALCIUM 9.2 -- 9.3  MG -- -- --  PHOS -- -- --    Basename 04/11/11 0511 04/10/11 0427 04/10/11 0415  WBC 15.6* -- 15.9*  NEUTROABS -- -- 12.6*  HGB 9.7* 10.5* --  HCT 30.7* 31.0* --  MCV 81.4 -- 81.3  PLT 439* -- 490*    Micro Results: Recent Results (from the past 240 hour(s))  CULTURE, BLOOD (ROUTINE X 2)     Status: Normal (Preliminary result)   Collection Time   04/10/11  6:35 AM      Component Value Range Status Comment   Specimen Description BLOOD LEFT ARM   Final    Special Requests BOTTLES DRAWN AEROBIC AND ANAEROBIC 6 CC EACH   Final    Culture  Setup Time [id number]   Final    Culture     Final    Value:        BLOOD CULTURE RECEIVED NO GROWTH TO DATE CULTURE WILL BE HELD FOR 5 DAYS BEFORE ISSUING A FINAL NEGATIVE REPORT   Report Status PENDING   Incomplete   CULTURE, BLOOD (ROUTINE X 2)     Status: Normal (Preliminary result)   Collection Time   04/10/11   7:08 AM      Component Value Range Status Comment   Specimen Description BLOOD RIGHT ANTECUBITAL   Final    Special Requests BOTTLES DRAWN AEROBIC ONLY 10 CC   Final    Culture  Setup Time [id number]   Final    Culture     Final    Value:        BLOOD CULTURE RECEIVED NO GROWTH TO DATE CULTURE WILL BE HELD FOR 5 DAYS BEFORE ISSUING A FINAL NEGATIVE REPORT   Report Status PENDING   Incomplete     Studies/Results: Dg Chest 2 View  04/10/2011  *RADIOLOGY REPORT*  Clinical Data: Shortness of breath and nausea.  CHEST - 2 VIEW  Comparison: Chest radiograph performed 03/31/2011  Findings: The lungs are well-aerated.  Vascular congestion is noted.  Increased interstitial markings raise question for mild pulmonary edema, worsened from the prior study.  No pleural effusion or pneumothorax is seen.  The heart is borderline normal in size; the mediastinal contour  is within normal limits.  No acute osseous abnormalities are seen.  IMPRESSION: Vascular congestion; new increased interstitial markings raise concern for mild new pulmonary edema.  Original Report Authenticated By: Tonia Ghent, M.D.    Medications: I have reviewed the patient's current medications.  CHF (congestive heart failure) -systolic  Patient's most recent EF is 30-35%  Change IV lasix to oral. The patient is noncompliant with care or medication  Baby aspirin daily . Continue with lisinopril.  Leukocytosis  / Bronchitis.  Will follow  blood cultures and urine cultures  Continue with Levaquin day 2.  .Tobacco abuse  Nicotine patch:  .COPD (chronic obstructive pulmonary disease): Stable   .Anemia due to chronic illness  Stable resume home medication       LOS: 2 days   Nico Syme M.D.  [facility] 04/11/2011, 3:20 PM

## 2011-04-11 NOTE — Progress Notes (Signed)
ED CM sent referral to [facility] for assist with [facility] services.  [facility] Coordinator found out pt not eligible for uninsured program because listed as medicare. Pt still eligible to go to 04/15/11 appt and does not need an eligibility appt for screening. He only needs to bring in his medicare card.

## 2011-04-11 NOTE — Progress Notes (Signed)
UR CHART REVIEWED; B Khilynn Borntreger RN, BSN, MHA 

## 2011-04-11 NOTE — Progress Notes (Signed)
04/11/11 1825 patient had a short run of SVT. MD was notified. New orders as per Mg.

## 2011-04-11 NOTE — Progress Notes (Signed)
Patient had several runs of V-tach at 01:20 am.  A 4 beat run, then a 3 beat run, followed by a 6 beat run.  Pt was asleep, VSS, and no c/o chest or shortness of breath.  MD on call was notified.  No new orders at this time.  Will continue to monitor patient.  Manson Passey, Zaeda Mcferran Cherie

## 2011-04-12 MED ORDER — PANTOPRAZOLE SODIUM 40 MG PO TBEC
40.0000 mg | DELAYED_RELEASE_TABLET | Freq: Every day | ORAL | Status: DC
  Administered 2011-04-12 – 2011-04-16 (×5): 40 mg via ORAL
  Filled 2011-04-12 (×5): qty 1

## 2011-04-12 NOTE — Evaluation (Signed)
Physical Therapy Evaluation Patient Details Name: Dale Mcfarland MRN: [id number] DOB: 08/03/1929 Today's Date: 04/12/2011  Problem List:  Patient Active Problem List  Diagnoses  . Pneumonia  . Upper GI bleeding  . Alcoholism  . Leukocytosis  . Anemia associated with acute blood loss  . Dehydration  . Syncope  . CHF (congestive heart failure)  . COPD (chronic obstructive pulmonary disease)  . Tobacco abuse  . Anemia due to chronic illness  . Shortness of breath    Past Medical History:  Past Medical History  Diagnosis Date  . COPD (chronic obstructive pulmonary disease)   . Chronic back pain   . Pneumonia     01/2011 - CAP vs aspiration pneuonmia  . Depression   . PONV (postoperative nausea and vomiting)   . Hyponatremia     Previously felt secondary to SIADH  . Hypertension   . Upper GI bleed     01/2011 with EGD showing severe candida esophagitis and duodenal bulb erosion  . Anemia     In the setting of UGI bleed 01/2011 requiring blood transfusion  . Homelessness    Past Surgical History:  Past Surgical History  Procedure Date  . Tonsillectomy   . Vasectomy   . Appendectomy   . Tonsillectomy   . Colonoscopy   . Esophagogastroduodenoscopy   . Esophagogastroduodenoscopy 02/03/2011    Procedure: ESOPHAGOGASTRODUODENOSCOPY (EGD);  Surgeon: Charna Elizabeth, MD;  Location: [facility];  Service: Endoscopy;  Laterality: N/A;  . Esophagogastroduodenoscopy 02/03/2011    Procedure: ESOPHAGOGASTRODUODENOSCOPY (EGD);  Surgeon: Charna Elizabeth, MD;  Location: [facility];  Service: Endoscopy;  Laterality: N/A;    PT Assessment/Plan/Recommendation PT Assessment Clinical Impression Statement: Pt presents with diagnosis of CHF. Pt c/o history of passing out with ambulation and SOB with activity ("I can't walk 90 feet without needing to sit down."). Did not note any SOB during eval. No c/o dizziness. Recommended pt use cane for increased stability to possibly improve gait pattern. Pt  stated he does not like to use canes "they throw you off...make you fall." Pt states he has a "staff" he uses PRN. Recommended pt ambulate with nursing supervision today and over weekend if still a patient.  PT Recommendation/Assessment: Patient will need skilled PT in the acute care venue PT Problem List: Decreased balance;Decreased activity tolerance;Decreased mobility PT Therapy Diagnosis : Difficulty walking PT Plan PT Frequency: Min 3X/week PT Treatment/Interventions: DME instruction;Gait training;Functional mobility training;Therapeutic activities;Patient/family education;Balance training PT Recommendation Recommendations for Other Services: OT consult Follow Up Recommendations: No PT follow up Equipment Recommended: None recommended by PT (pt declines use of cane) PT Goals  Acute Rehab PT Goals PT Goal Formulation: With patient Time For Goal Achievement: 7 days Pt will Ambulate: >150 feet;with modified independence;with least restrictive assistive device PT Goal: Ambulate - Progress: Goal set today Additional Goals Additional Goal #1: Pt will demonstate good balance with functional activities without use of assistive device PT Goal: Additional Goal #1 - Progress: Goal set today  PT Evaluation Precautions/Restrictions  Precautions Precautions: Fall Prior Functioning  Home Living Lives With: Alone Type of Home: Homeless (pt states he lives at camp site) Prior Function Level of Independence: Independent with gait;Requires assistive device for independence (occasionally uses "staff" to hold onto) Cognition Cognition Arousal/Alertness: Awake/Mcfarland Overall Cognitive Status: Appears within functional limits for tasks assessed Orientation Level: Oriented to person;Oriented to place;Oriented to situation Sensation/Coordination Sensation Light Touch: Appears Intact Coordination Gross Motor Movements are Fluid and Coordinated: Yes Extremity Assessment   Mobility (including  Balance) Bed Mobility Bed Mobility: Yes Supine to Sit: 6: Modified independent (Device/Increase time) Sit to Supine: 6: Modified independent (Device/Increase time) Transfers Transfers: Yes Sit to Stand: 6: Modified independent (Device/Increase time) Sit to Stand Details (indicate cue type and reason): Increased time to rise.  Stand to Sit: 6: Modified independent (Device/Increase time) Ambulation/Gait Ambulation/Gait: Yes Ambulation/Gait Assistance: 5: Supervision Ambulation/Gait Assistance Details (indicate cue type and reason): VCs safety. Vitals: 96% RA, 98bpm during ambulation. No c/o dizziness, but pt c/o some SOB (not noticeable by PT). Pt able to converse during entire walk. No rest break needed.  Ambulation Distance (Feet): 150 Feet Assistive device: None Gait Pattern: Step-through pattern;Shuffle  Posture/Postural Control Posture/Postural Control: No significant limitations Balance Balance Assessed:  (No LOB with activity but pt demonstrates shuffle gait pattern and states he has history of falling-"I creep") Will need to further assess balance on next visit Exercise    End of Session PT - End of Session Equipment Utilized During Treatment: Gait belt Activity Tolerance: Patient tolerated treatment well Patient left: in bed;with call bell in reach Nurse Communication: Mobility status for ambulation General Behavior During Session: [facility] for tasks performed Cognition: [facility] for tasks performed  Dale Mcfarland [facility] 04/12/2011, 2:36 PM [phone]

## 2011-04-12 NOTE — Progress Notes (Signed)
04/12/11 0843 Vitals signs were taken prior to medication administration they were: HR 76; B/P 82/44 (manual). The MD was notified. MD requested a repeat B/P. The repeat B/P at 0900 was 91/55 (dinamap). This B/P was relayed to the MD. New orders were given.

## 2011-04-12 NOTE — Progress Notes (Signed)
OT Screen Order received, chart reviewed. Spoke briefly with pt who states that he has no trouble with any of his ADLs. Pt fully dressed upon OTs arrival although disheveled. Pt presents with no OT needs at this time. Will sign off.  Garrel Ridgel, OTR/L  Pager [phone] 04/12/2011

## 2011-04-12 NOTE — Progress Notes (Signed)
Patient pulled out his IV saline lock stating that "it hurts".  Explained to patient the importance of having IV access but patient refused to have IV restarted.  Patient also pulled off his Telemetry monitor stating that he "can't sleep" with it on and refused to have it placed back on.  Notified the MD on call of both of the above situations.  Will continue to monitor patient. Manson Passey, Zenda Herskowitz Cherie

## 2011-04-12 NOTE — Progress Notes (Addendum)
Subjective: Patient relates that he vomit last night continues, wants to eat breakfast. He removed IV access. I explain to him importance of iv access.  He is complaining of some abdominal pain.  Objective: Filed Vitals:   04/11/11 2209 04/12/11 0003 04/12/11 0507 04/12/11 0843  BP: 69/46 97/61 81/48  82/44  Pulse: 96 102 88 76  Temp: 98.4 F (36.9 C)  98.1 F (36.7 C)   TempSrc: Oral  Oral   Resp: 19  16   Height:      Weight:   61.1 kg (134 lb 11.2 oz)   SpO2: 96%  98%    Weight change: 11.204 kg (24 lb 11.2 oz)  Intake/Output Summary (Last 24 hours) at 04/12/11 0855 Last data filed at 04/11/11 1800  Gross per 24 hour  Intake    820 ml  Output    900 ml  Net    -80 ml    General: Alert, awake, oriented x3, in no acute distress.  HEENT: No bruits, no goiter.  Heart: Regular rate and rhythm, without murmurs, rubs, gallops.  Lungs: Crackles bl, bilateral air movement.  Abdomen: Soft, nontender, nondistended, positive bowel sounds.  Neuro: Grossly intact, nonfocal. Extremities; no edema.   Lab Results:  Basename 04/11/11 1910 04/11/11 0511 04/10/11 0427 04/10/11 0415  NA -- 134* 138 --  K -- 3.8 4.0 --  CL -- 99 109 --  CO2 -- 24 -- 23  GLUCOSE -- 101* 99 --  BUN -- 40* 26* --  CREATININE -- 1.35 0.90 --  CALCIUM -- 9.2 -- 9.3  MG 2.0 -- -- --  PHOS -- -- -- --   Basename 04/11/11 0511 04/10/11 0427 04/10/11 0415  WBC 15.6* -- 15.9*  NEUTROABS -- -- 12.6*  HGB 9.7* 10.5* --  HCT 30.7* 31.0* --  MCV 81.4 -- 81.3  PLT 439* -- 490*    Micro Results: Recent Results (from the past 240 hour(s))  CULTURE, BLOOD (ROUTINE X 2)     Status: Normal (Preliminary result)   Collection Time   04/10/11  6:35 AM      Component Value Range Status Comment   Specimen Description BLOOD LEFT ARM   Final    Special Requests BOTTLES DRAWN AEROBIC AND ANAEROBIC 6 CC EACH   Final    Culture  Setup Time [id number]   Final    Culture     Final    Value:        BLOOD CULTURE  RECEIVED NO GROWTH TO DATE CULTURE WILL BE HELD FOR 5 DAYS BEFORE ISSUING A FINAL NEGATIVE REPORT   Report Status PENDING   Incomplete   CULTURE, BLOOD (ROUTINE X 2)     Status: Normal (Preliminary result)   Collection Time   04/10/11  7:08 AM      Component Value Range Status Comment   Specimen Description BLOOD RIGHT ANTECUBITAL   Final    Special Requests BOTTLES DRAWN AEROBIC ONLY 10 CC   Final    Culture  Setup Time [id number]   Final    Culture     Final    Value:        BLOOD CULTURE RECEIVED NO GROWTH TO DATE CULTURE WILL BE HELD FOR 5 DAYS BEFORE ISSUING A FINAL NEGATIVE REPORT   Report Status PENDING   Incomplete     Studies/Results: No results found.  Medications: I have reviewed the patient's current medications.  CHF (congestive heart failure) -systolic  Patient's most recent EF is  30-35%  Change IV lasix to oral.  The patient is noncompliant with care or medication  Baby aspirin daily .  I will hold lasix, coreg, lisinopril due to Soft SBP.   Leukocytosis  Bronchitis.  Will follow blood cultures and urine cultures  Continue with Levaquin day 3.  .Tobacco abuse Nicotine patch:  .COPD (chronic obstructive pulmonary disease): Stable  .Anemia due to chronic illness  Hypotension: hold lisinopril, coreg, lasix. Repeated BP 90. Monitor.  Gastritis; will start Protonix.       LOS: 3 days   Adalay Azucena M.D.  [facility] 04/12/2011, 8:55 AM

## 2011-04-13 LAB — CBC
MCH: 25.1 pg — ABNORMAL LOW (ref 26.0–34.0)
MCHC: 31.3 g/dL (ref 30.0–36.0)
Platelets: 382 10*3/uL (ref 150–400)
RDW: 16.6 % — ABNORMAL HIGH (ref 11.5–15.5)

## 2011-04-13 LAB — BASIC METABOLIC PANEL
Calcium: 8.9 mg/dL (ref 8.4–10.5)
GFR calc Af Amer: 42 mL/min — ABNORMAL LOW (ref >90)
GFR calc non Af Amer: 36 mL/min — ABNORMAL LOW (ref >90)
Sodium: 129 mEq/L — ABNORMAL LOW (ref 135–145)

## 2011-04-13 MED ORDER — LEVOFLOXACIN 250 MG PO TABS
250.0000 mg | ORAL_TABLET | Freq: Every day | ORAL | Status: DC
  Administered 2011-04-14 – 2011-04-16 (×3): 250 mg via ORAL
  Filled 2011-04-13 (×3): qty 1

## 2011-04-13 NOTE — Progress Notes (Signed)
Subjective: Patient relates breathing better. SOB on exertion improved.  Objective: Filed Vitals:   04/12/11 0900 04/12/11 1400 04/12/11 2048 04/13/11 0519  BP: 91/55 93/58 122/66 97/62  Pulse: 89 86 88 83  Temp:  97.6 F (36.4 C) 98.6 F (37 C) 97.7 F (36.5 C)  TempSrc:  Oral Oral Oral  Resp:  18 18 18   Height:      Weight:    61.4 kg (135 lb 5.8 oz)  SpO2:   99% 97%   Weight change: 0.3 kg (10.6 oz)  Intake/Output Summary (Last 24 hours) at 04/13/11 1405 Last data filed at 04/13/11 1100  Gross per 24 hour  Intake    360 ml  Output    600 ml  Net   -240 ml    General: Alert, awake, oriented x3, in no acute distress.  HEENT: No bruits, no goiter.  Heart: Regular rate and rhythm, without murmurs, rubs, gallops.  Lungs: bilateral ronchus, bilateral air movement.  Abdomen: Soft, nontender, nondistended, positive bowel sounds.  Extremities; no edema.    Lab Results:  Basename 04/13/11 0531 04/11/11 1910 04/11/11 0511  NA 129* -- 134*  K 3.8 -- 3.8  CL 96 -- 99  CO2 25 -- 24  GLUCOSE 87 -- 101*  BUN 56* -- 40*  CREATININE 1.72* -- 1.35  CALCIUM 8.9 -- 9.2  MG -- 2.0 --  PHOS -- -- --    Basename 04/13/11 0531 04/11/11 0511  WBC 10.2 15.6*  NEUTROABS -- --  HGB 9.5* 9.7*  HCT 30.4* 30.7*  MCV 80.4 81.4  PLT 382 439*    Micro Results: Recent Results (from the past 240 hour(s))  CULTURE, BLOOD (ROUTINE X 2)     Status: Normal (Preliminary result)   Collection Time   04/10/11  6:35 AM      Component Value Range Status Comment   Specimen Description BLOOD LEFT ARM   Final    Special Requests BOTTLES DRAWN AEROBIC AND ANAEROBIC 6 CC EACH   Final    Culture  Setup Time [id number]   Final    Culture     Final    Value:        BLOOD CULTURE RECEIVED NO GROWTH TO DATE CULTURE WILL BE HELD FOR 5 DAYS BEFORE ISSUING A FINAL NEGATIVE REPORT   Report Status PENDING   Incomplete   CULTURE, BLOOD (ROUTINE X 2)     Status: Normal (Preliminary result)   Collection  Time   04/10/11  7:08 AM      Component Value Range Status Comment   Specimen Description BLOOD RIGHT ANTECUBITAL   Final    Special Requests BOTTLES DRAWN AEROBIC ONLY 10 CC   Final    Culture  Setup Time [id number]   Final    Culture     Final    Value:        BLOOD CULTURE RECEIVED NO GROWTH TO DATE CULTURE WILL BE HELD FOR 5 DAYS BEFORE ISSUING A FINAL NEGATIVE REPORT   Report Status PENDING   Incomplete     Studies/Results: No results found.  Medications: I have reviewed the patient's current medications.  Hyponatremia; ? Dehydration ? Holding lasix.   Acute renal failure; pre renal, ATN. Setting hypotension, over diuresis, ACE. Holding lasix, ACE. Repeat B-met AM. Hold on IV fluids due to HF.   Acute CHF (congestive heart failure) -systolic  Appears compensated.  Patient's most recent EF is 30-35%  Lasix on hold due to hypotension  and worsening renal function.  The patient is noncompliant with care or medication  Baby aspirin daily .  hold lasix, coreg, lisinopril due to Soft SBP.   Leukocytosis  Bronchitis.  blood cultures no growth to date.  Continue with Levaquin day 4.  Tobacco abuse Nicotine patch:  COPD (chronic obstructive pulmonary disease): Stable  Anemia due to chronic illness  Hypotension: hold lisinopril, coreg, lasix. BP stable.  Gastritis; continue with  Protonix.         LOS: 4 days   Aarron Wierzbicki M.D.  [facility] 04/13/2011, 2:05 PM

## 2011-04-14 LAB — CBC
MCH: 25.7 pg — ABNORMAL LOW (ref 26.0–34.0)
Platelets: 346 10*3/uL (ref 150–400)
RBC: 3.62 MIL/uL — ABNORMAL LOW (ref 4.22–5.81)
WBC: 7.6 10*3/uL (ref 4.0–10.5)

## 2011-04-14 LAB — BASIC METABOLIC PANEL
CO2: 27 mEq/L (ref 19–32)
Calcium: 9.1 mg/dL (ref 8.4–10.5)
Chloride: 98 mEq/L (ref 96–112)
Potassium: 4.2 mEq/L (ref 3.5–5.1)
Sodium: 131 mEq/L — ABNORMAL LOW (ref 135–145)

## 2011-04-14 NOTE — Progress Notes (Addendum)
Subjective: Breathing better.  Objective: Filed Vitals:   04/13/11 0519 04/13/11 1441 04/13/11 2023 04/14/11 0550  BP: 97/62 134/56 110/69 121/68  Pulse: 83 81 79 78  Temp: 97.7 F (36.5 C) 97.4 F (36.3 C) 98.2 F (36.8 C) 98.3 F (36.8 C)  TempSrc: Oral Oral Oral Oral  Resp: 18 19 20 18   Height:      Weight: 61.4 kg (135 lb 5.8 oz)   60.9 kg (134 lb 4.2 oz)  SpO2: 97% 96% 97% 96%   Weight change: -0.5 kg (-1 lb 1.6 oz)  Intake/Output Summary (Last 24 hours) at 04/14/11 0932 Last data filed at 04/14/11 0500  Gross per 24 hour  Intake    720 ml  Output    500 ml  Net    220 ml    General: Alert, awake, oriented x3, in no acute distress.  HEENT: No bruits, no goiter.  Heart: Regular rate and rhythm, without murmurs, rubs, gallops.  Lungs: bilateral ronchus,, bilateral air movement.  Abdomen: Soft, nontender, nondistended, positive bowel sounds.  Neuro: Grossly intact, nonfocal. Extremities; no edema  Lab Results:  Basename 04/14/11 0522 04/13/11 0531 04/11/11 1910  NA 131* 129* --  K 4.2 3.8 --  CL 98 96 --  CO2 27 25 --  GLUCOSE 92 87 --  BUN 45* 56* --  CREATININE 1.49* 1.72* --  CALCIUM 9.1 8.9 --  MG -- -- 2.0  PHOS -- -- --    Basename 04/14/11 0522 04/13/11 0531  WBC 7.6 10.2  NEUTROABS -- --  HGB 9.3* 9.5*  HCT 29.1* 30.4*  MCV 80.4 80.4  PLT 346 382   Micro Results: Recent Results (from the past 240 hour(s))  CULTURE, BLOOD (ROUTINE X 2)     Status: Normal (Preliminary result)   Collection Time   04/10/11  6:35 AM      Component Value Range Status Comment   Specimen Description BLOOD LEFT ARM   Final    Special Requests BOTTLES DRAWN AEROBIC AND ANAEROBIC 6 CC EACH   Final    Culture  Setup Time [id number]   Final    Culture     Final    Value:        BLOOD CULTURE RECEIVED NO GROWTH TO DATE CULTURE WILL BE HELD FOR 5 DAYS BEFORE ISSUING A FINAL NEGATIVE REPORT   Report Status PENDING   Incomplete   CULTURE, BLOOD (ROUTINE X 2)      Status: Normal (Preliminary result)   Collection Time   04/10/11  7:08 AM      Component Value Range Status Comment   Specimen Description BLOOD RIGHT ANTECUBITAL   Final    Special Requests BOTTLES DRAWN AEROBIC ONLY 10 CC   Final    Culture  Setup Time [id number]   Final    Culture     Final    Value:        BLOOD CULTURE RECEIVED NO GROWTH TO DATE CULTURE WILL BE HELD FOR 5 DAYS BEFORE ISSUING A FINAL NEGATIVE REPORT   Report Status PENDING   Incomplete     Studies/Results: No results found.  Medications: I have reviewed the patient's current medications.  Hyponatremia; ? Dehydration ? Holding lasix. Improved.  Acute renal failure; pre renal, ATN. Setting hypotension, over diuresis, ACE.  Holding lasix, ACE. Repeat B-met AM. Hold on IV fluids due to HF.   Acute CHF (congestive heart failure) -systolic  Appears compensated.  Patient's most recent EF is 30-35%  Lasix on hold due to hypotension and worsening renal function.  The patient is noncompliant with care or medication  Baby aspirin daily .  hold lasix, coreg, lisinopril due to Soft SBP.   Leukocytosis  Bronchitis.  blood cultures no growth to date.  Continue with Levaquin day 5. Tobacco abuse Nicotine patch:  COPD (chronic obstructive pulmonary disease): Stable  Anemia due to chronic illness  Hypotension: resolved. Holding  lisinopril, coreg, lasix due to worsening renal function. Gastritis; continue with Protonix.       LOS: 5 days   Durand Wittmeyer M.D.  [facility] 04/14/2011, 9:32 AM  I will consider restart lasix tomorrow if renal function stable.

## 2011-04-15 MED ORDER — FUROSEMIDE 20 MG PO TABS
20.0000 mg | ORAL_TABLET | Freq: Every day | ORAL | Status: DC
  Administered 2011-04-15 – 2011-04-17 (×3): 20 mg via ORAL
  Filled 2011-04-15 (×3): qty 1

## 2011-04-15 NOTE — Discharge Instructions (Signed)
[facility]pt [facility] rescheduled for May 30, 2011 with Dr Venetia Night at 9:15.

## 2011-04-15 NOTE — Progress Notes (Signed)
Patient was scheduled to go to [facility] today but he is still hospitalized; apt rescheduled for  May 30, 2011 at 9:15 with Dr Venetia Night . Abelino Derrick RN, BSN, [employer]

## 2011-04-15 NOTE — Progress Notes (Signed)
Subjective: Still coughing, SOB better. He went out side. Explain to him that he can not leave the hospital.  Objective: Filed Vitals:   04/14/11 0550 04/14/11 1348 04/14/11 2053 04/15/11 0622  BP: 121/68 135/74 127/68 121/74  Pulse: 78 73 80 79  Temp: 98.3 F (36.8 C) 97.6 F (36.4 C) 97.8 F (36.6 C) 98.1 F (36.7 C)  TempSrc: Oral Oral Oral Oral  Resp: 18 18 18 18   Height:      Weight: 60.9 kg (134 lb 4.2 oz)   60.3 kg (132 lb 15 oz)  SpO2: 96% 100% 97% 94%   Weight change: -0.6 kg (-1 lb 5.2 oz)  Intake/Output Summary (Last 24 hours) at 04/15/11 1640 Last data filed at 04/15/11 0912  Gross per 24 hour  Intake    240 ml  Output      0 ml  Net    240 ml    General: Alert, awake, oriented x3, in no acute distress.  HEENT: No bruits, no goiter.  Heart: Regular rate and rhythm, without murmurs, rubs, gallops.  Lungs: Crackles bases, bilateral air movement.  Abdomen: Soft, nontender, nondistended, positive bowel sounds.  Neuro: Grossly intact, nonfocal. Extremities; trace edema.   Lab Results:  Basename 04/14/11 0522 04/13/11 0531  NA 131* 129*  K 4.2 3.8  CL 98 96  CO2 27 25  GLUCOSE 92 87  BUN 45* 56*  CREATININE 1.49* 1.72*  CALCIUM 9.1 8.9  MG -- --  PHOS -- --    Basename 04/14/11 0522 04/13/11 0531  WBC 7.6 10.2  NEUTROABS -- --  HGB 9.3* 9.5*  HCT 29.1* 30.4*  MCV 80.4 80.4  PLT 346 382    Micro Results: Recent Results (from the past 240 hour(s))  CULTURE, BLOOD (ROUTINE X 2)     Status: Normal (Preliminary result)   Collection Time   04/10/11  6:35 AM      Component Value Range Status Comment   Specimen Description BLOOD LEFT ARM   Final    Special Requests BOTTLES DRAWN AEROBIC AND ANAEROBIC 6 CC EACH   Final    Culture  Setup Time [id number]   Final    Culture     Final    Value:        BLOOD CULTURE RECEIVED NO GROWTH TO DATE CULTURE WILL BE HELD FOR 5 DAYS BEFORE ISSUING A FINAL NEGATIVE REPORT   Report Status PENDING   Incomplete     CULTURE, BLOOD (ROUTINE X 2)     Status: Normal (Preliminary result)   Collection Time   04/10/11  7:08 AM      Component Value Range Status Comment   Specimen Description BLOOD RIGHT ANTECUBITAL   Final    Special Requests BOTTLES DRAWN AEROBIC ONLY 10 CC   Final    Culture  Setup Time [id number]   Final    Culture     Final    Value:        BLOOD CULTURE RECEIVED NO GROWTH TO DATE CULTURE WILL BE HELD FOR 5 DAYS BEFORE ISSUING A FINAL NEGATIVE REPORT   Report Status PENDING   Incomplete     Studies/Results: No results found.  Medications: I have reviewed the patient's current medications.  Hyponatremia; over diuresis. Sodium improved.  Acute renal failure; pre renal, ATN. Setting hypotension, over diuresis, ACE.  Holding  ACE. Repeat B-met AM. Hold on IV fluids due to HF. Restart low dose lasix. If renal function stable tomorrow will discharge  him.  Acute CHF (congestive heart failure) -systolic  Appears compensated.  Patient's most recent EF is 30-35%  Restart low dose lasix. The patient is noncompliant with care or medication  Baby aspirin daily .  hold  coreg, lisinopril due to Soft SBP.  Leukocytosis  Bronchitis.  blood cultures no growth to date.  Continue with Levaquin day 6. Tobacco abuse Nicotine patch. COPD (chronic obstructive pulmonary disease): Stable  Anemia due to chronic illness  Hypotension: resolved. Holding lisinopril, coreg,  due to worsening renal function.  Gastritis; continue with Protonix.       LOS: 6 days   Glyn Zendejas M.D.  [facility] 04/15/2011, 4:40 PM

## 2011-04-15 NOTE — Progress Notes (Signed)
Pt not in room upon PT arrival.  Spoke with RN who states that pt is missing and that he has done this before.  Pts coat still in room, therefore he may return.  Will check back as schedule permits. Thanks, Clovia Cuff, PT

## 2011-04-15 NOTE — Progress Notes (Signed)
Patient left unit without notifying staff.  Security was notified.  Patient was found by cafeteria by staff.  Educated patient about the importance of staying on the unit per MD order.  Patient said that he was "[place] go as he pleases" Will continue to monitor.

## 2011-04-16 LAB — BASIC METABOLIC PANEL
GFR calc non Af Amer: 63 mL/min — ABNORMAL LOW (ref >90)
Glucose, Bld: 67 mg/dL — ABNORMAL LOW (ref 70–99)
Potassium: 4.8 mEq/L (ref 3.5–5.1)
Sodium: 132 mEq/L — ABNORMAL LOW (ref 135–145)

## 2011-04-16 LAB — CULTURE, BLOOD (ROUTINE X 2)
Culture  Setup Time: 201302271020
Culture: NO GROWTH

## 2011-04-16 MED ORDER — LISINOPRIL 2.5 MG PO TABS
2.5000 mg | ORAL_TABLET | Freq: Every day | ORAL | Status: DC
  Administered 2011-04-16 – 2011-04-17 (×2): 2.5 mg via ORAL
  Filled 2011-04-16 (×2): qty 1

## 2011-04-16 NOTE — Progress Notes (Signed)
Admit date: 04/09/2011 Discharge date: 04/16/2011  Primary Care Physician:  Georganna Skeans, MD, MD   Discharge Diagnoses:    . Acute Systolic CHF exacerbation 03/28/2011   . Bronchitis 03/28/2011   . Tobacco abuse 03/28/2011   . COPD (chronic obstructive pulmonary disease) 03/28/2011   . Anemia due to chronic illness 03/28/2011       Hyponatremia      Acute renal Failure.        Hypotension         DISCHARGE MEDICATION: To be determine at time of discharge.    Consults:  none   SIGNIFICANT DIAGNOSTIC STUDIES:  Dg Chest 2 View  04/10/2011  *RADIOLOGY REPORT*  Clinical Data: Shortness of breath and nausea.  CHEST - 2 VIEW  Comparison: Chest radiograph performed 03/31/2011  Findings: The lungs are well-aerated.  Vascular congestion is noted.  Increased interstitial markings raise question for mild pulmonary edema, worsened from the prior study.  No pleural effusion or pneumothorax is seen.  The heart is borderline normal in size; the mediastinal contour is within normal limits.  No acute osseous abnormalities are seen.  IMPRESSION: Vascular congestion; new increased interstitial markings raise concern for mild new pulmonary edema.  Original Report Authenticated By: Tonia Ghent, M.D.   Dg Chest 2 View  03/31/2011  *RADIOLOGY REPORT*  Clinical Data: Weakness, near-syncope.  CHEST - 2 VIEW  Comparison: 03/27/2011  Findings: Heart size upper limits normal.  Central pulmonary vascular congestion and mild interstitial edema, probably slightly improved since the previous study.  No focal airspace consolidation.  No effusion.   Old displaced left clavicle fracture.  IMPRESSION:  1.  Slight interval improvement in bilateral interstitial edema.  Original Report Authenticated By: Osa Craver, M.D.   Dg Chest Port 1 View  03/27/2011  *RADIOLOGY REPORT*  Clinical Data: Nausea.  PORTABLE CHEST - 1 VIEW  Comparison: 03/06/2011  Findings: Technically limited study due to patient rotation.  The  heart size and pulmonary vascularity appear prominent suggesting mild vascular congestion.  The interstitial changes suggesting mild edema.  No focal airspace consolidation.  No blunting of costophrenic angles.  No evidence of pneumothorax.  Healing bilateral rib fractures.  Old ununited displaced left clavicular fracture.  IMPRESSION: Mild cardiac enlargement and pulmonary vascular congestion with slight interstitial edema.  Old post-traumatic changes.  Original Report Authenticated By: Marlon Pel, M.D.      Recent Results (from the past 240 hour(s))  CULTURE, BLOOD (ROUTINE X 2)     Status: Normal   Collection Time   04/10/11  6:35 AM      Component Value Range Status Comment   Specimen Description BLOOD LEFT ARM   Final    Special Requests BOTTLES DRAWN AEROBIC AND ANAEROBIC 6 CC EACH   Final    Culture  Setup Time [id number]   Final    Culture NO GROWTH 5 DAYS   Final    Report Status 04/16/2011 FINAL   Final   CULTURE, BLOOD (ROUTINE X 2)     Status: Normal   Collection Time   04/10/11  7:08 AM      Component Value Range Status Comment   Specimen Description BLOOD RIGHT ANTECUBITAL   Final    Special Requests BOTTLES DRAWN AEROBIC ONLY 10 CC   Final    Culture  Setup Time [id number]   Final    Culture NO GROWTH 5 DAYS   Final    Report Status 04/16/2011 FINAL   Final  BRIEF ADMITTING H & P: This is a 78 year old gentleman who is homeless. He was recently admitted here at 03/27/2011 for congestive heart failure. He had the 2-D echo which relieved with 30-35%. He apparently left AMA. The patient represents with complaints of ongoing shortness of breath. He states he has shortness of breath after walking a few yards. He reports no lower extremity edema. He reports no chest pain,some possible palpitations. He states he is noncompliant with medication, stating 'I cannot afford them'. Reports some mild wheezing, he does smoke tobacco and drinks alcohol. He states he does get  nauseous and vomiting occasionally. No history of variceal bleeding. Patient is homeless.  Hospital Course:  Hyponatremia; over diuresis. Sodium improved. Patient was restarted on low dose lasix.   Acute renal failure; pre renal, ATN. Setting hypotension, over diuresis. Repeat B-met in am, patient will be started on low dose ACE.  Cr stable after lasix restarted. His Cr peak to 1.7. I think patient will benefit of been on lisinopril due Ef at 30 %. If renal function get worse tomorrow will stop lisinopril.  Acute CHF (congestive heart failure) -systolic  Patient was admitted with SOB, was not taking medications. Now Appears compensated. He was initially treated with 40 mg IV lasix subsequently change to oral. Lasix was decrease to 20 mg daily. He has been tolerating this dose.  Patient's most recent EF is 30-35%. Baby aspirin daily. I will hold on coreg  to avoid multiple medications that could cause hypotension.   Leukocytosis  Secondary to Bronchitis.  Blood cultures no growth to date. Received Levaquin for 7 days.  Tobacco abuse Nicotine patch. COPD (chronic obstructive pulmonary disease): Stable  Anemia due to chronic illness  Hypotension: resolved. We discontinue Coreg, lisinopril 10 mg, Lasix. Blood pressure  increase. Monitor on lasix and low dose lisinopril.  Gastritis; Continue with Protonix.  Social Issues: Patient relates that he does not have his check with him. His friend who has his check will be here tomorrow.  Plan for discharge tomorrow if renal function stable.    Disposition and Follow-up: Need a B-met within 1 week.   Follow-up Information    Follow up with Georganna Skeans, MD on 04/15/2011. ([facility] appointment on Monday 04/15/11 at 2:30 pm with Dr Andrey Campanile)    Contact information:   [address] [place] [address] [phone]           DISCHARGE EXAM:   General: Alert, awake, oriented x3, in no acute distress.  HEENT: No bruits, no  goiter.  Heart: Regular rate and rhythm, without murmurs, rubs, gallops.  Lungs: Crackles bases, bilateral air movement.  Abdomen: Soft, nontender, nondistended, positive bowel sounds.  Neuro: Grossly intact, nonfocal.  Extremities; trace edema.    Blood pressure 103/69, pulse 90, temperature 98.5 F (36.9 C), temperature source Oral, resp. rate 18, height 5\' 7"  (1.702 m), weight 61.508 kg (135 lb 9.6 oz), SpO2 100.00%.   Basename 04/16/11 0915 04/14/11 0522  NA 132* 131*  K 4.8 4.2  CL 98 98  CO2 26 27  GLUCOSE 67* 92  BUN 32* 45*  CREATININE 1.09 1.49*  CALCIUM 9.4 9.1  MG -- --  PHOS -- --    Basename 04/14/11 0522  WBC 7.6  NEUTROABS --  HGB 9.3*  HCT 29.1*  MCV 80.4  PLT 346    Signed: Pauline Pegues M.D. 04/16/2011, 2:33 PM

## 2011-04-16 NOTE — Progress Notes (Signed)
CSW met with patient and patient care nurse, Connye Burkitt, at bedside. CSW gave patient list of local homeless shelters. Patient states that he has already been kicked out of [facility] so he cant go back there. He does not want to go to [place] [facility] as he says he has two ex wives in [place] and lived there for years. CSW asked what he would like Child psychotherapist to do for him. He states that he is going to stay in this hospital because hospital laws state that he would have had to be notified by 920am if he was going to be discharged today. CSW communicated that CSW is unaware of any such law but that if the doctor clears him for discharge then we need to have a plan in place. He states that he lives in a tent but cant go to a tent in this weather. CSW again offered shelters and gave list but he was not receptive to any of this information. He continues to say that he is staying in the hospital. CSW explained that medically he has no need to be in the hospital. He states that has nothing to do with it. CSW explained that the hospital is for people being medically treated and we can not keep him due to weather. CSW has offered him resources and he continues to decline. CSW states that when he is cleared for discharge CSW will be happy to give him a bus pass. Patient then became verbally combative with patient care nurse, Connye Burkitt and started threatening to sue if he was discharged. Patient then stated that CSW has given him what CSW came in to give him and can now leave. CSW called MD and communicated same.  Jonasia Coiner C. Bayard More MSW, LCSW [phone]

## 2011-04-16 NOTE — Progress Notes (Signed)
Physical Therapy Note  Per nursing staff, pt has been ambulating unassisted throughout hospital (also noted events on yesterday). Do not feel pt has any further acute PT needs at this time. Will sign off. Thanks.    [name], PT [phone]

## 2011-04-17 LAB — BASIC METABOLIC PANEL
CO2: 24 mEq/L (ref 19–32)
Chloride: 100 mEq/L (ref 96–112)
Glucose, Bld: 90 mg/dL (ref 70–99)
Potassium: 4 mEq/L (ref 3.5–5.1)
Sodium: 133 mEq/L — ABNORMAL LOW (ref 135–145)

## 2011-04-17 MED ORDER — LISINOPRIL 2.5 MG PO TABS: 2.5000 mg | ORAL_TABLET | Freq: Every day | ORAL | Status: DC

## 2011-04-17 MED ORDER — ASPIRIN 81 MG PO CHEW: 81.0000 mg | CHEWABLE_TABLET | Freq: Every day | ORAL | Status: DC

## 2011-04-17 MED ORDER — FUROSEMIDE 20 MG PO TABS: 20.0000 mg | ORAL_TABLET | Freq: Every day | ORAL | Status: DC

## 2011-04-17 NOTE — Discharge Summary (Signed)
1. Dyspnea on exertion: Resolved. Probably multifactorial including acute bronchitis and possible diastolic heart failure.  2. Questionable Systolic congestive heart failure: According to records initial 2-D echocardiogram in January demonstrated systolic heart failure and diffuse hypokinesis. However repeat echocardiogram February 15th demonstrates a normal left ventricular ejection fraction with no evidence of hypokinesis or wall motion abnormalities. Therefore at this time the patient has no evidence of systolic heart failure. I suspect the underlying etiology for his hypokinesis in the past with alcohol-related. He should continue on an aspirin. He will continue on low-dose ACE inhibitor. Of special note is that there was possible scarring on recent nuclear medicine stress imaging although was not definitive. The patient is unaware of any previous myocardial infarction and therefore at this point I do not suspect an ischemic cardiomyopathy and therefore do not think beta blocker is required.    3. Acute renal failure: Secondary to overdiuresis. Resolved.  4. Acute bronchitis: Treated with Levaquin.  5. Hyponatremia: Appears minimal, stable.  6. Anemia: Normocytic. Stable. Followup as an outpatient.  7. COPD:  8. Polysubstance abuse: Recommend cessation.  9. Homelessness: Has refused assistance.   Code Status: Full  Disposition Plan: Discharge today.  Has appointment with Dr. Andrey Campanile is also April 18 at 9:15 AM with  Dr. Venetia Night. Consultants:  Physical therapy: No needs identified.   Occupational therapy: No needs.  Procedures:  03/29/2011: 2-D echocardiogram: Left ventricular ejection fraction 55-60%. Normal wall motion. Physician Discharge Summary  Dale Mcfarland ZOX:[id number] DOB: 12-05-1929 DOA: 04/09/2011  PCP: Georganna Skeans, MD, MD  Admit date: 04/09/2011 Discharge date: 04/17/2011  Discharge Diagnoses:  1. Dyspnea on exertion, multifactorial, resolved 2. Acute bronchitis,  resolved 3. History of systolic congestive heart failure, currently no evidence for this 4. Possible diastolic congestive heart failure, acute, resolved 5. Acute renal failure, resolved 6. Minimal hyponatremia 7. Polysubstance abuse 8. Noncompliance  Discharge Condition: Improved  Disposition: Home  History of present illness:  78 year old man presented with shortness of breath. Admitted for congestive heart failure.  Hospital Course:  [name] was admitted to the medical floor and treated for possible diastolic heart failure as well as acute bronchitis. His condition improved and he is now stable for discharge. These and other issues as outlined below.  1. Dyspnea on exertion: Resolved. Probably multifactorial including acute bronchitis and possible diastolic heart failure.  2. Questionable Systolic congestive heart failure: According to records initial 2-D echocardiogram in January demonstrated systolic heart failure and diffuse hypokinesis. However repeat echocardiogram February 15th demonstrates a normal left ventricular ejection fraction with no evidence of hypokinesis or wall motion abnormalities. Therefore at this time the patient has no evidence of systolic heart failure. I suspect the underlying etiology for his hypokinesis in the past with alcohol-related. He should continue on an aspirin. He will continue on low-dose ACE inhibitor. Of special note is that there was possible scarring on recent nuclear medicine stress imaging although was not definitive. The patient is unaware of any previous myocardial infarction and therefore at this point I do not suspect an ischemic cardiomyopathy and therefore do not think beta blocker is required.    3. Acute renal failure: Secondary to overdiuresis. Resolved.  4. Acute bronchitis: Treated with Levaquin.  5. Hyponatremia: Appears minimal, stable.  6. Anemia: Normocytic. Stable. Followup as an outpatient.  7. COPD:  8. Polysubstance abuse:  Recommend cessation.  9. Homelessness: Has refused assistance.  Consultants:  Physical therapy: No needs identified.   Occupational therapy: No needs.  Procedures:  03/29/2011: 2-D  echocardiogram: Left ventricular ejection fraction 55-60%. Normal wall motion.  Discharge Instructions  Discharge Orders    Future Orders Please Complete By Expires   Diet - low sodium heart healthy      Activity as tolerated - No restrictions        Medication List  As of 04/17/2011 10:13 AM   TAKE these medications         aspirin 81 MG chewable tablet   Chew 1 tablet (81 mg total) by mouth daily.      furosemide 20 MG tablet   Commonly known as: LASIX   Take 1 tablet (20 mg total) by mouth daily.      lisinopril 2.5 MG tablet   Commonly known as: PRINIVIL,ZESTRIL   Take 1 tablet (2.5 mg total) by mouth daily.           Follow-up Information    Follow up with Jaclyn Shaggy, MD.   Contact information:   [address]  Pediatric Medicine [facility] [place] [address] [phone]       Follow up on 05/30/2011. (9:15)       Follow up with Robynn Pane, MD. Schedule an appointment as soon as possible for a visit in 2 weeks.   Contact information:   [address] [place] [address] [phone]           The results of significant diagnostics from this hospitalization (including imaging, microbiology, ancillary and laboratory) are listed below for reference.    Significant Diagnostic Studies: Dg Chest 2 View  04/10/2011  *RADIOLOGY REPORT*  Clinical Data: Shortness of breath and nausea.  CHEST - 2 VIEW  Comparison: Chest radiograph performed 03/31/2011  Findings: The lungs are well-aerated.  Vascular congestion is noted.  Increased interstitial markings raise question for mild pulmonary edema, worsened from the prior study.  No pleural effusion or pneumothorax is seen.  The heart is borderline normal in size; the mediastinal contour is  within normal limits.  No acute osseous abnormalities are seen.  IMPRESSION: Vascular congestion; new increased interstitial markings raise concern for mild new pulmonary edema.  Original Report Authenticated By: Tonia Ghent, M.D.   Dg Chest 2 View  03/31/2011  *RADIOLOGY REPORT*  Clinical Data: Weakness, near-syncope.  CHEST - 2 VIEW  Comparison: 03/27/2011  Findings: Heart size upper limits normal.  Central pulmonary vascular congestion and mild interstitial edema, probably slightly improved since the previous study.  No focal airspace consolidation.  No effusion.   Old displaced left clavicle fracture.  IMPRESSION:  1.  Slight interval improvement in bilateral interstitial edema.  Original Report Authenticated By: Osa Craver, M.D.   Dg Chest Port 1 View  03/27/2011  *RADIOLOGY REPORT*  Clinical Data: Nausea.  PORTABLE CHEST - 1 VIEW  Comparison: 03/06/2011  Findings: Technically limited study due to patient rotation.  The heart size and pulmonary vascularity appear prominent suggesting mild vascular congestion.  The interstitial changes suggesting mild edema.  No focal airspace consolidation.  No blunting of costophrenic angles.  No evidence of pneumothorax.  Healing bilateral rib fractures.  Old ununited displaced left clavicular fracture.  IMPRESSION: Mild cardiac enlargement and pulmonary vascular congestion with slight interstitial edema.  Old post-traumatic changes.  Original Report Authenticated By: Marlon Pel, M.D.    Microbiology: Recent Results (from the past 240 hour(s))  CULTURE, BLOOD (ROUTINE X 2)     Status: Normal   Collection Time   04/10/11  6:35 AM  Component Value Range Status Comment   Specimen Description BLOOD LEFT ARM   Final    Special Requests BOTTLES DRAWN AEROBIC AND ANAEROBIC 6 CC EACH   Final    Culture  Setup Time [id number]   Final    Culture NO GROWTH 5 DAYS   Final    Report Status 04/16/2011 FINAL   Final   CULTURE, BLOOD (ROUTINE X  2)     Status: Normal   Collection Time   04/10/11  7:08 AM      Component Value Range Status Comment   Specimen Description BLOOD RIGHT ANTECUBITAL   Final    Special Requests BOTTLES DRAWN AEROBIC ONLY 10 CC   Final    Culture  Setup Time [id number]   Final    Culture NO GROWTH 5 DAYS   Final    Report Status 04/16/2011 FINAL   Final      Labs: Basic Metabolic Panel:  Lab 04/17/11 4010 04/16/11 0915 04/14/11 0522 04/13/11 0531 04/11/11 1910 04/11/11 0511  NA 133* 132* 131* 129* -- 134*  K 4.0 4.8 -- -- -- --  CL 100 98 98 96 -- 99  CO2 24 26 27 25  -- 24  GLUCOSE 90 67* 92 87 -- 101*  BUN 34* 32* 45* 56* -- 40*  CREATININE 1.17 1.09 1.49* 1.72* -- 1.35  CALCIUM 9.1 9.4 9.1 8.9 -- 9.2  MG -- -- -- -- 2.0 --  PHOS -- -- -- -- -- --   CBC:  Lab 04/14/11 0522 04/13/11 0531 04/11/11 0511  WBC 7.6 10.2 15.6*  NEUTROABS -- -- --  HGB 9.3* 9.5* 9.7*  HCT 29.1* 30.4* 30.7*  MCV 80.4 80.4 81.4  PLT 346 382 439*    Time coordinating discharge: 35 minutes.  Signed:  Brendia Sacks, MD  [facility] 04/17/2011, 10:13 AM

## 2011-04-17 NOTE — Progress Notes (Addendum)
PROGRESS NOTE  Dale Mcfarland UJW:[id number] DOB: Jun 21, 1929 DOA: 04/09/2011 PCP: Georganna Skeans, MD, MD Cardiologist: Rinaldo Cloud, M.D.  Brief narrative:  78 year old man who is homeless who presents the emergency department for shortness of breath and dyspnea on exertion. Admitted for acute systolic congestive heart failure.   03/30/2011: Left AGAINST MEDICAL ADVICE. Discharge diagnoses: Systolic CHF with left ventricular ejection fraction 30-35%, COPD, tobacco abuse. History of negative stress test 03/02/2011. Noted to be noncompliant with medications.  Past medical history: COPD, chronic back pain, left ventricular ejection fraction 55-60%, upper GI bleed, hyponatremia, homelessness  Consultants:  Physical therapy: No needs identified.  Occupational therapy: No needs.  Procedures:  03/29/2011: 2-D echocardiogram: Left ventricular ejection fraction 55-60%. Normal wall motion.   Interim History: Reviewed in detail. Discussed with nurse: No concerns.  Subjective: No complaints.  Objective: Filed Vitals:   04/16/11 1423 04/16/11 1635 04/16/11 2101 04/17/11 0503  BP: 103/69 125/74 121/61 127/61  Pulse: 90  79 72  Temp: 98.5 F (36.9 C)  98.1 F (36.7 C) 97.9 F (36.6 C)  TempSrc: Oral  Oral Oral  Resp: 18  18 18   Height:      Weight:    61.553 kg (135 lb 11.2 oz)  SpO2: 100%  96% 97%    Intake/Output Summary (Last 24 hours) at 04/17/11 0933 Last data filed at 04/16/11 1852  Gross per 24 hour  Intake    560 ml  Output      0 ml  Net    560 ml    Exam:   General:  Appears calm and comfortable.  Cardiovascular: Regular rate and rhythm. No murmur, rub, gallop. No lower extremity edema. Noncompliant with telemetry  Respiratory: Clear to auscultation bilaterally. No wheezes, rales, rhonchi. Normal respiratory effort.  Data Reviewed: Basic Metabolic Panel:  Lab 04/17/11 5621 04/16/11 0915 04/14/11 0522 04/13/11 0531 04/11/11 1910 04/11/11 0511  NA 133* 132*  131* 129* -- 134*  K 4.0 4.8 -- -- -- --  CL 100 98 98 96 -- 99  CO2 24 26 27 25  -- 24  GLUCOSE 90 67* 92 87 -- 101*  BUN 34* 32* 45* 56* -- 40*  CREATININE 1.17 1.09 1.49* 1.72* -- 1.35  CALCIUM 9.1 9.4 9.1 8.9 -- 9.2  MG -- -- -- -- 2.0 --  PHOS -- -- -- -- -- --   BC:  Lab 04/14/11 0522 04/13/11 0531 04/11/11 0511  WBC 7.6 10.2 15.6*  NEUTROABS -- -- --  HGB 9.3* 9.5* 9.7*  HCT 29.1* 30.4* 30.7*  MCV 80.4 80.4 81.4  PLT 346 382 439*    Recent Results (from the past 240 hour(s))  CULTURE, BLOOD (ROUTINE X 2)     Status: Normal   Collection Time   04/10/11  6:35 AM      Component Value Range Status Comment   Specimen Description BLOOD LEFT ARM   Final    Special Requests BOTTLES DRAWN AEROBIC AND ANAEROBIC 6 CC EACH   Final    Culture  Setup Time [id number]   Final    Culture NO GROWTH 5 DAYS   Final    Report Status 04/16/2011 FINAL   Final   CULTURE, BLOOD (ROUTINE X 2)     Status: Normal   Collection Time   04/10/11  7:08 AM      Component Value Range Status Comment   Specimen Description BLOOD RIGHT ANTECUBITAL   Final    Special Requests BOTTLES DRAWN AEROBIC ONLY 10 CC  Final    Culture  Setup Time [id number]   Final    Culture NO GROWTH 5 DAYS   Final    Report Status 04/16/2011 FINAL   Final      Studies: Dg Chest 2 View  04/10/2011  *RADIOLOGY REPORT*  Clinical Data: Shortness of breath and nausea.  CHEST - 2 VIEW  Comparison: Chest radiograph performed 03/31/2011  Findings: The lungs are well-aerated.  Vascular congestion is noted.  Increased interstitial markings raise question for mild pulmonary edema, worsened from the prior study.  No pleural effusion or pneumothorax is seen.  The heart is borderline normal in size; the mediastinal contour is within normal limits.  No acute osseous abnormalities are seen.  IMPRESSION: Vascular congestion; new increased interstitial markings raise concern for mild new pulmonary edema.  Original Report Authenticated By:  Tonia Ghent, M.D.   Scheduled Meds:   . aspirin  81 mg Oral Daily  . enoxaparin  40 mg Subcutaneous Q24H  . furosemide  20 mg Oral Daily  . lisinopril  2.5 mg Oral Daily  . pantoprazole  40 mg Oral Q1200  . DISCONTD: levofloxacin  250 mg Oral Daily   Continuous Infusions:    Assessment/Plan: 1. Dyspnea on exertion: Resolved. Probably multifactorial including acute bronchitis and possible diastolic heart failure. 2. Questionable Systolic congestive heart failure: According to records initial 2-D echocardiogram in January demonstrated systolic heart failure and diffuse hypokinesis. However repeat echocardiogram February 15th demonstrates a normal left ventricular ejection fraction with no evidence of hypokinesis or wall motion abnormalities. Therefore at this time the patient has no evidence of systolic heart failure. I suspect the underlying etiology for his hypokinesis in the past with alcohol-related. He should continue on an aspirin. He will continue on low-dose ACE inhibitor. Of special note is that there was possible scarring on recent nuclear medicine stress imaging although was not definitive. The patient is unaware of any previous myocardial infarction and therefore at this point I do not suspect an ischemic cardiomyopathy and therefore do not think beta blocker is required.  3. Acute renal failure: Secondary to overdiuresis. Resolved. 4. Acute bronchitis: Treated with Levaquin. 5. Hyponatremia: Appears minimal, stable. 6. Anemia: Normocytic. Stable. Followup as an outpatient. 7. COPD: 8. Polysubstance abuse: Recommend cessation. 9. Homelessness: Has refused assistance.   Code Status: Full  Disposition Plan: Discharge today.  Has appointment April 18 at 9:15 AM with  Dr. Venetia Night.  Brendia Sacks, MD  [facility] Pager [phone] 04/17/2011, 9:33 AM    LOS: 8 days

## 2011-05-04 ENCOUNTER — Encounter (HOSPITAL_COMMUNITY): Payer: Self-pay

## 2011-05-04 ENCOUNTER — Inpatient Hospital Stay (HOSPITAL_COMMUNITY)
Admission: EM | Admit: 2011-05-04 | Discharge: 2011-05-06 | DRG: 378 | Disposition: A | Discharge status: Home / Self Care | Payer: Medicare Other | Attending: Family Medicine | Admitting: Family Medicine

## 2011-05-04 ENCOUNTER — Other Ambulatory Visit: Payer: Self-pay

## 2011-05-04 ENCOUNTER — Emergency Department (HOSPITAL_COMMUNITY): Payer: Medicare Other

## 2011-05-04 DIAGNOSIS — D72829 Elevated white blood cell count, unspecified: Secondary | ICD-10-CM

## 2011-05-04 DIAGNOSIS — D649 Anemia, unspecified: Secondary | ICD-10-CM

## 2011-05-04 DIAGNOSIS — K920 Hematemesis: Principal | ICD-10-CM | POA: Diagnosis present

## 2011-05-04 DIAGNOSIS — B3781 Candidal esophagitis: Secondary | ICD-10-CM | POA: Diagnosis present

## 2011-05-04 DIAGNOSIS — J4489 Other specified chronic obstructive pulmonary disease: Secondary | ICD-10-CM | POA: Diagnosis present

## 2011-05-04 DIAGNOSIS — Z5900 Homelessness unspecified: Secondary | ICD-10-CM

## 2011-05-04 DIAGNOSIS — J189 Pneumonia, unspecified organism: Secondary | ICD-10-CM

## 2011-05-04 DIAGNOSIS — R55 Syncope and collapse: Secondary | ICD-10-CM

## 2011-05-04 DIAGNOSIS — E860 Dehydration: Secondary | ICD-10-CM

## 2011-05-04 DIAGNOSIS — J449 Chronic obstructive pulmonary disease, unspecified: Secondary | ICD-10-CM

## 2011-05-04 DIAGNOSIS — R1013 Epigastric pain: Secondary | ICD-10-CM | POA: Diagnosis present

## 2011-05-04 DIAGNOSIS — K922 Gastrointestinal hemorrhage, unspecified: Secondary | ICD-10-CM | POA: Diagnosis present

## 2011-05-04 DIAGNOSIS — F17200 Nicotine dependence, unspecified, uncomplicated: Secondary | ICD-10-CM | POA: Diagnosis present

## 2011-05-04 DIAGNOSIS — I10 Essential (primary) hypertension: Secondary | ICD-10-CM | POA: Diagnosis present

## 2011-05-04 DIAGNOSIS — D62 Acute posthemorrhagic anemia: Secondary | ICD-10-CM | POA: Diagnosis present

## 2011-05-04 DIAGNOSIS — F1020 Alcohol dependence, uncomplicated: Secondary | ICD-10-CM

## 2011-05-04 DIAGNOSIS — R0602 Shortness of breath: Secondary | ICD-10-CM

## 2011-05-04 DIAGNOSIS — I509 Heart failure, unspecified: Secondary | ICD-10-CM

## 2011-05-04 DIAGNOSIS — Z720 Tobacco use: Secondary | ICD-10-CM

## 2011-05-04 DIAGNOSIS — D638 Anemia in other chronic diseases classified elsewhere: Secondary | ICD-10-CM

## 2011-05-04 HISTORY — DX: Dyspnea, unspecified: R06.00

## 2011-05-04 LAB — POCT I-STAT, CHEM 8
BUN: 26 mg/dL — ABNORMAL HIGH (ref 6–23)
Chloride: 106 mEq/L (ref 96–112)
Creatinine, Ser: 1.1 mg/dL (ref 0.50–1.35)
Hemoglobin: 8.2 g/dL — ABNORMAL LOW (ref 13.0–17.0)
Potassium: 4.3 mEq/L (ref 3.5–5.1)
Sodium: 140 mEq/L (ref 135–145)

## 2011-05-04 MED ORDER — SODIUM CHLORIDE 0.9 % IV BOLUS (SEPSIS)
500.0000 mL | Freq: Once | INTRAVENOUS | Status: AC
  Administered 2011-05-05 (×2): 500 mL via INTRAVENOUS

## 2011-05-04 MED ORDER — SODIUM CHLORIDE 0.9 % IV SOLN: INTRAVENOUS | Status: DC

## 2011-05-04 MED ORDER — PANTOPRAZOLE SODIUM 40 MG IV SOLR
40.0000 mg | Freq: Once | INTRAVENOUS | Status: AC
  Administered 2011-05-05: 40 mg via INTRAVENOUS
  Filled 2011-05-04: qty 40

## 2011-05-04 NOTE — ED Notes (Signed)
Per EMS, pt is homeless, was caught trespassing at [employer] and reported that he needed to go to hospital for evaluation of syncopal episode that occurred last week.

## 2011-05-04 NOTE — ED Notes (Signed)
[id number]> Expected date:<BR> Expected time: 6:33 PM<BR> Means of arrival:Ambulance<BR> Comments:<BR> M11 -- Going to Triage (see [employer] form)

## 2011-05-04 NOTE — ED Notes (Signed)
Pt presents by ambulance with the c/o of "fainting" for 1 year- pt presents with no acute distress- EKG performed in triage upon arrival

## 2011-05-04 NOTE — H&P (Signed)
PCP:  Georganna Skeans, MD, MD  [facility] but hasn't been there in a year.   Chief Complaint:  Dizziness, SOB  HPI: 43yoM with h/o DOE thought multifactorial due to COPD, and unclear ? HF, prior UGIB s/p  EGD showing severe esophagitis in 01/2011 now presents with acute Hgb drop, likely low  grade UGIB  Through 2012 pt was seen almost month in the ED, and was admitted in 01/2011, 02/2011, and  twice in 03/1011, with several ED visits in between. Noted to have left AMA x2 recently.  Most recently, discharged 04/17/2011 for DOE, thought due to acute bronchitis, ? diastolic  CHF. It was unclear if he truly has CHF or not given differing echo findings.   Pt was admitted in 01/2011 and EGD done for iron deficiency anemia, coffee ground emesis,  and alcohol abuse showed severe grade IV esophagitis, moderate hiatal hernia, and  multiple erosions in the duodenal bulb. Recommended avoiding NSAID's, give antireflux  regimen, high dose PPI's, and start diflucan for candidiasis (?).   Pt now comes back stating two days ago he vomited dark black material, but he had also  eaten some chocolate cake, but no frank hematemesis, no BRBPR, no melena, or other  visible blood loss. He complaints of minimal epigastric tenderness and some RLQ pain. He  has also been a bit more dizzy over the past couple days and feeling weak, but no  increased SOB above his baseline chronic dyspnea or chest pain.   In the ED, minimally tachy to 105 but otherwise vitals stable. Labs showed BUN 26 which  is actually lower than prior, and Hgb acutely dropped from 9-10 recently to 8.3.   ROS otherwise negative. Pt denies alcohol abuse, smokes 1/2 ppd but no other drugs,  denies any daily medications including any NSAID's. [place].   Past Medical History  Diagnosis Date  . COPD (chronic obstructive pulmonary disease)   . Chronic back pain   .  Pneumonia     01/2011 - CAP vs aspiration pneuonmia  . Depression   . PONV (postoperative nausea and vomiting)   . Hyponatremia     Previously felt secondary to SIADH  . Hypertension   . Upper GI bleed     01/2011 with EGD showing severe candida esophagitis and duodenal bulb erosion  . Anemia     In the setting of UGI bleed 01/2011 requiring blood transfusion  . Homelessness   . Dyspnea     Chronic, thought due to COPD. Echo 02/2010 with EF 30-35%, nuclear study negative, then repeat echo 03/2011 did not show systolic dysfxn, so unclear if truly HF    Past Surgical History  Procedure Date  . Tonsillectomy   . Vasectomy   . Appendectomy   . Tonsillectomy   . Colonoscopy   . Esophagogastroduodenoscopy   . Esophagogastroduodenoscopy 02/03/2011    Procedure: ESOPHAGOGASTRODUODENOSCOPY (EGD);  Surgeon: Charna Elizabeth, MD;  Location: [facility];  Service: Endoscopy;  Laterality: N/A;  . Esophagogastroduodenoscopy 02/03/2011    Procedure: ESOPHAGOGASTRODUODENOSCOPY (EGD);  Surgeon: Charna Elizabeth, MD;  Location: [facility];  Service: Endoscopy;  Laterality: N/A;    Medications:  HOME MEDS: He states he's not taking any daily medications.  Prior to Admission medications   Medication Sig Start Date End Date Taking? Authorizing Provider  aspirin 81 MG chewable tablet Chew 1 tablet (81 mg total) by mouth daily. 04/17/11 04/16/12  Standley Brooking, MD  furosemide (LASIX) 20 MG tablet Take 1 tablet (20 mg total) by mouth daily. 04/17/11 04/16/12  Standley Brooking, MD  lisinopril (PRINIVIL,ZESTRIL) 2.5 MG tablet Take 1 tablet (2.5 mg total) by mouth daily. 04/17/11 04/16/12  Standley Brooking, MD    Allergies:  Allergies  Allergen Reactions  . Penicillins Itching    Social History:   reports that he has been smoking Cigarettes.  He has a 60 pack-year smoking history. He has never used smokeless tobacco. He reports that he drinks alcohol. He reports that he does not use illicit drugs. Lives [place]  [place]" with various other people 44-31 years of age including ex-cons. Has a graduate degree in Physical Science. 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 Stopped working to help his sick father after his father's MI. Has 2 daughters in [place]. 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   Family History: Family History  Problem Relation Age of Onset  . Coronary artery disease Father     Starting in his 39's. Died of massive MI at age 74.  . Schizophrenia Brother   . Coronary artery disease Sister     MI at age 47  . Alzheimer's disease Mother     Physical Exam: Filed Vitals:   05/04/11 2037 05/04/11 2052 05/04/11 2055 05/04/11 2057  BP: 134/60 144/53 129/67 105/65  Pulse: 88 85 93 105  Temp: 99 F (37.2 C)     TempSrc: Oral     Resp: 20     SpO2: 96%      Blood pressure 105/65, pulse 105, temperature 99 F (37.2 C), temperature source Oral, resp. rate 20, SpO2 96.00%. Gen: Malodorous, poorly groomed elderly M in no distress, pleasant enough, able to relate  history well, no distress, breathing comfortably. Eating a meal without difficulty, quite  a good appetite apparently HEENT: pupils round, reactive, EOMI, sclera clear normal appearing, mouth moist, some  poor dentition Lungs: Some inspiratory rhonchi at the bases appreciated, but no gross wheezing Heart: Regular, not tachycardic, no m/g heard Abd: Soft, nontender, non distended, subjective tenderness epigastrically but really  quite underwhelming Extrem: Warm, perfusing well, normal appearing, radials palpable, no BLE edema Neuro: Alert, attentive, conversant, CN2-12 intact, sitting at bedside and moves  extremities on his own, feeding himself, no deficits grossly   Labs & Imaging Results for orders placed during the hospital encounter of 05/04/11 (from the past 48 hour(s))  GLUCOSE, CAPILLARY     Status: Normal   Collection Time   05/04/11  9:17 PM      Component  Value Range Comment   Glucose-Capillary 86  70 - 99 (mg/dL)   POCT I-STAT, CHEM 8     Status: Abnormal   Collection Time   05/04/11  9:27 PM      Component Value Range Comment   Sodium 140  135 - 145 (mEq/L)    Potassium 4.3  3.5 - 5.1 (mEq/L)    Chloride 106  96 - 112 (mEq/L)    BUN 26 (*) 6 - 23 (mg/dL)    Creatinine, Ser 9.60  0.50 - 1.35 (mg/dL)    Glucose, Bld 89  70 - 99 (mg/dL)    Calcium, Ion 4.54  1.12 - 1.32 (mmol/L)    TCO2 24  0 - 100 (mmol/L)    Hemoglobin 8.2 (*) 13.0 - 17.0 (g/dL)    HCT 09.8 (*) 11.9 - 52.0 (%)  OCCULT BLOOD, POC DEVICE     Status: Normal   Collection Time   05/04/11 10:00 PM      Component Value Range Comment   Fecal Occult Bld POSITIVE      Dg Chest 2 View  05/04/2011  *RADIOLOGY REPORT*  Clinical Data: Cough.  Shortness of breath.  Right-sided chest pain.  COPD.  CHEST - 2 VIEW  Comparison: 04/10/2011  Findings: Heart size remains within normal limits.  Chronic pulmonary interstitial prominence is unchanged.  No evidence of acute or superimposed infiltrate.  No evidence of pleural effusion. No mass or lymphadenopathy identified.  Old bilateral rib and left clavicle fracture deformities again noted.  IMPRESSION: Stable exam.  No active disease.  Original Report Authenticated By: Danae Orleans, M.D.    Impression Present on Admission:  .Anemia associated with acute blood loss .COPD (chronic obstructive pulmonary disease) .Upper GI bleeding   [name] with h/o DOE thought multifactorial due to COPD, and unclear ? HF, prior UGIB s/p  EGD showing severe esophagitis in 01/2011 now presents with acute Hgb drop, likely low  grade UGIB  1. UGIB: BUN is only 26, which is actually lower than earlier this month, however Hgb is  8.2 which is acutely down from baseline and fecal occult positive. Suspect low grade UGIB  or ooze from gastritis/esophagitis. He denies any alcohol intake although EPIC notes  indicate this, and denies any NSAID's.   - Maintenance  IVF's, protonix 80 mg IV BID, will transfuse 1u PRBC's and trend.  - Start diflucan that was recommended back in 01/2011 as I seriously doubt he ever took  this.  - Of note, pt is eating voraciously while I interview him, see little benefit to keeping  NPO at this point, given I think low likelihood for EGD intervention given known  esophagitis.   2. COPD: Not active issue, will give PRN nebs 3. Social: SW consult   No DVT prophy  Presumed full code  Telemetry, [facility] team 5  Other plans as per orders.   Lee-Anne Flicker 05/04/2011, 11:46 PM

## 2011-05-04 NOTE — ED Provider Notes (Addendum)
History     CSN: [id number]  Arrival date & time 05/04/11  1839   First MD Initiated Contact with Patient 05/04/11 2019      Chief Complaint  Patient presents with  . Near Syncope    (Consider location/radiation/quality/duration/timing/severity/associated sxs/prior treatment) HPI Comments: This patient is an elderly, homeless gentleman, who got in an altercation with the police today. He states that they were going to arrest him. He said he needed to go the hospital for evaluation and was brought here for evaluation. He complains of a cough that sounds fairly chronic. He denies any new complaints. He says he had a near syncopal episode, but this was about a year ago. Denies any recent illnesses other than yesterday. He said he had some vomiting. Currently denies any pain anywhere other than in his joints. Denies any chest pain or shortness of breath. In he does have some shortness of breath on ambulation, but this is chronic for him. Denies any fevers or other recent illnesses  The history is provided by the patient.    Past Medical History  Diagnosis Date  . COPD (chronic obstructive pulmonary disease)   . Chronic back pain   . Pneumonia     01/2011 - CAP vs aspiration pneuonmia  . Depression   . PONV (postoperative nausea and vomiting)   . Hyponatremia     Previously felt secondary to SIADH  . Hypertension   . Upper GI bleed     01/2011 with EGD showing severe candida esophagitis and duodenal bulb erosion  . Anemia     In the setting of UGI bleed 01/2011 requiring blood transfusion  . Homelessness     Past Surgical History  Procedure Date  . Tonsillectomy   . Vasectomy   . Appendectomy   . Tonsillectomy   . Colonoscopy   . Esophagogastroduodenoscopy   . Esophagogastroduodenoscopy 02/03/2011    Procedure: ESOPHAGOGASTRODUODENOSCOPY (EGD);  Surgeon: Charna Elizabeth, MD;  Location: [facility];  Service: Endoscopy;  Laterality: N/A;  . Esophagogastroduodenoscopy 02/03/2011     Procedure: ESOPHAGOGASTRODUODENOSCOPY (EGD);  Surgeon: Charna Elizabeth, MD;  Location: [facility];  Service: Endoscopy;  Laterality: N/A;    Family History  Problem Relation Age of Onset  . Coronary artery disease Father     Starting in his 58's. Died of massive MI at age 57.  . Schizophrenia Brother   . Coronary artery disease Sister     MI at age 59  . Alzheimer's disease Mother     History  Substance Use Topics  . Smoking status: Current Some Day Smoker -- 1.0 packs/day for 60 years    Types: Cigarettes  . Smokeless tobacco: Never Used   Comment: Since age 66  . Alcohol Use: Yes     174 ml of scotch weekly      Review of Systems  Constitutional: Positive for fatigue. Negative for fever, chills and diaphoresis.  HENT: Negative for congestion, rhinorrhea and sneezing.   Eyes: Negative.   Respiratory: Positive for cough. Negative for chest tightness and shortness of breath.   Cardiovascular: Negative for chest pain and leg swelling.  Gastrointestinal: Negative for nausea, vomiting, abdominal pain, diarrhea and blood in stool.  Genitourinary: Negative for frequency, hematuria, flank pain and difficulty urinating.  Musculoskeletal: Positive for back pain, joint swelling and arthralgias.  Skin: Negative for rash.  Neurological: Negative for dizziness, speech difficulty, weakness, numbness and headaches.    Allergies  Penicillins  Home Medications   Current Outpatient Rx  Name Route Sig Dispense Refill  . ASPIRIN 81 MG PO CHEW Oral Chew 1 tablet (81 mg total) by mouth daily.    . FUROSEMIDE 20 MG PO TABS Oral Take 1 tablet (20 mg total) by mouth daily. 30 tablet 1  . LISINOPRIL 2.5 MG PO TABS Oral Take 1 tablet (2.5 mg total) by mouth daily. 30 tablet 1    BP 105/65  Pulse 105  Temp(Src) 99 F (37.2 C) (Oral)  Resp 20  SpO2 96%  Physical Exam  Constitutional: He is oriented to person, place, and time. He appears well-developed and well-nourished.  HENT:    Head: Normocephalic and atraumatic.  Eyes: Pupils are equal, round, and reactive to light.  Neck: Normal range of motion. Neck supple.  Cardiovascular: Normal rate, regular rhythm and normal heart sounds.   Pulmonary/Chest: Effort normal. No respiratory distress. He has no wheezes. He has no rales. He exhibits no tenderness.       Rhonchi bilaterally  Abdominal: Soft. Bowel sounds are normal. There is no tenderness. There is no rebound and no guarding.  Musculoskeletal: Normal range of motion. He exhibits no edema.  Lymphadenopathy:    He has no cervical adenopathy.  Neurological: He is alert and oriented to person, place, and time.  Skin: Skin is warm and dry. No rash noted.  Psychiatric: He has a normal mood and affect.    ED Course  Procedures (including critical care time)  Results for orders placed during the hospital encounter of 05/04/11  GLUCOSE, CAPILLARY      Component Value Range   Glucose-Capillary 86  70 - 99 (mg/dL)  POCT I-STAT, CHEM 8      Component Value Range   Sodium 140  135 - 145 (mEq/L)   Potassium 4.3  3.5 - 5.1 (mEq/L)   Chloride 106  96 - 112 (mEq/L)   BUN 26 (*) 6 - 23 (mg/dL)   Creatinine, Ser 2.84  0.50 - 1.35 (mg/dL)   Glucose, Bld 89  70 - 99 (mg/dL)   Calcium, Ion 1.32  4.40 - 1.32 (mmol/L)   TCO2 24  0 - 100 (mmol/L)   Hemoglobin 8.2 (*) 13.0 - 17.0 (g/dL)   HCT 10.2 (*) 72.5 - 52.0 (%)  OCCULT BLOOD, POC DEVICE      Component Value Range   Fecal Occult Bld POSITIVE     Dg Chest 2 View  05/04/2011  *RADIOLOGY REPORT*  Clinical Data: Cough.  Shortness of breath.  Right-sided chest pain.  COPD.  CHEST - 2 VIEW  Comparison: 04/10/2011  Findings: Heart size remains within normal limits.  Chronic pulmonary interstitial prominence is unchanged.  No evidence of acute or superimposed infiltrate.  No evidence of pleural effusion. No mass or lymphadenopathy identified.  Old bilateral rib and left clavicle fracture deformities again noted.  IMPRESSION:  Stable exam.  No active disease.  Original Report Authenticated By: Danae Orleans, M.D.   Dg Chest 2 View  04/10/2011  *RADIOLOGY REPORT*  Clinical Data: Shortness of breath and nausea.  CHEST - 2 VIEW  Comparison: Chest radiograph performed 03/31/2011  Findings: The lungs are well-aerated.  Vascular congestion is noted.  Increased interstitial markings raise question for mild pulmonary edema, worsened from the prior study.  No pleural effusion or pneumothorax is seen.  The heart is borderline normal in size; the mediastinal contour is within normal limits.  No acute osseous abnormalities are seen.  IMPRESSION: Vascular congestion; new increased interstitial markings raise concern for mild  new pulmonary edema.  Original Report Authenticated By: Tonia Ghent, M.D.     Date: 05/04/2011  Rate: 96  Rhythm: normal sinus rhythm  QRS Axis: normal  Intervals: normal  ST/T Wave abnormalities: nonspecific ST/T changes, q waves present  Conduction Disutrbances:none  Narrative Interpretation:   Old EKG Reviewed: unchanged    1. Anemia   2. GI bleed       MDM  Pt with anemia, has been continuously dropping over last few months.  On discharge last month, was 9.3 which had dropped from 10 on admission.  Is 8.2 today, heme+stool, but no gross blood.  Had some black emesis 3 days ago, but none since then.  Hx of ETOH use.  Will consult hospitalist for admission.        Rolan Bucco, MD 05/04/11 1610  Rolan Bucco, MD 05/04/11 9604  Rolan Bucco, MD 05/04/11 2241

## 2011-05-05 LAB — CBC
HCT: 27.6 % — ABNORMAL LOW (ref 39.0–52.0)
HCT: 27.1 % — ABNORMAL LOW (ref 39.0–52.0)
Hemoglobin: 8.4 g/dL — ABNORMAL LOW (ref 13.0–17.0)
MCH: 25.2 pg — ABNORMAL LOW (ref 26.0–34.0)
MCH: 25.3 pg — ABNORMAL LOW (ref 26.0–34.0)
MCHC: 31.2 g/dL (ref 30.0–36.0)
MCHC: 31 g/dL (ref 30.0–36.0)
MCV: 80.9 fL (ref 78.0–100.0)
MCV: 81.6 fL (ref 78.0–100.0)
RDW: 16.2 % — ABNORMAL HIGH (ref 11.5–15.5)
RDW: 16.3 % — ABNORMAL HIGH (ref 11.5–15.5)
WBC: 7.8 10*3/uL (ref 4.0–10.5)

## 2011-05-05 LAB — BASIC METABOLIC PANEL
BUN: 20 mg/dL (ref 6–23)
CO2: 24 mEq/L (ref 19–32)
Calcium: 8.5 mg/dL (ref 8.4–10.5)
Creatinine, Ser: 0.98 mg/dL (ref 0.50–1.35)
GFR calc non Af Amer: 76 mL/min — ABNORMAL LOW (ref >90)
Glucose, Bld: 115 mg/dL — ABNORMAL HIGH (ref 70–99)

## 2011-05-05 MED ORDER — LORAZEPAM 0.5 MG PO TABS: 0.5000 mg | ORAL_TABLET | Freq: Four times a day (QID) | ORAL | Status: DC | PRN

## 2011-05-05 MED ORDER — DOCUSATE SODIUM 100 MG PO CAPS
100.0000 mg | ORAL_CAPSULE | Freq: Two times a day (BID) | ORAL | Status: DC
  Administered 2011-05-05 – 2011-05-06 (×3): 100 mg via ORAL
  Filled 2011-05-05 (×4): qty 1

## 2011-05-05 MED ORDER — ONDANSETRON HCL 4 MG/2ML IJ SOLN: 4.0000 mg | Freq: Four times a day (QID) | INTRAMUSCULAR | Status: DC | PRN

## 2011-05-05 MED ORDER — SODIUM CHLORIDE 0.9 % IJ SOLN: 3.0000 mL | Freq: Two times a day (BID) | INTRAMUSCULAR | Status: DC

## 2011-05-05 MED ORDER — ACETAMINOPHEN 325 MG PO TABS: 650.0000 mg | ORAL_TABLET | Freq: Four times a day (QID) | ORAL | Status: DC | PRN

## 2011-05-05 MED ORDER — ALBUTEROL SULFATE (5 MG/ML) 0.5% IN NEBU: 2.5000 mg | INHALATION_SOLUTION | Freq: Four times a day (QID) | RESPIRATORY_TRACT | Status: DC | PRN

## 2011-05-05 MED ORDER — SODIUM CHLORIDE 0.9 % IV SOLN
Freq: Once | INTRAVENOUS | Status: AC
  Administered 2011-05-05: 1000 mL via INTRAVENOUS

## 2011-05-05 MED ORDER — FLUCONAZOLE 40 MG/ML PO SUSR
200.0000 mg | Freq: Every day | ORAL | Status: DC
  Administered 2011-05-05: 200 mg via ORAL
  Filled 2011-05-05: qty 5

## 2011-05-05 MED ORDER — PANTOPRAZOLE SODIUM 40 MG IV SOLR
80.0000 mg | Freq: Two times a day (BID) | INTRAVENOUS | Status: DC
  Administered 2011-05-05: 80 mg via INTRAVENOUS
  Filled 2011-05-05 (×2): qty 80

## 2011-05-05 MED ORDER — METOPROLOL TARTRATE 12.5 MG HALF TABLET
12.5000 mg | ORAL_TABLET | Freq: Two times a day (BID) | ORAL | Status: DC
  Administered 2011-05-05 – 2011-05-06 (×3): 12.5 mg via ORAL
  Filled 2011-05-05 (×4): qty 1

## 2011-05-05 MED ORDER — ACETAMINOPHEN 650 MG RE SUPP: 650.0000 mg | Freq: Four times a day (QID) | RECTAL | Status: DC | PRN

## 2011-05-05 MED ORDER — PANTOPRAZOLE SODIUM 40 MG PO TBEC
40.0000 mg | DELAYED_RELEASE_TABLET | Freq: Every day | ORAL | Status: DC
  Filled 2011-05-05: qty 1

## 2011-05-05 MED ORDER — ONDANSETRON HCL 4 MG PO TABS: 4.0000 mg | ORAL_TABLET | Freq: Four times a day (QID) | ORAL | Status: DC | PRN

## 2011-05-05 MED ORDER — NICOTINE 21 MG/24HR TD PT24
21.0000 mg | MEDICATED_PATCH | Freq: Every day | TRANSDERMAL | Status: DC
  Administered 2011-05-05: 21 mg via TRANSDERMAL
  Filled 2011-05-05 (×2): qty 1

## 2011-05-05 MED ORDER — SENNA 8.6 MG PO TABS
1.0000 | ORAL_TABLET | Freq: Two times a day (BID) | ORAL | Status: DC
  Administered 2011-05-05: 8.6 mg via ORAL
  Filled 2011-05-05: qty 1

## 2011-05-05 NOTE — Progress Notes (Signed)
PROGRESS NOTE  Dale Mcfarland ZOX:[id number] DOB: Apr 12, 1929 DOA: 05/04/2011 PCP: Georganna Skeans, MD, MD  Brief narrative: 78 year old homeless man presented to the emergency department after an altercation with police. Complained of vomiting black material 2 days prior to admission. No hematemesis, bleeding or melena. Complained of dizziness and feeling weak. Hemoglobin was noted to be lower than previous studies the patient was referred for admission for possible GI bleed. Also complains of syncope or near-syncope which appears to have happened weeks to a year ago.  Chart review:  04/09/2011-04/17/2011 hospitalization: Multifactorial dyspnea on exertion, acute bronchitis, possible diastolic congestive heart failure, acute renal failure, hyponatremia, polysubstance abuse, noncompliance.  03/27/2011-03/30/2011 hospitalization: The patient left AGAINST MEDICAL ADVICE with the following diagnoses: Congestive heart failure, COPD, tobacco abuse  02/02/2011-02/06/2011 hospitalization: Upper GI bleed secondary to grade 4 Candida esophagitis with multiple erosions in the duodenal bulb. Treated with high-dose PPI and Diflucan. Advised abstinence from alcohol and tobacco. Require transfusion of packed red blood cells. Alcohol dependence.  Past medical history: Alcoholism, homelessness, COPD, hyponatremia, upper GI bleed secondary to severe Candida esophagitis and duodenal bulb erosion  Consultants:  None  Procedures:  March 24: Transfusion 1 unit packed red blood cells.  Interim History: Chart reviewed in detail and summarized as above. Afebrile, vital signs stable  Subjective: "Hungry". No bleeding, no pain, no hematemesis.  Objective: Filed Vitals:   05/05/11 0410 05/05/11 0510 05/05/11 0610 05/05/11 0700  BP: 96/47 144/71 138/70 153/78  Pulse: 78 86 89 89  Temp: 97 F (36.1 C) 98.9 F (37.2 C) 98.4 F (36.9 C) 98.9 F (37.2 C)  TempSrc: Oral Oral Oral Oral  Resp: 16 16 16 16   Height:       Weight:      SpO2:        Intake/Output Summary (Last 24 hours) at 05/05/11 0838 Last data filed at 05/05/11 0410  Gross per 24 hour  Intake  350.5 ml  Output    175 ml  Net  175.5 ml    Exam:   General:  Appears calm and comfortable.  Cardiovascular: Regular rate and rhythm. No murmur, rub, gallop.  Telemetry: Sinus rhythm. Nonsustained V. tach.  Respiratory: Clear to auscultation bilaterally. No wheezes, rales, rhonchi. Normal respiratory effort.  Psychiatric: Grossly normal mood and affect. Speech fluent and appropriate.  Data Reviewed: Basic Metabolic Panel:  Lab 05/04/11 8119  NA 140  K 4.3  CL 106  CO2 --  GLUCOSE 89  BUN 26*  CREATININE 1.10  CALCIUM --  MG --  PHOS --   CBC:  Lab 05/04/11 2127  WBC --  NEUTROABS --  HGB 8.2*  HCT 24.0*  MCV --  PLT --   CBG:  Lab 05/04/11 2117  GLUCAP 86   Studies: Dg Chest 2 View  05/04/2011  *RADIOLOGY REPORT*  Clinical Data: Cough.  Shortness of breath.  Right-sided chest pain.  COPD.  CHEST - 2 VIEW  Comparison: 04/10/2011  Findings: Heart size remains within normal limits.  Chronic pulmonary interstitial prominence is unchanged.  No evidence of acute or superimposed infiltrate.  No evidence of pleural effusion. No mass or lymphadenopathy identified.  Old bilateral rib and left clavicle fracture deformities again noted.  IMPRESSION: Stable exam.  No active disease.  Original Report Authenticated By: Danae Orleans, M.D.   Scheduled Meds:   . sodium chloride   Intravenous Once  . docusate sodium  100 mg Oral BID  . fluconazole  200 mg Oral Daily  . pantoprazole (  PROTONIX) IV  80 mg Intravenous Q12H  . pantoprazole (PROTONIX) IV  40 mg Intravenous Once  . senna  1 tablet Oral BID  . sodium chloride  500 mL Intravenous Once  . sodium chloride  3 mL Intravenous Q12H  . DISCONTD: sodium chloride   Intravenous STAT   Continuous Infusions:    Assessment/Plan: 1. Possible Upper GI bleed: Not clear  that there is any acute blood loss. Most likely etiologies would be alcoholic gastritis, recurrent Candida esophagitis. No active bleeding. Followup repeat CBCs. Unless develops bleeding or blood count drops would not pursue any further evaluation. Empiric PPI and Diflucan. 2. Chronic normocytic anemia: Possible component of acute blood loss anemia. Status post 1 unit packed red blood cells. Followup CBC.  3. History of esophageal candidiasis: Diflucan as above. 4. Alcohol abuse: Recommend cessation. 5. Cigarette smoker: Recommend cessation. Nicotine patch. 6. Homeless: Patient has refused assistance in the past. I would however like to offer him social work assistance again.  Code Status: Full code Family Communication: None bedside Disposition Plan: Return to prior living arrangements or shelter when stable. Less than 24 hours.   Brendia Sacks, MD  [facility] Pager [phone] 05/05/2011, 8:38 AM    LOS: 1 day

## 2011-05-05 NOTE — Progress Notes (Signed)
Patient was found taking shower without making anyone aware. Patient removed telemetry prior to showering and will not put back on. Also noted urine being darker more blood tinged. Security called with AC and removed pocket knife, lighter and cigarettes. Nurse locked cigarettes up and security kept knife and lighter. Made On call Dr. Mikeal Hawthorne aware- orders given to discontinue telemetry. Will carry out orders.

## 2011-05-05 NOTE — Progress Notes (Signed)
Pulled iv out. Stated he did not want it in anymore and did not want it restarted.  Has clothes out of bucket he brought to hospital all over room. Calm, eating supper. Informed Dr. Irene Limbo via text pt pulled iv out and refuses restart.

## 2011-05-05 NOTE — Progress Notes (Addendum)
Cm spoke with pt concerning Cm consult for medication assistance. Pt ineligible for indigent funds. Pt explained Medication Assistance program eligibility only once a year, pt not eligible until 01/2013. Pt given [facility] generic drug list.   Leonie Green [phone]

## 2011-05-05 NOTE — Plan of Care (Signed)
Problem: Phase I Progression Outcomes Goal: Hemodynamically stable Outcome: Not Progressing Patient bp is elevated 171/ - on beta blocker- for management

## 2011-05-06 LAB — TYPE AND SCREEN
ABO/RH(D): O NEG
Antibody Screen: NEGATIVE

## 2011-05-06 LAB — CBC
MCH: 25.6 pg — ABNORMAL LOW (ref 26.0–34.0)
MCHC: 31.5 g/dL (ref 30.0–36.0)
MCV: 81.4 fL (ref 78.0–100.0)
Platelets: 375 10*3/uL (ref 150–400)
RBC: 3.71 MIL/uL — ABNORMAL LOW (ref 4.22–5.81)

## 2011-05-06 MED ORDER — OMEPRAZOLE 20 MG PO CPDR: 20.0000 mg | DELAYED_RELEASE_CAPSULE | Freq: Every day | ORAL | Status: DC

## 2011-05-06 MED ORDER — METOPROLOL TARTRATE 12.5 MG HALF TABLET: 12.5000 mg | ORAL_TABLET | Freq: Two times a day (BID) | ORAL | Status: DC

## 2011-05-06 MED ORDER — FLUCONAZOLE 40 MG/ML PO SUSR: 200.0000 mg | Freq: Every day | ORAL | Status: DC

## 2011-05-06 MED ORDER — FLUCONAZOLE 40 MG/ML PO SUSR: 200.0000 mg | Freq: Every day | ORAL | Status: AC

## 2011-05-06 NOTE — Discharge Instructions (Signed)
No alcohol  

## 2011-05-06 NOTE — Discharge Summary (Signed)
Physician Discharge Summary  Patient ID: Dale Mcfarland MRN: [id number] DOB/AGE: 08/03/1929 78 y.o.  Admit date: 05/04/2011 Discharge date: 05/06/2011    Discharge Diagnoses:  Principal Problem:  *Upper GI bleeding Active Problems:  Anemia associated with acute blood loss  COPD (chronic obstructive pulmonary disease)  Homelessness   Consults: None  Procedures: 3/24: transfusion of 1 unit PRBCs.  Brief Summary: Galdino Hinchman is a 78 y.o. y/o male with a PMH of COPD, alcoholism, homelessness and GIB secondary to candida esophagitis and duodenal bulb erosion in 12/12. Pt presented to the emergency department on 3/23 after an altercation with police. Complained of vomiting black material 2 days prior to admission. No hematemesis, bleeding or melena. Complained of dizziness and feeling weak. Hemoglobin was noted to be lower than previous studies @ 8.2 the patient was referred for admission for possible GI bleed.     The patient has not had any evidence of GIB during hospitalization with no hematemesis, melena or hematochezia.    Chart review:  04/09/2011-04/17/2011 hospitalization: Multifactorial dyspnea on exertion, acute bronchitis, possible diastolic congestive heart failure, acute renal failure, hyponatremia, polysubstance abuse, noncompliance.  03/27/2011-03/30/2011 hospitalization: The patient left AGAINST MEDICAL ADVICE with the following diagnoses: Congestive heart failure, COPD, tobacco abuse  02/02/2011-02/06/2011 hospitalization: Upper GI bleed secondary to grade 4 Candida esophagitis with multiple erosions in the duodenal bulb. Treated with high-dose PPI and Diflucan. Advised abstinence from alcohol and tobacco. Require transfusion of packed red blood cells. Alcohol dependence.   Hospital Course by Discharge Summary  1. Possible Upper GI bleed: Not clear that there is any acute blood loss. Most likely etiologies would be alcoholic gastritis, recurrent Candida esophagitis. No  active bleeding.Repeat CBCs have remained stable with no further drop in hemoglobin. Pt did not show any signs of GIB during hospitalization therefore further investigation felt unnecessary. Pt has refused outpatient follow-up. Will continue Diflucan x 14 days and PPI at discharge.  2. Chronic normocytic anemia: Possible component of acute blood loss anemia. Status post 1 unit packed red blood cells. Hgb stable and pt with no evidence of active bleeding  3. Htn, uncontrolled prior to admission: low dose beta blocker added to medication regimen.  4. History of esophageal candidiasis: Diflucan as above.  5. Alcohol abuse: Recommend cessation.  6. Cigarette smoker: Recommend cessation.   7. Homeless: Patient has refused assistance in the past and again during this hospitalization. He will not be eligible for hospital indigent funds again until 01/2013.   Significant Diagnostic Studies:  Clinical Data: Cough. Shortness of breath. Right-sided chest  pain. COPD.  CHEST - 2 VIEW  Comparison: 04/10/2011  Findings: Heart size remains within normal limits. Chronic  pulmonary interstitial prominence is unchanged. No evidence of  acute or superimposed infiltrate. No evidence of pleural effusion.  No mass or lymphadenopathy identified. Old bilateral rib and left  clavicle fracture deformities again noted.  IMPRESSION:  Stable exam. No active disease.  Original Report Authenticated By: Danae Orleans, M.D.   Discharge Exam: General: Awake, alert sitting in bedside chair in NAD CV: S1S2 RRR, no m/r/g Resp: CTAB, no w/r/c, no increased wob GI: abdomen soft, NT/ND, BS+ Neuro: no focal m/s deficits on exam Psych: AOx4, normal mood and affect   Discharge Labs  BMET  Lab 05/05/11 1005 05/04/11 2127  NA 139 140  K 4.0 4.3  CL 108 106  CO2 24 --  GLUCOSE 115* 89  BUN 20 26*  CREATININE 0.98 1.10  CALCIUM 8.5 --  MG 2.0 --  PHOS -- --     CBC   Lab 05/06/11 0447 05/05/11 1514  05/05/11 1005  HGB 9.5* 8.4* 8.6*  HCT 30.2* 27.1* 27.6*  WBC 9.6 8.1 7.8  PLT 375 296 311      Medication List  As of 05/06/2011 10:43 AM   START taking these medications         fluconazole 40 MG/ML suspension   Commonly known as: DIFLUCAN   Take 5 mLs (200 mg total) by mouth daily.      metoprolol tartrate 12.5 mg Tabs   Commonly known as: LOPRESSOR   Take 0.5 tablets (12.5 mg total) by mouth 2 (two) times daily.      omeprazole 20 MG capsule   Commonly known as: PRILOSEC   Take 1 capsule (20 mg total) by mouth daily.         CONTINUE taking these medications         aspirin 81 MG chewable tablet   Chew 1 tablet (81 mg total) by mouth daily.      furosemide 20 MG tablet   Commonly known as: LASIX   Take 1 tablet (20 mg total) by mouth daily.      lisinopril 2.5 MG tablet   Commonly known as: PRINIVIL,ZESTRIL   Take 1 tablet (2.5 mg total) by mouth daily.          Where to get your medications    These are the prescriptions that you need to pick up. We sent them to a specific pharmacy, so you will need to go there to get them.   [facility] - [place], [place] - [address]    [address]  [place] [address]    Phone: [phone]        metoprolol tartrate 12.5 mg Tabs         You may get these medications from any pharmacy.         fluconazole 40 MG/ML suspension   omeprazole 20 MG capsule           \  Disposition: 01-Home or Self Care  Discharged Condition: Mohid Furuya has met maximum benefit of inpatient care and is medically stable and cleared for discharge.  Patient is pending follow up as above.      Time spent on disposition:  35 minutes.   Cordelia Pen, NP-C [facility]  pgr [phone]

## 2011-05-06 NOTE — Plan of Care (Signed)
Problem: Phase II Progression Outcomes Goal: No active bleeding Outcome: Not Progressing Noted patient having blood tinged urine output. Made on call md aware

## 2011-05-06 NOTE — Discharge Summary (Addendum)
Patient seen and independently examined. I agree with exam, assessment and plan. I agree with discharge today.

## 2011-12-25 ENCOUNTER — Emergency Department (HOSPITAL_COMMUNITY)
Admission: EM | Admit: 2011-12-25 | Discharge: 2011-12-25 | Disposition: A | Discharge status: Home / Self Care | Payer: Medicare Other | Attending: Emergency Medicine | Admitting: Emergency Medicine

## 2011-12-25 ENCOUNTER — Encounter (HOSPITAL_COMMUNITY): Payer: Self-pay | Admitting: Emergency Medicine

## 2011-12-25 ENCOUNTER — Emergency Department (HOSPITAL_COMMUNITY): Payer: Medicare Other

## 2011-12-25 DIAGNOSIS — I10 Essential (primary) hypertension: Secondary | ICD-10-CM | POA: Insufficient documentation

## 2011-12-25 DIAGNOSIS — Z79899 Other long term (current) drug therapy: Secondary | ICD-10-CM | POA: Insufficient documentation

## 2011-12-25 DIAGNOSIS — IMO0001 Reserved for inherently not codable concepts without codable children: Secondary | ICD-10-CM | POA: Insufficient documentation

## 2011-12-25 DIAGNOSIS — F17200 Nicotine dependence, unspecified, uncomplicated: Secondary | ICD-10-CM | POA: Insufficient documentation

## 2011-12-25 DIAGNOSIS — R0609 Other forms of dyspnea: Secondary | ICD-10-CM | POA: Insufficient documentation

## 2011-12-25 DIAGNOSIS — F3289 Other specified depressive episodes: Secondary | ICD-10-CM | POA: Insufficient documentation

## 2011-12-25 DIAGNOSIS — R0989 Other specified symptoms and signs involving the circulatory and respiratory systems: Secondary | ICD-10-CM | POA: Insufficient documentation

## 2011-12-25 DIAGNOSIS — M549 Dorsalgia, unspecified: Secondary | ICD-10-CM | POA: Insufficient documentation

## 2011-12-25 DIAGNOSIS — G8929 Other chronic pain: Secondary | ICD-10-CM | POA: Insufficient documentation

## 2011-12-25 DIAGNOSIS — Z8639 Personal history of other endocrine, nutritional and metabolic disease: Secondary | ICD-10-CM | POA: Insufficient documentation

## 2011-12-25 DIAGNOSIS — J4489 Other specified chronic obstructive pulmonary disease: Secondary | ICD-10-CM | POA: Insufficient documentation

## 2011-12-25 DIAGNOSIS — Z8701 Personal history of pneumonia (recurrent): Secondary | ICD-10-CM | POA: Insufficient documentation

## 2011-12-25 DIAGNOSIS — Z8719 Personal history of other diseases of the digestive system: Secondary | ICD-10-CM | POA: Insufficient documentation

## 2011-12-25 DIAGNOSIS — M791 Myalgia, unspecified site: Secondary | ICD-10-CM

## 2011-12-25 DIAGNOSIS — J449 Chronic obstructive pulmonary disease, unspecified: Secondary | ICD-10-CM | POA: Insufficient documentation

## 2011-12-25 DIAGNOSIS — F329 Major depressive disorder, single episode, unspecified: Secondary | ICD-10-CM | POA: Insufficient documentation

## 2011-12-25 DIAGNOSIS — Z862 Personal history of diseases of the blood and blood-forming organs and certain disorders involving the immune mechanism: Secondary | ICD-10-CM | POA: Insufficient documentation

## 2011-12-25 HISTORY — DX: Bronchitis, not specified as acute or chronic: J40

## 2011-12-25 LAB — CBC WITH DIFFERENTIAL/PLATELET
Basophils Absolute: 0 10*3/uL (ref 0.0–0.1)
Eosinophils Absolute: 0 10*3/uL (ref 0.0–0.7)
Eosinophils Relative: 0 % (ref 0–5)
Lymphocytes Relative: 6 % — ABNORMAL LOW (ref 12–46)
MCH: 20.8 pg — ABNORMAL LOW (ref 26.0–34.0)
MCV: 70.3 fL — ABNORMAL LOW (ref 78.0–100.0)
Neutrophils Relative %: 86 % — ABNORMAL HIGH (ref 43–77)
Platelets: 337 10*3/uL (ref 150–400)
RDW: 17.9 % — ABNORMAL HIGH (ref 11.5–15.5)
WBC: 10.3 10*3/uL (ref 4.0–10.5)

## 2011-12-25 LAB — BASIC METABOLIC PANEL
CO2: 24 mEq/L (ref 19–32)
Calcium: 9.1 mg/dL (ref 8.4–10.5)
Creatinine, Ser: 0.95 mg/dL (ref 0.50–1.35)
GFR calc non Af Amer: 77 mL/min — ABNORMAL LOW (ref >90)

## 2011-12-25 NOTE — ED Notes (Signed)
Pt not cooperating with discharge, wants to speak with MD

## 2011-12-25 NOTE — ED Notes (Signed)
Pt. Is not able to use the restroom at this time, but there is a urinal at the bedside.  

## 2011-12-25 NOTE — ED Notes (Signed)
Pt reports he has been vomiting. MD Caporossi notified, wrote pt rx for phenegran. Pt reports having "no where to go" - given bus pass. Pt still would not leave, security and [employer] to escort pt out of ED

## 2011-12-25 NOTE — ED Notes (Signed)
Patient transported to X-ray 

## 2011-12-25 NOTE — ED Notes (Addendum)
Pt reports hx of syncope (undiagnosed), felt like he was going to have syncope episode this morning. [employer] at scene, called ems. Pt denies symptoms now

## 2011-12-25 NOTE — ED Notes (Signed)
Pt. Is aware that we need urine, but is unable to go at this time.

## 2011-12-25 NOTE — ED Provider Notes (Addendum)
History     CSN: [id number]  Arrival date & time 12/25/11  1008   First MD Initiated Contact with Patient 12/25/11 1057      Chief Complaint  Patient presents with  . Generalized Body Aches    pt reports constant body aches    (Consider location/radiation/quality/duration/timing/severity/associated sxs/prior treatment) The history is provided by the patient.   78 year old, disheveled, male, presents emergency department complaining of generalized body pain.  He denies focal pain.  He denies cough, fevers, chills, nausea, vomiting, diarrhea.  He is homeless and has a history of alcohol abuse  Past Medical History  Diagnosis Date  . COPD (chronic obstructive pulmonary disease)   . Chronic back pain   . Pneumonia     01/2011 - CAP vs aspiration pneuonmia  . Depression   . PONV (postoperative nausea and vomiting)   . Hyponatremia     Previously felt secondary to SIADH  . Hypertension   . Upper GI bleed     01/2011 with EGD showing severe candida esophagitis and duodenal bulb erosion  . Anemia     In the setting of UGI bleed 01/2011 requiring blood transfusion  . Homelessness   . Dyspnea     Chronic, thought due to COPD. Echo 02/2010 with EF 30-35%, nuclear study negative, then repeat echo 03/2011 did not show systolic dysfxn, so unclear if truly HF  . Bronchitis     Past Surgical History  Procedure Date  . Tonsillectomy   . Vasectomy   . Appendectomy   . Tonsillectomy   . Colonoscopy   . Esophagogastroduodenoscopy   . Esophagogastroduodenoscopy 02/03/2011    Procedure: ESOPHAGOGASTRODUODENOSCOPY (EGD);  Surgeon: Charna Elizabeth, MD;  Location: [facility];  Service: Endoscopy;  Laterality: N/A;  . Esophagogastroduodenoscopy 02/03/2011    Procedure: ESOPHAGOGASTRODUODENOSCOPY (EGD);  Surgeon: Charna Elizabeth, MD;  Location: [facility];  Service: Endoscopy;  Laterality: N/A;    Family History  Problem Relation Age of Onset  . Coronary artery disease Father     Starting in  his 58's. Died of massive MI at age 63.  . Schizophrenia Brother   . Coronary artery disease Sister     MI at age 64  . Alzheimer's disease Mother     History  Substance Use Topics  . Smoking status: Current Some Day Smoker -- 1.0 packs/day for 60 years    Types: Cigarettes  . Smokeless tobacco: Never Used     Comment: Since age 36  . Alcohol Use: Yes     Comment: 174 ml of scotch weekly      Review of Systems  Constitutional: Negative for fever.  HENT: Negative for neck pain.   Eyes: Negative for redness.  Respiratory: Negative for cough and shortness of breath.   Cardiovascular: Negative for chest pain.  Gastrointestinal: Negative for nausea, vomiting and diarrhea.  Musculoskeletal: Positive for myalgias. Negative for back pain.  Skin: Negative for rash.  Neurological: Negative for headaches.  Psychiatric/Behavioral: Negative for confusion.  All other systems reviewed and are negative.    Allergies  Penicillins  Home Medications   Current Outpatient Rx  Name  Route  Sig  Dispense  Refill  . FUROSEMIDE 20 MG PO TABS   Oral   Take 1 tablet (20 mg total) by mouth daily.   30 tablet   1   . LISINOPRIL 2.5 MG PO TABS   Oral   Take 1 tablet (2.5 mg total) by mouth daily.   30 tablet  1   . METOPROLOL TARTRATE 12.5 MG HALF TABLET   Oral   Take 0.5 tablets (12.5 mg total) by mouth 2 (two) times daily.   60 tablet   0   . OMEPRAZOLE 20 MG PO CPDR   Oral   Take 1 capsule (20 mg total) by mouth daily.           BP 124/59  Pulse 81  Temp 97.5 F (36.4 C) (Oral)  Resp 16  Ht 5\' 7"  (1.702 m)  Wt 133 lb (60.328 kg)  BMI 20.83 kg/m2  SpO2 99%  Physical Exam  Nursing note and vitals reviewed. Constitutional: He is oriented to person, place, and time. No distress.       Unkempt with very poor hygiene  HENT:  Head: Normocephalic and atraumatic.  Eyes: Conjunctivae normal and EOM are normal.  Neck: Normal range of motion. Neck supple.    Cardiovascular: Normal rate and normal heart sounds.   No murmur heard. Pulmonary/Chest: Effort normal and breath sounds normal. No respiratory distress.  Abdominal: Soft. He exhibits no distension. There is no tenderness.  Musculoskeletal: Normal range of motion.  Neurological: He is alert and oriented to person, place, and time. No cranial nerve deficit.  Skin: Skin is warm and dry.  Psychiatric: He has a normal mood and affect. Thought content normal.    ED Course  Procedures (including critical care time)  Labs Reviewed  BASIC METABOLIC PANEL - Abnormal; Notable for the following:    Sodium 133 (*)     GFR calc non Af Amer 77 (*)     GFR calc Af Amer 89 (*)     All other components within normal limits  CBC WITH DIFFERENTIAL - Abnormal; Notable for the following:    Hemoglobin 9.1 (*)     HCT 30.7 (*)     MCV 70.3 (*)     MCH 20.8 (*)     MCHC 29.6 (*)     RDW 17.9 (*)     Neutrophils Relative 86 (*)     Neutro Abs 8.8 (*)     Lymphocytes Relative 6 (*)     Lymphs Abs 0.6 (*)     All other components within normal limits  ETHANOL  URINALYSIS, ROUTINE W REFLEX MICROSCOPIC   Dg Chest 2 View  12/25/2011  *RADIOLOGY REPORT*  Clinical Data: Cough, fever  CHEST - 2 VIEW  Comparison: 05/04/2011  Findings: The heart and pulmonary vascularity are stable.  The lungs are well-aerated bilaterally and demonstrate some patchy interstitial changes.  There is thickening of the fissure on the right.  Additionally some patchy increased density is noted in the right mid lung.  This is not well seen on the lateral projection.  IMPRESSION: Vague area of increased density in the right mid lung which is not well evaluated on the lateral projection. This may represent an early infiltrate.  Follow-up films following appropriate therapy are recommended.   Original Report Authenticated By: Alcide Clever, M.D.      No diagnosis found.    MDM  myalgias        Cheri Guppy,  MD 12/25/11 1238  Cheri Guppy, MD 12/25/11 [phone]

## 2011-12-25 NOTE — ED Notes (Signed)
PER PTAR- Called to scene by [employer]. Pt alert, [employer] noted odor of alcohol, pt c/o" having a cold" Pt ambulated to room without assist

## 2012-01-09 ENCOUNTER — Emergency Department (HOSPITAL_COMMUNITY)
Admission: EM | Admit: 2012-01-09 | Discharge: 2012-01-10 | Disposition: A | Discharge status: Home / Self Care | Payer: Medicare Other | Attending: Emergency Medicine | Admitting: Emergency Medicine

## 2012-01-09 DIAGNOSIS — J4489 Other specified chronic obstructive pulmonary disease: Secondary | ICD-10-CM | POA: Insufficient documentation

## 2012-01-09 DIAGNOSIS — Z9889 Other specified postprocedural states: Secondary | ICD-10-CM | POA: Insufficient documentation

## 2012-01-09 DIAGNOSIS — Z8659 Personal history of other mental and behavioral disorders: Secondary | ICD-10-CM | POA: Insufficient documentation

## 2012-01-09 DIAGNOSIS — F17200 Nicotine dependence, unspecified, uncomplicated: Secondary | ICD-10-CM | POA: Insufficient documentation

## 2012-01-09 DIAGNOSIS — Z862 Personal history of diseases of the blood and blood-forming organs and certain disorders involving the immune mechanism: Secondary | ICD-10-CM | POA: Insufficient documentation

## 2012-01-09 DIAGNOSIS — R112 Nausea with vomiting, unspecified: Secondary | ICD-10-CM

## 2012-01-09 DIAGNOSIS — J449 Chronic obstructive pulmonary disease, unspecified: Secondary | ICD-10-CM | POA: Insufficient documentation

## 2012-01-09 DIAGNOSIS — I10 Essential (primary) hypertension: Secondary | ICD-10-CM | POA: Insufficient documentation

## 2012-01-09 DIAGNOSIS — Z8719 Personal history of other diseases of the digestive system: Secondary | ICD-10-CM | POA: Insufficient documentation

## 2012-01-09 LAB — COMPREHENSIVE METABOLIC PANEL
ALT: 12 U/L (ref 0–53)
AST: 21 U/L (ref 0–37)
Calcium: 9.1 mg/dL (ref 8.4–10.5)
GFR calc Af Amer: 66 mL/min — ABNORMAL LOW (ref >90)
Sodium: 139 mEq/L (ref 135–145)
Total Protein: 7.1 g/dL (ref 6.0–8.3)

## 2012-01-09 LAB — CBC WITH DIFFERENTIAL/PLATELET
Basophils Absolute: 0.1 10*3/uL (ref 0.0–0.1)
Basophils Relative: 0 % (ref 0–1)
Eosinophils Absolute: 0.1 10*3/uL (ref 0.0–0.7)
Eosinophils Relative: 1 % (ref 0–5)
MCH: 21 pg — ABNORMAL LOW (ref 26.0–34.0)
MCV: 70.2 fL — ABNORMAL LOW (ref 78.0–100.0)
Platelets: 484 10*3/uL — ABNORMAL HIGH (ref 150–400)
RDW: 17.2 % — ABNORMAL HIGH (ref 11.5–15.5)

## 2012-01-09 MED ORDER — PROMETHAZINE HCL 50 MG RE SUPP: 50.0000 mg | Freq: Four times a day (QID) | RECTAL | Status: DC | PRN

## 2012-01-09 MED ORDER — SODIUM CHLORIDE 0.9 % IV BOLUS (SEPSIS)
1000.0000 mL | Freq: Once | INTRAVENOUS | Status: AC
  Administered 2012-01-09: 1000 mL via INTRAVENOUS

## 2012-01-09 NOTE — ED Notes (Signed)
Pt states "I have broken ribs, foot, heart, collar bone and hands. I have been throwing up for the last year. I pass out all the time, and no one knows why. I don't drink." Pt cooperating, follows commands. Pt changed into paper scrubs.

## 2012-01-09 NOTE — ED Notes (Signed)
Per EMS pt has a knife. Security made aware and at bedside.

## 2012-01-09 NOTE — ED Notes (Addendum)
Per EMS: Pt found in his tent outside in a pool of vomit. Pt refused any tx. No vital signs. Per EMS pt was extremely combative, belligerent and threatening.

## 2012-01-09 NOTE — ED Notes (Signed)
Given happy meal/drink.

## 2012-01-09 NOTE — ED Notes (Signed)
Alert, interactive, calm, NAD, resps e/u, speaking in clear complete sentences, denies pain or other sx, requests food and drink.

## 2012-01-10 NOTE — ED Notes (Signed)
D/c'd with [employer] & security present. Out to w/r. Given socks. offerred w/c. [employer] to assist as able with coordinating ride [facility]. Pt no agreeable. Dressing self.

## 2012-01-10 NOTE — ED Provider Notes (Signed)
History     CSN: [id number]  Arrival date & time 01/09/12  2041   First MD Initiated Contact with Patient 01/09/12 2118      Chief Complaint  Patient presents with  . Emesis    (Consider location/radiation/quality/duration/timing/severity/associated sxs/prior treatment) HPI Comments: Pt comes in with cc of nausea and emesis. Pt has no medical hx, states that over the past few days he has been having intermittent nausea with emesis - today, however, he has had about 8 episodes of emesis, mostly all after po intake. He has no abd pain, no chest pain, sob. Pt has no hx of abd surgery, does have an appetite.   Patient is a 78 y.o. male presenting with vomiting. The history is provided by the patient.  Emesis  Pertinent negatives include no arthralgias, no chills, no cough, no fever and no headaches.    Past Medical History  Diagnosis Date  . COPD (chronic obstructive pulmonary disease)   . Chronic back pain   . Pneumonia     01/2011 - CAP vs aspiration pneuonmia  . Depression   . PONV (postoperative nausea and vomiting)   . Hyponatremia     Previously felt secondary to SIADH  . Hypertension   . Upper GI bleed     01/2011 with EGD showing severe candida esophagitis and duodenal bulb erosion  . Anemia     In the setting of UGI bleed 01/2011 requiring blood transfusion  . Homelessness   . Dyspnea     Chronic, thought due to COPD. Echo 02/2010 with EF 30-35%, nuclear study negative, then repeat echo 03/2011 did not show systolic dysfxn, so unclear if truly HF  . Bronchitis     Past Surgical History  Procedure Date  . Tonsillectomy   . Vasectomy   . Appendectomy   . Tonsillectomy   . Colonoscopy   . Esophagogastroduodenoscopy   . Esophagogastroduodenoscopy 02/03/2011    Procedure: ESOPHAGOGASTRODUODENOSCOPY (EGD);  Surgeon: Charna Elizabeth, MD;  Location: [facility];  Service: Endoscopy;  Laterality: N/A;  . Esophagogastroduodenoscopy 02/03/2011    Procedure:  ESOPHAGOGASTRODUODENOSCOPY (EGD);  Surgeon: Charna Elizabeth, MD;  Location: [facility];  Service: Endoscopy;  Laterality: N/A;    Family History  Problem Relation Age of Onset  . Coronary artery disease Father     Starting in his 62's. Died of massive MI at age 58.  . Schizophrenia Brother   . Coronary artery disease Sister     MI at age 67  . Alzheimer's disease Mother     History  Substance Use Topics  . Smoking status: Current Some Day Smoker -- 1.0 packs/day for 60 years    Types: Cigarettes  . Smokeless tobacco: Never Used     Comment: Since age 40  . Alcohol Use: Yes     Comment: 174 ml of scotch weekly      Review of Systems  Constitutional: Negative for fever, chills and activity change.  HENT: Negative for neck pain.   Eyes: Negative for visual disturbance.  Respiratory: Negative for cough, chest tightness and shortness of breath.   Cardiovascular: Negative for chest pain.  Gastrointestinal: Positive for nausea and vomiting. Negative for abdominal distention.  Genitourinary: Negative for dysuria, enuresis and difficulty urinating.  Musculoskeletal: Negative for arthralgias.  Neurological: Negative for dizziness, light-headedness and headaches.  Psychiatric/Behavioral: Negative for confusion.    Allergies  Penicillins  Home Medications   Current Outpatient Rx  Name  Route  Sig  Dispense  Refill  .  PROMETHAZINE HCL 50 MG RE SUPP   Rectal   Place 1 suppository (50 mg total) rectally every 6 (six) hours as needed for nausea.   12 each   0     BP 163/69  Pulse 89  Temp 98.3 F (36.8 C) (Oral)  Resp 18  SpO2 100%  Physical Exam  Nursing note and vitals reviewed. Constitutional: He is oriented to person, place, and time. He appears well-developed.  HENT:  Head: Normocephalic and atraumatic.  Eyes: Conjunctivae normal and EOM are normal. Pupils are equal, round, and reactive to light.  Neck: Normal range of motion. Neck supple.  Cardiovascular: Normal  rate, regular rhythm and normal heart sounds.   Pulmonary/Chest: Effort normal and breath sounds normal. No respiratory distress. He has no wheezes.  Abdominal: Soft. Bowel sounds are normal. He exhibits no distension. There is no tenderness. There is no rebound and no guarding.  Neurological: He is alert and oriented to person, place, and time.  Skin: Skin is warm.    ED Course  Procedures (including critical care time)  Labs Reviewed  CBC WITH DIFFERENTIAL - Abnormal; Notable for the following:    WBC 11.9 (*)     RBC 4.20 (*)     Hemoglobin 8.8 (*)     HCT 29.5 (*)     MCV 70.2 (*)     MCH 21.0 (*)     MCHC 29.8 (*)     RDW 17.2 (*)     Platelets 484 (*)     Neutrophils Relative 87 (*)     Neutro Abs 10.4 (*)     Lymphocytes Relative 7 (*)     All other components within normal limits  COMPREHENSIVE METABOLIC PANEL - Abnormal; Notable for the following:    Glucose, Bld 117 (*)     BUN 33 (*)     Albumin 3.3 (*)     Total Bilirubin 0.2 (*)     GFR calc non Af Amer 57 (*)     GFR calc Af Amer 66 (*)     All other components within normal limits  MAGNESIUM  TROPONIN I  URINALYSIS, ROUTINE W REFLEX MICROSCOPIC   No results found.   1. Nausea and vomiting       MDM  Pt comes in with cc of nausea and emesis.  He has no other complains, and appears that he has no complications clinically from the emesis. We will check basic labs, and get ekg with troponin.   12:07 AM E'lytes are normal, vitals are fine, and exam didn't show any hard evidence of dehydration. No emesis in the ED. Will discharge. I instructed patient to try soft diet, and advance as tolerated.  He needs pcp follow up for gastroparesis etc workup...will provide the resources.       Derwood Kaplan, MD 01/10/12 0009

## 2012-01-10 NOTE — ED Notes (Signed)
VSS, tolerated food and drink. Pt updated with plan.  Not agreeable with plan. Adament about not leaving.

## 2012-01-28 ENCOUNTER — Inpatient Hospital Stay (HOSPITAL_COMMUNITY)
Admission: EM | Admit: 2012-01-28 | Discharge: 2012-01-31 | DRG: 812 | Disposition: A | Discharge status: Skilled Nursing Facility | Payer: Medicare Other | Attending: Internal Medicine | Admitting: Internal Medicine

## 2012-01-28 ENCOUNTER — Emergency Department (HOSPITAL_COMMUNITY): Payer: Medicare Other

## 2012-01-28 ENCOUNTER — Other Ambulatory Visit: Payer: Self-pay

## 2012-01-28 ENCOUNTER — Encounter (HOSPITAL_COMMUNITY): Payer: Self-pay

## 2012-01-28 DIAGNOSIS — G8929 Other chronic pain: Secondary | ICD-10-CM

## 2012-01-28 DIAGNOSIS — D509 Iron deficiency anemia, unspecified: Principal | ICD-10-CM | POA: Diagnosis present

## 2012-01-28 DIAGNOSIS — Z5900 Homelessness unspecified: Secondary | ICD-10-CM

## 2012-01-28 DIAGNOSIS — R0989 Other specified symptoms and signs involving the circulatory and respiratory systems: Secondary | ICD-10-CM | POA: Diagnosis present

## 2012-01-28 DIAGNOSIS — J4489 Other specified chronic obstructive pulmonary disease: Secondary | ICD-10-CM | POA: Diagnosis present

## 2012-01-28 DIAGNOSIS — K620 Anal polyp: Secondary | ICD-10-CM | POA: Diagnosis present

## 2012-01-28 DIAGNOSIS — J449 Chronic obstructive pulmonary disease, unspecified: Secondary | ICD-10-CM

## 2012-01-28 DIAGNOSIS — F1020 Alcohol dependence, uncomplicated: Secondary | ICD-10-CM

## 2012-01-28 DIAGNOSIS — D62 Acute posthemorrhagic anemia: Secondary | ICD-10-CM

## 2012-01-28 DIAGNOSIS — R0609 Other forms of dyspnea: Secondary | ICD-10-CM | POA: Diagnosis present

## 2012-01-28 DIAGNOSIS — R195 Other fecal abnormalities: Secondary | ICD-10-CM | POA: Diagnosis present

## 2012-01-28 DIAGNOSIS — D126 Benign neoplasm of colon, unspecified: Secondary | ICD-10-CM | POA: Diagnosis present

## 2012-01-28 DIAGNOSIS — F17200 Nicotine dependence, unspecified, uncomplicated: Secondary | ICD-10-CM | POA: Diagnosis present

## 2012-01-28 DIAGNOSIS — Z720 Tobacco use: Secondary | ICD-10-CM

## 2012-01-28 LAB — CBC
HCT: 24.1 % — ABNORMAL LOW (ref 39.0–52.0)
MCV: 68.7 fL — ABNORMAL LOW (ref 78.0–100.0)
RBC: 3.51 MIL/uL — ABNORMAL LOW (ref 4.22–5.81)
WBC: 10.6 10*3/uL — ABNORMAL HIGH (ref 4.0–10.5)

## 2012-01-28 LAB — BASIC METABOLIC PANEL
BUN: 33 mg/dL — ABNORMAL HIGH (ref 6–23)
CO2: 22 mEq/L (ref 19–32)
Chloride: 99 mEq/L (ref 96–112)
Creatinine, Ser: 1.18 mg/dL (ref 0.50–1.35)

## 2012-01-28 MED ORDER — IPRATROPIUM BROMIDE 0.02 % IN SOLN: 0.5000 mg | RESPIRATORY_TRACT | Status: DC | PRN

## 2012-01-28 MED ORDER — HYDROCODONE-ACETAMINOPHEN 5-325 MG PO TABS
1.0000 | ORAL_TABLET | ORAL | Status: DC | PRN
  Administered 2012-01-29: 1 via ORAL
  Filled 2012-01-28: qty 1

## 2012-01-28 MED ORDER — ACETAMINOPHEN 325 MG PO TABS: 650.0000 mg | ORAL_TABLET | Freq: Four times a day (QID) | ORAL | Status: DC | PRN

## 2012-01-28 MED ORDER — ONDANSETRON HCL 4 MG PO TABS: 4.0000 mg | ORAL_TABLET | Freq: Four times a day (QID) | ORAL | Status: DC | PRN

## 2012-01-28 MED ORDER — NICOTINE 14 MG/24HR TD PT24
14.0000 mg | MEDICATED_PATCH | Freq: Every day | TRANSDERMAL | Status: DC
  Administered 2012-01-29 – 2012-01-30 (×2): 14 mg via TRANSDERMAL
  Filled 2012-01-28 (×3): qty 1

## 2012-01-28 MED ORDER — ACETAMINOPHEN 650 MG RE SUPP: 650.0000 mg | Freq: Four times a day (QID) | RECTAL | Status: DC | PRN

## 2012-01-28 MED ORDER — POTASSIUM CHLORIDE IN NACL 20-0.9 MEQ/L-% IV SOLN
INTRAVENOUS | Status: DC
  Administered 2012-01-28 – 2012-01-29 (×2): 1000 mL via INTRAVENOUS
  Filled 2012-01-28 (×5): qty 1000

## 2012-01-28 MED ORDER — ALUM & MAG HYDROXIDE-SIMETH 200-200-20 MG/5ML PO SUSP: 30.0000 mL | Freq: Four times a day (QID) | ORAL | Status: DC | PRN

## 2012-01-28 MED ORDER — ONDANSETRON HCL 4 MG/2ML IJ SOLN: 4.0000 mg | Freq: Four times a day (QID) | INTRAMUSCULAR | Status: DC | PRN

## 2012-01-28 MED ORDER — SODIUM CHLORIDE 0.9 % IV SOLN
Freq: Once | INTRAVENOUS | Status: AC
  Administered 2012-01-28: 22:00:00 via INTRAVENOUS

## 2012-01-28 MED ORDER — SENNOSIDES-DOCUSATE SODIUM 8.6-50 MG PO TABS
1.0000 | ORAL_TABLET | Freq: Every evening | ORAL | Status: DC | PRN
  Filled 2012-01-28: qty 1

## 2012-01-28 MED ORDER — ALBUTEROL SULFATE (5 MG/ML) 0.5% IN NEBU: 2.5000 mg | INHALATION_SOLUTION | RESPIRATORY_TRACT | Status: DC | PRN

## 2012-01-28 MED ORDER — ACETAMINOPHEN-CODEINE #3 300-30 MG PO TABS: 1.0000 | ORAL_TABLET | Freq: Four times a day (QID) | ORAL | Status: DC | PRN

## 2012-01-28 MED ORDER — PANTOPRAZOLE SODIUM 40 MG IV SOLR
40.0000 mg | Freq: Every day | INTRAVENOUS | Status: DC
  Administered 2012-01-28 – 2012-01-29 (×2): 40 mg via INTRAVENOUS
  Filled 2012-01-28 (×3): qty 40

## 2012-01-28 NOTE — ED Provider Notes (Addendum)
History     CSN: [id number]  Arrival date & time 01/28/12  1455   First MD Initiated Contact with Patient 01/28/12 1604      Chief Complaint  Patient presents with  . Pain    (Consider location/radiation/quality/duration/timing/severity/associated sxs/prior treatment) HPI The patient presents with 2 main complaints.  Initially he complains of generalized pain.  When he tries to satisfy She complains of generalized weakness, of unclear onset.  He states the weakness is significant enough that he is too tired to walk.  He denies syncope.  He denies trauma.  He denies melena or black stool.  The pain is generalized, but worse in his feet.  The pain is sore, worse with weightbearing. Notably, the patient is homeless, lives in a tent camp.  He states that he has been living in poor condition for a prolonged amount of time. Past Medical History  Diagnosis Date  . COPD (chronic obstructive pulmonary disease)   . Chronic back pain   . Pneumonia     01/2011 - CAP vs aspiration pneuonmia  . Depression   . PONV (postoperative nausea and vomiting)   . Hyponatremia     Previously felt secondary to SIADH  . Hypertension   . Upper GI bleed     01/2011 with EGD showing severe candida esophagitis and duodenal bulb erosion  . Anemia     In the setting of UGI bleed 01/2011 requiring blood transfusion  . Homelessness   . Dyspnea     Chronic, thought due to COPD. Echo 02/2010 with EF 30-35%, nuclear study negative, then repeat echo 03/2011 did not show systolic dysfxn, so unclear if truly HF  . Bronchitis     Past Surgical History  Procedure Date  . Tonsillectomy   . Vasectomy   . Appendectomy   . Tonsillectomy   . Colonoscopy   . Esophagogastroduodenoscopy   . Esophagogastroduodenoscopy 02/03/2011    Procedure: ESOPHAGOGASTRODUODENOSCOPY (EGD);  Surgeon: Charna Elizabeth, MD;  Location: [facility];  Service: Endoscopy;  Laterality: N/A;  . Esophagogastroduodenoscopy 02/03/2011    Procedure:  ESOPHAGOGASTRODUODENOSCOPY (EGD);  Surgeon: Charna Elizabeth, MD;  Location: [facility];  Service: Endoscopy;  Laterality: N/A;    Family History  Problem Relation Age of Onset  . Coronary artery disease Father     Starting in his 25's. Died of massive MI at age 40.  . Schizophrenia Brother   . Coronary artery disease Sister     MI at age 36  . Alzheimer's disease Mother     History  Substance Use Topics  . Smoking status: Current Some Day Smoker -- 1.0 packs/day for 60 years    Types: Cigarettes  . Smokeless tobacco: Never Used     Comment: Since age 47  . Alcohol Use: Yes     Comment: 174 ml of scotch weekly      Review of Systems  Constitutional:       Per HPI, otherwise negative  HENT:       Per HPI, otherwise negative  Eyes: Negative.   Respiratory:       Per HPI, otherwise negative  Cardiovascular:       Per HPI, otherwise negative  Gastrointestinal: Negative for vomiting.  Genitourinary: Negative.   Musculoskeletal:       Per HPI, otherwise negative  Skin: Negative.   Neurological: Negative for syncope.    Allergies  Penicillins  Home Medications   Current Outpatient Rx  Name  Route  Sig  Dispense  Refill  . ACETAMINOPHEN-CODEINE #3 300-30 MG PO TABS   Oral   Take 1-2 tablets by mouth every 6 (six) hours as needed for pain.   15 tablet   0     BP 108/54  Pulse 89  Temp 97.8 F (36.6 C) (Oral)  Resp 16  SpO2 100%  Physical Exam  Nursing note and vitals reviewed. Constitutional: He is oriented to person, place, and time. He appears ill.       Unkempt elderly appearing male  HENT:  Head: Normocephalic and atraumatic.       bearded  Eyes: Conjunctivae normal and EOM are normal.  Cardiovascular: Normal rate and regular rhythm.   Pulmonary/Chest: Effort normal. No stridor. No respiratory distress.  Abdominal: He exhibits no distension.  Genitourinary: Rectal exam shows no external hemorrhoid and no fissure. Guaiac positive stool.        Grossly negative, but Hemoccult-positive stool  Musculoskeletal: He exhibits no edema.  Neurological: He is alert and oriented to person, place, and time. No cranial nerve deficit. He exhibits normal muscle tone. Coordination normal.  Skin: Skin is warm and dry. No rash noted.  Psychiatric: He has a normal mood and affect. Cognition and memory are impaired.    ED Course  Procedures (including critical care time)  Labs Reviewed  CBC - Abnormal; Notable for the following:    WBC 10.6 (*)     RBC 3.51 (*)     Hemoglobin 7.1 (*)     HCT 24.1 (*)     MCV 68.7 (*)     MCH 20.2 (*)     MCHC 29.5 (*)     RDW 16.8 (*)     Platelets 471 (*)     All other components within normal limits  BASIC METABOLIC PANEL - Abnormal; Notable for the following:    Sodium 131 (*)     Glucose, Bld 100 (*)     BUN 33 (*)     GFR calc non Af Amer 56 (*)     GFR calc Af Amer 65 (*)     All other components within normal limits   Dg Chest 2 View  01/28/2012  *RADIOLOGY REPORT*  Clinical Data: General pain and weakness; hx bronchitis and MI; smoker  CHEST - 2 VIEW  Comparison: 12/25/2011  Findings: Old rib and clavicular fractures noted.  The aortic arch atherosclerotic calcification noted with tortuous descending thoracic aorta.  L2 compression fracture noted, with about 50% loss of height. Faint reticular interstitial accentuation noted, faint reticular interstitial accentuation noted bilaterally.  Borderline cardiomegaly.  No overt airspace edema.  Mild central airway thickening.  A round density projecting between the anterior portions of the left fifth and sixth ribs is noted.  This could represent a button or nipple shadow.  There are adjacent rib fractures but I would doubt that this is due to right rib fracture.  A true pulmonary nodule is difficult to exclude.  IMPRESSION:  1.  Mild central airway thickening and interstitial accentuation, without overt airspace opacity.  Appearance could be due to  borderline interstitial edema or bronchitis/mild atypical pneumonia. 2. Left basilar pulmonary nodule, versus nipple marker or button. I see other buttons projecting over the chest, for example of the right lateral rib cage.  Repeat frontal radiography of the chest with nipple markers and with no buttons in the clothing is recommended. 3.  Chronic L2 compression fracture. 4.  Atherosclerotic and tortuous thoracic aorta.   Original Report Authenticated  By: Gaylyn Rong, M.D.      1. Chronic pain   Update: patient seems comfortable  Pulse ox 99% room air normal   Date: 01/29/2012  Rate: 71  Rhythm: normal sinus rhythm  QRS Axis: normal  Intervals: QT prolonged  ST/T Wave abnormalities: normal  Conduction Disutrbances:none - PVC  Narrative Interpretation:   Old EKG Reviewed: unchanged Unremarkable ECG   A review of the patient's chart demonstrates a prior history of GI bleed, with esophagitis, possibly one year ago. The patient was previously transfused. MDM  This elderly, listless male presents with concerns of generalized weakness, generalized pain.  On exam he is awake and alert, with unremarkable vitals.  However, given the description of weakness, labs were performed, and the patient was found to have evidence of an occult GI bleed.  Given this finding I discussed his presentation with our gastroenterologist, Dr. Leone Payor, and initiated transfusion.  The patient required admission for further evaluation and management.  CRITICAL CARE Performed by: Gerhard Munch   Total critical care time: 35  Critical care time was exclusive of separately billable procedures and treating other patients.  Critical care was necessary to treat or prevent imminent or life-threatening deterioration.  Critical care was time spent personally by me on the following activities: development of treatment plan with patient and/or surrogate as well as nursing, discussions with consultants, evaluation  of patient's response to treatment, examination of patient, obtaining history from patient or surrogate, ordering and performing treatments and interventions, ordering and review of laboratory studies, ordering and review of radiographic studies, pulse oximetry and re-evaluation of patient's condition.       Gerhard Munch, MD 01/29/12 0000  Gerhard Munch, MD 01/29/12 [phone]

## 2012-01-28 NOTE — ED Provider Notes (Signed)
Please see my personal note.  Gerhard Munch, MD 01/28/12 [phone]

## 2012-01-28 NOTE — ED Provider Notes (Signed)
History     CSN: [id number]  Arrival date & time 01/28/12  1455   First MD Initiated Contact with Patient 01/28/12 1604      Chief Complaint  Patient presents with  . Pain    (Consider location/radiation/quality/duration/timing/severity/associated sxs/prior treatment) HPI Comments: 78 year old male with a history of chronic pain presents to the emergency department complaining of "pain all over". Pain began yesterday, worse in his right ankle. States it is swelling up. Also complaining of pain in his bilateral hips and his back. He felt as if his legs were going to give out he was walking today. He feels unsteady. No known injury or trauma, he just states this is his general pain and heat needs medicine for. Currently pain rated 6/10. He has not tried any alleviating factors. States "a nice cuff of hot coffee would probably take away all of my pain".  The history is provided by the patient.    Past Medical History  Diagnosis Date  . COPD (chronic obstructive pulmonary disease)   . Chronic back pain   . Pneumonia     01/2011 - CAP vs aspiration pneuonmia  . Depression   . PONV (postoperative nausea and vomiting)   . Hyponatremia     Previously felt secondary to SIADH  . Hypertension   . Upper GI bleed     01/2011 with EGD showing severe candida esophagitis and duodenal bulb erosion  . Anemia     In the setting of UGI bleed 01/2011 requiring blood transfusion  . Homelessness   . Dyspnea     Chronic, thought due to COPD. Echo 02/2010 with EF 30-35%, nuclear study negative, then repeat echo 03/2011 did not show systolic dysfxn, so unclear if truly HF  . Bronchitis     Past Surgical History  Procedure Date  . Tonsillectomy   . Vasectomy   . Appendectomy   . Tonsillectomy   . Colonoscopy   . Esophagogastroduodenoscopy   . Esophagogastroduodenoscopy 02/03/2011    Procedure: ESOPHAGOGASTRODUODENOSCOPY (EGD);  Surgeon: Charna Elizabeth, MD;  Location: [facility];  Service:  Endoscopy;  Laterality: N/A;  . Esophagogastroduodenoscopy 02/03/2011    Procedure: ESOPHAGOGASTRODUODENOSCOPY (EGD);  Surgeon: Charna Elizabeth, MD;  Location: [facility];  Service: Endoscopy;  Laterality: N/A;    Family History  Problem Relation Age of Onset  . Coronary artery disease Father     Starting in his 40's. Died of massive MI at age 32.  . Schizophrenia Brother   . Coronary artery disease Sister     MI at age 37  . Alzheimer's disease Mother     History  Substance Use Topics  . Smoking status: Current Some Day Smoker -- 1.0 packs/day for 60 years    Types: Cigarettes  . Smokeless tobacco: Never Used     Comment: Since age 29  . Alcohol Use: Yes     Comment: 174 ml of scotch weekly      Review of Systems  Constitutional: Negative for activity change.  HENT: Positive for neck pain.   Respiratory: Negative for shortness of breath.   Musculoskeletal: Positive for myalgias, back pain, arthralgias and gait problem.  Neurological: Negative for numbness.  Psychiatric/Behavioral: Negative for confusion.    Allergies  Penicillins  Home Medications  No current outpatient prescriptions on file.  BP 120/51  Pulse 85  Temp 97.8 F (36.6 C) (Oral)  Resp 16  SpO2 100%  Physical Exam  Constitutional: He is oriented to person, place, and time. No distress.  Unkempt, dirty odor.  Neck: Normal range of motion. Neck supple.  Cardiovascular: Normal rate, regular rhythm, normal heart sounds and intact distal pulses.   Pulmonary/Chest: Effort normal and breath sounds normal.  Musculoskeletal: Normal range of motion. He exhibits tenderness (generalized, mild).       Right ankle: Normal.  Neurological: He is alert and oriented to person, place, and time. No sensory deficit.  Skin:       dirty  Psychiatric: His speech is normal and behavior is normal. His affect is blunt.    ED Course  Procedures (including critical care time)  Labs Reviewed - No data to  display No results found.   1. Chronic pain       MDM  87-year-old male with generalized pain. Prior to discharge I had him get up and walk, however when he said he got very dizzy. He is very unsteady on his feet. Keep exhibits a shuffling gait. I will discuss him with Dr. Jeraldine Loots and move him to the main part of the emergency department.       Trevor Mace, PA-C 01/28/12 1730

## 2012-01-28 NOTE — ED Notes (Signed)
Per EMS, Pt has chronic pain in neck, back, bilateral hips and bilateral legs.  Sts he was "walking today and had to stop, because he thought his legs were going to give out."  Sts legs have become increasingly weak and unsteady.  Denies injury.  Pain score 6/10.

## 2012-01-28 NOTE — ED Notes (Signed)
Patient requesting food, spoke with Dr Jeraldine Loots.  NPO at this time.

## 2012-01-28 NOTE — H&P (Signed)
PCP:   Georganna Skeans, MD   Chief Complaint:  Weakness  HPI: This is 78 year old gentleman who is homeless, he states that he's been feeling weak. He states he normally walks 5-7 mouth daily however, he is now able to walk 100 yards before he starts getting short of breath and weak. He says he is also lightheaded and dizzy. He is passing some dark blood in the stool. The patient denies any significant alcohol use or NSAID use. He does have a history of GI bleeding. Patient is a poor to fair historian. History provided by the patient.  Review of Systems:  The patient denies anorexia, fever, weight loss,, vision loss, decreased hearing, hoarseness, chest pain, syncope, dyspnea on exertion, peripheral edema, balance deficits, hemoptysis, abdominal pain,, severe indigestion/heartburn, hematuria, incontinence, genital sores, muscle weakness, suspicious skin lesions, transient blindness, difficulty walking, depression, unusual weight change, abnormal bleeding, enlarged lymph nodes, angioedema, and breast masses.  Past Medical History: Past Medical History  Diagnosis Date  . COPD (chronic obstructive pulmonary disease)   . Chronic back pain   . Pneumonia     01/2011 - CAP vs aspiration pneuonmia  . Depression   . PONV (postoperative nausea and vomiting)   . Hyponatremia     Previously felt secondary to SIADH  . Hypertension   . Upper GI bleed     01/2011 with EGD showing severe candida esophagitis and duodenal bulb erosion  . Anemia     In the setting of UGI bleed 01/2011 requiring blood transfusion  . Homelessness   . Dyspnea     Chronic, thought due to COPD. Echo 02/2010 with EF 30-35%, nuclear study negative, then repeat echo 03/2011 did not show systolic dysfxn, so unclear if truly HF  . Bronchitis    Past Surgical History  Procedure Date  . Tonsillectomy   . Vasectomy   . Appendectomy   . Tonsillectomy   . Colonoscopy   . Esophagogastroduodenoscopy   . Esophagogastroduodenoscopy  02/03/2011    Procedure: ESOPHAGOGASTRODUODENOSCOPY (EGD);  Surgeon: Charna Elizabeth, MD;  Location: [facility];  Service: Endoscopy;  Laterality: N/A;  . Esophagogastroduodenoscopy 02/03/2011    Procedure: ESOPHAGOGASTRODUODENOSCOPY (EGD);  Surgeon: Charna Elizabeth, MD;  Location: [facility];  Service: Endoscopy;  Laterality: N/A;    Medications: Prior to Admission medications   Medication Sig Start Date End Date Taking? Authorizing Provider  acetaminophen-codeine (TYLENOL #3) 300-30 MG per tablet Take 1-2 tablets by mouth every 6 (six) hours as needed for pain. 01/28/12   Trevor Mace, PA-C    Allergies:   Allergies  Allergen Reactions  . Penicillins Itching    Social History:  reports that he has been smoking Cigarettes.  He has a 60 pack-year smoking history. He has never used smokeless tobacco. He reports that he drinks alcohol. He reports that he does not use illicit drugs. homeless  Family History: Family History  Problem Relation Age of Onset  . Coronary artery disease Father     Starting in his 48's. Died of massive MI at age 58.  . Schizophrenia Brother   . Coronary artery disease Sister     MI at age 9  . Alzheimer's disease Mother     Physical Exam: Filed Vitals:   01/28/12 1814 01/28/12 1815 01/28/12 1949 01/28/12 1950  BP: 108/54 107/54 128/63   Pulse: 89 78    Temp:      TempSrc:      Resp:  23 16   SpO2:  99%  98%  General:  Alert and oriented times three, well developed and nourished, no acute distress, disheveled Eyes: PERRLA, pink conjunctiva, no scleral icterus ENT: Moist oral mucosa, neck supple, no thyromegaly, poor dentition Lungs: clear to ascultation, no wheeze, no crackles, no use of accessory muscles Cardiovascular: regular rate and rhythm, no regurgitation, no gallops, no murmurs. No carotid bruits, no JVD Abdomen: soft, positive BS, non-tender, non-distended, no organomegaly, not an acute abdomen GU: not examined Neuro: CN II - XII  grossly intact, sensation intact Musculoskeletal: strength 5/5 all extremities, no clubbing, cyanosis or edema Skin: no rash, no subcutaneous crepitation, no decubitus Psych: appropriate patient   Labs on Admission:   [facility] 01/28/12 1805  NA 131*  K 3.7  CL 99  CO2 22  GLUCOSE 100*  BUN 33*  CREATININE 1.18  CALCIUM 8.7  MG --  PHOS --     Basename 01/28/12 1805  WBC 10.6*  NEUTROABS --  HGB 7.1*  HCT 24.1*  MCV 68.7*  PLT 471*    Micro Results: No results found for this or any previous visit (from the past 240 hour(s)).   Radiological Exams on Admission: Dg Chest 2 View  01/28/2012  *RADIOLOGY REPORT*  Clinical Data: General pain and weakness; hx bronchitis and MI; smoker  CHEST - 2 VIEW  Comparison: 12/25/2011  Findings: Old rib and clavicular fractures noted.  The aortic arch atherosclerotic calcification noted with tortuous descending thoracic aorta.  L2 compression fracture noted, with about 50% loss of height. Faint reticular interstitial accentuation noted, faint reticular interstitial accentuation noted bilaterally.  Borderline cardiomegaly.  No overt airspace edema.  Mild central airway thickening.  A round density projecting between the anterior portions of the left fifth and sixth ribs is noted.  This could represent a button or nipple shadow.  There are adjacent rib fractures but I would doubt that this is due to right rib fracture.  A true pulmonary nodule is difficult to exclude.  IMPRESSION:  1.  Mild central airway thickening and interstitial accentuation, without overt airspace opacity.  Appearance could be due to borderline interstitial edema or bronchitis/mild atypical pneumonia. 2. Left basilar pulmonary nodule, versus nipple marker or button. I see other buttons projecting over the chest, for example of the right lateral rib cage.  Repeat frontal radiography of the chest with nipple markers and with no buttons in the clothing is recommended. 3.  Chronic  L2 compression fracture. 4.  Atherosclerotic and tortuous thoracic aorta.   Original Report Authenticated By: Gaylyn Rong, M.D.     Assessment/Plan Present on Admission:  . Anemia  Admit to telemetry Sounds like a slow upper GI bleed  Type and cross and transfuse 2 units. Anemia panel ordered IV Protonix daily GI has been consulted by the ER physician Posttransfusion H&H Guaiac stools . COPD (chronic obstructive pulmonary disease) . Tobacco abuse Nicotine patch DuoNeb says needed Homelessness  Full code SCDs for DVT prophylaxis  Myeasha Ballowe 01/28/2012, 8:45 PM

## 2012-01-29 ENCOUNTER — Observation Stay (HOSPITAL_COMMUNITY): Payer: Medicare Other

## 2012-01-29 DIAGNOSIS — R195 Other fecal abnormalities: Secondary | ICD-10-CM | POA: Diagnosis present

## 2012-01-29 DIAGNOSIS — F17200 Nicotine dependence, unspecified, uncomplicated: Secondary | ICD-10-CM

## 2012-01-29 DIAGNOSIS — R0989 Other specified symptoms and signs involving the circulatory and respiratory systems: Secondary | ICD-10-CM

## 2012-01-29 DIAGNOSIS — D509 Iron deficiency anemia, unspecified: Principal | ICD-10-CM

## 2012-01-29 DIAGNOSIS — R0609 Other forms of dyspnea: Secondary | ICD-10-CM | POA: Diagnosis present

## 2012-01-29 DIAGNOSIS — F1020 Alcohol dependence, uncomplicated: Secondary | ICD-10-CM

## 2012-01-29 DIAGNOSIS — J449 Chronic obstructive pulmonary disease, unspecified: Secondary | ICD-10-CM

## 2012-01-29 LAB — CBC
HCT: 28.4 % — ABNORMAL LOW (ref 39.0–52.0)
MCHC: 31.3 g/dL (ref 30.0–36.0)
Platelets: 340 10*3/uL (ref 150–400)
RBC: 3.66 MIL/uL — ABNORMAL LOW (ref 4.22–5.81)
RDW: 19 % — ABNORMAL HIGH (ref 11.5–15.5)
WBC: 7.8 10*3/uL (ref 4.0–10.5)

## 2012-01-29 LAB — IRON AND TIBC
Iron: <10 ug/dL — ABNORMAL LOW (ref 42–135)
UIBC: 385 ug/dL (ref 125–400)

## 2012-01-29 LAB — BASIC METABOLIC PANEL
CO2: 23 mEq/L (ref 19–32)
Calcium: 8.2 mg/dL — ABNORMAL LOW (ref 8.4–10.5)
GFR calc non Af Amer: 69 mL/min — ABNORMAL LOW (ref >90)
Sodium: 135 mEq/L (ref 135–145)

## 2012-01-29 LAB — OCCULT BLOOD X 1 CARD TO LAB, STOOL: Fecal Occult Bld: POSITIVE — AB

## 2012-01-29 LAB — FERRITIN: Ferritin: 6 ng/mL — ABNORMAL LOW (ref 22–322)

## 2012-01-29 MED ORDER — PEG-KCL-NACL-NASULF-NA ASC-C 100 G PO SOLR
1.0000 | Freq: Once | ORAL | Status: AC
  Administered 2012-01-29: 100 g via ORAL
  Filled 2012-01-29: qty 1

## 2012-01-29 NOTE — Consult Note (Signed)
Gastroenterology Consultation  Referring Provider:  [facility] Primary Care Physician:  Georganna Skeans, MD Primary Gastroenterologist:   Gentry Fitz    Reason for Consultation:     Anemia, heme positive stools   HPI: Dale Mcfarland is a 78 y.o. male with a history of COPD with ongoing tobacco use. He also has a history of ETOH abuse, continues to drink " a little" everyday. Patient was admitted Dec 2012 for coffee ground emesis. EGD at that time revealed grade 4 esophagitis, a moderate sized hiatal hernia and erosions of the duodenal bulb. Esophageal brushings compatible with Candida.   Patient admitted yesterday with shortness of breath and weakness. Chest x-ray showed possible interstitial edema or bronchitis/atypical pneumonia. Hemoglobin on admission 7.1. BUN mildly elevated at 33, creatinine normal. Ferritin low at 6. Folate and B12 normal. Hemoglobin 8.4 post 2 units of blood. He denies NSAID use. BMs are normal. Doesn't examine stool for blood. No abdominal pain. No nausea.   Patient lives in a tent. He has two daughters in [place]. He is retired.   No FMH of colon cancer.  Past Medical History  Diagnosis Date  . COPD (chronic obstructive pulmonary disease)   . Chronic back pain   . Pneumonia     01/2011 - CAP vs aspiration pneuonmia  . Depression   . PONV (postoperative nausea and vomiting)   . Hyponatremia     Previously felt secondary to SIADH  . Hypertension   . Upper GI bleed     01/2011 with EGD showing severe candida esophagitis and duodenal bulb erosion  . Anemia     In the setting of UGI bleed 01/2011 requiring blood transfusion  . Homelessness   . Dyspnea     Chronic, thought due to COPD. Echo 02/2010 with EF 30-35%, nuclear study negative, then repeat echo 03/2011 did not show systolic dysfxn, so unclear if truly HF  . Bronchitis     Past Surgical History  Procedure Date  . Tonsillectomy   . Vasectomy   . Appendectomy   . Tonsillectomy   .  Colonoscopy   . Esophagogastroduodenoscopy   . Esophagogastroduodenoscopy 02/03/2011    Procedure: ESOPHAGOGASTRODUODENOSCOPY (EGD);  Surgeon: Charna Elizabeth, MD;  Location: [facility];  Service: Endoscopy;  Laterality: N/A;  . Esophagogastroduodenoscopy 02/03/2011    Procedure: ESOPHAGOGASTRODUODENOSCOPY (EGD);  Surgeon: Charna Elizabeth, MD;  Location: [facility];  Service: Endoscopy;  Laterality: N/A;    Family History  Problem Relation Age of Onset  . Coronary artery disease Father     Starting in his 4's. Died of massive MI at age 67.  . Schizophrenia Brother   . Coronary artery disease Sister     MI at age 9  . Alzheimer's disease Mother     History  Substance Use Topics  . Smoking status: Current Some Day Smoker -- 1.0 packs/day for 60 years    Types: Cigarettes  . Smokeless tobacco: Never Used     Comment: Since age 50  . Alcohol Use: Yes     Comment: 174 ml of scotch weekly    Prior to Admission medications   Medication Sig Start Date End Date Taking? Authorizing Provider  acetaminophen-codeine (TYLENOL #3) 300-30 MG per tablet Take 1-2 tablets by mouth every 6 (six) hours as needed for pain. 01/28/12   Trevor Mace, PA-C    Current Facility-Administered Medications  Medication Dose Route Frequency Provider Last Rate Last Dose  . 0.9 % NaCl with KCl 20 mEq/ L  infusion  Intravenous Continuous Debby Crosley, MD 75 mL/hr at 01/28/12 2319 1,000 mL at 01/28/12 2319  . acetaminophen (TYLENOL) tablet 650 mg  650 mg Oral Q6H PRN Debby Crosley, MD       Or  . acetaminophen (TYLENOL) suppository 650 mg  650 mg Rectal Q6H PRN Debby Crosley, MD      . ipratropium (ATROVENT) nebulizer solution 0.5 mg  0.5 mg Nebulization Q4H PRN Debby Crosley, MD       And  . albuterol (PROVENTIL) (5 MG/ML) 0.5% nebulizer solution 2.5 mg  2.5 mg Nebulization Q4H PRN Debby Crosley, MD      . alum & mag hydroxide-simeth (MAALOX/MYLANTA) 200-200-20 MG/5ML suspension 30 mL  30 mL Oral Q6H PRN  Debby Crosley, MD      . HYDROcodone-acetaminophen (NORCO/VICODIN) 5-325 MG per tablet 1-2 tablet  1-2 tablet Oral Q4H PRN Gery Pray, MD   1 tablet at 01/29/12 0654  . nicotine (NICODERM CQ - dosed in mg/24 hours) patch 14 mg  14 mg Transdermal Daily Debby Crosley, MD      . ondansetron (ZOFRAN) tablet 4 mg  4 mg Oral Q6H PRN Debby Crosley, MD       Or  . ondansetron (ZOFRAN) injection 4 mg  4 mg Intravenous Q6H PRN Debby Crosley, MD      . pantoprazole (PROTONIX) injection 40 mg  40 mg Intravenous QHS Debby Crosley, MD   40 mg at 01/28/12 2300  . senna-docusate (Senokot-S) tablet 1 tablet  1 tablet Oral QHS PRN Gery Pray, MD        Allergies as of 01/28/2012 - Review Complete 01/28/2012  Allergen Reaction Noted  . Penicillins Itching 02/02/2011     Review of Systems:    Positive for 32 pound weight loss in a month. All systems reviewed and negative except where noted in HPI.   PHYSICAL EXAM: Vital signs in last 24 hours: Temp:  [97.8 F (36.6 C)-99 F (37.2 C)] 98.4 F (36.9 C) (12/18 0819) Pulse Rate:  [69-89] 72  (12/18 0819) Resp:  [14-23] 18  (12/18 0819) BP: (107-145)/(51-72) 131/72 mmHg (12/18 0819) SpO2:  [97 %-100 %] 97 % (12/18 0819) Weight:  [126 lb (57.153 kg)] 126 lb (57.153 kg) (12/17 2230) Last BM Date: 01/28/12 General:   Pleasant white male in NAD Head:  Normocephalic and atraumatic. Eyes:   No icterus.   Conjunctiva pale Ears:  Decreased auditory acuity. Neck:  Supple; no masses felt Lungs:  Respirations even and unlabored. Decreased breath sounds and crackles in LLL. Heart:  Regular rate and rhythm. Abdomen:  Soft, nondistended, nontender. Normal bowel sounds. No appreciable masses or hepatomegaly.  Rectal:  Not performed.  Msk:  Symmetrical without gross deformities.  Extremities:  Without edema. Neurologic:  Alert and  oriented x4;  grossly normal neurologically. Skin:  Intact without significant lesions or rashes. Cervical Nodes:  No  significant cervical adenopathy. Psych:  Alert and cooperative. Normal affect.  LAB RESULTS:  Basename 01/28/12 1805  WBC 10.6*  HGB 7.1*  HCT 24.1*  PLT 471*   BMET  Basename 01/28/12 1805  NA 131*  K 3.7  CL 99  CO2 22  GLUCOSE 100*  BUN 33*  CREATININE 1.18  CALCIUM 8.7    STUDIES: Dg Chest 2 View  01/28/2012  *RADIOLOGY REPORT*  Clinical Data: General pain and weakness; hx bronchitis and MI; smoker  CHEST - 2 VIEW  Comparison: 12/25/2011  Findings: Old rib and clavicular fractures noted.  The aortic arch atherosclerotic  calcification noted with tortuous descending thoracic aorta.  L2 compression fracture noted, with about 50% loss of height. Faint reticular interstitial accentuation noted, faint reticular interstitial accentuation noted bilaterally.  Borderline cardiomegaly.  No overt airspace edema.  Mild central airway thickening.  A round density projecting between the anterior portions of the left fifth and sixth ribs is noted.  This could represent a button or nipple shadow.  There are adjacent rib fractures but I would doubt that this is due to right rib fracture.  A true pulmonary nodule is difficult to exclude.  IMPRESSION:  1.  Mild central airway thickening and interstitial accentuation, without overt airspace opacity.  Appearance could be due to borderline interstitial edema or bronchitis/mild atypical pneumonia. 2. Left basilar pulmonary nodule, versus nipple marker or button. I see other buttons projecting over the chest, for example of the right lateral rib cage.  Repeat frontal radiography of the chest with nipple markers and with no buttons in the clothing is recommended. 3.  Chronic L2 compression fracture. 4.  Atherosclerotic and tortuous thoracic aorta.   Original Report Authenticated By: Gaylyn Rong, M.D.     PREVIOUS ENDOSCOPIES: EGD 2012, see HPI  IMPRESSION / PLAN: 1.  iron deficiency anemia, acute on chronic with heme positive stools. History of  severe erosive esophagitis in Dec 2012 . Recurrent erosive disease possible but no overt bleeding this time. Need to exclude colon neoplasm, especially given abnormal weight loss. For further evaluation patient will be scheduled for am colonoscopy which he is agreeable with.   2. ETOH and Tobacco abuse.   3. Homelessness  4. Pathologic weight loss, 32 pounds in a month      Thanks   LOS: 1 day   Willette Cluster  01/29/2012, 10:56 AM    I have taken a history, examined the patient and reviewed the chart. I agree with the Advanced Practitioner's note, impression and recommendations. Fe def anemia and heme + stool. R/O colorectal lesions. Colonoscopy tomorrow. The risks, benefits, and alternatives to colonoscopy with possible biopsy and possible polypectomy were discussed with the patient and they consent to proceed. He could have chronic blood loss from esophagitis. PPI long term given esophagitis noted on EGD in 12/12.   Meryl Dare MD [facility]

## 2012-01-29 NOTE — Progress Notes (Signed)
Patient for colonoscopy tomorrow.  Explained procedure to him and that he will have to drink a bowel prep.  Patient is agreeable to colonoscopy.  Will continue to monitor.

## 2012-01-29 NOTE — Progress Notes (Signed)
Patient with 3 beat run of Vtach.  Patient asymptomatic.  Dr. Rito Ehrlich aware.  Will continue to monitor.

## 2012-01-29 NOTE — Progress Notes (Signed)
Pt admitted from the ED. Pt alert and orientated x4. Pt watched safety video. Orientated to unit and equipment. Bed alarm on.

## 2012-01-29 NOTE — Evaluation (Signed)
Occupational Therapy Evaluation Patient Details Name: Dale Mcfarland MRN: [id number] DOB: 02-01-1930 Today's Date: 01/29/2012 Time: 9629-5284 OT Time Calculation (min): 20 min  OT Assessment / Plan / Recommendation Clinical Impression  this 78 year old man was admitted with weakness and is s/p PRBC transfusion.  He is scheduled for colonoscopy tomorrow. PMH is significant for chronic back pain, copd, and htn.   Pt is homeless and lives in a tent and independent at baseline, walking 5-7 miles a day and going to a soup kitchen for meals.  Pt presents with weakness and would benefit from skilled OT to increase independence with adls with supervision level goals in acute    OT Assessment  Patient needs continued OT Services    Follow Up Recommendations  SNF (depending on progress)    Barriers to Discharge      Equipment Recommendations       Recommendations for Other Services    Frequency  Min 2X/week    Precautions / Restrictions Precautions Precautions: Fall Restrictions Weight Bearing Restrictions: No   Pertinent Vitals/Pain 8/10 bil feet when weight bearing    ADL  Grooming: Simulated;Set up Where Assessed - Grooming: Unsupported sitting Upper Body Bathing: Simulated;Set up Where Assessed - Upper Body Bathing: Unsupported sitting Lower Body Bathing: Simulated;Minimal assistance (for sit to stand) Where Assessed - Lower Body Bathing: Supported sit to stand Upper Body Dressing: Simulated;Set up Where Assessed - Upper Body Dressing: Unsupported sitting Lower Body Dressing: Minimal assistance;Performed (for sit to stand) Where Assessed - Lower Body Dressing: Supported sit to stand Toilet Transfer: Performed;Minimal assistance Toilet Transfer Method: Sit to Barista: Raised toilet seat with arms (or 3-in-1 over toilet) Toileting - Clothing Manipulation and Hygiene: Simulated;Min guard Where Assessed - Toileting Clothing Manipulation and Hygiene: Sit to  stand from 3-in-1 or toilet Equipment Used: Rolling walker Transfers/Ambulation Related to ADLs: pt does not usually use ad.  Has 8/10 pain in bil feet and was flexed forward and unsteady when he stood without walker ADL Comments: pt wants a shower:  will leave note for MD to ask if OK    OT Diagnosis: Generalized weakness  OT Problem List: Decreased strength;Decreased activity tolerance;Impaired balance (sitting and/or standing);Decreased cognition;Pain OT Treatment Interventions: Self-care/ADL training;DME and/or AE instruction;Therapeutic activities;Patient/family education;Balance training   OT Goals Acute Rehab OT Goals OT Goal Formulation: With patient Time For Goal Achievement: 02/12/11 Potential to Achieve Goals: Good ADL Goals Pt Will Perform Grooming: with supervision;Standing at sink;Supported ADL Goal: Grooming - Progress: Goal set today Pt Will Perform Lower Body Bathing: with supervision;Sit to stand from bed ADL Goal: Lower Body Bathing - Progress: Goal set today Pt Will Perform Lower Body Dressing: with supervision;Sit to stand from bed ADL Goal: Lower Body Dressing - Progress: Goal set today Pt Will Transfer to Toilet: with supervision;Ambulation;3-in-1 ADL Goal: Toilet Transfer - Progress: Goal set today Pt Will Perform Toileting - Hygiene: with supervision;Sit to stand from 3-in-1/toilet ADL Goal: Toileting - Hygiene - Progress: Goal set today  Visit Information  Last OT Received On: 01/29/12 Assistance Needed: +1    Subjective Data  Subjective: There's a new benefit where I can get 14 years of free rent because I'm a vet Patient Stated Goal: find out what is going on and get out of here   Prior Functioning     Home Living Lives With: Alone Additional Comments: lives in a tent in a camp--only 1 or 2 others there Prior Function Level of Independence: Independent Communication Communication:  No difficulties (very talkative)         Vision/Perception      Cognition  Overall Cognitive Status: Impaired Area of Impairment: Memory Arousal/Alertness: Awake/alert Orientation Level: Appears intact for tasks assessed Behavior During Session: WFL for tasks performed Cognition - Other Comments: pt very talkative but repeated same thing over a few times    Extremity/Trunk Assessment Right Upper Extremity Assessment RUE ROM/Strength/Tone: Within functional levels (bil ues about 130 scaption) Left Upper Extremity Assessment LUE ROM/Strength/Tone: Within functional levels     Mobility Bed Mobility Bed Mobility: Supine to Sit;Sit to Supine Supine to Sit: 5: Supervision;HOB elevated Sit to Supine: 5: Supervision (pt forward crawled into bed) Transfers Transfers: Sit to Stand Sit to Stand: From bed;From chair/3-in-1;4: Min assist;With upper extremity assist     Shoulder Instructions     Exercise     Balance Balance Balance Assessed: Yes Static Standing Balance Static Standing - Balance Support: Bilateral upper extremity supported Static Standing - Level of Assistance: 5: Stand by assistance Static Standing - Comment/# of Minutes: 3 minutes   End of Session OT - End of Session Activity Tolerance: Patient tolerated treatment well Patient left: Other (comment) (tech came to transport pt to xray)  GO Functional Assessment Tool Used: clinical observation/judgment Functional Limitation: Self care Self Care Current Status [phone]): At least 20 percent but less than 40 percent impaired, limited or restricted Self Care Goal Status ([id number]): At least 1 percent but less than 20 percent impaired, limited or restricted   Rukaya Kleinschmidt 01/29/2012, 4:31 PM Marica Otter, OTR/L [phone] 01/29/2012

## 2012-01-29 NOTE — Progress Notes (Signed)
Patient's right great toe around toenail is necrotic.  Dr. Rito Ehrlich aware.  Will see patient.

## 2012-01-29 NOTE — Progress Notes (Signed)
[facility] PROGRESS NOTE  Dale Mcfarland ZOX:[id number] DOB: 1929-09-29 DOA: 01/28/2012  PCP: Georganna Skeans, MD  Brief HPI: This is 78 year old gentleman who is homeless, he states that he's been feeling weak. He states he normally walks 5-7 miles daily however, he is now able to walk 100 yards before he starts getting short of breath and weak. He says he is also lightheaded and dizzy. He is passing some dark blood in the stool. The patient denies any significant alcohol use or NSAID use. He does have a history of GI bleeding. Patient is a poor to fair historian. History provided by the patient.   Past medical history:  Past Medical History  Diagnosis Date  . COPD (chronic obstructive pulmonary disease)   . Chronic back pain   . Pneumonia     01/2011 - CAP vs aspiration pneuonmia  . Depression   . PONV (postoperative nausea and vomiting)   . Hyponatremia     Previously felt secondary to SIADH  . Hypertension   . Upper GI bleed     01/2011 with EGD showing severe candida esophagitis and duodenal bulb erosion  . Anemia     In the setting of UGI bleed 01/2011 requiring blood transfusion  . Homelessness   . Dyspnea     Chronic, thought due to COPD. Echo 02/2010 with EF 30-35%, nuclear study negative, then repeat echo 03/2011 did not show systolic dysfxn, so unclear if truly HF  . Bronchitis     Consultants: [facility]  Procedures: Colonoscopy planned for 12/19  Antibiotics: None  Subjective: Patient feels well. Says he has been noticing more shortness of breath with exertion than normal. Denies any abdominal pain. Mentions pain in right foot near the heel.  Objective: Vital Signs  Filed Vitals:   01/29/12 0511 01/29/12 0611 01/29/12 0712 01/29/12 0819  BP: 128/52 138/70 137/70 131/72  Pulse: 77 78 74 72  Temp: 98.1 F (36.7 C) 98 F (36.7 C) 98.6 F (37 C) 98.4 F (36.9 C)  TempSrc: Oral Oral Oral Oral  Resp: 16 16 18 18   Height:      Weight:      SpO2:    97%     Intake/Output Summary (Last 24 hours) at 01/29/12 1336 Last data filed at 01/29/12 0600  Gross per 24 hour  Intake   1451 ml  Output    200 ml  Net   1251 ml   Filed Weights   01/28/12 2230  Weight: 57.153 kg (126 lb)    Intake/Output from previous day: 12/17 0701 - 12/18 0700 In: 1451 [I.V.:500; Blood:651] Out: 200 [Urine:200]  General appearance: alert, cooperative, appears stated age, no distress and disheveled Head: Normocephalic, without obvious abnormality, atraumatic Resp: coarse breath sounds bilaterally with a few ctrackles at the bases Cardio: regular rate and rhythm, S1, S2 normal, no murmur, click, rub or gallop GI: soft, non-tender; bowel sounds normal; no masses,  no organomegaly Extremities: no deformity in right foot noted. good pulses Pulses: 2+ and symmetric Skin: Skin color, texture, turgor normal. No rashes or lesions Lymph nodes: Cervical, supraclavicular, and axillary nodes normal. Neurologic: No focal deficits  Lab Results:  Basic Metabolic Panel:  Lab 01/29/12 8119 01/28/12 1805  NA 135 131*  K 3.6 3.7  CL 104 99  CO2 23 22  GLUCOSE 86 100*  BUN 23 33*  CREATININE 1.00 1.18  CALCIUM 8.2* 8.7  MG -- --  PHOS -- --   Liver Function Tests: No results found  for this basename: AST:5,ALT:5,ALKPHOS:5,BILITOT:5,PROT:5,ALBUMIN:5 in the last 168 hours No results found for this basename: LIPASE:5,AMYLASE:5 in the last 168 hours No results found for this basename: AMMONIA:5 in the last 168 hours CBC:  Lab 01/29/12 1045 01/28/12 1805  WBC 7.8 10.6*  NEUTROABS -- --  HGB 8.4* 7.1*  HCT 26.6* 24.1*  MCV 72.7* 68.7*  PLT 340 471*   Cardiac Enzymes: No results found for this basename: CKTOTAL:5,CKMB:5,CKMBINDEX:5,TROPONINI:5 in the last 168 hours BNP (last 3 results)  Basename 04/11/11 0511 04/10/11 0415 03/29/11 0301  PROBNP 182.6 689.2* 373.2    Studies/Results: Dg Chest 2 View  01/28/2012  *RADIOLOGY REPORT*  Clinical Data: General  pain and weakness; hx bronchitis and MI; smoker  CHEST - 2 VIEW  Comparison: 12/25/2011  Findings: Old rib and clavicular fractures noted.  The aortic arch atherosclerotic calcification noted with tortuous descending thoracic aorta.  L2 compression fracture noted, with about 50% loss of height. Faint reticular interstitial accentuation noted, faint reticular interstitial accentuation noted bilaterally.  Borderline cardiomegaly.  No overt airspace edema.  Mild central airway thickening.  A round density projecting between the anterior portions of the left fifth and sixth ribs is noted.  This could represent a button or nipple shadow.  There are adjacent rib fractures but I would doubt that this is due to right rib fracture.  A true pulmonary nodule is difficult to exclude.  IMPRESSION:  1.  Mild central airway thickening and interstitial accentuation, without overt airspace opacity.  Appearance could be due to borderline interstitial edema or bronchitis/mild atypical pneumonia. 2. Left basilar pulmonary nodule, versus nipple marker or button. I see other buttons projecting over the chest, for example of the right lateral rib cage.  Repeat frontal radiography of the chest with nipple markers and with no buttons in the clothing is recommended. 3.  Chronic L2 compression fracture. 4.  Atherosclerotic and tortuous thoracic aorta.   Original Report Authenticated By: Gaylyn Rong, M.D.     Medications:  Scheduled:   . nicotine  14 mg Transdermal Daily  . pantoprazole (PROTONIX) IV  40 mg Intravenous QHS  . peg 3350 powder  1 kit Oral Once   Continuous:   . 0.9 % NaCl with KCl 20 mEq / L 1,000 mL (01/28/12 2319)   WUJ:[id number], acetaminophen, albuterol, alum & mag hydroxide-simeth, HYDROcodone-acetaminophen, ipratropium, ondansetron (ZOFRAN) IV, ondansetron, senna-docusate  Assessment/Plan:  Principal Problem:  *Microcytic anemia Active Problems:  COPD (chronic obstructive pulmonary disease)   Tobacco abuse  Heme positive stool    Microcytic Anemia  Concern is about chronic GI loss. Seen by GI. Plan is for colonoscopy in am. Continue PPI. Since he was symptomatic he was transfused. Repeat CBC later today.   Dyspnea on exertion Possibly from a combination of anemia and COPD. No pedal edema. CXR did not show any acute changes. Will repeat CXR with nipple markers.  COPD (chronic obstructive pulmonary disease) Counseled to stop smoking. May benefit from inhaled steroids but affordability will be an issue.  Tobacco abuse  Nicotine patch   Homelessness  CSW to see.  Code Status Full Code  DVT Prophylaxis SCD's  Family Communication: No family at bedside. Discussed with patient.  Disposition Plan: Unclear for now    LOS: 1 day   [facility]  [facility] Pager [phone] 01/29/2012, 1:36 PM  If 8PM-8AM, please contact night-coverage at [email or web address], password [facility]

## 2012-01-29 NOTE — Progress Notes (Signed)
Patient having occassional PVC's and a 1.09 sec pause on telemetry strip.  Patient asymptomatic.  Dr. Rito Ehrlich aware.  Will see patient.  Will continue to monitor.

## 2012-01-29 NOTE — Progress Notes (Signed)
INITIAL NUTRITION ASSESSMENT  DOCUMENTATION CODES Per approved criteria  -Not Applicable   INTERVENTION: - Diet advancement per MD - Recommend social work consult r/t pt being homeless - Will continue to monitor  NUTRITION DIAGNOSIS: Altered GI function r/t blood in stool as evidenced by H&P, pt statement.    Goal: Advance diet as tolerated to regular diet  Monitor:  Weights, labs, diet advancement, rectal bleed  Reason for Assessment: Nutrition risk   78 y.o. male  Admitting Dx: Microcytic anemia  ASSESSMENT: Pt lives in a homeless shelter. Pt states he gets his food from a [employer] soup kitchen and that they feed him well, 3 meals/day that are well balanced. Pt reports 32 pound unintended weight loss in the past month, however past weight trend show pt's weight only down 7 pounds in the past month. Pt admitted with passing blood in his stool. Performed limited nutrition focused physical exam.   Nutrition Focused Physical Exam: Subcutaneous Fat:  Orbital Region: WNL Upper Arm Region: mild/moderate wasting Thoracic and Lumbar Region: NA  Muscle:  Temple Region: WNL Clavicle Bone Region: WNL Clavicle and Acromion Bone Region: WNL Scapular Bone Region: WNL Dorsal Hand: WNL Patellar Region: NA Anterior Thigh Region: NA Posterior Calf Region: NA  Edema: None observed   Height: Ht Readings from Last 1 Encounters:  01/28/12 5' 7.5" (1.715 m)    Weight: Wt Readings from Last 1 Encounters:  01/28/12 126 lb (57.153 kg)    Ideal Body Weight: 148 lb  % Ideal Body Weight: 85  Wt Readings from Last 10 Encounters:  01/28/12 126 lb (57.153 kg)  12/25/11 133 lb (60.328 kg)  05/05/11 136 lb 4.8 oz (61.825 kg)  04/17/11 135 lb 11.2 oz (61.553 kg)  03/31/11 110 lb (49.896 kg)  03/29/11 132 lb 11.5 oz (60.2 kg)  03/05/11 137 lb 5.6 oz (62.3 kg)  02/02/11 130 lb (58.968 kg)  02/02/11 130 lb (58.968 kg)  02/02/11 130 lb (58.968 kg)    Usual Body Weight: 158 lb per  pt report  % Usual Body Weight: 80  BMI:  Body mass index is 19.44 kg/(m^2).  Estimated Nutritional Needs: Kcal: 1700-2000 Protein: 60-70g Fluid: 1.7-2L   Diet Order: Clear Liquid  EDUCATION NEEDS: -No education needs identified at this time   Intake/Output Summary (Last 24 hours) at 01/29/12 1405 Last data filed at 01/29/12 0600  Gross per 24 hour  Intake   1451 ml  Output    200 ml  Net   1251 ml    Last BM: 12/17  Labs:   Lab 01/29/12 1045 01/28/12 1805  NA 135 131*  K 3.6 3.7  CL 104 99  CO2 23 22  BUN 23 33*  CREATININE 1.00 1.18  CALCIUM 8.2* 8.7  MG -- --  PHOS -- --  GLUCOSE 86 100*    CBG (last 3)  No results found for this basename: GLUCAP:3 in the last 72 hours  Scheduled Meds:   . nicotine  14 mg Transdermal Daily  . pantoprazole (PROTONIX) IV  40 mg Intravenous QHS  . peg 3350 powder  1 kit Oral Once    Continuous Infusions:   . 0.9 % NaCl with KCl 20 mEq / L 1,000 mL (01/28/12 2319)    Past Medical History  Diagnosis Date  . COPD (chronic obstructive pulmonary disease)   . Chronic back pain   . Pneumonia     01/2011 - CAP vs aspiration pneuonmia  . Depression   . PONV (  postoperative nausea and vomiting)   . Hyponatremia     Previously felt secondary to SIADH  . Hypertension   . Upper GI bleed     01/2011 with EGD showing severe candida esophagitis and duodenal bulb erosion  . Anemia     In the setting of UGI bleed 01/2011 requiring blood transfusion  . Homelessness   . Dyspnea     Chronic, thought due to COPD. Echo 02/2010 with EF 30-35%, nuclear study negative, then repeat echo 03/2011 did not show systolic dysfxn, so unclear if truly HF  . Bronchitis     Past Surgical History  Procedure Date  . Tonsillectomy   . Vasectomy   . Appendectomy   . Tonsillectomy   . Colonoscopy   . Esophagogastroduodenoscopy   . Esophagogastroduodenoscopy 02/03/2011    Procedure: ESOPHAGOGASTRODUODENOSCOPY (EGD);  Surgeon: Charna Elizabeth,  MD;  Location: [facility];  Service: Endoscopy;  Laterality: N/A;  . Esophagogastroduodenoscopy 02/03/2011    Procedure: ESOPHAGOGASTRODUODENOSCOPY (EGD);  Surgeon: Charna Elizabeth, MD;  Location: [facility];  Service: Endoscopy;  Laterality: N/A;    Levon Hedger MS, RD, LDN [phone] Pager [phone] After Hours Pager

## 2012-01-30 ENCOUNTER — Encounter (HOSPITAL_COMMUNITY)
Admission: EM | Disposition: A | Discharge status: Skilled Nursing Facility | Payer: Self-pay | Source: Home / Self Care | Attending: Internal Medicine

## 2012-01-30 ENCOUNTER — Encounter (HOSPITAL_COMMUNITY): Payer: Self-pay

## 2012-01-30 DIAGNOSIS — R195 Other fecal abnormalities: Secondary | ICD-10-CM

## 2012-01-30 DIAGNOSIS — D126 Benign neoplasm of colon, unspecified: Secondary | ICD-10-CM | POA: Diagnosis present

## 2012-01-30 HISTORY — PX: COLONOSCOPY: SHX5424

## 2012-01-30 LAB — BASIC METABOLIC PANEL
CO2: 22 mEq/L (ref 19–32)
Calcium: 8.4 mg/dL (ref 8.4–10.5)
GFR calc Af Amer: 90 mL/min — ABNORMAL LOW (ref >90)
Sodium: 137 mEq/L (ref 135–145)

## 2012-01-30 LAB — CBC
MCH: 22.8 pg — ABNORMAL LOW (ref 26.0–34.0)
Platelets: 366 10*3/uL (ref 150–400)
RBC: 3.86 MIL/uL — ABNORMAL LOW (ref 4.22–5.81)
RDW: 19.1 % — ABNORMAL HIGH (ref 11.5–15.5)
WBC: 7.6 10*3/uL (ref 4.0–10.5)

## 2012-01-30 LAB — TYPE AND SCREEN
ABO/RH(D): O NEG
Antibody Screen: NEGATIVE
Unit division: 0

## 2012-01-30 LAB — MAGNESIUM: Magnesium: 1.8 mg/dL (ref 1.5–2.5)

## 2012-01-30 SURGERY — COLONOSCOPY
Anesthesia: Moderate Sedation

## 2012-01-30 MED ORDER — MIDAZOLAM HCL 5 MG/5ML IJ SOLN
INTRAMUSCULAR | Status: DC | PRN
  Administered 2012-01-30 (×2): 2 mg via INTRAVENOUS

## 2012-01-30 MED ORDER — VITAMIN B-1 100 MG PO TABS
100.0000 mg | ORAL_TABLET | Freq: Every day | ORAL | Status: DC
  Administered 2012-01-30 – 2012-01-31 (×2): 100 mg via ORAL
  Filled 2012-01-30 (×2): qty 1

## 2012-01-30 MED ORDER — FENTANYL CITRATE 0.05 MG/ML IJ SOLN
INTRAMUSCULAR | Status: DC | PRN
  Administered 2012-01-30 (×2): 25 ug via INTRAVENOUS

## 2012-01-30 MED ORDER — PANTOPRAZOLE SODIUM 40 MG PO TBEC
40.0000 mg | DELAYED_RELEASE_TABLET | Freq: Every day | ORAL | Status: DC
  Administered 2012-01-30: 40 mg via ORAL
  Filled 2012-01-30 (×2): qty 1

## 2012-01-30 MED ORDER — MIDAZOLAM HCL 10 MG/2ML IJ SOLN
INTRAMUSCULAR | Status: AC
  Filled 2012-01-30: qty 4

## 2012-01-30 MED ORDER — FENTANYL CITRATE 0.05 MG/ML IJ SOLN
INTRAMUSCULAR | Status: AC
  Filled 2012-01-30: qty 4

## 2012-01-30 MED ORDER — DIPHENHYDRAMINE HCL 50 MG/ML IJ SOLN
INTRAMUSCULAR | Status: AC
  Filled 2012-01-30: qty 1

## 2012-01-30 NOTE — Op Note (Signed)
[facility] [address] [place], [address]   COLONOSCOPY PROCEDURE REPORT PATIENT: Dale Mcfarland, Dale Mcfarland  MR#: [id number] BIRTHDATE: [birth year] , 82  yrs. old GENDER: Male ENDOSCOPIST: Meryl Dare, MD, [facility] REFERRED IO:[facility] PROCEDURE DATE:  01/30/2012 PROCEDURE:   Colonoscopy with snare polypectomy ASA CLASS:   Class III INDICATIONS:Iron Deficiency Anemia and heme-positive stool. MEDICATIONS: medications were titrated to patient response per physician's verbal order, Fentanyl 50 mcg IV, and Versed 4 mg IV DESCRIPTION OF PROCEDURE:   After the risks benefits and alternatives of the procedure were thoroughly explained, informed consent was obtained.  A digital rectal exam revealed no abnormalities of the rectum.   The Pentax Ped Colon [id number] endoscope was introduced through the anus and advanced to the cecum, which was identified by both the appendix and ileocecal valve. No adverse events experienced.   The quality of the prep was adequate, using MoviPrep  The instrument was then slowly withdrawn as the colon was fully examined.  COLON FINDINGS: A sessile polyp measuring 5 mm in size was found at the cecum.  A polypectomy was performed with a cold snare.  The resection was complete and the polyp tissue was completely retrieved.   A semi-pedunculated polyp measuring 12 mm in size was found at the ileocecal valve.  A polypectomy was performed using snare cautery.  The resection was complete and the polyp tissue was completely retrieved.   Two sessile polyps measuring 5 mm in size were found in the ascending colon.  A polypectomy was performed with a cold snare.  The resection was complete and the polyp tissue was completely retrieved.   Three sessile polyps measuring 6-7 mm in size were found in the transverse colon.  A polypectomy was performed with a cold snare.  The resection was complete and the polyp tissue was completely retrieved.   Three  pedunculated polyps measuring 10-13 mm in size were found in the descending colon and sigmoid colon.  A polypectomy was performed using snare cautery. The resection was complete and the polyp tissue was completely retrieved.   Two sessile polyps measuring 5-6 mm in size were found in the rectum and sigmoid colon.  A polypectomy was performed with a cold snare.  The resection was complete and the polyp tissue was completely retrieved.   Mild diverticulosis was noted in the sigmoid colon.  Retroflexed views revealed no abnormalities. The time to cecum=4 minutes 00 seconds.  Withdrawal time=22 minutes 00 seconds.  The scope was withdrawn and the procedure completed.  COMPLICATIONS: There were no complications.   ENDOSCOPIC IMPRESSION: 1.   Sessile polyp measuring 5 mm at the cecum; polypectomy performed with a cold snare 2.   Semi-pedunculated polyp measuring 12 mm at the ileocecal valve; polypectomy performed using snare cautery 3.   Two sessile polyps measuring 5 mm in the ascending colon; polypectomy performed with a cold snare 4.   Three sessile polyps measuring 6-7 mm in the transverse colon; polypectomy performed with a cold snare 5.   Three pedunculated polyps measuring 10-13 mm in the descending colon and sigmoid colon; polypectomy performed using snare cautery 6.   Two sessile polyps measuring 5-6 mm in the rectum and sigmoid colon; polypectomy performed with a cold snare 7.   Mild diverticulosis was noted in the sigmoid colon  RECOMMENDATIONS: 1.  Hold aspirin, aspirin products, and anti-inflammatory medication for 2 weeks. 2.  Await pathology results 3.  Repeat Colonoscopy in 2 years if health status appropriate for surveillance.  eSigned:  Meryl Dare, MD, [facility] 01/30/2012 1:27 PM      PATIENT NAME:  Dale Mcfarland, Dale Mcfarland MR#: [id number]

## 2012-01-30 NOTE — Progress Notes (Signed)
[facility] PROGRESS NOTE  Roselyn Reef NWG:[id number] DOB: 1929-04-09 DOA: 01/28/2012  PCP: Georganna Skeans, MD  Brief HPI: This is 78 year old gentleman who is homeless. He stated that he had been feeling weak. He stated he normally walks 5-7 miles daily however, he is now able to walk 100 yards before he starts getting short of breath and weak. He mentioned he was also lightheaded and dizzy. He was passing some dark blood in the stool. The patient denied any significant alcohol use or NSAID use. He does have a history of GI bleeding. Patient is a poor to fair historian. History provided by the patient.   Past medical history:  Past Medical History  Diagnosis Date  . COPD (chronic obstructive pulmonary disease)   . Chronic back pain   . Pneumonia     01/2011 - CAP vs aspiration pneuonmia  . Depression   . PONV (postoperative nausea and vomiting)   . Hyponatremia     Previously felt secondary to SIADH  . Hypertension   . Upper GI bleed     01/2011 with EGD showing severe candida esophagitis and duodenal bulb erosion  . Anemia     In the setting of UGI bleed 01/2011 requiring blood transfusion  . Homelessness   . Dyspnea     Chronic, thought due to COPD. Echo 02/2010 with EF 30-35%, nuclear study negative, then repeat echo 03/2011 did not show systolic dysfxn, so unclear if truly HF  . Bronchitis     Consultants: [facility]  Procedures: Colonoscopy planned for 12/19  Antibiotics: None  Subjective: Patient feels well. No new complaints. Denies any abdominal pain.   Objective: Vital Signs  Filed Vitals:   01/29/12 0819 01/29/12 1534 01/29/12 2112 01/30/12 0504  BP: 131/72 130/71 148/74 156/71  Pulse: 72 70 72 70  Temp: 98.4 F (36.9 C) 98.5 F (36.9 C) 98.3 F (36.8 C) 98.5 F (36.9 C)  TempSrc: Oral Oral Oral Oral  Resp: 18 18 18 18   Height:      Weight:      SpO2: 97% 98% 96% 96%    Intake/Output Summary (Last 24 hours) at 01/30/12 0819 Last data filed at  01/30/12 0600  Gross per 24 hour  Intake   3870 ml  Output    400 ml  Net   3470 ml   Filed Weights   01/28/12 2230  Weight: 57.153 kg (126 lb)    Intake/Output from previous day: 12/18 0701 - 12/19 0700 In: 3870 [P.O.:1200; I.V.:2670] Out: 400 [Urine:400]  General appearance: alert, cooperative, appears stated age, no distress and disheveled Head: Normocephalic, without obvious abnormality, atraumatic Resp: Improved air entry. No wheezing hear. Much clearer to auscultation.  Cardio: regular rate and rhythm, S1, S2 normal, no murmur, click, rub or gallop GI: soft, non-tender; bowel sounds normal; no masses, no organomegaly Extremities: no deformity in right foot noted. good pulses Pulses: 2+ and symmetric Neurologic: No focal deficits  Lab Results:  Basic Metabolic Panel:  Lab 01/30/12 5784 01/29/12 1045 01/28/12 1805  NA 137 135 131*  K 3.9 3.6 3.7  CL 109 104 99  CO2 22 23 22   GLUCOSE 87 86 100*  BUN 14 23 33*  CREATININE 0.92 1.00 1.18  CALCIUM 8.4 8.2* 8.7  MG -- -- --  PHOS -- -- --   CBC:  Lab 01/30/12 0532 01/29/12 1750 01/29/12 1045 01/28/12 1805  WBC 7.6 8.6 7.8 10.6*  NEUTROABS -- -- -- --  HGB 8.8* 8.9* 8.4* 7.1*  HCT 28.2* 28.4* 26.6* 24.1*  MCV 73.1* 73.4* 72.7* 68.7*  PLT 366 394 340 471*   BNP (last 3 results)  Basename 04/11/11 0511 04/10/11 0415 03/29/11 0301  PROBNP 182.6 689.2* 373.2    Studies/Results: Dg Chest 2 View  01/29/2012  *RADIOLOGY REPORT*  Clinical Data: Possible pulmonary nodules.  CHEST - 2 VIEW  Comparison: January 28, 2012.  Findings: Cardiomediastinal silhouette appears normal.  The nipple marker corresponds in location to the density noted on the prior radiograph consistent with overlying nipple shadow.  No acute pulmonary disease is noted.  IMPRESSION: Left lower lobe density noted on prior exam is consistent with overlying nipple shadow.  No acute abnormality is seen.   Original Report Authenticated By: Lupita Raider.,  M.D.    Dg Chest 2 View  01/28/2012  *RADIOLOGY REPORT*  Clinical Data: General pain and weakness; hx bronchitis and MI; smoker  CHEST - 2 VIEW  Comparison: 12/25/2011  Findings: Old rib and clavicular fractures noted.  The aortic arch atherosclerotic calcification noted with tortuous descending thoracic aorta.  L2 compression fracture noted, with about 50% loss of height. Faint reticular interstitial accentuation noted, faint reticular interstitial accentuation noted bilaterally.  Borderline cardiomegaly.  No overt airspace edema.  Mild central airway thickening.  A round density projecting between the anterior portions of the left fifth and sixth ribs is noted.  This could represent a button or nipple shadow.  There are adjacent rib fractures but I would doubt that this is due to right rib fracture.  A true pulmonary nodule is difficult to exclude.  IMPRESSION:  1.  Mild central airway thickening and interstitial accentuation, without overt airspace opacity.  Appearance could be due to borderline interstitial edema or bronchitis/mild atypical pneumonia. 2. Left basilar pulmonary nodule, versus nipple marker or button. I see other buttons projecting over the chest, for example of the right lateral rib cage.  Repeat frontal radiography of the chest with nipple markers and with no buttons in the clothing is recommended. 3.  Chronic L2 compression fracture. 4.  Atherosclerotic and tortuous thoracic aorta.   Original Report Authenticated By: Gaylyn Rong, M.D.     Medications:  Scheduled:    . nicotine  14 mg Transdermal Daily  . pantoprazole (PROTONIX) IV  40 mg Intravenous QHS   Continuous:    . 0.9 % NaCl with KCl 20 mEq / L 1,000 mL (01/29/12 2128)   ZOX:[id number], acetaminophen, albuterol, alum & mag hydroxide-simeth, HYDROcodone-acetaminophen, ipratropium, ondansetron (ZOFRAN) IV, ondansetron, senna-docusate  Assessment/Plan:  Principal Problem:  *Microcytic anemia Active  Problems:  COPD (chronic obstructive pulmonary disease)  Tobacco abuse  Heme positive stool  Dyspnea on exertion  Iron deficiency anemia, unspecified    Microcytic Anemia  Hgb is stable post transfusion. Concern is about chronic GI loss. Seen by GI. Plan is for colonoscopy today. Continue PPI.   Dyspnea on exertion Possibly from a combination of anemia and COPD. No pedal edema. Repeat CXR did not show nodule.   COPD (chronic obstructive pulmonary disease) Counseled to stop smoking. May benefit from inhaled steroids but affordability will be an issue.  Tobacco abuse  Nicotine patch   Homelessness  CSW to see. OT is recommending SNF currently, but may improve with further therapy in the hospital.  Code Status Full Code  DVT Prophylaxis SCD's  Family Communication: No family at bedside. Discussed with patient.  Disposition Plan: Unclear for now. Anticipate discharge in AM.    LOS: 2 days  [facility]  [facility] Pager [phone] 01/30/2012, 8:19 AM  If 8PM-8AM, please contact night-coverage at [email or web address], password [facility]

## 2012-01-30 NOTE — Progress Notes (Signed)
During shift pt continued to attempt to get up with out assist throughout the night. Pt educated on pt safety and instructed not to get out of bed without assistance. Pt still noncompliant. Bed alarm set.

## 2012-01-30 NOTE — Evaluation (Signed)
Physical Therapy Evaluation Patient Details Name: Dale Mcfarland MRN: [id number] DOB: [birth year] Today's Date: 01/30/2012 Time: 1478-2956 PT Time Calculation (min): 18 min  PT Assessment / Plan / Recommendation Clinical Impression  78 yo male admitted with microcytic anemia. Per chart, pt is homeless. On eval pt requires Min assist for mobility. Requires use of RW for stability-demonstrates general weakness and impaired dynamic balance. Pt was independent prior to admission. Recommend SNF for continued rehab unless mobility improves significantly. Pt would need to be supervision-modified independent level for mobility  before being able to safely d/c back into community.     PT Assessment  Patient needs continued PT services    Follow Up Recommendations  SNF    Does the patient have the potential to tolerate intense rehabilitation      Barriers to Discharge        Equipment Recommendations  Rolling walker with 5" wheels (depending on progress)    Recommendations for Other Services OT consult   Frequency Min 3X/week    Precautions / Restrictions Precautions Precautions: Fall Restrictions Weight Bearing Restrictions: No   Pertinent Vitals/Pain bil hips-sore      Mobility  Bed Mobility Bed Mobility: Supine to Sit;Sit to Supine Supine to Sit: 6: Modified independent (Device/Increase time);HOB flat Sit to Supine: 6: Modified independent (Device/Increase time);HOB flat Transfers Transfers: Sit to Stand;Stand to Sit Sit to Stand: 4: Min assist;From elevated surface;From bed Stand to Sit: 4: Min guard;To bed;To elevated surface Details for Transfer Assistance: VCs safety, technique hand placement. Assist to rise, stabilize, control descent.  Ambulation/Gait Ambulation/Gait Assistance: 4: Min assist;4: Min Government social research officer (Feet): 200 Feet Assistive device: Rolling walker Ambulation/Gait Assistance Details: Min assist for short distance of 20 feet without AD-pt  unsteady and reaching out for objects to hold onto. Improved stability with use of RW. VCs safety. Slow gait speed. Pt reports bil hip soreness with activity Gait Pattern: Step-through pattern;Decreased stride length    Shoulder Instructions     Exercises     PT Diagnosis: Difficulty walking;Abnormality of gait;Generalized weakness  PT Problem List: Decreased strength;Decreased activity tolerance;Decreased balance;Decreased mobility;Pain;Decreased knowledge of use of DME PT Treatment Interventions: DME instruction;Gait training;Functional mobility training;Therapeutic activities;Therapeutic exercise;Balance training;Patient/family education   PT Goals Acute Rehab PT Goals PT Goal Formulation: With patient Time For Goal Achievement: 02/13/12 Potential to Achieve Goals: Good Pt will go Sit to Stand: with supervision PT Goal: Sit to Stand - Progress: Goal set today Pt will go Stand to Sit: with supervision PT Goal: Stand to Sit - Progress: Goal set today Pt will Ambulate: >150 feet;with supervision;with least restrictive assistive device PT Goal: Ambulate - Progress: Goal set today Additional Goals Additional Goal #1: Pt will demonstrate good balance during functional mobility without LOB. PT Goal: Additional Goal #1 - Progress: Goal set today  Visit Information  Last PT Received On: 01/30/12 Assistance Needed: +1    Subjective Data  Subjective: "Im still a little weak" Patient Stated Goal: Stronger. Get back to walking.    Prior Functioning  Home Living Type of Home: Homeless Prior Function Level of Independence: Independent Communication Communication: No difficulties    Cognition  Overall Cognitive Status: Appears within functional limits for tasks assessed/performed Arousal/Alertness: Awake/alert Orientation Level: Appears intact for tasks assessed Behavior During Session: [facility] for tasks performed    Extremity/Trunk Assessment Right Lower Extremity Assessment RLE  ROM/Strength/Tone: Deficits RLE ROM/Strength/Tone Deficits: Strength at least 4/5 with functional activity Left Lower Extremity Assessment LLE ROM/Strength/Tone: Deficits LLE ROM/Strength/Tone Deficits:  Strength at least 4/5 with functional activity   Balance Balance Balance Assessed: Yes Static Standing Balance Static Standing - Balance Support: Right upper extremity supported;During functional activity Static Standing - Level of Assistance: 5: Stand by assistance  End of Session PT - End of Session Equipment Utilized During Treatment: Gait belt Activity Tolerance: Patient tolerated treatment well Patient left: in bed;with call bell/phone within reach;with bed alarm set  GP Functional Assessment Tool Used: clinical judgement Functional Limitation: Mobility: Walking and moving around Mobility: Walking and Moving Around Current Status (Z6109): At least 20 percent but less than 40 percent impaired, limited or restricted Mobility: Walking and Moving Around Goal Status [phone]): At least 1 percent but less than 20 percent impaired, limited or restricted   [name] [facility] 01/30/2012, 9:00 AM [phone]

## 2012-01-30 NOTE — Progress Notes (Deleted)
Patient discharged to home. Patient's wife at bedside.  Reviewed patient's discharge instructions with patient and family member.  No further questions at this time.  Prescriptions given to patient.  IV from left antecube removed.  Patient escorted to lobby via wheelchair.  Patient discharged.

## 2012-01-30 NOTE — Progress Notes (Signed)
Patient received from endoscopy.  Patient awake alert and oriented x3.  Patient does not complain of any pain at this time.  IV fluids infusing as ordered.  Will continue to monitor.

## 2012-01-30 NOTE — Interval H&P Note (Signed)
History and Physical Interval Note:  01/30/2012 12:14 PM  Dale Mcfarland  has presented today for surgery, with the diagnosis of anemia, hemoccult positive stool  The various methods of treatment have been discussed with the patient and family. After consideration of risks, benefits and other options for treatment, the patient has consented to  Procedure(s) (LRB) with comments: COLONOSCOPY (N/A) as a surgical intervention .  The patient's history has been reviewed, patient examined, no change in status, stable for surgery.  I have reviewed the patient's chart and labs.  Questions were answered to the patient's satisfaction.     Venita Lick. Russella Dar MD Clementeen Graham

## 2012-01-30 NOTE — Progress Notes (Signed)
Patient's blood pressure 171/65 post colonoscopy.  Patient asymptomatic.  Dr. Rito Ehrlich paged with this information.
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
## 2012-01-31 ENCOUNTER — Encounter: Payer: Self-pay | Admitting: Gastroenterology

## 2012-01-31 ENCOUNTER — Encounter (HOSPITAL_COMMUNITY): Payer: Self-pay | Admitting: Gastroenterology

## 2012-01-31 DIAGNOSIS — D126 Benign neoplasm of colon, unspecified: Secondary | ICD-10-CM

## 2012-01-31 LAB — CBC
MCH: 22.8 pg — ABNORMAL LOW (ref 26.0–34.0)
Platelets: 359 10*3/uL (ref 150–400)
RBC: 3.64 MIL/uL — ABNORMAL LOW (ref 4.22–5.81)
WBC: 8.7 10*3/uL (ref 4.0–10.5)

## 2012-01-31 LAB — BASIC METABOLIC PANEL
CO2: 21 mEq/L (ref 19–32)
Calcium: 8.4 mg/dL (ref 8.4–10.5)
Sodium: 135 mEq/L (ref 135–145)

## 2012-01-31 MED ORDER — OMEPRAZOLE 20 MG PO CPDR: 40.0000 mg | DELAYED_RELEASE_CAPSULE | Freq: Every day | ORAL | Status: DC

## 2012-01-31 MED ORDER — NICOTINE 14 MG/24HR TD PT24: 1.0000 | MEDICATED_PATCH | Freq: Every day | TRANSDERMAL | Status: DC

## 2012-01-31 MED ORDER — THIAMINE HCL 100 MG PO TABS: 100.0000 mg | ORAL_TABLET | Freq: Every day | ORAL | Status: DC

## 2012-01-31 MED ORDER — POLYSACCHARIDE IRON COMPLEX 150 MG PO CAPS: 150.0000 mg | ORAL_CAPSULE | Freq: Two times a day (BID) | ORAL | Status: DC

## 2012-01-31 MED ORDER — IPRATROPIUM-ALBUTEROL 18-103 MCG/ACT IN AERO: 2.0000 | INHALATION_SPRAY | Freq: Four times a day (QID) | RESPIRATORY_TRACT | Status: DC | PRN

## 2012-01-31 NOTE — Progress Notes (Signed)
[facility] Progress Note  SUBJECTIVE: no complaints.  OBJECTIVE:  Vital signs in last 24 hours: Temp:  [97.8 F (36.6 C)-98.5 F (36.9 C)] 98.5 F (36.9 C) (12/20 0611) Pulse Rate:  [71-88] 74  (12/20 0611) Resp:  [8-82] 18  (12/20 0611) BP: (101-171)/(42-78) 141/61 mmHg (12/20 0611) SpO2:  [85 %-100 %] 97 % (12/20 0611) Last BM Date: 01/30/12 General:    Pleasant white male in NAD Abdomen:  Soft, nontender and nondistended. Normal bowel sounds. Neurologic:  Alert and oriented,  grossly normal neurologically. Psych:  Cooperative. Normal mood and affect.   Lab Results:  Basename 01/31/12 0500 01/30/12 0532 01/29/12 1750  WBC 8.7 7.6 8.6  HGB 8.3* 8.8* 8.9*  HCT 26.6* 28.2* 28.4*  PLT 359 366 394   BMET  Basename 01/31/12 0500 01/30/12 0532 01/29/12 1045  NA 135 137 135  K 4.1 3.9 3.6  CL 106 109 104  CO2 21 22 23   GLUCOSE 84 87 86  BUN 9 14 23   CREATININE 0.87 0.92 1.00  CALCIUM 8.4 8.4 8.2*   Studies/Results: Dg Chest 2 View  01/29/2012  *RADIOLOGY REPORT*  Clinical Data: Possible pulmonary nodules.  CHEST - 2 VIEW  Comparison: January 28, 2012.  Findings: Cardiomediastinal silhouette appears normal.  The nipple marker corresponds in location to the density noted on the prior radiograph consistent with overlying nipple shadow.  No acute pulmonary disease is noted.  IMPRESSION: Left lower lobe density noted on prior exam is consistent with overlying nipple shadow.  No acute abnormality is seen.   Original Report Authenticated By: Lupita Raider.,  M.D.     ASSESSMENT / PLAN:  Microcytic anemia / heme positive stools. Patient had multiple colon polyps removed yesterday but no active bleeding was found in colon. Anemia may be multifactorial (polyps, untreated severe erosive esophagitis, ETOH, poor nutrition). Hold all NSAIDS for 2 weeks. He will need surveillance colonoscopy in two years (if health status permits). I explained this to patient. Recommend  daily PPI for history of severe erosive esophagitis and fe replacement.    LOS: 3 days   Willette Cluster  01/31/2012, 9:08 AM   I have taken an interval history, reviewed the chart and examined the patient. I agree with the Advanced Practitioner's note, impression and recommendations. GI signing off.  Venita Lick. Russella Dar MD Clementeen Graham

## 2012-01-31 NOTE — Progress Notes (Signed)
Discharge instructions accompanied pt, left the unit in stable condition. 

## 2012-01-31 NOTE — Discharge Summary (Signed)
[facility]  Physician Discharge Summary   Patient ID: Dale Mcfarland MRN: [id number] DOB/AGE: [birth year] 76 y.o.  Admit date: 01/28/2012 Discharge date: 01/31/2012  PCP: Georganna Skeans, MD  DISCHARGE DIAGNOSES:  Principal Problem:  *Iron deficiency anemia, unspecified Active Problems:  COPD (chronic obstructive pulmonary disease)  Tobacco abuse  Heme positive stool  Dyspnea on exertion  Benign neoplasm of colon   RECOMMENDATIONS FOR OUTPATIENT FOLLOW UP: 1. Will need repeat colonoscopy in 2 years.   DISCHARGE CONDITION: fair  Diet recommendation: Regular  Filed Weights   01/28/12 2230  Weight: 57.153 kg (126 lb)    INITIAL HISTORY: This is 78 year old gentleman who is homeless. He stated that he had been feeling weak. He stated he normally walks 5-7 miles daily however, he is now able to walk 100 yards before he starts getting short of breath and weak. He mentioned he was also lightheaded and dizzy. He was passing some dark blood in the stool. The patient denied any significant alcohol use or NSAID use. He does have a history of GI bleeding. Patient is a poor to fair historian. History provided by the patient.  Consultations:  [facility] (Dr. Russella Dar)  Procedures:  Colonoscopy on 01/30/12 ENDOSCOPIC IMPRESSION:  1. Sessile polyp measuring 5 mm at the cecum; polypectomy performed with a cold snare  2. Semi-pedunculated polyp measuring 12 mm at the ileocecal valve; polypectomy performed using snare cautery  3. Two sessile polyps measuring 5 mm in the ascending colon; polypectomy performed with a cold snare  4. Three sessile polyps measuring 6-7 mm in the transverse colon; polypectomy performed with a cold snare  5. Three pedunculated polyps measuring 10-13 mm in the descending colon and sigmoid colon; polypectomy performed using snare cautery  6. Two sessile polyps measuring 5-6 mm in the rectum and sigmoid colon; polypectomy performed with a cold snare  7. Mild  diverticulosis was noted in the sigmoid colon  RECOMMENDATIONS:  1. Hold aspirin, aspirin products, and anti-inflammatory medication for 2 weeks.  2. Await pathology results  3. Repeat Colonoscopy in 2 years if health status appropriate for surveillance.   HOSPITAL COURSE:   Iron Deficiency Anemia  Patient was found to have microcytic anemia. His initial hemoglobin was 7.1. His MCV 68.7. He had a previous hospitalization in March for upper GI bleeding. He was found to have esophagitis and Candida. He was given Diflucan and treated with the proton pump inhibitors. This time around he had heme positive, stools. Patient underwent a colonoscopy. The results of which are mentioned above. Multiple polyps were seen. Pathology on these polyps are pending. Patient was transfused 2 units of blood. His hemoglobin is greater than 8 and has been stable. No active bleeding has been noted. For his iron deficiency he'll be started on iron supplementation. PPI should be continued for history of esophagitis   Dyspnea on exertion  Possibly from a combination of anemia and COPD. No pedal edema. Repeat CXR did not show nodule. I think tobacco smoking is causing most of his respiratory issues. I have counseled to discontinue smoking. Combivent prescribed.  COPD (chronic obstructive pulmonary disease)  Counseled to stop smoking. Combivent will be prescribed. Inhaled steroids will be of benefit. However, since his compliance is questionable we will hold off on this for now.  Tobacco abuse  Nicotine patch   Homelessness  Patient was seen by physical and outpatient therapy. They did recommend SNF. Social worker saw him and he initially refused skilled nursing facility. However, his daughter came in  and we had a discussion and he is agreeable with SNF at this time. Social worker is working on it. Patient will be going to skilled nursing facility   Overall, patient is stable. His hemoglobin remained stable. He is stable  after his procedure yesterday. He is okay for discharge at this time.  PERTINENT LABS:  The results of significant diagnostics from this hospitalization (including imaging, microbiology, ancillary and laboratory) are listed below for reference.    Labs: Basic Metabolic Panel:  Lab 01/31/12 7829 01/30/12 0535 01/30/12 0532 01/29/12 1045 01/28/12 1805  NA 135 -- 137 135 131*  K 4.1 -- 3.9 3.6 3.7  CL 106 -- 109 104 99  CO2 21 -- 22 23 22   GLUCOSE 84 -- 87 86 100*  BUN 9 -- 14 23 33*  CREATININE 0.87 -- 0.92 1.00 1.18  CALCIUM 8.4 -- 8.4 8.2* 8.7  MG -- 1.8 -- -- --  PHOS -- -- -- -- --   CBC:  Lab 01/31/12 0500 01/30/12 0532 01/29/12 1750 01/29/12 1045 01/28/12 1805  WBC 8.7 7.6 8.6 7.8 10.6*  NEUTROABS -- -- -- -- --  HGB 8.3* 8.8* 8.9* 8.4* 7.1*  HCT 26.6* 28.2* 28.4* 26.6* 24.1*  MCV 73.1* 73.1* 73.4* 72.7* 68.7*  PLT 359 366 394 340 471*    IMAGING STUDIES Dg Chest 2 View  01/29/2012  *RADIOLOGY REPORT*  Clinical Data: Possible pulmonary nodules.  CHEST - 2 VIEW  Comparison: January 28, 2012.  Findings: Cardiomediastinal silhouette appears normal.  The nipple marker corresponds in location to the density noted on the prior radiograph consistent with overlying nipple shadow.  No acute pulmonary disease is noted.  IMPRESSION: Left lower lobe density noted on prior exam is consistent with overlying nipple shadow.  No acute abnormality is seen.   Original Report Authenticated By: Lupita Raider.,  M.D.    Dg Chest 2 View  01/28/2012  *RADIOLOGY REPORT*  Clinical Data: General pain and weakness; hx bronchitis and MI; smoker  CHEST - 2 VIEW  Comparison: 12/25/2011  Findings: Old rib and clavicular fractures noted.  The aortic arch atherosclerotic calcification noted with tortuous descending thoracic aorta.  L2 compression fracture noted, with about 50% loss of height. Faint reticular interstitial accentuation noted, faint reticular interstitial accentuation noted bilaterally.   Borderline cardiomegaly.  No overt airspace edema.  Mild central airway thickening.  A round density projecting between the anterior portions of the left fifth and sixth ribs is noted.  This could represent a button or nipple shadow.  There are adjacent rib fractures but I would doubt that this is due to right rib fracture.  A true pulmonary nodule is difficult to exclude.  IMPRESSION:  1.  Mild central airway thickening and interstitial accentuation, without overt airspace opacity.  Appearance could be due to borderline interstitial edema or bronchitis/mild atypical pneumonia. 2. Left basilar pulmonary nodule, versus nipple marker or button. I see other buttons projecting over the chest, for example of the right lateral rib cage.  Repeat frontal radiography of the chest with nipple markers and with no buttons in the clothing is recommended. 3.  Chronic L2 compression fracture. 4.  Atherosclerotic and tortuous thoracic aorta.   Original Report Authenticated By: Gaylyn Rong, M.D.     DISCHARGE EXAMINATION: Filed Vitals:   01/30/12 1432 01/30/12 1704 01/30/12 2134 01/31/12 0611  BP: 171/65 130/68 129/64 141/61  Pulse: 72 88 79 74  Temp: 97.8 F (36.6 C) 98.3 F (36.8 C) 98.4 F (36.9  C) 98.5 F (36.9 C)  TempSrc: Oral Oral Oral Oral  Resp: 18 18 18 18   Height:      Weight:      SpO2: 97% 97% 96% 97%   General appearance: alert, cooperative, appears stated age and no distress Resp: Few wheezes bilaterally. No crackles. Cardio: regular rate and rhythm, S1, S2 normal, no murmur, click, rub or gallop GI: soft, non-tender; bowel sounds normal; no masses,  no organomegaly Extremities: extremities normal, atraumatic, no cyanosis or edema Neurologic: And oriented x3. No focal neurological deficit present  DISPOSITION: SNF  Discharge Orders    Future Orders Please Complete By Expires   Diet general      Increase activity slowly      Discharge instructions      Comments:   No aspirin,  Motrin or other NSAIDS. Stop smoking.     Current Discharge Medication List    START taking these medications   Details  acetaminophen-codeine (TYLENOL #3) 300-30 MG per tablet Take 1-2 tablets by mouth every 6 (six) hours as needed for pain. Qty: 15 tablet, Refills: 0    albuterol-ipratropium (COMBIVENT) 18-103 MCG/ACT inhaler Inhale 2 puffs into the lungs every 6 (six) hours as needed for wheezing. Qty: 1 Inhaler, Refills: 1    iron polysaccharides (NU-IRON) 150 MG capsule Take 1 capsule (150 mg total) by mouth 2 (two) times daily. Qty: 60 capsule, Refills: 1    nicotine (NICODERM CQ - DOSED IN MG/24 HOURS) 14 mg/24hr patch Place 1 patch onto the skin daily. Qty: 28 patch, Refills: 0    omeprazole (PRILOSEC) 20 MG capsule Take 2 capsules (40 mg total) by mouth daily. Qty: 30 capsule, Refills: 1    thiamine 100 MG tablet Take 1 tablet (100 mg total) by mouth daily. Qty: 30 tablet, Refills: 0       Follow-up Information    Schedule an appointment as soon as possible for a visit with Georganna Skeans, MD.   Contact information:   [address] [place] [address] [phone]       Call Judie Petit T. Russella Dar, MD,FACG. (Call next week, to get results of the pathology on your polyps)    Contact information:   [address] [place] [address] [phone]          TOTAL DISCHARGE TIME: 35 mins  [facility]  [facility] Pager [phone]  01/31/2012, 10:57 AM

## 2012-01-31 NOTE — Progress Notes (Signed)
Clinical Social Work Department BRIEF PSYCHOSOCIAL ASSESSMENT 01/31/2012  Patient:  Dale Mcfarland, Dale Mcfarland     Account Number:  [id number]     Admit date:  01/28/2012  Clinical Social Worker:  Hattie Perch  Date/Time:  01/31/2012 12:00 M  Referred by:  Physician  Date Referred:  01/31/2012 Referred for  SNF Placement   Other Referral:   Interview type:  Patient Other interview type:   daughter    PSYCHOSOCIAL DATA Living Status:  ALONE Admitted from facility:   Level of care:   Primary support name:  Dale Mcfarland Primary support relationship to patient:  CHILD, ADULT Degree of support available:   good    CURRENT CONCERNS Current Concerns  Post-Acute Placement  Post-Acute Placement   Other Concerns:    SOCIAL WORK ASSESSMENT / PLAN CSW met with patient. patient is alert and oriented X3. patient agreeable to snf placement. patient faxed out and chooses [facility].   Assessment/plan status:   Other assessment/ plan:   Information/referral to community resources:    PATIENT'S/FAMILY'S RESPONSE TO PLAN OF CARE: agreeable for discharge to [facility].

## 2012-01-31 NOTE — Progress Notes (Signed)
Met with Pt's daughter and CSW Becky Sax.  Per CSW [place], Pt agreeable to SNF.  [place] to begin SNF search.  CSW provided Pt's daughter with DSS information, as Pt stating that he no longer has [facility].  Met briefly with Pt to discuss d/c plans.  Pt agreeable to SNF and agreeable to daughter's help.  CSW thanked Pt and Pt's daughter for their time.  CSW to continue to follow.  Providence Crosby, LCSWA Clinical Social Work [phone]

## 2012-01-31 NOTE — Progress Notes (Signed)
Clinical Social Work Department CLINICAL SOCIAL WORK PLACEMENT NOTE 01/31/2012  Patient:  Dale Mcfarland, Dale Mcfarland  Account Number:  [id number] Admit date:  01/28/2012  Clinical Social Worker:  Becky Sax, LCSW  Date/time:  01/31/2012 12:00 M  Clinical Social Work is seeking post-discharge placement for this patient at the following level of care:   SKILLED NURSING   (*CSW will update this form in Epic as items are completed)   01/31/2012  Patient/family provided with [facility] Department of Clinical Social Work's list of facilities offering this level of care within the geographic area requested by the patient (or if unable, by the patient's family).  01/31/2012  Patient/family informed of their freedom to choose among providers that offer the needed level of care, that participate in Medicare, Medicaid or managed care program needed by the patient, have an available bed and are willing to accept the patient.  01/31/2012  Patient/family informed of [facility]' ownership interest in [facility], as well as of the fact that they are under no obligation to receive care at this facility.  PASARR submitted to EDS on 01/31/2012 PASARR number received from [employer] on 01/31/2012  FL2 transmitted to all facilities in geographic area requested by pt/family on  01/31/2012 FL2 transmitted to all facilities within larger geographic area on 01/31/2012  Patient informed that his/her managed care company has contracts with or will negotiate with  certain facilities, including the following:   t     Patient/family informed of bed offers received:  01/31/2012 Patient chooses bed at [facility] [place] Physician recommends and patient chooses bed at    Patient to be transferred to [facility] [place] on  01/31/2012 Patient to be transferred to facility by ptar  The following physician request were entered in Epic:   Additional Comments:

## 2012-01-31 NOTE — Progress Notes (Signed)
Physical Therapy Treatment Patient Details Name: Dale Mcfarland MRN: [id number] DOB: 13-Feb-1929 Today's Date: 01/31/2012 Time: 4098-1191 PT Time Calculation (min): 23 min  PT Assessment / Plan / Recommendation Comments on Treatment Session  Tx session focused on pt's ability to mobilize without assistive device. Pt continues to demonstrate risk for falls-unable to maintain balance without external assist of threrapist or assistive device. Continue to recommend ST rehab at SNF to maximize independence and improve gait and balance.     Follow Up Recommendations  SNF     Does the patient have the potential to tolerate intense rehabilitation     Barriers to Discharge        Equipment Recommendations  Rolling walker with 5" wheels    Recommendations for Other Services OT consult  Frequency Min 3X/week   Plan Discharge plan remains appropriate    Precautions / Restrictions Precautions Precautions: Fall Restrictions Weight Bearing Restrictions: No   Pertinent Vitals/Pain bil feet/legs-unrated    Mobility  Bed Mobility Bed Mobility: Supine to Sit;Sit to Supine Supine to Sit: 6: Modified independent (Device/Increase time) Sit to Supine: 6: Modified independent (Device/Increase time) Transfers Transfers: Sit to Stand;Stand to Sit Sit to Stand: 4: Min assist;From bed Stand to Sit: 4: Min guard;To bed Details for Transfer Assistance: VCs safety, technique, hand placement. Assist to rise, stabilize, control descent Ambulation/Gait Ambulation/Gait Assistance: 4: Min assist Ambulation Distance (Feet): 200 Feet Assistive device: None Ambulation/Gait Assistance Details: VCs safety. Assist to stabilize throughout ambulation. Intermittent stumbling, scissoring requiring external assist to prevent fall (2-3 instances of LOB). Slow gait speed. Pt continues to demonstrate risk for falls.  Gait Pattern: Step-through pattern;Decreased stride length;Scissoring;Shuffle    Exercises General  Exercises - Lower Extremity-holding onto walker Hip Flexion/Marching: AROM;Both;10 reps;Standing Heel Raises:  (attempted but c/o foot pain-deferred) Mini-Sqauts: AROM;Both;5 reps;Standing   PT Diagnosis:    PT Problem List:   PT Treatment Interventions:     PT Goals Acute Rehab PT Goals Pt will go Sit to Stand: with supervision PT Goal: Sit to Stand - Progress: Progressing toward goal Pt will go Stand to Sit: with supervision PT Goal: Stand to Sit - Progress: Progressing toward goal Pt will Ambulate: >150 feet;with supervision;with least restrictive assistive device PT Goal: Ambulate - Progress: Progressing toward goal Additional Goals PT Goal: Additional Goal #1 - Progress: Progressing toward goal  Visit Information  Last PT Received On: 01/31/12 Assistance Needed: +1    Subjective Data  Subjective: "My legs and feet hurt"   Cognition  Overall Cognitive Status: Appears within functional limits for tasks assessed/performed Arousal/Alertness: Awake/alert Orientation Level: Appears intact for tasks assessed Behavior During Session: [facility] for tasks performed    Balance  Balance Balance Assessed: Yes Static Standing Balance Static Standing - Balance Support: No upper extremity supported Static Standing - Level of Assistance: 5: Stand by assistance Dynamic Standing Balance Dynamic Standing - Balance Support: During functional activity;No upper extremity supported Dynamic Standing - Level of Assistance: 4: Min assist  End of Session PT - End of Session Equipment Utilized During Treatment: Gait belt Activity Tolerance: Patient limited by pain Patient left: in bed;with call bell/phone within reach   GP     [name] [facility] 01/31/2012, 11:51 AM [phone].

## 2012-02-17 ENCOUNTER — Telehealth: Payer: Self-pay | Admitting: Gastroenterology

## 2012-02-17 NOTE — Telephone Encounter (Signed)
Patient is in a rehab center and his daughter is working on becoming his POA.  I have reviewed the results and mailed her a copy of the letter.

## 2012-05-25 ENCOUNTER — Ambulatory Visit: Payer: Self-pay | Admitting: Nurse Practitioner

## 2012-11-12 ENCOUNTER — Emergency Department (HOSPITAL_COMMUNITY)
Admission: EM | Admit: 2012-11-12 | Discharge: 2012-11-12 | Disposition: A | Discharge status: Home / Self Care | Payer: Medicare Other | Attending: Emergency Medicine | Admitting: Emergency Medicine

## 2012-11-12 ENCOUNTER — Encounter (HOSPITAL_COMMUNITY): Payer: Self-pay | Admitting: Emergency Medicine

## 2012-11-12 ENCOUNTER — Emergency Department (HOSPITAL_COMMUNITY): Payer: Medicare Other

## 2012-11-12 DIAGNOSIS — J4489 Other specified chronic obstructive pulmonary disease: Secondary | ICD-10-CM | POA: Insufficient documentation

## 2012-11-12 DIAGNOSIS — Z79899 Other long term (current) drug therapy: Secondary | ICD-10-CM | POA: Insufficient documentation

## 2012-11-12 DIAGNOSIS — I10 Essential (primary) hypertension: Secondary | ICD-10-CM | POA: Insufficient documentation

## 2012-11-12 DIAGNOSIS — Y939 Activity, unspecified: Secondary | ICD-10-CM | POA: Insufficient documentation

## 2012-11-12 DIAGNOSIS — S51009A Unspecified open wound of unspecified elbow, initial encounter: Secondary | ICD-10-CM | POA: Insufficient documentation

## 2012-11-12 DIAGNOSIS — Z8701 Personal history of pneumonia (recurrent): Secondary | ICD-10-CM | POA: Insufficient documentation

## 2012-11-12 DIAGNOSIS — X58XXXA Exposure to other specified factors, initial encounter: Secondary | ICD-10-CM | POA: Insufficient documentation

## 2012-11-12 DIAGNOSIS — Z5900 Homelessness unspecified: Secondary | ICD-10-CM | POA: Insufficient documentation

## 2012-11-12 DIAGNOSIS — S0003XA Contusion of scalp, initial encounter: Secondary | ICD-10-CM | POA: Insufficient documentation

## 2012-11-12 DIAGNOSIS — Z8659 Personal history of other mental and behavioral disorders: Secondary | ICD-10-CM | POA: Insufficient documentation

## 2012-11-12 DIAGNOSIS — Z862 Personal history of diseases of the blood and blood-forming organs and certain disorders involving the immune mechanism: Secondary | ICD-10-CM | POA: Insufficient documentation

## 2012-11-12 DIAGNOSIS — Z8639 Personal history of other endocrine, nutritional and metabolic disease: Secondary | ICD-10-CM | POA: Insufficient documentation

## 2012-11-12 DIAGNOSIS — S42401A Unspecified fracture of lower end of right humerus, initial encounter for closed fracture: Secondary | ICD-10-CM

## 2012-11-12 DIAGNOSIS — Z8709 Personal history of other diseases of the respiratory system: Secondary | ICD-10-CM | POA: Insufficient documentation

## 2012-11-12 DIAGNOSIS — Y9289 Other specified places as the place of occurrence of the external cause: Secondary | ICD-10-CM | POA: Insufficient documentation

## 2012-11-12 DIAGNOSIS — S42409A Unspecified fracture of lower end of unspecified humerus, initial encounter for closed fracture: Secondary | ICD-10-CM | POA: Insufficient documentation

## 2012-11-12 DIAGNOSIS — Z8739 Personal history of other diseases of the musculoskeletal system and connective tissue: Secondary | ICD-10-CM | POA: Insufficient documentation

## 2012-11-12 DIAGNOSIS — T148XXA Other injury of unspecified body region, initial encounter: Secondary | ICD-10-CM

## 2012-11-12 DIAGNOSIS — Z8719 Personal history of other diseases of the digestive system: Secondary | ICD-10-CM | POA: Insufficient documentation

## 2012-11-12 DIAGNOSIS — J449 Chronic obstructive pulmonary disease, unspecified: Secondary | ICD-10-CM | POA: Insufficient documentation

## 2012-11-12 MED ORDER — HYDROMORPHONE HCL PF 1 MG/ML IJ SOLN
1.0000 mg | Freq: Once | INTRAMUSCULAR | Status: AC
  Administered 2012-11-12: 1 mg via INTRAVENOUS
  Filled 2012-11-12: qty 1

## 2012-11-12 MED ORDER — TETANUS-DIPHTH-ACELL PERTUSSIS 5-2.5-18.5 LF-MCG/0.5 IM SUSP
0.5000 mL | Freq: Once | INTRAMUSCULAR | Status: AC
  Administered 2012-11-12: 0.5 mL via INTRAMUSCULAR
  Filled 2012-11-12: qty 0.5

## 2012-11-12 MED ORDER — HYDROCODONE-ACETAMINOPHEN 5-325 MG PO TABS: 1.0000 | ORAL_TABLET | ORAL | Status: AC | PRN

## 2012-11-12 MED ORDER — SODIUM CHLORIDE 0.9 % IV BOLUS (SEPSIS)
1000.0000 mL | Freq: Once | INTRAVENOUS | Status: AC
  Administered 2012-11-12: 1000 mL via INTRAVENOUS
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
## 2012-11-12 NOTE — ED Notes (Signed)
Paged ortho 
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
## 2012-11-12 NOTE — ED Notes (Signed)
Ortho at bedside.
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
## 2012-11-12 NOTE — ED Notes (Signed)
Pt discharged.Vital signs stable and GCS 15 

## 2012-11-12 NOTE — ED Notes (Signed)
Pt refusing to sign discharge .discharge signed by Child psychotherapist and wittnesed by me.Social worker wheeled pt to the taxi.
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
## 2012-11-12 NOTE — Progress Notes (Signed)
[place]  Emotional support given.

## 2012-11-12 NOTE — ED Notes (Signed)
Dale Mcfarland will pick up patient in one hour.

## 2012-11-12 NOTE — Progress Notes (Signed)
Orthopedic Tech Progress Note Patient Details:  Dale Mcfarland [birth year] [id number]  Ortho Devices Type of Ortho Device: Ace wrap;Arm sling;Long arm splint Ortho Device/Splint Location: rue Ortho Device/Splint Interventions: Application   Dale Mcfarland 11/12/2012, 2:59 PM
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
## 2012-11-17 ENCOUNTER — Emergency Department (HOSPITAL_BASED_OUTPATIENT_CLINIC_OR_DEPARTMENT_OTHER)
Admission: EM | Admit: 2012-11-17 | Discharge: 2012-11-17 | Disposition: A | Discharge status: Home / Self Care | Payer: Medicare Other | Attending: Emergency Medicine | Admitting: Emergency Medicine

## 2012-11-17 ENCOUNTER — Emergency Department (HOSPITAL_BASED_OUTPATIENT_CLINIC_OR_DEPARTMENT_OTHER): Payer: Medicare Other

## 2012-11-17 ENCOUNTER — Encounter (HOSPITAL_BASED_OUTPATIENT_CLINIC_OR_DEPARTMENT_OTHER): Payer: Self-pay | Admitting: *Deleted

## 2012-11-17 DIAGNOSIS — Z8659 Personal history of other mental and behavioral disorders: Secondary | ICD-10-CM | POA: Insufficient documentation

## 2012-11-17 DIAGNOSIS — Z5900 Homelessness unspecified: Secondary | ICD-10-CM | POA: Insufficient documentation

## 2012-11-17 DIAGNOSIS — G8911 Acute pain due to trauma: Secondary | ICD-10-CM | POA: Insufficient documentation

## 2012-11-17 DIAGNOSIS — I10 Essential (primary) hypertension: Secondary | ICD-10-CM | POA: Insufficient documentation

## 2012-11-17 DIAGNOSIS — Z880 Allergy status to penicillin: Secondary | ICD-10-CM | POA: Insufficient documentation

## 2012-11-17 DIAGNOSIS — J4489 Other specified chronic obstructive pulmonary disease: Secondary | ICD-10-CM | POA: Insufficient documentation

## 2012-11-17 DIAGNOSIS — Z8639 Personal history of other endocrine, nutritional and metabolic disease: Secondary | ICD-10-CM | POA: Insufficient documentation

## 2012-11-17 DIAGNOSIS — F17200 Nicotine dependence, unspecified, uncomplicated: Secondary | ICD-10-CM | POA: Insufficient documentation

## 2012-11-17 DIAGNOSIS — M79609 Pain in unspecified limb: Secondary | ICD-10-CM | POA: Insufficient documentation

## 2012-11-17 DIAGNOSIS — Z8701 Personal history of pneumonia (recurrent): Secondary | ICD-10-CM | POA: Insufficient documentation

## 2012-11-17 DIAGNOSIS — Z8719 Personal history of other diseases of the digestive system: Secondary | ICD-10-CM | POA: Insufficient documentation

## 2012-11-17 DIAGNOSIS — R071 Chest pain on breathing: Secondary | ICD-10-CM | POA: Insufficient documentation

## 2012-11-17 DIAGNOSIS — R0789 Other chest pain: Secondary | ICD-10-CM

## 2012-11-17 DIAGNOSIS — G8929 Other chronic pain: Secondary | ICD-10-CM | POA: Insufficient documentation

## 2012-11-17 DIAGNOSIS — M79601 Pain in right arm: Secondary | ICD-10-CM

## 2012-11-17 DIAGNOSIS — Z862 Personal history of diseases of the blood and blood-forming organs and certain disorders involving the immune mechanism: Secondary | ICD-10-CM | POA: Insufficient documentation

## 2012-11-17 DIAGNOSIS — J449 Chronic obstructive pulmonary disease, unspecified: Secondary | ICD-10-CM | POA: Insufficient documentation
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
## 2012-11-22 ENCOUNTER — Observation Stay (HOSPITAL_COMMUNITY)
Admission: EM | Admit: 2012-11-22 | Discharge: 2012-11-24 | Disposition: A | Discharge status: Home / Self Care | Payer: Medicare Other | Attending: Internal Medicine | Admitting: Internal Medicine

## 2012-11-22 ENCOUNTER — Emergency Department (HOSPITAL_COMMUNITY): Payer: Medicare Other

## 2012-11-22 ENCOUNTER — Encounter (HOSPITAL_COMMUNITY): Payer: Self-pay | Admitting: Emergency Medicine

## 2012-11-22 DIAGNOSIS — S42493A Other displaced fracture of lower end of unspecified humerus, initial encounter for closed fracture: Secondary | ICD-10-CM | POA: Insufficient documentation

## 2012-11-22 DIAGNOSIS — S42301A Unspecified fracture of shaft of humerus, right arm, initial encounter for closed fracture: Secondary | ICD-10-CM

## 2012-11-22 DIAGNOSIS — IMO0002 Reserved for concepts with insufficient information to code with codable children: Secondary | ICD-10-CM | POA: Insufficient documentation

## 2012-11-22 DIAGNOSIS — F17200 Nicotine dependence, unspecified, uncomplicated: Secondary | ICD-10-CM | POA: Insufficient documentation

## 2012-11-22 DIAGNOSIS — S42453A Displaced fracture of lateral condyle of unspecified humerus, initial encounter for closed fracture: Secondary | ICD-10-CM | POA: Insufficient documentation

## 2012-11-22 DIAGNOSIS — S2239XA Fracture of one rib, unspecified side, initial encounter for closed fracture: Secondary | ICD-10-CM

## 2012-11-22 DIAGNOSIS — J449 Chronic obstructive pulmonary disease, unspecified: Secondary | ICD-10-CM

## 2012-11-22 DIAGNOSIS — Y9289 Other specified places as the place of occurrence of the external cause: Secondary | ICD-10-CM | POA: Insufficient documentation

## 2012-11-22 DIAGNOSIS — I10 Essential (primary) hypertension: Secondary | ICD-10-CM | POA: Insufficient documentation

## 2012-11-22 DIAGNOSIS — Y998 Other external cause status: Secondary | ICD-10-CM | POA: Insufficient documentation

## 2012-11-22 DIAGNOSIS — S42401A Unspecified fracture of lower end of right humerus, initial encounter for closed fracture: Secondary | ICD-10-CM

## 2012-11-22 DIAGNOSIS — G8921 Chronic pain due to trauma: Principal | ICD-10-CM | POA: Insufficient documentation

## 2012-11-22 DIAGNOSIS — S2231XA Fracture of one rib, right side, initial encounter for closed fracture: Secondary | ICD-10-CM

## 2012-11-22 DIAGNOSIS — E860 Dehydration: Secondary | ICD-10-CM

## 2012-11-22 DIAGNOSIS — Z5900 Homelessness unspecified: Secondary | ICD-10-CM | POA: Insufficient documentation

## 2012-11-22 DIAGNOSIS — D62 Acute posthemorrhagic anemia: Secondary | ICD-10-CM

## 2012-11-22 DIAGNOSIS — D509 Iron deficiency anemia, unspecified: Secondary | ICD-10-CM | POA: Diagnosis present

## 2012-11-22 DIAGNOSIS — S2249XA Multiple fractures of ribs, unspecified side, initial encounter for closed fracture: Secondary | ICD-10-CM

## 2012-11-22 DIAGNOSIS — J4489 Other specified chronic obstructive pulmonary disease: Secondary | ICD-10-CM | POA: Insufficient documentation

## 2012-11-22 DIAGNOSIS — S42409A Unspecified fracture of lower end of unspecified humerus, initial encounter for closed fracture: Secondary | ICD-10-CM

## 2012-11-22 LAB — CBC WITH DIFFERENTIAL/PLATELET
Basophils Absolute: 0.1 10*3/uL (ref 0.0–0.1)
Basophils Relative: 0 % (ref 0–1)
Eosinophils Absolute: 0.4 10*3/uL (ref 0.0–0.7)
Hemoglobin: 8.2 g/dL — ABNORMAL LOW (ref 13.0–17.0)
MCH: 26.7 pg (ref 26.0–34.0)
MCHC: 31.7 g/dL (ref 30.0–36.0)
Monocytes Relative: 8 % (ref 3–12)
Neutro Abs: 8.8 10*3/uL — ABNORMAL HIGH (ref 1.7–7.7)
Neutrophils Relative %: 70 % (ref 43–77)
RDW: 15.6 % — ABNORMAL HIGH (ref 11.5–15.5)
WBC: 12.6 10*3/uL — ABNORMAL HIGH (ref 4.0–10.5)

## 2012-11-22 LAB — BASIC METABOLIC PANEL
BUN: 22 mg/dL (ref 6–23)
Chloride: 103 mEq/L (ref 96–112)
Creatinine, Ser: 0.77 mg/dL (ref 0.50–1.35)
GFR calc Af Amer: >90 mL/min (ref >90)
GFR calc non Af Amer: 83 mL/min — ABNORMAL LOW (ref >90)
Glucose, Bld: 86 mg/dL (ref 70–99)
Potassium: 3.4 mEq/L — ABNORMAL LOW (ref 3.5–5.1)

## 2012-11-22 MED ORDER — MORPHINE SULFATE 2 MG/ML IJ SOLN: 1.0000 mg | INTRAMUSCULAR | Status: DC | PRN

## 2012-11-22 MED ORDER — MORPHINE SULFATE 4 MG/ML IJ SOLN
4.0000 mg | Freq: Once | INTRAMUSCULAR | Status: AC
  Administered 2012-11-22: 4 mg via INTRAVENOUS
  Filled 2012-11-22: qty 1

## 2012-11-22 MED ORDER — OXYCODONE-ACETAMINOPHEN 5-325 MG PO TABS: 2.0000 | ORAL_TABLET | Freq: Once | ORAL | Status: DC

## 2012-11-22 MED ORDER — ALBUTEROL SULFATE (5 MG/ML) 0.5% IN NEBU: 2.5000 mg | INHALATION_SOLUTION | RESPIRATORY_TRACT | Status: DC | PRN

## 2012-11-22 MED ORDER — OXYCODONE-ACETAMINOPHEN 5-325 MG PO TABS
1.0000 | ORAL_TABLET | Freq: Once | ORAL | Status: AC
  Administered 2012-11-22: 1 via ORAL
  Filled 2012-11-22: qty 1

## 2012-11-22 MED ORDER — POTASSIUM CHLORIDE CRYS ER 20 MEQ PO TBCR
20.0000 meq | EXTENDED_RELEASE_TABLET | Freq: Once | ORAL | Status: AC
  Administered 2012-11-23: 20 meq via ORAL
  Filled 2012-11-22: qty 1

## 2012-11-22 MED ORDER — SODIUM CHLORIDE 0.9 % IJ SOLN
3.0000 mL | Freq: Two times a day (BID) | INTRAMUSCULAR | Status: DC
  Administered 2012-11-22: 3 mL via INTRAVENOUS

## 2012-11-22 MED ORDER — OXYCODONE HCL 5 MG PO TABS: 5.0000 mg | ORAL_TABLET | Freq: Four times a day (QID) | ORAL | Status: DC | PRN

## 2012-11-22 MED ORDER — ACETAMINOPHEN 325 MG PO TABS
650.0000 mg | ORAL_TABLET | Freq: Four times a day (QID) | ORAL | Status: DC | PRN
  Administered 2012-11-24: 650 mg via ORAL
  Filled 2012-11-22: qty 2

## 2012-11-22 MED ORDER — ONDANSETRON HCL 4 MG/2ML IJ SOLN: 4.0000 mg | Freq: Four times a day (QID) | INTRAMUSCULAR | Status: DC | PRN

## 2012-11-22 MED ORDER — ACETAMINOPHEN 650 MG RE SUPP: 650.0000 mg | Freq: Four times a day (QID) | RECTAL | Status: DC | PRN

## 2012-11-22 MED ORDER — ONDANSETRON HCL 4 MG PO TABS: 4.0000 mg | ORAL_TABLET | Freq: Four times a day (QID) | ORAL | Status: DC | PRN
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
## 2012-11-22 NOTE — ED Notes (Signed)
Dr. Toniann Fail at bedside evaluating pt.
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
## 2012-11-23 DIAGNOSIS — E860 Dehydration: Secondary | ICD-10-CM

## 2012-11-23 DIAGNOSIS — Z590 Homelessness: Secondary | ICD-10-CM

## 2012-11-23 DIAGNOSIS — J449 Chronic obstructive pulmonary disease, unspecified: Secondary | ICD-10-CM

## 2012-11-23 LAB — URINALYSIS, ROUTINE W REFLEX MICROSCOPIC
Hgb urine dipstick: NEGATIVE
Ketones, ur: NEGATIVE mg/dL
Nitrite: NEGATIVE
Specific Gravity, Urine: 1.03 (ref 1.005–1.030)
Urobilinogen, UA: 1 mg/dL (ref 0.0–1.0)

## 2012-11-23 LAB — CBC
HCT: 25.5 % — ABNORMAL LOW (ref 39.0–52.0)
Hemoglobin: 8 g/dL — ABNORMAL LOW (ref 13.0–17.0)
MCH: 26.7 pg (ref 26.0–34.0)
MCV: 85 fL (ref 78.0–100.0)
Platelets: 389 10*3/uL (ref 150–400)
RBC: 3 MIL/uL — ABNORMAL LOW (ref 4.22–5.81)
RDW: 15.7 % — ABNORMAL HIGH (ref 11.5–15.5)
WBC: 11.2 10*3/uL — ABNORMAL HIGH (ref 4.0–10.5)

## 2012-11-23 LAB — BASIC METABOLIC PANEL
BUN: 19 mg/dL (ref 6–23)
Calcium: 8 mg/dL — ABNORMAL LOW (ref 8.4–10.5)
Chloride: 103 mEq/L (ref 96–112)
Creatinine, Ser: 0.76 mg/dL (ref 0.50–1.35)
GFR calc non Af Amer: 83 mL/min — ABNORMAL LOW (ref >90)
Glucose, Bld: 86 mg/dL (ref 70–99)
Potassium: 3.3 mEq/L — ABNORMAL LOW (ref 3.5–5.1)

## 2012-11-23 MED ORDER — DIPHENHYDRAMINE HCL 25 MG PO CAPS
25.0000 mg | ORAL_CAPSULE | Freq: Four times a day (QID) | ORAL | Status: DC | PRN
  Administered 2012-11-23: 25 mg via ORAL
  Filled 2012-11-23: qty 1

## 2012-11-23 MED ORDER — INFLUENZA VAC SPLIT QUAD 0.5 ML IM SUSP
0.5000 mL | INTRAMUSCULAR | Status: DC
  Filled 2012-11-23: qty 0.5

## 2012-11-23 MED ORDER — PNEUMOCOCCAL VAC POLYVALENT 25 MCG/0.5ML IJ INJ
0.5000 mL | INJECTION | INTRAMUSCULAR | Status: DC
  Filled 2012-11-23: qty 0.5
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
## 2012-11-23 NOTE — Progress Notes (Signed)
Patient asked that we contact his daughter.  Left message on daughter's work voicemail [phone]).  Will attempt to contact again tomorrow.

## 2012-11-24 MED ORDER — ACETAMINOPHEN 325 MG PO TABS: 650.0000 mg | ORAL_TABLET | Freq: Four times a day (QID) | ORAL | Status: DC | PRN

## 2012-11-24 NOTE — Progress Notes (Signed)
Daughter called and requested Physician call her prior to patient D/C- called and stated he called x 2 with no answer and left message.

## 2012-11-24 NOTE — Progress Notes (Signed)
SW provided patient with 2 bus passes and homeless shelter resources. Patient was extremely rude and stated" I want a taxi and you will hear from my lawyer". CSW informed patient that he has been discharged and we would not be providing a taxi voucher for him. Clinical Social Worker will sign off for now as social work intervention is no longer needed. Please consult [place] again if new need arises.

## 2012-11-24 NOTE — Progress Notes (Signed)
Patient refuses D/C instructions, threw away list from Social work, took bus passes . Up ambulating and using arms  to pick up things in room. D/C in stable condition.
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
## 2012-11-26 ENCOUNTER — Encounter (HOSPITAL_COMMUNITY): Payer: Self-pay | Admitting: Emergency Medicine

## 2012-11-26 ENCOUNTER — Emergency Department (HOSPITAL_COMMUNITY)
Admission: EM | Admit: 2012-11-26 | Discharge: 2012-11-27 | Disposition: A | Discharge status: Home / Self Care | Payer: Medicare Other | Attending: Emergency Medicine | Admitting: Emergency Medicine

## 2012-11-26 DIAGNOSIS — S42401S Unspecified fracture of lower end of right humerus, sequela: Secondary | ICD-10-CM

## 2012-11-26 DIAGNOSIS — I10 Essential (primary) hypertension: Secondary | ICD-10-CM | POA: Insufficient documentation

## 2012-11-26 DIAGNOSIS — J4489 Other specified chronic obstructive pulmonary disease: Secondary | ICD-10-CM | POA: Insufficient documentation

## 2012-11-26 DIAGNOSIS — Z5900 Homelessness unspecified: Secondary | ICD-10-CM | POA: Insufficient documentation

## 2012-11-26 DIAGNOSIS — G8929 Other chronic pain: Secondary | ICD-10-CM | POA: Insufficient documentation

## 2012-11-26 DIAGNOSIS — M549 Dorsalgia, unspecified: Secondary | ICD-10-CM | POA: Insufficient documentation

## 2012-11-26 DIAGNOSIS — F3289 Other specified depressive episodes: Secondary | ICD-10-CM | POA: Insufficient documentation

## 2012-11-26 DIAGNOSIS — Z880 Allergy status to penicillin: Secondary | ICD-10-CM | POA: Insufficient documentation

## 2012-11-26 DIAGNOSIS — S2231XS Fracture of one rib, right side, sequela: Secondary | ICD-10-CM

## 2012-11-26 DIAGNOSIS — Z79899 Other long term (current) drug therapy: Secondary | ICD-10-CM | POA: Insufficient documentation

## 2012-11-26 DIAGNOSIS — Z862 Personal history of diseases of the blood and blood-forming organs and certain disorders involving the immune mechanism: Secondary | ICD-10-CM | POA: Insufficient documentation

## 2012-11-26 DIAGNOSIS — M25539 Pain in unspecified wrist: Secondary | ICD-10-CM | POA: Insufficient documentation

## 2012-11-26 DIAGNOSIS — Z792 Long term (current) use of antibiotics: Secondary | ICD-10-CM | POA: Insufficient documentation

## 2012-11-26 DIAGNOSIS — Z8701 Personal history of pneumonia (recurrent): Secondary | ICD-10-CM | POA: Insufficient documentation

## 2012-11-26 DIAGNOSIS — G8911 Acute pain due to trauma: Secondary | ICD-10-CM | POA: Insufficient documentation

## 2012-11-26 DIAGNOSIS — R071 Chest pain on breathing: Secondary | ICD-10-CM | POA: Insufficient documentation

## 2012-11-26 DIAGNOSIS — F329 Major depressive disorder, single episode, unspecified: Secondary | ICD-10-CM | POA: Insufficient documentation

## 2012-11-26 DIAGNOSIS — F17200 Nicotine dependence, unspecified, uncomplicated: Secondary | ICD-10-CM | POA: Insufficient documentation

## 2012-11-26 DIAGNOSIS — J449 Chronic obstructive pulmonary disease, unspecified: Secondary | ICD-10-CM | POA: Insufficient documentation

## 2012-11-26 NOTE — ED Notes (Signed)
Pt here with generalized pain to all four extremities.  Reporting more pain to right leg.  Pt sts he has "bronchitis".  Pt seen here last night for same complaint.  Pt ambulatory with EMS.

## 2012-11-26 NOTE — ED Provider Notes (Signed)
CSN: [id number]     Arrival date & time 11/26/12  2013 History   First MD Initiated Contact with Patient 11/26/12 2316     Chief Complaint  Patient presents with  . Pain   (Consider location/radiation/quality/duration/timing/severity/associated sxs/prior Treatment) HPI This 79 year old male is homeless he has COPD and chronic back pain which are both stable, he has chronic shortness of breath which is stable and unchanged today, he is recent rib fractures and recent right elbow fracture and was recently admitted for pain control and discharged to follow up with orthopedics, the patient has not followed up with orthopedics, he's using a sling for his right elbow intermittently, he states he returned to the emergency department because he wants to be admitted to the hospital because he is homeless and has no place to go and does not want to be in a shelter because he was told he would have to leave the shelter in the morning and return by a certain time in the evening to be able to stay in a shelter he does not want to do that, he denies any headache new shortness of breath new chest wall pain new other chest pain abdominal pain weakness numbness denies any threats to harm himself or others denies suicidal or homicidal ideation hallucinations or other concerns. Past Medical History  Diagnosis Date  . COPD (chronic obstructive pulmonary disease)   . Chronic back pain   . Pneumonia     01/2011 - CAP vs aspiration pneuonmia  . Depression   . PONV (postoperative nausea and vomiting)   . Hyponatremia     Previously felt secondary to SIADH  . Hypertension   . Upper GI bleed     01/2011 with EGD showing severe candida esophagitis and duodenal bulb erosion  . Anemia     In the setting of UGI bleed 01/2011 requiring blood transfusion  . Homelessness   . Dyspnea     Chronic, thought due to COPD. Echo 02/2010 with EF 30-35%, nuclear study negative, then repeat echo 03/2011 did not show systolic dysfxn,  so unclear if truly HF  . Bronchitis    Past Surgical History  Procedure Laterality Date  . Tonsillectomy    . Vasectomy    . Appendectomy    . Tonsillectomy    . Colonoscopy    . Esophagogastroduodenoscopy    . Esophagogastroduodenoscopy  02/03/2011    Procedure: ESOPHAGOGASTRODUODENOSCOPY (EGD);  Surgeon: Charna Elizabeth, MD;  Location: [facility];  Service: Endoscopy;  Laterality: N/A;  . Esophagogastroduodenoscopy  02/03/2011    Procedure: ESOPHAGOGASTRODUODENOSCOPY (EGD);  Surgeon: Charna Elizabeth, MD;  Location: [facility];  Service: Endoscopy;  Laterality: N/A;  . Colonoscopy  01/30/2012    Procedure: COLONOSCOPY;  Surgeon: Meryl Dare, MD,FACG;  Location: [facility];  Service: Endoscopy;  Laterality: N/A;   Family History  Problem Relation Age of Onset  . Coronary artery disease Father     Starting in his 63's. Died of massive MI at age 44.  . Schizophrenia Brother   . Coronary artery disease Sister     MI at age 58  . Alzheimer's disease Mother    History  Substance Use Topics  . Smoking status: Current Some Day Smoker -- 1.00 packs/day for 60 years    Types: Cigarettes  . Smokeless tobacco: Never Used     Comment: Since age 63  . Alcohol Use: Yes     Comment: 174 ml of scotch weekly    Review of Systems 10  Systems reviewed and are negative for acute change except as noted in the HPI.  Allergies  Benadryl and Penicillins  Home Medications   Current Outpatient Rx  Name  Route  Sig  Dispense  Refill  . amoxicillin-clavulanate (AUGMENTIN) 875-125 MG per tablet   Oral   Take 1 tablet by mouth 2 (two) times daily.   10 tablet   0   . metoprolol tartrate (LOPRESSOR) 25 MG tablet   Oral   Take 2 tablets (50 mg total) by mouth 2 (two) times daily.   60 tablet   0   . omeprazole (PRILOSEC) 40 MG capsule   Oral   Take 1 capsule (40 mg total) by mouth daily.   30 capsule   2   . traMADol (ULTRAM) 50 MG tablet   Oral   Take 1 tablet (50 mg total) by  mouth every 6 (six) hours as needed for pain.   15 tablet   0    BP 140/67  Pulse 74  Temp(Src) 99.4 F (37.4 C) (Oral)  Resp 23  Ht 5\' 7"  (1.702 m)  Wt 132 lb (59.875 kg)  BMI 20.67 kg/m2  SpO2 98% Physical Exam  Nursing note and vitals reviewed. Constitutional:  Awake, alert, nontoxic appearance.  HENT:  Head: Atraumatic.  Eyes: Right eye exhibits no discharge. Left eye exhibits no discharge.  Neck: Neck supple.  Cardiovascular: Normal rate and regular rhythm.   No murmur heard. Pulmonary/Chest: Effort normal and breath sounds normal. No respiratory distress. He has no wheezes. He has no rales. He exhibits tenderness.  Right chest wall tenderness without crepitus noted no rest of distress noted pulse oximetry normal room air 100%  Abdominal: Soft. There is no tenderness. There is no rebound.  Musculoskeletal: He exhibits tenderness. He exhibits no edema.  Baseline ROM, no obvious new focal weakness. Left arm and both legs nontender. Right arm is nontender the shoulder wrist and hand right elbow is mildly tender without appreciable swelling or gross deformity noted; right arm is normal light touch radial pulse intact refill is 2 seconds intact strength and light touch right hand in the distributions of the median radial and ulnar nerve function; he has a well-healing abrasion over the right elbow without erythema fluctuance or purulent drainage  Neurological:  Mental status and motor strength appears baseline for patient and situation.  Skin: No rash noted.  Psychiatric: He has a normal mood and affect.    ED Course  Procedures (including critical care time) D/w SW no shelter for Pt tonight.Patient informed of clinical course, understand medical decision-making process, and agree with plan. Labs Review Labs Reviewed - No data to display Imaging Review No results found.  EKG Interpretation   None       MDM   1. Homelessness   2. Rib fractures, right, sequela   3.  Elbow fracture, right, sequela    I doubt any other EMC precluding discharge at this time including, but not necessarily limited to the following:new trauma.    Hurman Horn, MD 12/13/12 2039
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
## 2012-11-30 ENCOUNTER — Emergency Department (HOSPITAL_COMMUNITY): Payer: Medicare Other

## 2012-11-30 ENCOUNTER — Emergency Department (HOSPITAL_COMMUNITY)
Admission: EM | Admit: 2012-11-30 | Discharge: 2012-12-01 | Disposition: A | Discharge status: Home / Self Care | Payer: Medicare Other | Source: Home / Self Care | Attending: Emergency Medicine | Admitting: Emergency Medicine

## 2012-11-30 ENCOUNTER — Encounter (HOSPITAL_COMMUNITY): Payer: Self-pay | Admitting: Radiology

## 2012-11-30 DIAGNOSIS — Z862 Personal history of diseases of the blood and blood-forming organs and certain disorders involving the immune mechanism: Secondary | ICD-10-CM | POA: Insufficient documentation

## 2012-11-30 DIAGNOSIS — Z8639 Personal history of other endocrine, nutritional and metabolic disease: Secondary | ICD-10-CM | POA: Insufficient documentation

## 2012-11-30 DIAGNOSIS — G8911 Acute pain due to trauma: Secondary | ICD-10-CM | POA: Insufficient documentation

## 2012-11-30 DIAGNOSIS — Z87828 Personal history of other (healed) physical injury and trauma: Secondary | ICD-10-CM | POA: Insufficient documentation

## 2012-11-30 DIAGNOSIS — Z8701 Personal history of pneumonia (recurrent): Secondary | ICD-10-CM | POA: Insufficient documentation

## 2012-11-30 DIAGNOSIS — J4489 Other specified chronic obstructive pulmonary disease: Secondary | ICD-10-CM | POA: Insufficient documentation

## 2012-11-30 DIAGNOSIS — M549 Dorsalgia, unspecified: Secondary | ICD-10-CM | POA: Insufficient documentation

## 2012-11-30 DIAGNOSIS — Z8719 Personal history of other diseases of the digestive system: Secondary | ICD-10-CM | POA: Insufficient documentation

## 2012-11-30 DIAGNOSIS — G8929 Other chronic pain: Secondary | ICD-10-CM | POA: Insufficient documentation

## 2012-11-30 DIAGNOSIS — I10 Essential (primary) hypertension: Secondary | ICD-10-CM | POA: Insufficient documentation

## 2012-11-30 DIAGNOSIS — F17200 Nicotine dependence, unspecified, uncomplicated: Secondary | ICD-10-CM | POA: Insufficient documentation

## 2012-11-30 DIAGNOSIS — R109 Unspecified abdominal pain: Secondary | ICD-10-CM | POA: Insufficient documentation

## 2012-11-30 DIAGNOSIS — Z8659 Personal history of other mental and behavioral disorders: Secondary | ICD-10-CM | POA: Insufficient documentation

## 2012-11-30 DIAGNOSIS — J449 Chronic obstructive pulmonary disease, unspecified: Secondary | ICD-10-CM | POA: Insufficient documentation

## 2012-11-30 DIAGNOSIS — M25521 Pain in right elbow: Secondary | ICD-10-CM

## 2012-11-30 DIAGNOSIS — M25529 Pain in unspecified elbow: Secondary | ICD-10-CM | POA: Insufficient documentation

## 2012-11-30 DIAGNOSIS — Z880 Allergy status to penicillin: Secondary | ICD-10-CM | POA: Insufficient documentation

## 2012-11-30 LAB — CBC
HCT: 32.1 % — ABNORMAL LOW (ref 39.0–52.0)
Hemoglobin: 10.3 g/dL — ABNORMAL LOW (ref 13.0–17.0)
MCH: 26.9 pg (ref 26.0–34.0)
MCHC: 32.1 g/dL (ref 30.0–36.0)
Platelets: 545 10*3/uL — ABNORMAL HIGH (ref 150–400)
RBC: 3.83 MIL/uL — ABNORMAL LOW (ref 4.22–5.81)
WBC: 10.9 10*3/uL — ABNORMAL HIGH (ref 4.0–10.5)

## 2012-11-30 MED ORDER — OXYCODONE-ACETAMINOPHEN 5-325 MG PO TABS
1.0000 | ORAL_TABLET | Freq: Once | ORAL | Status: AC
  Administered 2012-11-30: 1 via ORAL
  Filled 2012-11-30: qty 1

## 2012-11-30 MED ORDER — TRAMADOL HCL 50 MG PO TABS: 50.0000 mg | ORAL_TABLET | Freq: Four times a day (QID) | ORAL | Status: DC | PRN

## 2012-11-30 MED ORDER — IOHEXOL 300 MG/ML  SOLN: 100.0000 mL | Freq: Once | INTRAMUSCULAR | Status: AC | PRN

## 2012-11-30 NOTE — ED Notes (Signed)
Pt also c/o bronchitis
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
## 2012-11-30 NOTE — ED Notes (Signed)
Off the floor to CT 

## 2012-12-01 MED ORDER — ALBUTEROL SULFATE HFA 108 (90 BASE) MCG/ACT IN AERS
2.0000 | INHALATION_SPRAY | Freq: Once | RESPIRATORY_TRACT | Status: AC
  Administered 2012-12-01: 2 via RESPIRATORY_TRACT
  Filled 2012-12-01: qty 6.7

## 2012-12-01 NOTE — Progress Notes (Signed)
Orthopedic Tech Progress Note Patient Details:  Dale Mcfarland 05/07/1929 [id number]  Ortho Devices Type of Ortho Device: Arm sling   Haskell Flirt 12/01/2012, 12:05 AM

## 2012-12-02 ENCOUNTER — Encounter (HOSPITAL_COMMUNITY): Payer: Self-pay | Admitting: Emergency Medicine

## 2012-12-02 ENCOUNTER — Inpatient Hospital Stay (HOSPITAL_COMMUNITY)
Admission: EM | Admit: 2012-12-02 | Discharge: 2012-12-04 | DRG: 194 | Disposition: A | Discharge status: Home / Self Care | Payer: Medicare Other | Attending: Internal Medicine | Admitting: Internal Medicine

## 2012-12-02 DIAGNOSIS — K922 Gastrointestinal hemorrhage, unspecified: Secondary | ICD-10-CM

## 2012-12-02 DIAGNOSIS — K228 Other specified diseases of esophagus: Secondary | ICD-10-CM

## 2012-12-02 DIAGNOSIS — K229 Disease of esophagus, unspecified: Secondary | ICD-10-CM | POA: Diagnosis present

## 2012-12-02 DIAGNOSIS — D72829 Elevated white blood cell count, unspecified: Secondary | ICD-10-CM

## 2012-12-02 DIAGNOSIS — F3289 Other specified depressive episodes: Secondary | ICD-10-CM | POA: Diagnosis present

## 2012-12-02 DIAGNOSIS — Z5900 Homelessness unspecified: Secondary | ICD-10-CM

## 2012-12-02 DIAGNOSIS — D62 Acute posthemorrhagic anemia: Secondary | ICD-10-CM

## 2012-12-02 DIAGNOSIS — D509 Iron deficiency anemia, unspecified: Secondary | ICD-10-CM

## 2012-12-02 DIAGNOSIS — S2239XA Fracture of one rib, unspecified side, initial encounter for closed fracture: Secondary | ICD-10-CM | POA: Diagnosis present

## 2012-12-02 DIAGNOSIS — F329 Major depressive disorder, single episode, unspecified: Secondary | ICD-10-CM | POA: Diagnosis present

## 2012-12-02 DIAGNOSIS — I509 Heart failure, unspecified: Secondary | ICD-10-CM

## 2012-12-02 DIAGNOSIS — J4489 Other specified chronic obstructive pulmonary disease: Secondary | ICD-10-CM | POA: Diagnosis present

## 2012-12-02 DIAGNOSIS — R0602 Shortness of breath: Secondary | ICD-10-CM

## 2012-12-02 DIAGNOSIS — M549 Dorsalgia, unspecified: Secondary | ICD-10-CM | POA: Diagnosis present

## 2012-12-02 DIAGNOSIS — S2239XS Fracture of one rib, unspecified side, sequela: Secondary | ICD-10-CM

## 2012-12-02 DIAGNOSIS — R55 Syncope and collapse: Secondary | ICD-10-CM

## 2012-12-02 DIAGNOSIS — I10 Essential (primary) hypertension: Secondary | ICD-10-CM | POA: Diagnosis present

## 2012-12-02 DIAGNOSIS — J189 Pneumonia, unspecified organism: Principal | ICD-10-CM | POA: Diagnosis present

## 2012-12-02 DIAGNOSIS — F17200 Nicotine dependence, unspecified, uncomplicated: Secondary | ICD-10-CM | POA: Diagnosis present

## 2012-12-02 DIAGNOSIS — E860 Dehydration: Secondary | ICD-10-CM

## 2012-12-02 DIAGNOSIS — G8929 Other chronic pain: Secondary | ICD-10-CM | POA: Diagnosis present

## 2012-12-02 DIAGNOSIS — J449 Chronic obstructive pulmonary disease, unspecified: Secondary | ICD-10-CM | POA: Diagnosis present

## 2012-12-02 DIAGNOSIS — Z720 Tobacco use: Secondary | ICD-10-CM

## 2012-12-02 DIAGNOSIS — R195 Other fecal abnormalities: Secondary | ICD-10-CM

## 2012-12-02 DIAGNOSIS — D126 Benign neoplasm of colon, unspecified: Secondary | ICD-10-CM

## 2012-12-02 DIAGNOSIS — F1020 Alcohol dependence, uncomplicated: Secondary | ICD-10-CM | POA: Diagnosis present

## 2012-12-02 DIAGNOSIS — R9389 Abnormal findings on diagnostic imaging of other specified body structures: Secondary | ICD-10-CM | POA: Diagnosis present

## 2012-12-02 NOTE — ED Notes (Signed)
Presents with generalized pain and achiness. Discharged from here yesterday. Smells of ETOH>

## 2012-12-03 ENCOUNTER — Encounter (HOSPITAL_COMMUNITY): Payer: Self-pay | Admitting: Cardiology

## 2012-12-03 ENCOUNTER — Emergency Department (HOSPITAL_COMMUNITY): Payer: Medicare Other

## 2012-12-03 DIAGNOSIS — IMO0002 Reserved for concepts with insufficient information to code with codable children: Secondary | ICD-10-CM

## 2012-12-03 DIAGNOSIS — J449 Chronic obstructive pulmonary disease, unspecified: Secondary | ICD-10-CM

## 2012-12-03 DIAGNOSIS — Z5900 Homelessness unspecified: Secondary | ICD-10-CM

## 2012-12-03 DIAGNOSIS — K229 Disease of esophagus, unspecified: Secondary | ICD-10-CM

## 2012-12-03 DIAGNOSIS — K2289 Other specified disease of esophagus: Secondary | ICD-10-CM | POA: Diagnosis present

## 2012-12-03 DIAGNOSIS — K228 Other specified diseases of esophagus: Secondary | ICD-10-CM | POA: Diagnosis present

## 2012-12-03 DIAGNOSIS — J4489 Other specified chronic obstructive pulmonary disease: Secondary | ICD-10-CM

## 2012-12-03 DIAGNOSIS — F1020 Alcohol dependence, uncomplicated: Secondary | ICD-10-CM

## 2012-12-03 DIAGNOSIS — R0602 Shortness of breath: Secondary | ICD-10-CM

## 2012-12-03 DIAGNOSIS — D62 Acute posthemorrhagic anemia: Secondary | ICD-10-CM

## 2012-12-03 DIAGNOSIS — D126 Benign neoplasm of colon, unspecified: Secondary | ICD-10-CM

## 2012-12-03 DIAGNOSIS — J189 Pneumonia, unspecified organism: Principal | ICD-10-CM

## 2012-12-03 LAB — POCT I-STAT, CHEM 8
BUN: 19 mg/dL (ref 6–23)
Calcium, Ion: 1.11 mmol/L — ABNORMAL LOW (ref 1.13–1.30)
Chloride: 102 mEq/L (ref 96–112)
Potassium: 4.4 mEq/L (ref 3.5–5.1)
Sodium: 137 mEq/L (ref 135–145)

## 2012-12-03 LAB — CBC WITH DIFFERENTIAL/PLATELET
Basophils Absolute: 0.1 10*3/uL (ref 0.0–0.1)
Eosinophils Relative: 8 % — ABNORMAL HIGH (ref 0–5)
HCT: 30.9 % — ABNORMAL LOW (ref 39.0–52.0)
Hemoglobin: 9.9 g/dL — ABNORMAL LOW (ref 13.0–17.0)
Lymphocytes Relative: 23 % (ref 12–46)
Lymphs Abs: 2.8 10*3/uL (ref 0.7–4.0)
MCV: 84.2 fL (ref 78.0–100.0)
Monocytes Absolute: 0.9 10*3/uL (ref 0.1–1.0)
Monocytes Relative: 7 % (ref 3–12)
RBC: 3.67 MIL/uL — ABNORMAL LOW (ref 4.22–5.81)
RDW: 15.1 % (ref 11.5–15.5)
WBC: 11.8 10*3/uL — ABNORMAL HIGH (ref 4.0–10.5)

## 2012-12-03 LAB — STREP PNEUMONIAE URINARY ANTIGEN: Strep Pneumo Urinary Antigen: NEGATIVE

## 2012-12-03 LAB — ETHANOL: Alcohol, Ethyl (B): <11 mg/dL (ref 0–11)

## 2012-12-03 LAB — POCT I-STAT TROPONIN I: Troponin i, poc: 0.01 ng/mL (ref 0.00–0.08)

## 2012-12-03 MED ORDER — HYDROCODONE-ACETAMINOPHEN 5-325 MG PO TABS
1.0000 | ORAL_TABLET | Freq: Once | ORAL | Status: AC
  Administered 2012-12-03: 1 via ORAL
  Filled 2012-12-03: qty 1

## 2012-12-03 MED ORDER — SODIUM CHLORIDE 0.9 % IV SOLN
3.0000 g | Freq: Four times a day (QID) | INTRAVENOUS | Status: DC
  Administered 2012-12-03 – 2012-12-04 (×4): 3 g via INTRAVENOUS
  Filled 2012-12-03 (×6): qty 3

## 2012-12-03 MED ORDER — ALBUTEROL SULFATE (5 MG/ML) 0.5% IN NEBU
5.0000 mg | INHALATION_SOLUTION | Freq: Once | RESPIRATORY_TRACT | Status: AC
  Administered 2012-12-03: 5 mg via RESPIRATORY_TRACT
  Filled 2012-12-03: qty 1

## 2012-12-03 MED ORDER — SODIUM CHLORIDE 0.9 % IJ SOLN
3.0000 mL | Freq: Two times a day (BID) | INTRAMUSCULAR | Status: DC
  Administered 2012-12-03 (×2): 3 mL via INTRAVENOUS

## 2012-12-03 MED ORDER — HYDROCODONE-ACETAMINOPHEN 5-325 MG PO TABS
1.0000 | ORAL_TABLET | Freq: Four times a day (QID) | ORAL | Status: DC | PRN
  Administered 2012-12-04: 1 via ORAL
  Filled 2012-12-03: qty 1

## 2012-12-03 MED ORDER — SODIUM CHLORIDE 0.9 % IJ SOLN: 3.0000 mL | INTRAMUSCULAR | Status: DC | PRN

## 2012-12-03 MED ORDER — DEXTROSE 5 % IV SOLN
1.0000 g | Freq: Once | INTRAVENOUS | Status: AC
  Administered 2012-12-03: 1 g via INTRAVENOUS
  Filled 2012-12-03: qty 10

## 2012-12-03 MED ORDER — IPRATROPIUM BROMIDE 0.02 % IN SOLN
0.5000 mg | Freq: Once | RESPIRATORY_TRACT | Status: AC
  Administered 2012-12-03: 0.5 mg via RESPIRATORY_TRACT
  Filled 2012-12-03: qty 2.5

## 2012-12-03 MED ORDER — DEXTROSE 5 % IV SOLN
1.0000 g | Freq: Every day | INTRAVENOUS | Status: DC
  Filled 2012-12-03: qty 10

## 2012-12-03 MED ORDER — ALBUTEROL SULFATE (5 MG/ML) 0.5% IN NEBU
2.5000 mg | INHALATION_SOLUTION | RESPIRATORY_TRACT | Status: DC | PRN
  Filled 2012-12-03: qty 0.5

## 2012-12-03 MED ORDER — DEXTROSE 5 % IV SOLN
500.0000 mg | Freq: Once | INTRAVENOUS | Status: AC
  Administered 2012-12-03: 500 mg via INTRAVENOUS

## 2012-12-03 MED ORDER — DEXTROSE 5 % IV SOLN
500.0000 mg | Freq: Every day | INTRAVENOUS | Status: DC
  Administered 2012-12-03: 500 mg via INTRAVENOUS
  Filled 2012-12-03 (×2): qty 500

## 2012-12-03 MED ORDER — HEPARIN SODIUM (PORCINE) 5000 UNIT/ML IJ SOLN
5000.0000 [IU] | Freq: Three times a day (TID) | INTRAMUSCULAR | Status: DC
  Administered 2012-12-03 – 2012-12-04 (×3): 5000 [IU] via SUBCUTANEOUS
  Filled 2012-12-03 (×6): qty 1

## 2012-12-03 MED ORDER — SODIUM CHLORIDE 0.9 % IV SOLN: 250.0000 mL | INTRAVENOUS | Status: DC | PRN
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
## 2012-12-03 NOTE — Progress Notes (Signed)
Attempt to call Ed nurse at [phone] for report, ED RN  states that he will call back later.

## 2012-12-03 NOTE — Progress Notes (Signed)
CSW went into patients room with RN to provide [facility] housing resource to patient. CSW provided patient with [employer]. Patient states at this time he is not interested. CSW left resource with patient and is signing off at this time.  Maree Krabbe, MSW, Theresia Majors [phone]
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
## 2012-12-03 NOTE — Care Management Note (Addendum)
    Page 1 of 2   12/04/2012     5:34:49 PM   CARE MANAGEMENT NOTE 12/04/2012  Patient:  Dale Mcfarland, Dale Mcfarland   Account Number:  [id number]  Date Initiated:  12/03/2012  Documentation initiated by:  Skyelyn Scruggs  Subjective/Objective Assessment:   PT ADM ON 12/02/12 WITH PNEUMONIA.  PTA, PT INDEPENDENT AND HOMELESS.     Action/Plan:   CSW CONSULTED TO MEET WITH PT TO DISCUSS HOMELESS ISSUES. WILL FOLLOW PROGRESS.   Anticipated DC Date:  12/04/2012   Anticipated DC Plan:  HOME/SELF CARE  In-house referral  Clinical Social Worker      DC Planning Services  CM consult      Choice offered to / List presented to:             Status of service:  Completed, signed off Medicare Important Message given?   (If response is "NO", the following Medicare IM given date fields will be blank) Date Medicare IM given:   Date Additional Medicare IM given:    Discharge Disposition:  HOME/SELF CARE  Per UR Regulation:  Reviewed for med. necessity/level of care/duration of stay  If discussed at Long Length of Stay Meetings, dates discussed:    Comments:  12/04/12 Dale Sleeper,RN,BSN [phone] 5:00PM RECEIVED CALLBACK FROM Dale Mcfarland, PT'S DAUGHTER:  SHE STATES THAT SHE LOVES HER DAD AND CARES FOR HIM, BUT IS UNABLE TO HELP HIM AT THIS TIME.  SHE HAS ENTERED BACK INTO HIS LIFE FREQUENTLY OVER THE PAST FEW YEARS, AND STATES HAS "ENABLED" HIM TO CONT HIS CURRENT LIFESTYLE.  SHE HAS TRIED TO GET PT INTO SNF OR SOME SORT OF HOUSING SITUATION, BUT PT WILL NOT STAY OR ABIDE BY FACILITY RULES.  SHE STATES HER SISTER IS NOT INVOLVED WITH HER FATHER ANYMORE.  SHE KNOWS THAT PT IS MENTALLY ILL, YET STILL HAS DECISION MAKING CAPACITY, WHICH MAKES IT VERY DIFFICULT TO PROVIDE SERVICES FOR HIM.  PROVIDED EMOTIONAL SUPPORT FOR DAUGHTER, WHO STATES SHE IS IN THERAPY HERSELF TO HELP HER WITH HER FEELINGS OF GUILT AND REMORSE.  SHE ASKED THAT I PASS ON A MESSAGE TO PT THAT SHE LOVES HIM, BUT CANNOT HELP HIM.  PT  RESPONDED, "WELL, I WILL JUST CROSS HER OFF THE LIST."  12/04/12 Dale Starace,RN,BSN [phone] PT HAS NO PCP; MADE FOLLOW UP APPT FOR [facility] FOR NOVEMBER 17 AT 12:00.  APPT ADDED TO AVS.  12/04/12 Dale Lehenbauer,RN,BSN [phone] ATTEMPTED TO REACH PT'S DAUGHTER, Dale Mcfarland, AT PT'S REQUEST, TO SEE IF SHE WILL PICK PT UP/ASSIST HIM WITH DC MEDS.  CALLED PT AT [facility] [phone], EXT [phone], AND LEFT MESSAGE ON VOICEMAIL.  PHONE # IN CHART HAS BEEN DISCONNECTED.  PT STATES HE HAS NO MONEY AND UNABLE TO GET RX FILLED UNTIL AFTER THE 1ST OF NOV, WHEN HE GETS HIS CHECK. PT HAS MEDICARE AND [facility] AS PAYOR SOURCE, SO IS NOT ELIGIBLE FOR MATCH PROGRAM.  PT STATES HE MAY BE ABLE TO BORROW MONEY FROM SOMEONE.
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
## 2012-12-03 NOTE — Progress Notes (Signed)
UR Completed.  Raegyn Renda Jane [phone] 12/03/2012  

## 2012-12-03 NOTE — Progress Notes (Signed)
Patient had run of SVT @ 142. Pt asymptomatic. Pt lying in bed. Will continue to monitor.    Sahej Hauswirth,RN,MSN
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
## 2012-12-04 LAB — LEGIONELLA ANTIGEN, URINE: Legionella Antigen, Urine: NEGATIVE

## 2012-12-04 MED ORDER — METOPROLOL TARTRATE 25 MG PO TABS
25.0000 mg | ORAL_TABLET | Freq: Two times a day (BID) | ORAL | Status: DC
  Administered 2012-12-04: 25 mg via ORAL
  Filled 2012-12-04 (×2): qty 1

## 2012-12-04 MED ORDER — AMOXICILLIN-POT CLAVULANATE 875-125 MG PO TABS: 1.0000 | ORAL_TABLET | Freq: Two times a day (BID) | ORAL | Status: DC

## 2012-12-04 MED ORDER — METOPROLOL TARTRATE 50 MG PO TABS
50.0000 mg | ORAL_TABLET | Freq: Two times a day (BID) | ORAL | Status: DC
  Filled 2012-12-04 (×2): qty 1

## 2012-12-04 MED ORDER — METOPROLOL TARTRATE 25 MG PO TABS: 50.0000 mg | ORAL_TABLET | Freq: Two times a day (BID) | ORAL | Status: DC

## 2012-12-04 MED ORDER — OMEPRAZOLE 40 MG PO CPDR: 40.0000 mg | DELAYED_RELEASE_CAPSULE | Freq: Every day | ORAL | Status: DC

## 2012-12-04 NOTE — ED Provider Notes (Signed)
History/physical exam/procedure(s) were performed by non-physician practitioner and as supervising physician I was immediately available for consultation/collaboration. I have reviewed all notes and am in agreement with care and plan.   [name], MD 12/04/12 [phone]

## 2012-12-04 NOTE — Progress Notes (Signed)
RT called to administer breathing treatment for pt. Pt in no obvious distress, some crackles heard in R base, but mostly clear/diminished. Pt has productive cough-thick yellow sputum. HR 63, RR 18, SpO2 93% on RA. No treatment given at this time. RT will continue to monitor.
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
## 2012-12-04 NOTE — Progress Notes (Signed)
Discharge papers reviewed and signed; tele and IV removed; 2 buses passes given; [facility] was [facility].  BARNETT, Geroge Baseman

## 2012-12-28 ENCOUNTER — Inpatient Hospital Stay: Payer: Medicare Other

## 2012-12-28 ENCOUNTER — Emergency Department (HOSPITAL_COMMUNITY): Payer: Medicare Other

## 2012-12-28 ENCOUNTER — Emergency Department (HOSPITAL_COMMUNITY)
Admission: EM | Admit: 2012-12-28 | Discharge: 2012-12-28 | Disposition: A | Discharge status: Home / Self Care | Payer: Medicare Other | Attending: Emergency Medicine | Admitting: Emergency Medicine

## 2012-12-28 ENCOUNTER — Encounter (HOSPITAL_COMMUNITY): Payer: Self-pay | Admitting: Emergency Medicine

## 2012-12-28 DIAGNOSIS — F17200 Nicotine dependence, unspecified, uncomplicated: Secondary | ICD-10-CM | POA: Insufficient documentation

## 2012-12-28 DIAGNOSIS — J4489 Other specified chronic obstructive pulmonary disease: Secondary | ICD-10-CM | POA: Insufficient documentation

## 2012-12-28 DIAGNOSIS — R05 Cough: Secondary | ICD-10-CM | POA: Insufficient documentation

## 2012-12-28 DIAGNOSIS — J449 Chronic obstructive pulmonary disease, unspecified: Secondary | ICD-10-CM | POA: Insufficient documentation

## 2012-12-28 DIAGNOSIS — IMO0002 Reserved for concepts with insufficient information to code with codable children: Secondary | ICD-10-CM | POA: Insufficient documentation

## 2012-12-28 DIAGNOSIS — S2231XS Fracture of one rib, right side, sequela: Secondary | ICD-10-CM

## 2012-12-28 DIAGNOSIS — IMO0001 Reserved for inherently not codable concepts without codable children: Secondary | ICD-10-CM | POA: Insufficient documentation

## 2012-12-28 DIAGNOSIS — R062 Wheezing: Secondary | ICD-10-CM | POA: Insufficient documentation

## 2012-12-28 DIAGNOSIS — Z880 Allergy status to penicillin: Secondary | ICD-10-CM | POA: Insufficient documentation

## 2012-12-28 DIAGNOSIS — I10 Essential (primary) hypertension: Secondary | ICD-10-CM | POA: Insufficient documentation

## 2012-12-28 DIAGNOSIS — R071 Chest pain on breathing: Secondary | ICD-10-CM | POA: Insufficient documentation

## 2012-12-28 DIAGNOSIS — R059 Cough, unspecified: Secondary | ICD-10-CM | POA: Insufficient documentation

## 2012-12-28 DIAGNOSIS — Y939 Activity, unspecified: Secondary | ICD-10-CM | POA: Insufficient documentation

## 2012-12-28 LAB — BASIC METABOLIC PANEL
BUN: 53 mg/dL — ABNORMAL HIGH (ref 6–23)
Calcium: 8.8 mg/dL (ref 8.4–10.5)
Creatinine, Ser: 0.91 mg/dL (ref 0.50–1.35)
GFR calc Af Amer: 90 mL/min — ABNORMAL LOW (ref >90)

## 2012-12-28 LAB — CBC
HCT: 30.6 % — ABNORMAL LOW (ref 39.0–52.0)
MCH: 25.8 pg — ABNORMAL LOW (ref 26.0–34.0)
MCV: 83.8 fL (ref 78.0–100.0)
Platelets: 450 10*3/uL — ABNORMAL HIGH (ref 150–400)
RDW: 14.7 % (ref 11.5–15.5)
WBC: 19 10*3/uL — ABNORMAL HIGH (ref 4.0–10.5)

## 2012-12-28 MED ORDER — ALBUTEROL SULFATE HFA 108 (90 BASE) MCG/ACT IN AERS: 2.0000 | INHALATION_SPRAY | RESPIRATORY_TRACT | Status: DC | PRN

## 2012-12-28 MED ORDER — HYDROCODONE-ACETAMINOPHEN 5-325 MG PO TABS: 1.0000 | ORAL_TABLET | ORAL | Status: DC | PRN

## 2012-12-28 MED ORDER — AZITHROMYCIN 250 MG PO TABS: 250.0000 mg | ORAL_TABLET | Freq: Every day | ORAL | Status: DC

## 2012-12-28 MED ORDER — ALBUTEROL SULFATE HFA 108 (90 BASE) MCG/ACT IN AERS
2.0000 | INHALATION_SPRAY | RESPIRATORY_TRACT | Status: DC | PRN
  Administered 2012-12-28: 2 via RESPIRATORY_TRACT
  Filled 2012-12-28: qty 13.4

## 2012-12-28 NOTE — ED Notes (Signed)
Pt refusing to sign out upon discharge.  Pt upset during discharge "i don't want to go to [facility].  i am a retired Administrator, [employer], yall should put me up in a hotel." [place] from [employer] gave pt paperwork to get prescriptions for free.  Pt inquiring as to how he is going to go home.  Bus pass given to pt.  "They [facility]int gonna take to [facility].  Why cant an ambulance take me."  Pt placed in paper scrubs and wheeled out to waiting room.

## 2012-12-28 NOTE — ED Notes (Signed)
CSW familiar with pt from previous visits.  CSW verified bed availability at [facility] and spoke with pt.  Pt stated that he would go to [facility] if we could get him there.  Pt agreeable taking the bus.  Bus pass given to pt by RN.

## 2012-12-28 NOTE — Progress Notes (Addendum)
ED CM received referral from Dr. Blinda Leatherwood to meet with patient regarding medication assistance for antibiotics. Pt presents to ED with SOB, and CP. Pt is homeless CSW consulted.  In to meet with patient he is unable to give me clear answer about health insurance. Records indicate patient has VA coverage. [facility] [address] [facility]. However, Pharmacist stated that patient does not have insurance through the [place] that cover medications. He would need to pay at 100%. Pt is homeless and states, "I do not have any money to my name".  Discussed with Dr. Blinda Leatherwood. Patient Enrolled in [facility] program co-pay waived. MATCH letter printed and given to patient with guidelines and list of pharmacies where to fill prescriptions. [facility] [address]. Verbalized understanding, used the the teach back method.  No further CM needs identified.
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
## 2013-01-01 ENCOUNTER — Encounter (HOSPITAL_COMMUNITY): Payer: Self-pay | Admitting: Emergency Medicine

## 2013-01-01 ENCOUNTER — Emergency Department (HOSPITAL_COMMUNITY)
Admission: EM | Admit: 2013-01-01 | Discharge: 2013-01-01 | Disposition: A | Discharge status: Home / Self Care | Payer: Medicare Other | Attending: Emergency Medicine | Admitting: Emergency Medicine

## 2013-01-01 DIAGNOSIS — M25529 Pain in unspecified elbow: Secondary | ICD-10-CM | POA: Insufficient documentation

## 2013-01-01 DIAGNOSIS — F17200 Nicotine dependence, unspecified, uncomplicated: Secondary | ICD-10-CM | POA: Insufficient documentation

## 2013-01-01 DIAGNOSIS — Z8701 Personal history of pneumonia (recurrent): Secondary | ICD-10-CM | POA: Insufficient documentation

## 2013-01-01 DIAGNOSIS — E119 Type 2 diabetes mellitus without complications: Secondary | ICD-10-CM | POA: Insufficient documentation

## 2013-01-01 DIAGNOSIS — Z8719 Personal history of other diseases of the digestive system: Secondary | ICD-10-CM | POA: Insufficient documentation

## 2013-01-01 DIAGNOSIS — Z862 Personal history of diseases of the blood and blood-forming organs and certain disorders involving the immune mechanism: Secondary | ICD-10-CM | POA: Insufficient documentation

## 2013-01-01 DIAGNOSIS — M546 Pain in thoracic spine: Secondary | ICD-10-CM | POA: Insufficient documentation

## 2013-01-01 DIAGNOSIS — Z5900 Homelessness unspecified: Secondary | ICD-10-CM | POA: Insufficient documentation

## 2013-01-01 DIAGNOSIS — M7918 Myalgia, other site: Secondary | ICD-10-CM

## 2013-01-01 DIAGNOSIS — Z8659 Personal history of other mental and behavioral disorders: Secondary | ICD-10-CM | POA: Insufficient documentation

## 2013-01-01 DIAGNOSIS — Z880 Allergy status to penicillin: Secondary | ICD-10-CM | POA: Insufficient documentation

## 2013-01-01 DIAGNOSIS — R079 Chest pain, unspecified: Secondary | ICD-10-CM | POA: Insufficient documentation

## 2013-01-01 DIAGNOSIS — J449 Chronic obstructive pulmonary disease, unspecified: Secondary | ICD-10-CM | POA: Insufficient documentation

## 2013-01-01 DIAGNOSIS — I10 Essential (primary) hypertension: Secondary | ICD-10-CM | POA: Insufficient documentation

## 2013-01-01 DIAGNOSIS — G8929 Other chronic pain: Secondary | ICD-10-CM | POA: Insufficient documentation

## 2013-01-01 DIAGNOSIS — J4489 Other specified chronic obstructive pulmonary disease: Secondary | ICD-10-CM | POA: Insufficient documentation

## 2013-01-01 DIAGNOSIS — Z79899 Other long term (current) drug therapy: Secondary | ICD-10-CM | POA: Insufficient documentation

## 2013-01-01 MED ORDER — HYDROCODONE-ACETAMINOPHEN 5-325 MG PO TABS
1.0000 | ORAL_TABLET | Freq: Once | ORAL | Status: AC
  Administered 2013-01-01: 1 via ORAL
  Filled 2013-01-01: qty 1
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
## 2013-01-01 NOTE — ED Notes (Signed)
Bed: WA03 Expected date:  Expected time:  Means of arrival:  Comments: EMS 79yo M, generalized pain

## 2013-01-01 NOTE — ED Notes (Signed)
Patient asking this RN for glasses because he can not see. He also states that he can not walk or stand. Threatening to throw himself onto the floor.,. Patient given food and drinkl and walked to waiting room. Patient refused to sign d/c papers

## 2013-01-04 ENCOUNTER — Encounter (HOSPITAL_COMMUNITY): Payer: Self-pay | Admitting: Emergency Medicine

## 2013-01-04 ENCOUNTER — Emergency Department (HOSPITAL_COMMUNITY)
Admission: EM | Admit: 2013-01-04 | Discharge: 2013-01-05 | Disposition: A | Discharge status: Home / Self Care | Payer: Medicare Other | Source: Home / Self Care | Attending: Emergency Medicine | Admitting: Emergency Medicine

## 2013-01-04 DIAGNOSIS — Z590 Homelessness: Secondary | ICD-10-CM

## 2013-01-04 DIAGNOSIS — M7918 Myalgia, other site: Secondary | ICD-10-CM

## 2013-01-04 HISTORY — DX: Type 2 diabetes mellitus without complications: E11.9

## 2013-01-04 LAB — COMPREHENSIVE METABOLIC PANEL
Albumin: 3.1 g/dL — ABNORMAL LOW (ref 3.5–5.2)
Alkaline Phosphatase: 149 U/L — ABNORMAL HIGH (ref 39–117)
BUN: 29 mg/dL — ABNORMAL HIGH (ref 6–23)
Chloride: 101 mEq/L (ref 96–112)
Creatinine, Ser: 0.93 mg/dL (ref 0.50–1.35)
GFR calc Af Amer: 89 mL/min — ABNORMAL LOW (ref >90)
GFR calc non Af Amer: 77 mL/min — ABNORMAL LOW (ref >90)
Glucose, Bld: 89 mg/dL (ref 70–99)
Potassium: 3.6 mEq/L (ref 3.5–5.1)
Total Bilirubin: 0.3 mg/dL (ref 0.3–1.2)
Total Protein: 7.4 g/dL (ref 6.0–8.3)

## 2013-01-04 LAB — CBC WITH DIFFERENTIAL/PLATELET
Basophils Relative: 0 % (ref 0–1)
Eosinophils Absolute: 0.2 10*3/uL (ref 0.0–0.7)
Eosinophils Relative: 1 % (ref 0–5)
HCT: 28.7 % — ABNORMAL LOW (ref 39.0–52.0)
Hemoglobin: 9 g/dL — ABNORMAL LOW (ref 13.0–17.0)
Lymphs Abs: 2.1 10*3/uL (ref 0.7–4.0)
MCH: 25.9 pg — ABNORMAL LOW (ref 26.0–34.0)
MCHC: 31.4 g/dL (ref 30.0–36.0)
MCV: 82.7 fL (ref 78.0–100.0)
Monocytes Absolute: 1.8 10*3/uL — ABNORMAL HIGH (ref 0.1–1.0)
Monocytes Relative: 10 % (ref 3–12)
Neutrophils Relative %: 76 % (ref 43–77)
RBC: 3.47 MIL/uL — ABNORMAL LOW (ref 4.22–5.81)
RDW: 14.8 % (ref 11.5–15.5)

## 2013-01-04 LAB — LIPASE, BLOOD: Lipase: 46 U/L (ref 11–59)

## 2013-01-04 MED ORDER — NICOTINE 21 MG/24HR TD PT24: 21.0000 mg | MEDICATED_PATCH | Freq: Every day | TRANSDERMAL | Status: DC

## 2013-01-04 MED ORDER — LORAZEPAM 1 MG PO TABS: 1.0000 mg | ORAL_TABLET | Freq: Three times a day (TID) | ORAL | Status: DC | PRN

## 2013-01-04 MED ORDER — ACETAMINOPHEN 325 MG PO TABS: 650.0000 mg | ORAL_TABLET | ORAL | Status: DC | PRN

## 2013-01-04 MED ORDER — ALUM & MAG HYDROXIDE-SIMETH 200-200-20 MG/5ML PO SUSP: 30.0000 mL | ORAL | Status: DC | PRN

## 2013-01-04 MED ORDER — ONDANSETRON HCL 4 MG PO TABS: 4.0000 mg | ORAL_TABLET | Freq: Three times a day (TID) | ORAL | Status: DC | PRN

## 2013-01-04 NOTE — ED Notes (Signed)
Pt reports vomiting and diarrhea
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
## 2013-01-04 NOTE — BH Assessment (Signed)
TTS consult was initially scheduled for 2130 but after TTS consulted with EDP,Dr.Pickering TTS consult was requested specifically by and Extender and Psychiatrist to determine capacity and level of care needed at this time for patient. Pt will be evaluated by Psych Extender who will provide further recommendations for care.  Glorious Peach, MS, LCASA Assessment Counselor

## 2013-01-04 NOTE — BH Assessment (Signed)
Spoke with Dr. Rubin Payor who is requesting TTS consult. Dr. Rubin Payor is concerned that patient needs a capacity evaluation. TTS does not need to evaluate patient at this time as capacity evaluations are done by psychiatrist but Dr. Rubin Payor is requesting that extender evaluate patient for recommendations for treatment.   Spoke with Donell Sievert and informed him that patient needs a Tele-Psych consult.     Glorious Peach, MS, LCASA Assessment Counselor

## 2013-01-04 NOTE — ED Notes (Signed)
Patient currently talking with tele psych.

## 2013-01-04 NOTE — Progress Notes (Signed)
ED CM received consult from [facility].  Pt presented to ED pain, he has 7 ED visits in 6months. Pt is homeless and was in [facility] ED last with week with similar complaints. Attempted to contact on-call CSW.Marland Kitchen Provided patient with  [facility] List. With Address to [employer].  Discussed with H. Merrell PA-C. Pt does not have any RNCM needs.
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
## 2013-01-05 ENCOUNTER — Emergency Department (HOSPITAL_COMMUNITY): Payer: Medicare Other

## 2013-01-05 LAB — URINALYSIS, ROUTINE W REFLEX MICROSCOPIC
Ketones, ur: 15 mg/dL — AB
Leukocytes, UA: NEGATIVE
Nitrite: NEGATIVE
Protein, ur: NEGATIVE mg/dL
Urobilinogen, UA: 1 mg/dL (ref 0.0–1.0)

## 2013-01-05 MED ORDER — LIDOCAINE HCL 2 % EX GEL
Freq: Once | CUTANEOUS | Status: AC
  Administered 2013-01-05: 20 via URETHRAL
  Filled 2013-01-05: qty 20

## 2013-01-05 NOTE — Progress Notes (Signed)
ED CM contacted  CSW in regards to a possible disposition plan. CSW will follow up.

## 2013-01-05 NOTE — ED Notes (Signed)
TELEPSYCH IN PROGRESS. PT STILL NEEDS TO SEE A PSYCHIATRIST

## 2013-01-05 NOTE — ED Notes (Signed)
Patient wants the hospital to help him get an apartment. He states that he deserves an apartment sice he was in the [employer] for 11 years. We are currently waiting for social work to get down here and offer referrals for patient

## 2013-01-05 NOTE — Progress Notes (Addendum)
Discussed plan of care with Becky patients RN,await TSS.

## 2013-01-05 NOTE — ED Provider Notes (Signed)
Discussed with Elson Clan and Dr. Shela Commons. Patient does not have any suicidal or homicidal thoughts. He is alert and oriented x3. He denies any physical complaints. He states he is homeless. He is not meet criteria for inpatient psychiatric hospitalization. Case manager gave patient resources yesterday BP 127/81  Pulse 77  Temp(Src) 98.7 F (37.1 C) (Oral)  Resp 18  SpO2 99%   Glynn Octave, MD 01/05/13 1451

## 2013-01-05 NOTE — ED Provider Notes (Signed)
Dale Mcfarland S 8:00 PM patient discussed in sign out. Patient with multiple symptoms however also having depression and some concerns for hallucinations. TTS consult ordered.  Spoke with TTS.  They will see patient and evaluate for possible further psychiatric evaluation or placement. Psychiatric old and orders in place.  Angus Seller, PA-C 01/05/13 [phone]

## 2013-01-05 NOTE — ED Notes (Signed)
Dr Manus Gunning has been in to talk with patient. He has [facility] with [facility] mashburn at [facility] They plan to discharge pt home

## 2013-01-05 NOTE — BH Assessment (Addendum)
Received a call this am from EDP-Dr. Rancour requesting a Telepsych to determine capacity. Writer completed the tele assessment first then asked extender-Neil to evaluate this patient via telepsych for capacity. Lloyd Huger sts that patient does not need a telepsych to determine capacity. Lloyd Huger verbally told this to EDP as well. Lloyd Huger sts she suggested that patient is discharged from the ED from the ED. Apparently, EDP agreed with verbal recommendations to determine capacity. Patient will be discharged from ED.

## 2013-01-05 NOTE — Clinical Social Work Note (Signed)
Clinical Child psychotherapist met with patient at bedside who continues to reiterate that "I am entitled to free housing for 11 years under an Libr[employer], academic for being a veteran - you need to look it up."  Patient states that he receives over $1500/month and spends as he wishes.  Patient does not express further interest in paying for housing.  CSW continued to offer patient additional resources, he refuses.  Patient cleared for discharge with appropriate resources available from CM yesterday.  Bus pass provided.  Clinical Social Worker will sign off for now as social work intervention is no longer needed. Please consult [place] again if new need arises.  [name], [place] [phone]

## 2013-01-05 NOTE — Consult Note (Signed)
This case was reviewed for possible Tele-psych. He does not meet criteria for admission and the majority of his issues are psycho-social and should be addressed by the Child psychotherapist for that unit.   This information was called to Dr. Lajean Saver who was going to see the patient for the first time as he was just new to the shift, to see if there were other concerns from a psychiatric stand point. He will contact us if there are any questions.  Rona Ravens. Mashburn RPAC 2:48 PM 01/05/2013  Reviewed the information documented and agree with the treatment plan.  Belky Mundo,JANARDHAHA R. 01/11/2013 8:55 AM
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
## 2013-01-05 NOTE — BH Assessment (Signed)
Writer contacted Dr. Manus Gunning prior to seeing this patient for clinicals. Dr. Manus Gunning sts he is currently with a critical patient and asked this writer to read the chart. Writer reviewed the H&P.

## 2013-01-06 ENCOUNTER — Encounter (HOSPITAL_COMMUNITY): Payer: Self-pay | Admitting: Emergency Medicine

## 2013-01-06 ENCOUNTER — Inpatient Hospital Stay (HOSPITAL_COMMUNITY)
Admission: EM | Admit: 2013-01-06 | Discharge: 2013-01-07 | DRG: 379 | Disposition: A | Discharge status: Home / Self Care | Payer: Medicare Other | Attending: Internal Medicine | Admitting: Internal Medicine

## 2013-01-06 ENCOUNTER — Emergency Department (HOSPITAL_COMMUNITY): Payer: Medicare Other

## 2013-01-06 DIAGNOSIS — Z888 Allergy status to other drugs, medicaments and biological substances status: Secondary | ICD-10-CM

## 2013-01-06 DIAGNOSIS — E119 Type 2 diabetes mellitus without complications: Secondary | ICD-10-CM | POA: Diagnosis present

## 2013-01-06 DIAGNOSIS — I509 Heart failure, unspecified: Secondary | ICD-10-CM

## 2013-01-06 DIAGNOSIS — I10 Essential (primary) hypertension: Secondary | ICD-10-CM | POA: Diagnosis present

## 2013-01-06 DIAGNOSIS — K921 Melena: Secondary | ICD-10-CM | POA: Diagnosis present

## 2013-01-06 DIAGNOSIS — J4489 Other specified chronic obstructive pulmonary disease: Secondary | ICD-10-CM | POA: Diagnosis present

## 2013-01-06 DIAGNOSIS — F3289 Other specified depressive episodes: Secondary | ICD-10-CM | POA: Diagnosis present

## 2013-01-06 DIAGNOSIS — G8929 Other chronic pain: Secondary | ICD-10-CM | POA: Diagnosis present

## 2013-01-06 DIAGNOSIS — M549 Dorsalgia, unspecified: Secondary | ICD-10-CM | POA: Diagnosis present

## 2013-01-06 DIAGNOSIS — K922 Gastrointestinal hemorrhage, unspecified: Principal | ICD-10-CM

## 2013-01-06 DIAGNOSIS — J449 Chronic obstructive pulmonary disease, unspecified: Secondary | ICD-10-CM

## 2013-01-06 DIAGNOSIS — Z5900 Homelessness unspecified: Secondary | ICD-10-CM

## 2013-01-06 DIAGNOSIS — D500 Iron deficiency anemia secondary to blood loss (chronic): Secondary | ICD-10-CM | POA: Diagnosis present

## 2013-01-06 DIAGNOSIS — D649 Anemia, unspecified: Secondary | ICD-10-CM

## 2013-01-06 DIAGNOSIS — R0602 Shortness of breath: Secondary | ICD-10-CM

## 2013-01-06 DIAGNOSIS — Z720 Tobacco use: Secondary | ICD-10-CM | POA: Diagnosis present

## 2013-01-06 DIAGNOSIS — K2100 Gastro-esophageal reflux disease with esophagitis, without bleeding: Secondary | ICD-10-CM

## 2013-01-06 DIAGNOSIS — R195 Other fecal abnormalities: Secondary | ICD-10-CM

## 2013-01-06 DIAGNOSIS — F17200 Nicotine dependence, unspecified, uncomplicated: Secondary | ICD-10-CM

## 2013-01-06 DIAGNOSIS — Z880 Allergy status to penicillin: Secondary | ICD-10-CM

## 2013-01-06 DIAGNOSIS — F1020 Alcohol dependence, uncomplicated: Secondary | ICD-10-CM

## 2013-01-06 DIAGNOSIS — D509 Iron deficiency anemia, unspecified: Secondary | ICD-10-CM | POA: Diagnosis present

## 2013-01-06 DIAGNOSIS — F329 Major depressive disorder, single episode, unspecified: Secondary | ICD-10-CM | POA: Diagnosis present

## 2013-01-06 LAB — BASIC METABOLIC PANEL
BUN: 31 mg/dL — ABNORMAL HIGH (ref 6–23)
Creatinine, Ser: 0.91 mg/dL (ref 0.50–1.35)
GFR calc Af Amer: 90 mL/min — ABNORMAL LOW (ref >90)
GFR calc non Af Amer: 77 mL/min — ABNORMAL LOW (ref >90)
Glucose, Bld: 187 mg/dL — ABNORMAL HIGH (ref 70–99)
Potassium: 3.8 mEq/L (ref 3.5–5.1)

## 2013-01-06 LAB — RAPID URINE DRUG SCREEN, HOSP PERFORMED
Amphetamines: NOT DETECTED
Barbiturates: NOT DETECTED
Benzodiazepines: NOT DETECTED
Cocaine: NOT DETECTED
Tetrahydrocannabinol: NOT DETECTED

## 2013-01-06 LAB — CBC WITH DIFFERENTIAL/PLATELET
Basophils Absolute: 0.1 10*3/uL (ref 0.0–0.1)
Basophils Relative: 1 % (ref 0–1)
Eosinophils Absolute: 0.6 10*3/uL (ref 0.0–0.7)
Eosinophils Relative: 5 % (ref 0–5)
HCT: 25.5 % — ABNORMAL LOW (ref 39.0–52.0)
Hemoglobin: 8 g/dL — ABNORMAL LOW (ref 13.0–17.0)
Lymphs Abs: 1.9 10*3/uL (ref 0.7–4.0)
MCH: 25.6 pg — ABNORMAL LOW (ref 26.0–34.0)
MCHC: 31.4 g/dL (ref 30.0–36.0)
MCV: 81.5 fL (ref 78.0–100.0)
Monocytes Absolute: 0.9 10*3/uL (ref 0.1–1.0)
Monocytes Relative: 9 % (ref 3–12)
Neutrophils Relative %: 66 % (ref 43–77)
RDW: 14.8 % (ref 11.5–15.5)

## 2013-01-06 LAB — PREPARE RBC (CROSSMATCH)

## 2013-01-06 LAB — OCCULT BLOOD, POC DEVICE: Fecal Occult Bld: POSITIVE — AB

## 2013-01-06 LAB — HEPATITIS PANEL, ACUTE
HCV Ab: NEGATIVE
Hep B C IgM: NONREACTIVE
Hepatitis B Surface Ag: NEGATIVE

## 2013-01-06 LAB — CBC
HCT: 22.9 % — ABNORMAL LOW (ref 39.0–52.0)
Hemoglobin: 7.1 g/dL — ABNORMAL LOW (ref 13.0–17.0)
MCH: 25.4 pg — ABNORMAL LOW (ref 26.0–34.0)
MCHC: 31 g/dL (ref 30.0–36.0)
RBC: 2.8 MIL/uL — ABNORMAL LOW (ref 4.22–5.81)

## 2013-01-06 MED ORDER — IPRATROPIUM BROMIDE HFA 17 MCG/ACT IN AERS
2.0000 | INHALATION_SPRAY | Freq: Four times a day (QID) | RESPIRATORY_TRACT | Status: DC
  Administered 2013-01-06 – 2013-01-07 (×4): 2 via RESPIRATORY_TRACT
  Filled 2013-01-06: qty 12.9

## 2013-01-06 MED ORDER — SODIUM CHLORIDE 0.9 % IV BOLUS (SEPSIS)
1000.0000 mL | Freq: Once | INTRAVENOUS | Status: AC
  Administered 2013-01-06: 1000 mL via INTRAVENOUS

## 2013-01-06 MED ORDER — ACETAMINOPHEN 650 MG RE SUPP: 650.0000 mg | Freq: Four times a day (QID) | RECTAL | Status: DC | PRN

## 2013-01-06 MED ORDER — HYDROCORTISONE ACETATE 25 MG RE SUPP
25.0000 mg | Freq: Once | RECTAL | Status: AC
  Administered 2013-01-06: 16:00:00 25 mg via RECTAL
  Filled 2013-01-06: qty 1

## 2013-01-06 MED ORDER — SODIUM CHLORIDE 0.9 % IV SOLN
INTRAVENOUS | Status: DC
  Administered 2013-01-06: 22:00:00 via INTRAVENOUS

## 2013-01-06 MED ORDER — SODIUM CHLORIDE 0.9 % IV SOLN
INTRAVENOUS | Status: DC
  Administered 2013-01-06: 11:00:00 via INTRAVENOUS

## 2013-01-06 MED ORDER — OXYCODONE HCL 5 MG PO TABS
5.0000 mg | ORAL_TABLET | ORAL | Status: DC | PRN
  Administered 2013-01-06: 5 mg via ORAL
  Filled 2013-01-06: qty 1

## 2013-01-06 MED ORDER — ACETAMINOPHEN 325 MG PO TABS
650.0000 mg | ORAL_TABLET | Freq: Four times a day (QID) | ORAL | Status: DC | PRN
  Administered 2013-01-06 – 2013-01-07 (×2): 650 mg via ORAL
  Filled 2013-01-06 (×2): qty 2

## 2013-01-06 MED ORDER — PANTOPRAZOLE SODIUM 40 MG PO TBEC
40.0000 mg | DELAYED_RELEASE_TABLET | Freq: Two times a day (BID) | ORAL | Status: DC
  Administered 2013-01-07: 40 mg via ORAL
  Filled 2013-01-06 (×3): qty 1

## 2013-01-06 MED ORDER — PANTOPRAZOLE SODIUM 40 MG IV SOLR
40.0000 mg | Freq: Two times a day (BID) | INTRAVENOUS | Status: DC
  Administered 2013-01-06: 15:00:00 40 mg via INTRAVENOUS
  Filled 2013-01-06 (×2): qty 40

## 2013-01-06 MED ORDER — FUROSEMIDE 10 MG/ML IJ SOLN
20.0000 mg | Freq: Once | INTRAMUSCULAR | Status: AC
  Administered 2013-01-06: 18:00:00 20 mg via INTRAVENOUS
  Filled 2013-01-06: qty 2

## 2013-01-06 NOTE — ED Notes (Signed)
PT CBG: 65

## 2013-01-06 NOTE — Progress Notes (Signed)
    CARE MANAGEMENT ED NOTE 01/06/2013  Patient:  Dale Mcfarland, Dale Mcfarland   Account Number:  [id number]  Date Initiated:  01/06/2013  Documentation initiated by:  Edd Arbour  Subjective/Objective Assessment:   79 yr old medicare pt - Homeless In last 6 months 2 admissions and 8 ED visits EPIC notes indicates ED visits generally at [facility] Pt received medication assistance from [facility] CM on 12/28/12     Subjective/Objective Assessment Detail:   Pt has been seen by ED SW Pt continued to tell CM that he was a veteran     Action/Plan:   [facility] ED CM reviewed notes in [facility] Spoke with pt about being able to assist with medications since EPIC indicates medicare assistance CM provided written resources see below note   Action/Plan Detail:   Anticipated DC Date:  01/06/2013     Status Recommendation to Physician:   Result of Recommendation:    Other ED Services  Consult Working Plan   In-house referral  Clinical Social Worker   DC Associate Professor  Other  PCP issues  Outpatient Services - Pt will follow up    Choice offered to / List presented to:            Status of service:  Completed, signed off  ED Comments:   ED Comments Detail:  CM discussed and provided written information for Medicare pcps within zip code [address] as listed in EPIC, importance of pcp for f/u care, www.needymeds.org, discounted pharmacies and other [employer] such as financial assistance, DSS and  health department  Reviewed resources for [employer] self pay pcps like [facility]ov[facility] Kussmaul, family medicine at [address], [facility] family practice, general medical clinics, [facility] urgent care plus others, [facility] out patient pharmacies and housing Pt requested CM to leave his room CM provided him the written resources including medicare family providers and left his room ED RN present to also re enforce resources offered with encouragement for pt to follow up on resources offered CM discussed the differences in ED MD services and  pcp services after pt informed CM he has seen a doctor "eight times" and "I come to the emergency to see doctors" Pt states his issues are "due to Obamacare" Pt states he could not call the [facility] CM offered him the ED phone in his room Pt states he does not know his VA percentage of coverage and does not have a contact person at the [place] Reports he went to the [place] and "they did not have anything for me" He informed CM "I spoke with a bunch of Black people and they did not know what they were talking about"  Pt did not refused resources offered

## 2013-01-06 NOTE — ED Notes (Signed)
Pt removed Left AC IV. New IV inserted to Right AC, CBC collected from new site.

## 2013-01-06 NOTE — Care Management Note (Signed)
    Page 1 of 1   01/06/2013     1:40:45 PM   CARE MANAGEMENT NOTE 01/06/2013  Patient:  Dale Mcfarland, Dale Mcfarland   Account Number:  [id number]  Date Initiated:  01/06/2013  Documentation initiated by:  Lanier Clam  Subjective/Objective Assessment:   79 Y/O M ADMITTED W/GIB.HX:CHF,COPD.     Action/Plan:   SHELTER/HOMELESS.   Anticipated DC Date:  01/11/2013   Anticipated DC Plan:  HOME/SELF CARE      DC Planning Services  CM consult      Choice offered to / List presented to:             Status of service:  In process, will continue to follow Medicare Important Message given?   (If response is "NO", the following Medicare IM given date fields will be blank) Date Medicare IM given:   Date Additional Medicare IM given:    Discharge Disposition:    Per UR Regulation:  Reviewed for med. necessity/level of care/duration of stay  If discussed at Long Length of Stay Meetings, dates discussed:    Comments:  01/06/13 Myrian Botello RN,BSN NCM [phone] SEE ED CM NOTE-ALREADY PROVIDED W/RESOURCES.
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
## 2013-01-06 NOTE — ED Notes (Signed)
EDP, Freida Busman made aware of positive occult blood stool.
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
## 2013-01-06 NOTE — ED Notes (Signed)
EDP, Allen notified of pt hemoglobin of 7.1.
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
## 2013-01-06 NOTE — ED Notes (Signed)
Gerilyn Pilgrim L at bedside while POCT occult blood stool collected.

## 2013-01-06 NOTE — ED Notes (Signed)
Social worker at bedside.
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
## 2013-01-06 NOTE — ED Notes (Signed)
Bed: WA20 Expected date:  Expected time:  Means of arrival:  Comments: SOB 
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
## 2013-01-07 LAB — CBC WITH DIFFERENTIAL/PLATELET
Basophils Absolute: 0.1 10*3/uL (ref 0.0–0.1)
Basophils Relative: 1 % (ref 0–1)
MCHC: 32.2 g/dL (ref 30.0–36.0)
Neutro Abs: 6.8 10*3/uL (ref 1.7–7.7)
Neutrophils Relative %: 61 % (ref 43–77)
Platelets: 388 10*3/uL (ref 150–400)
RDW: 14.8 % (ref 11.5–15.5)

## 2013-01-07 LAB — COMPREHENSIVE METABOLIC PANEL
ALT: 9 U/L (ref 0–53)
AST: 14 U/L (ref 0–37)
Alkaline Phosphatase: 136 U/L — ABNORMAL HIGH (ref 39–117)
CO2: 21 mEq/L (ref 19–32)
Chloride: 103 mEq/L (ref 96–112)
GFR calc Af Amer: 87 mL/min — ABNORMAL LOW (ref >90)
GFR calc non Af Amer: 75 mL/min — ABNORMAL LOW (ref >90)
Glucose, Bld: 94 mg/dL (ref 70–99)
Potassium: 4 mEq/L (ref 3.5–5.1)
Sodium: 135 mEq/L (ref 135–145)
Total Bilirubin: 0.3 mg/dL (ref 0.3–1.2)

## 2013-01-07 LAB — TYPE AND SCREEN
ABO/RH(D): O NEG
Antibody Screen: NEGATIVE
Unit division: 0
Unit division: 0

## 2013-01-07 LAB — TROPONIN I: Troponin I: <0.3 ng/mL (ref <0.30)

## 2013-01-07 MED ORDER — IPRATROPIUM BROMIDE HFA 17 MCG/ACT IN AERS: 2.0000 | INHALATION_SPRAY | Freq: Four times a day (QID) | RESPIRATORY_TRACT | Status: DC

## 2013-01-07 MED ORDER — OMEPRAZOLE 40 MG PO CPDR: 40.0000 mg | DELAYED_RELEASE_CAPSULE | Freq: Two times a day (BID) | ORAL | Status: DC

## 2013-01-07 NOTE — Progress Notes (Signed)
Pt still refusing to allow RN to place IV. Explained to pt why he needed IV fluids. Pt stated he would let RN restart IV later.

## 2013-01-07 NOTE — Progress Notes (Signed)
Pt given AVS packet at discharge. All information was reviewed with pt. However,  pt refused to sign paperwork stating, "I won't sign that. I never sign that. There is wrong information in there." . Pt stated he was not going to fill Rx, and did not understand why he was not provided with free housing and free medication. Pt was again encouraged to have Rx filled, so he would not have to return to hospital. Pt was provided with bus pass at d/c and wheeled out.
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
## 2013-01-07 NOTE — Progress Notes (Signed)
Pt had 6 beat run of VTach, pt is asleep. Will continue to monitor.

## 2013-01-07 NOTE — Progress Notes (Signed)
Pt has refused to allow RN to restart IV at this time.  Explained to pt why he needed to receive IV fluids and pt repeatedly declines to have IV restarted.  Pt stated he will let nursing staff restart IV at a later time. Maeola Harman

## 2013-01-07 NOTE — ED Provider Notes (Signed)
Medical screening examination/treatment/procedure(s) were performed by non-physician practitioner and as supervising physician I was immediately available for consultation/collaboration.  EKG Interpretation   None        Juliet Rude. Rubin Payor, MD 01/07/13 1057

## 2013-01-07 NOTE — ED Provider Notes (Signed)
Medical screening examination/treatment/procedure(s) were performed by non-physician practitioner and as supervising physician I was immediately available for consultation/collaboration.  EKG Interpretation   None        Juliet Rude. Rubin Payor, MD 01/07/13 1058

## 2013-02-19 ENCOUNTER — Encounter (HOSPITAL_COMMUNITY): Payer: Self-pay | Admitting: Emergency Medicine

## 2013-02-19 ENCOUNTER — Emergency Department (HOSPITAL_COMMUNITY): Payer: Medicare Other

## 2013-02-19 ENCOUNTER — Emergency Department (HOSPITAL_COMMUNITY)
Admission: EM | Admit: 2013-02-19 | Discharge: 2013-02-19 | Disposition: A | Discharge status: Home / Self Care | Payer: Medicare Other | Attending: Emergency Medicine | Admitting: Emergency Medicine

## 2013-02-19 DIAGNOSIS — Z862 Personal history of diseases of the blood and blood-forming organs and certain disorders involving the immune mechanism: Secondary | ICD-10-CM | POA: Insufficient documentation

## 2013-02-19 DIAGNOSIS — J441 Chronic obstructive pulmonary disease with (acute) exacerbation: Secondary | ICD-10-CM | POA: Insufficient documentation

## 2013-02-19 DIAGNOSIS — F17200 Nicotine dependence, unspecified, uncomplicated: Secondary | ICD-10-CM | POA: Insufficient documentation

## 2013-02-19 DIAGNOSIS — Z5900 Homelessness unspecified: Secondary | ICD-10-CM | POA: Insufficient documentation

## 2013-02-19 DIAGNOSIS — J40 Bronchitis, not specified as acute or chronic: Secondary | ICD-10-CM

## 2013-02-19 DIAGNOSIS — Z8701 Personal history of pneumonia (recurrent): Secondary | ICD-10-CM | POA: Insufficient documentation

## 2013-02-19 DIAGNOSIS — Z8739 Personal history of other diseases of the musculoskeletal system and connective tissue: Secondary | ICD-10-CM | POA: Insufficient documentation

## 2013-02-19 DIAGNOSIS — I10 Essential (primary) hypertension: Secondary | ICD-10-CM | POA: Insufficient documentation

## 2013-02-19 DIAGNOSIS — Z9089 Acquired absence of other organs: Secondary | ICD-10-CM | POA: Insufficient documentation

## 2013-02-19 DIAGNOSIS — R079 Chest pain, unspecified: Secondary | ICD-10-CM | POA: Insufficient documentation

## 2013-02-19 DIAGNOSIS — E119 Type 2 diabetes mellitus without complications: Secondary | ICD-10-CM | POA: Insufficient documentation

## 2013-02-19 DIAGNOSIS — Z79899 Other long term (current) drug therapy: Secondary | ICD-10-CM | POA: Insufficient documentation

## 2013-02-19 DIAGNOSIS — Z880 Allergy status to penicillin: Secondary | ICD-10-CM | POA: Insufficient documentation

## 2013-02-19 DIAGNOSIS — Z8659 Personal history of other mental and behavioral disorders: Secondary | ICD-10-CM | POA: Insufficient documentation

## 2013-02-19 MED ORDER — ALBUTEROL SULFATE HFA 108 (90 BASE) MCG/ACT IN AERS
2.0000 | INHALATION_SPRAY | Freq: Once | RESPIRATORY_TRACT | Status: AC
  Administered 2013-02-19: 2 via RESPIRATORY_TRACT
  Filled 2013-02-19: qty 6.7

## 2013-02-19 MED ORDER — IPRATROPIUM BROMIDE 0.02 % IN SOLN
0.5000 mg | Freq: Once | RESPIRATORY_TRACT | Status: AC
  Administered 2013-02-19: 0.5 mg via RESPIRATORY_TRACT
  Filled 2013-02-19: qty 2.5

## 2013-02-19 MED ORDER — ALBUTEROL SULFATE (2.5 MG/3ML) 0.083% IN NEBU
5.0000 mg | INHALATION_SOLUTION | Freq: Once | RESPIRATORY_TRACT | Status: AC
  Administered 2013-02-19: 5 mg via RESPIRATORY_TRACT
  Filled 2013-02-19: qty 6

## 2013-02-19 MED ORDER — PREDNISONE 20 MG PO TABS: 40.0000 mg | ORAL_TABLET | Freq: Every day | ORAL | Status: DC

## 2013-02-19 MED ORDER — PREDNISONE 20 MG PO TABS
60.0000 mg | ORAL_TABLET | Freq: Once | ORAL | Status: AC
  Administered 2013-02-19: 60 mg via ORAL
  Filled 2013-02-19: qty 3

## 2013-02-19 NOTE — ED Provider Notes (Signed)
CSN: [id number]     Arrival date & time 02/19/13  1017 History  This chart was scribed for non-physician practitioner, Renold Genta, PA-C, working with Orpah Greek, MD by Roe Coombs, ED Scribe. This patient was seen in room TR07C/TR07C and the patient's care was started at 10:38 AM.    Chief Complaint  Patient presents with  . URI    The history is provided by the patient. No language interpreter was used.    HPI Comments: Dale Mcfarland is a 80 y.o. male who presents to the Emergency Department complaining of moderate, constant shortness of breath and constant productive cough onset 2-3 days ago. There is associated constant chest pain. He has not taken any medicine. He has used inhalers in the past, but reports that he ran out. Patient has a medical history of COPD, HTN, upper GI bleed, HTN, and DM. He is a current smoker. Patient is homeless. He stayed in a shelter the night before last, but not last night.   Past Medical History  Diagnosis Date  . COPD (chronic obstructive pulmonary disease)   . Chronic back pain   . Pneumonia     01/2011 - CAP vs aspiration pneuonmia  . Depression   . PONV (postoperative nausea and vomiting)   . Hyponatremia     Previously felt secondary to SIADH  . Hypertension   . Upper GI bleed     01/2011 with EGD showing severe candida esophagitis and duodenal bulb erosion  . Anemia     In the setting of UGI bleed 01/2011 requiring blood transfusion  . Homelessness   . Dyspnea     Chronic, thought due to COPD. Echo 02/2010 with EF 30-35%, nuclear study negative, then repeat echo 03/2011 did not show systolic dysfxn, so unclear if truly HF  . Bronchitis   . Diabetes mellitus without complication    Past Surgical History  Procedure Laterality Date  . Tonsillectomy    . Vasectomy    . Appendectomy    . Tonsillectomy    . Colonoscopy    . Esophagogastroduodenoscopy    . Esophagogastroduodenoscopy  02/03/2011    Procedure:  ESOPHAGOGASTRODUODENOSCOPY (EGD);  Surgeon: Juanita Craver, MD;  Location: [facility];  Service: Endoscopy;  Laterality: N/A;  . Esophagogastroduodenoscopy  02/03/2011    Procedure: ESOPHAGOGASTRODUODENOSCOPY (EGD);  Surgeon: Juanita Craver, MD;  Location: [facility];  Service: Endoscopy;  Laterality: N/A;  . Colonoscopy  01/30/2012    Procedure: COLONOSCOPY;  Surgeon: Ladene Artist, MD,FACG;  Location: [facility];  Service: Endoscopy;  Laterality: N/A;   Family History  Problem Relation Age of Onset  . Coronary artery disease Father     Starting in his 38's. Died of massive MI at age 40.  . Schizophrenia Brother   . Coronary artery disease Sister     MI at age 74  . Alzheimer's disease Mother    History  Substance Use Topics  . Smoking status: Current Some Day Smoker -- 1.00 packs/day for 60 years    Types: Cigarettes  . Smokeless tobacco: Never Used     Comment: Since age 68  . Alcohol Use: Yes     Comment: 174 ml of scotch weekly    Review of Systems  Constitutional: Negative for fever.  Respiratory: Positive for cough and shortness of breath.   Cardiovascular: Positive for chest pain.  All other systems reviewed and are negative.    Allergies  Benadryl and Penicillins  Home Medications   Current  Outpatient Rx  Name  Route  Sig  Dispense  Refill  . ipratropium (ATROVENT HFA) 17 MCG/ACT inhaler   Inhalation   Inhale 2 puffs into the lungs every 6 (six) hours.   1 Inhaler   12   . omeprazole (PRILOSEC) 40 MG capsule   Oral   Take 1 capsule (40 mg total) by mouth 2 (two) times daily.   60 capsule   0    Triage Vitals: BP 147/78  Pulse 73  Temp(Src) 97.9 F (36.6 C) (Oral)  Resp 19  Wt 132 lb (59.875 kg)  SpO2 97% Physical Exam  Nursing note and vitals reviewed. Constitutional: He is oriented to person, place, and time. He appears well-developed and well-nourished. No distress.  HENT:  Head: Normocephalic and atraumatic.  Eyes: EOM are normal.  Neck:  Neck supple. No tracheal deviation present.  Cardiovascular: Normal rate, regular rhythm and normal heart sounds.   Pulmonary/Chest: Effort normal. No respiratory distress. He has wheezes (inspiratory and expiratory bilaterally).  Musculoskeletal: Normal range of motion.  Neurological: He is alert and oriented to person, place, and time.  Skin: Skin is warm and dry.  Psychiatric: He has a normal mood and affect. His behavior is normal.    ED Course  Procedures (including critical care time) DIAGNOSTIC STUDIES: Oxygen Saturation is 97% on room air, adequate by my interpretation.    COORDINATION OF CARE: 10:44 AM- Patient informed of current plan for treatment and evaluation and agrees with plan at this time.    Imaging Review Dg Chest 2 View  02/19/2013   CLINICAL DATA:  URI for 2 days.  EXAM: CHEST  2 VIEW  COMPARISON:  01/06/2013 and 01/28/2012  FINDINGS: Lungs are adequately inflated with subtle peripheral interstitial changes over the right base unchanged. There is no focal consolidation or effusion. There is mild motion on lateral film. There is borderline cardiomegaly. There is calcified plaque over the aortic arch. Remainder the exam is unchanged.  IMPRESSION: No definite acute cardiopulmonary disease. Mild chronic stable interstitial changes over the lateral right base.  Mild stable cardiomegaly.   Electronically Signed   By: Marin Olp M.D.   On: 02/19/2013 11:35    EKG Interpretation   None       MDM   1. Bronchitis     In patients with history of COPD, is still a smoker, homeless, here with cough and shortness of breath. He has history of pneumonia. On initial evaluation he is in no acute distress. Does not appear to be in any respiratory distress. He is afebrile. He is joking, very talkative, and does not appear to be in any pain. Chest x-ray obtained and is consistent with COPD. Have given them several breathing treatments for his wheezing. Patient states that his  symptoms mildly improved. He was also given 60 mg of prednisone. He'll discharge him home with inhaler and prednisone for 5 more days. Patient requested a hot meal on discharge. Have explained to him that we do not have hot meals but we do have a sandwich that we can warm up for him. Patient refused a sandwich. Patient became fussy, he was discharged with security by side. He was given a bus take it. He was given a Warden/ranger.  Filed Vitals:   02/19/13 1017 02/19/13 1244  BP: 147/78 136/69  Pulse: 73 80  Temp: 97.9 F (36.6 C) 97.1 F (36.2 C)  TempSrc: Oral Oral  Resp: 19 18  Weight: 132 lb (59.875 kg)  SpO2: 97% 97%     I personally performed the services described in this documentation, which was scribed in my presence. The recorded information has been reviewed and is accurate.    Renold Genta, PA-C 02/19/13 1308

## 2013-02-19 NOTE — ED Notes (Signed)
Patient requested something to eat.  I offered [place] sandwich, crackers, soda.  Patient declined stating he wants a real lunch.    Patient advised cafeteria downstairs, he advised "i have money, i like their food.  I will go there".

## 2013-02-19 NOTE — Discharge Instructions (Signed)
Take inhaler 2 puffs every 4 hrs. Prednisone as prescribed until all gone.     Chronic Obstructive Pulmonary Disease Chronic obstructive pulmonary disease (COPD) is a condition in which airflow from the lungs is restricted. The lungs can never return to normal, but there are measures you can take which will improve them and make you feel better. CAUSES   Smoking.  Exposure to secondhand smoke. Breathing in irritants such as air pollution, dust, cigarette smoke, strBronchitis Bronchitis is the body's way of reacting to injury and/or infection (inflammation) of the bronchi. Bronchi are the air tubes that extend from the windpipe into the lungs. If the inflammation becomes severe, it may cause shortness of breath. CAUSES  Inflammation may be caused by: A virus. Germs (bacteria). Dust. Allergens. Pollutants and many other irritants. The cells lining the bronchial tree are covered with tiny hairs (cilia). These constantly beat upward, away from the lungs, toward the mouth. This keeps the lungs free of pollutants. When these cells become too irritated and are unable to do their job, mucus begins to develop. This causes the characteristic cough of bronchitis. The cough clears the lungs when the cilia are unable to do their job. Without either of these protective mechanisms, the mucus would settle in the lungs. Then you would develop pneumonia. Smoking is a common cause of bronchitis and can contribute to pneumonia. Stopping this habit is the single most important thing you can do to help yourself. TREATMENT  Your caregiver may prescribe an antibiotic if the cough is caused by bacteria. Also, medicines that open up your airways make it easier to breathe. Your caregiver may also recommend or prescribe an expectorant. It will loosen the mucus to be coughed up. Only take over-the-counter or prescription medicines for pain, discomfort, or fever as directed by your caregiver. Removing whatever causes the  problem (smoking, for example) is critical to preventing the problem from getting worse. Cough suppressants may be prescribed for relief of cough symptoms. Inhaled medicines may be prescribed to help with symptoms now and to help prevent problems from returning. For those with recurrent (chronic) bronchitis, there may be a need for steroid medicines. SEEK IMMEDIATE MEDICAL CARE IF:  During treatment, you develop more pus-like mucus (purulent sputum). You have a fever. You become progressively more ill. You have increased difficulty breathing, wheezing, or shortness of breath. It is necessary to seek immediate medical care if you are elderly or sick from any other disease. MAKE SURE YOU:  Understand these instructions. Will watch your condition. Will get help right away if you are not doing well or get worse. Document Released: 01/28/2005 Document Revised: 09/30/2012 Document Reviewed: 09/22/2012 [facility] Patient Information 2014 [place], [place].  ong odors, aerosol sprays, or paint fumes.  History of lung infections. SYMPTOMS   Deep, persistent (chronic) cough with a large amount of thick mucus.  Wheezing.  Shortness of breath, especially with physical activity.  Feeling like you cannot get enough air.  Difficulty breathing.  Rapid breaths (tachypnea).  Gray or bluish discoloration (cyanosis) of the skin, especially in fingers, toes, or lips.  Fatigue.  Weight loss.  Swelling in legs, ankles, or feet.  Fast heartbeat (tachycardia).  Frequent lung infections.   Chest tightness. DIAGNOSIS  Initial diagnosis may be based on your history, symptoms, and physical examination. Additional tests for COPD may include:  Chest X-ray.  Computed tomography (CT) scan.  Lung (pulmonary) function tests.  Blood tests. TREATMENT  Treatment focuses on making you comfortable (supportive care). Your  caregiver may prescribe medicines (inhaled or pills) to help improve your  breathing. Additional treatment options may include oxygen therapy and pulmonary rehabilitation. Treatment should also include reducing your exposure to known irritants and following a plan to stop smoking. HOME CARE INSTRUCTIONS   Take all medicines, including antibiotic medicines, as directed by your caregiver.  Use inhaled medicines as directed by your caregiver.  Avoid medicines or cough syrups that dry up your airway (antihistamines) and slow down the elimination of secretions. This decreases respiratory capacity and may lead to infections.  If you smoke, stop smoking.  Avoid exposure to smoke, chemicals, and fumes that aggravate your breathing.  Avoid contact with individuals that have a contagious illness.  Avoid extreme temperature and humidity changes.  Use humidifiers at home and at your bedside if they do not make breathing difficult.  Drink enough water and fluids to keep your urine clear or pale yellow. This loosens secretions.  Eat healthy foods. Eating smaller, more frequent meals and resting before meals may help you maintain your strength.  Ask your caregiver about the use of vitamins and mineral supplements.  Stay active. Exercise and physical activity will help maintain your ability to do things you want to do.  Balance activity with periods of rest.  Assume a position of comfort if you become short of breath.  Learn and use relaxation techniques.  Learn and use controlled breathing techniques as directed by your caregiver. Controlled breathing techniques include:  Pursed lip breathing. This breathing technique starts with breathing in (inhaling) through your nose for 1 second. Next, purse your lips as if you were going to whistle. Then breathe out (exhale) through the pursed lips for 2 seconds.  Diaphragmatic breathing. Start by putting one hand on your abdomen just above your waist. Inhale slowly through your nose. The hand on your abdomen should move out.  Then exhale slowly through pursed lips. You should be able to feel the hand on your abdomen moving in as you exhale.  Learn and use controlled coughing to clear mucus from your lungs. Controlled coughing is a series of short, progressive coughs. The steps of controlled coughing are: 1. Lean your head slightly forward. 2. Breathe in deeply using diaphragmatic breathing. 3. Try to hold your breath for 3 seconds. 4. Keep your mouth slightly open while coughing twice. 5. Spit any mucus out into a tissue. 6. Rest and repeat the steps once or twice as needed.  Receive all protective vaccines your caregiver suggests, especially pneumococcal and influenza vaccines.  Learn to manage stress.  Schedule and attend all follow-up appointments as directed by your caregiver. It is important to keep all your appointments.  Participate in pulmonary rehabilitation as directed by your caregiver.  Use home oxygen as suggested. SEEK MEDICAL CARE IF:   You are coughing up more mucus than usual.  There is a change in the color or thickness of the mucus.  Breathing is more labored than usual.  Your breathing is faster than usual.  Your skin color is more cyanotic than usual.  You are running out of the medicine you take for your breathing.  You are anxious, apprehensive, or restless.  You have a fever. SEEK IMMEDIATE MEDICAL CARE IF:   You have a rapid heart rate.  You have shortness of breath while you are resting.  You have shortness of breath that prevents you from being able to talk.  You have shortness of breath that prevents you from performing your usual physical  activities.  You have chest pain lasting longer than 5 minutes.  You have a seizure.  Your family or friends notice that you are agitated or confused. MAKE SURE YOU:   Understand these instructions.  Will watch your condition.  Will get help right away if you are not doing well or get worse. Document Released:  11/07/2004 Document Revised: 10/23/2011 Document Reviewed: 09/24/2012 [facility] Patient Information 2014 [place].

## 2013-02-19 NOTE — ED Notes (Signed)
Kirichenko, PA at bedside for evaluation. 

## 2013-02-19 NOTE — ED Notes (Signed)
Received pt via EMS with c/o bronchitis. Pt reports being sick for 6 years and  having bronchitis for 2 years.

## 2013-02-19 NOTE — ED Notes (Signed)
Respiratory at bedside to give another breathing treatment.

## 2013-02-19 NOTE — ED Notes (Signed)
Respiratory at bedside to give patient breathing treatment.

## 2013-02-24 NOTE — ED Provider Notes (Signed)
Medical screening examination/treatment/procedure(s) were performed by non-physician practitioner and as supervising physician I was immediately available for consultation/collaboration.  EKG Interpretation   None         Orpah Greek, MD 02/24/13 [phone]

## 2013-03-01 ENCOUNTER — Observation Stay (HOSPITAL_COMMUNITY)
Admission: EM | Admit: 2013-03-01 | Discharge: 2013-03-04 | Disposition: A | Discharge status: Home / Self Care | Payer: Non-veteran care | Attending: Internal Medicine | Admitting: Internal Medicine

## 2013-03-01 ENCOUNTER — Encounter (HOSPITAL_COMMUNITY): Payer: Self-pay | Admitting: Emergency Medicine

## 2013-03-01 ENCOUNTER — Emergency Department (HOSPITAL_COMMUNITY): Payer: Non-veteran care

## 2013-03-01 DIAGNOSIS — Z888 Allergy status to other drugs, medicaments and biological substances status: Secondary | ICD-10-CM | POA: Insufficient documentation

## 2013-03-01 DIAGNOSIS — Z720 Tobacco use: Secondary | ICD-10-CM | POA: Diagnosis present

## 2013-03-01 DIAGNOSIS — R062 Wheezing: Secondary | ICD-10-CM | POA: Diagnosis present

## 2013-03-01 DIAGNOSIS — G8929 Other chronic pain: Secondary | ICD-10-CM | POA: Diagnosis not present

## 2013-03-01 DIAGNOSIS — D649 Anemia, unspecified: Secondary | ICD-10-CM | POA: Diagnosis present

## 2013-03-01 DIAGNOSIS — Z5900 Homelessness unspecified: Secondary | ICD-10-CM | POA: Diagnosis not present

## 2013-03-01 DIAGNOSIS — F17200 Nicotine dependence, unspecified, uncomplicated: Secondary | ICD-10-CM | POA: Insufficient documentation

## 2013-03-01 DIAGNOSIS — Z8701 Personal history of pneumonia (recurrent): Secondary | ICD-10-CM | POA: Diagnosis not present

## 2013-03-01 DIAGNOSIS — J449 Chronic obstructive pulmonary disease, unspecified: Secondary | ICD-10-CM

## 2013-03-01 DIAGNOSIS — I369 Nonrheumatic tricuspid valve disorder, unspecified: Secondary | ICD-10-CM

## 2013-03-01 DIAGNOSIS — R5381 Other malaise: Secondary | ICD-10-CM | POA: Diagnosis not present

## 2013-03-01 DIAGNOSIS — M25519 Pain in unspecified shoulder: Secondary | ICD-10-CM | POA: Insufficient documentation

## 2013-03-01 DIAGNOSIS — Z8719 Personal history of other diseases of the digestive system: Secondary | ICD-10-CM | POA: Diagnosis not present

## 2013-03-01 DIAGNOSIS — J09X2 Influenza due to identified novel influenza A virus with other respiratory manifestations: Secondary | ICD-10-CM | POA: Diagnosis present

## 2013-03-01 DIAGNOSIS — F329 Major depressive disorder, single episode, unspecified: Secondary | ICD-10-CM

## 2013-03-01 DIAGNOSIS — G8911 Acute pain due to trauma: Secondary | ICD-10-CM | POA: Insufficient documentation

## 2013-03-01 DIAGNOSIS — R5383 Other fatigue: Secondary | ICD-10-CM | POA: Diagnosis not present

## 2013-03-01 DIAGNOSIS — S2239XA Fracture of one rib, unspecified side, initial encounter for closed fracture: Secondary | ICD-10-CM

## 2013-03-01 DIAGNOSIS — J441 Chronic obstructive pulmonary disease with (acute) exacerbation: Principal | ICD-10-CM | POA: Insufficient documentation

## 2013-03-01 DIAGNOSIS — Z8781 Personal history of (healed) traumatic fracture: Secondary | ICD-10-CM | POA: Insufficient documentation

## 2013-03-01 DIAGNOSIS — I509 Heart failure, unspecified: Secondary | ICD-10-CM

## 2013-03-01 DIAGNOSIS — F3289 Other specified depressive episodes: Secondary | ICD-10-CM

## 2013-03-01 DIAGNOSIS — E119 Type 2 diabetes mellitus without complications: Secondary | ICD-10-CM | POA: Insufficient documentation

## 2013-03-01 DIAGNOSIS — Z862 Personal history of diseases of the blood and blood-forming organs and certain disorders involving the immune mechanism: Secondary | ICD-10-CM | POA: Diagnosis not present

## 2013-03-01 DIAGNOSIS — S2249XA Multiple fractures of ribs, unspecified side, initial encounter for closed fracture: Secondary | ICD-10-CM

## 2013-03-01 DIAGNOSIS — L851 Acquired keratosis [keratoderma] palmaris et plantaris: Secondary | ICD-10-CM

## 2013-03-01 DIAGNOSIS — R0609 Other forms of dyspnea: Secondary | ICD-10-CM

## 2013-03-01 DIAGNOSIS — R9431 Abnormal electrocardiogram [ECG] [EKG]: Secondary | ICD-10-CM | POA: Diagnosis present

## 2013-03-01 DIAGNOSIS — I4891 Unspecified atrial fibrillation: Secondary | ICD-10-CM

## 2013-03-01 DIAGNOSIS — Z880 Allergy status to penicillin: Secondary | ICD-10-CM | POA: Diagnosis not present

## 2013-03-01 DIAGNOSIS — I10 Essential (primary) hypertension: Secondary | ICD-10-CM | POA: Diagnosis not present

## 2013-03-01 DIAGNOSIS — R0602 Shortness of breath: Secondary | ICD-10-CM | POA: Diagnosis present

## 2013-03-01 HISTORY — DX: Fracture of unspecified part of unspecified clavicle, initial encounter for closed fracture: S42.009A

## 2013-03-01 LAB — COMPREHENSIVE METABOLIC PANEL
ALBUMIN: 3.1 g/dL — AB (ref 3.5–5.2)
ALK PHOS: 176 U/L — AB (ref 39–117)
ALT: 19 U/L (ref 0–53)
AST: 21 U/L (ref 0–37)
BILIRUBIN TOTAL: 0.3 mg/dL (ref 0.3–1.2)
BUN: 17 mg/dL (ref 6–23)
CHLORIDE: 98 meq/L (ref 96–112)
CO2: 23 mEq/L (ref 19–32)
Calcium: 8.9 mg/dL (ref 8.4–10.5)
Creatinine, Ser: 0.81 mg/dL (ref 0.50–1.35)
GFR calc Af Amer: >90 mL/min (ref >90)
GFR calc non Af Amer: 81 mL/min — ABNORMAL LOW (ref >90)
GLUCOSE: 150 mg/dL — AB (ref 70–99)
POTASSIUM: 4.3 meq/L (ref 3.7–5.3)
SODIUM: 136 meq/L — AB (ref 137–147)
Total Protein: 7.7 g/dL (ref 6.0–8.3)

## 2013-03-01 LAB — CBC WITH DIFFERENTIAL/PLATELET
BASOS PCT: 0 % (ref 0–1)
Basophils Absolute: 0 10*3/uL (ref 0.0–0.1)
EOS PCT: 1 % (ref 0–5)
Eosinophils Absolute: 0.1 10*3/uL (ref 0.0–0.7)
HEMATOCRIT: 34.6 % — AB (ref 39.0–52.0)
HEMOGLOBIN: 10.7 g/dL — AB (ref 13.0–17.0)
LYMPHS ABS: 0.6 10*3/uL — AB (ref 0.7–4.0)
Lymphocytes Relative: 7 % — ABNORMAL LOW (ref 12–46)
MCH: 24 pg — ABNORMAL LOW (ref 26.0–34.0)
MCHC: 30.9 g/dL (ref 30.0–36.0)
MCV: 77.8 fL — AB (ref 78.0–100.0)
MONO ABS: 0.2 10*3/uL (ref 0.1–1.0)
Monocytes Relative: 2 % — ABNORMAL LOW (ref 3–12)
Neutro Abs: 8.1 10*3/uL — ABNORMAL HIGH (ref 1.7–7.7)
Neutrophils Relative %: 90 % — ABNORMAL HIGH (ref 43–77)
Platelets: 357 10*3/uL (ref 150–400)
RBC: 4.45 MIL/uL (ref 4.22–5.81)
RDW: 16.3 % — ABNORMAL HIGH (ref 11.5–15.5)
WBC: 9 10*3/uL (ref 4.0–10.5)

## 2013-03-01 LAB — POCT I-STAT, CHEM 8
BUN: 12 mg/dL (ref 6–23)
CALCIUM ION: 1.19 mmol/L (ref 1.13–1.30)
CREATININE: 0.8 mg/dL (ref 0.50–1.35)
Chloride: 102 mEq/L (ref 96–112)
GLUCOSE: 124 mg/dL — AB (ref 70–99)
HCT: 36 % — ABNORMAL LOW (ref 39.0–52.0)
HEMOGLOBIN: 12.2 g/dL — AB (ref 13.0–17.0)
Potassium: 3.8 mEq/L (ref 3.7–5.3)
SODIUM: 138 meq/L (ref 137–147)
TCO2: 25 mmol/L (ref 0–100)

## 2013-03-01 LAB — INFLUENZA PANEL BY PCR (TYPE A & B)
H1N1 flu by pcr: NOT DETECTED
INFLBPCR: NEGATIVE
Influenza A By PCR: POSITIVE — AB

## 2013-03-01 LAB — MAGNESIUM: MAGNESIUM: 1.9 mg/dL (ref 1.5–2.5)

## 2013-03-01 LAB — POCT I-STAT TROPONIN I: Troponin i, poc: 0.01 ng/mL (ref 0.00–0.08)

## 2013-03-01 LAB — GLUCOSE, CAPILLARY: GLUCOSE-CAPILLARY: 160 mg/dL — AB (ref 70–99)

## 2013-03-01 LAB — PRO B NATRIURETIC PEPTIDE: Pro B Natriuretic peptide (BNP): 1679 pg/mL — ABNORMAL HIGH (ref 0–450)

## 2013-03-01 LAB — TROPONIN I: Troponin I: <0.3 ng/mL (ref <0.30)

## 2013-03-01 MED ORDER — ACETAMINOPHEN 325 MG PO TABS: 650.0000 mg | ORAL_TABLET | Freq: Four times a day (QID) | ORAL | Status: DC | PRN

## 2013-03-01 MED ORDER — ALBUTEROL SULFATE (2.5 MG/3ML) 0.083% IN NEBU
5.0000 mg | INHALATION_SOLUTION | Freq: Once | RESPIRATORY_TRACT | Status: AC
  Administered 2013-03-01: 5 mg via RESPIRATORY_TRACT
  Filled 2013-03-01: qty 6

## 2013-03-01 MED ORDER — PANTOPRAZOLE SODIUM 40 MG PO TBEC
40.0000 mg | DELAYED_RELEASE_TABLET | Freq: Two times a day (BID) | ORAL | Status: DC
  Administered 2013-03-01 – 2013-03-04 (×7): 40 mg via ORAL
  Filled 2013-03-01 (×5): qty 1

## 2013-03-01 MED ORDER — OSELTAMIVIR PHOSPHATE 75 MG PO CAPS
75.0000 mg | ORAL_CAPSULE | Freq: Two times a day (BID) | ORAL | Status: DC
  Administered 2013-03-01: 75 mg via ORAL
  Filled 2013-03-01 (×3): qty 1

## 2013-03-01 MED ORDER — OSELTAMIVIR PHOSPHATE 75 MG PO CAPS: 75.0000 mg | ORAL_CAPSULE | Freq: Every day | ORAL | Status: DC

## 2013-03-01 MED ORDER — METHYLPREDNISOLONE SODIUM SUCC 125 MG IJ SOLR
125.0000 mg | Freq: Once | INTRAMUSCULAR | Status: AC
  Administered 2013-03-01: 125 mg via INTRAVENOUS
  Filled 2013-03-01: qty 2

## 2013-03-01 MED ORDER — ACETAMINOPHEN 650 MG RE SUPP: 650.0000 mg | Freq: Four times a day (QID) | RECTAL | Status: DC | PRN

## 2013-03-01 MED ORDER — FUROSEMIDE 10 MG/ML IJ SOLN
20.0000 mg | INTRAMUSCULAR | Status: AC
  Administered 2013-03-01: 20 mg via INTRAVENOUS
  Filled 2013-03-01: qty 2

## 2013-03-01 MED ORDER — LEVALBUTEROL HCL 0.63 MG/3ML IN NEBU
0.6300 mg | INHALATION_SOLUTION | Freq: Three times a day (TID) | RESPIRATORY_TRACT | Status: DC
  Administered 2013-03-02 – 2013-03-04 (×8): 0.63 mg via RESPIRATORY_TRACT
  Filled 2013-03-01 (×16): qty 3

## 2013-03-01 MED ORDER — IPRATROPIUM BROMIDE 0.02 % IN SOLN
0.5000 mg | Freq: Once | RESPIRATORY_TRACT | Status: AC
  Administered 2013-03-01: 0.5 mg via RESPIRATORY_TRACT
  Filled 2013-03-01: qty 2.5

## 2013-03-01 MED ORDER — NICOTINE 21 MG/24HR TD PT24
21.0000 mg | MEDICATED_PATCH | Freq: Every day | TRANSDERMAL | Status: DC
  Administered 2013-03-01 – 2013-03-04 (×4): 21 mg via TRANSDERMAL
  Filled 2013-03-01 (×4): qty 1

## 2013-03-01 MED ORDER — LEVALBUTEROL HCL 0.63 MG/3ML IN NEBU: 0.6300 mg | INHALATION_SOLUTION | Freq: Two times a day (BID) | RESPIRATORY_TRACT | Status: DC | PRN

## 2013-03-01 MED ORDER — ENOXAPARIN SODIUM 40 MG/0.4ML ~~LOC~~ SOLN
40.0000 mg | SUBCUTANEOUS | Status: DC
  Administered 2013-03-01 – 2013-03-03 (×3): 40 mg via SUBCUTANEOUS
  Filled 2013-03-01 (×4): qty 0.4

## 2013-03-01 MED ORDER — HYDROCERIN EX CREA
TOPICAL_CREAM | Freq: Two times a day (BID) | CUTANEOUS | Status: DC
  Administered 2013-03-01 – 2013-03-04 (×7): via TOPICAL
  Filled 2013-03-01: qty 113

## 2013-03-01 MED ORDER — FUROSEMIDE 10 MG/ML IJ SOLN
20.0000 mg | Freq: Once | INTRAMUSCULAR | Status: AC
  Administered 2013-03-01: 20 mg via INTRAVENOUS
  Filled 2013-03-01: qty 2

## 2013-03-01 MED ORDER — IPRATROPIUM BROMIDE 0.02 % IN SOLN
0.5000 mg | Freq: Three times a day (TID) | RESPIRATORY_TRACT | Status: DC
  Administered 2013-03-02 – 2013-03-04 (×8): 0.5 mg via RESPIRATORY_TRACT
  Filled 2013-03-01 (×8): qty 2.5

## 2013-03-01 MED ORDER — LEVALBUTEROL HCL 0.63 MG/3ML IN NEBU
0.6300 mg | INHALATION_SOLUTION | Freq: Four times a day (QID) | RESPIRATORY_TRACT | Status: DC
  Administered 2013-03-01 (×2): 0.63 mg via RESPIRATORY_TRACT
  Filled 2013-03-01 (×3): qty 3

## 2013-03-01 MED ORDER — IPRATROPIUM BROMIDE 0.02 % IN SOLN
0.5000 mg | Freq: Four times a day (QID) | RESPIRATORY_TRACT | Status: DC
  Administered 2013-03-01 (×2): 0.5 mg via RESPIRATORY_TRACT
  Filled 2013-03-01 (×2): qty 2.5

## 2013-03-01 MED ORDER — SODIUM CHLORIDE 0.9 % IJ SOLN
3.0000 mL | Freq: Two times a day (BID) | INTRAMUSCULAR | Status: DC
  Administered 2013-03-01 – 2013-03-03 (×4): 3 mL via INTRAVENOUS

## 2013-03-01 NOTE — ED Notes (Signed)
Food tray ordered for patient.
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
## 2013-03-01 NOTE — ED Provider Notes (Signed)
Medical screening examination/treatment/procedure(s) were conducted as a shared visit with non-physician practitioner(s) or resident  and myself.  I personally evaluated the patient during the encounter and agree with the findings and plan unless otherwise indicated.    I have personally reviewed any xrays and/ or EKG's with the provider and I agree with interpretation.   See separate note.  Acute COPD, Acute CHF, Cough  Mariea Clonts, MD 03/01/13 [phone]

## 2013-03-01 NOTE — Progress Notes (Signed)
  Echocardiogram 2D Echocardiogram has been performed.  Dale Mcfarland Dale Mcfarland 03/01/2013, 5:20 PM

## 2013-03-01 NOTE — ED Provider Notes (Signed)
Medical screening examination/treatment/procedure(s) were conducted as a shared visit with non-physician practitioner(s) or resident  and myself.  I personally evaluated the patient during the encounter and agree with the findings and plan unless otherwise indicated.    I have personally reviewed any xrays and/ or EKG's with the provider and I agree with interpretation.   L shoulder pain and sob for a month.  Denies CP.  Exam exp wheeze and crackles bilateral, no distress, afebrile, non toxic appearing.  Pt treated for COPD exacerbation.  Improved on recheck.  L shoulder pain chronic. Abd soft, NT.  CXR shows pulm edema as well.  With no outpt fup, homeless plan for observation for lasix/ nebs.  Lasix in ED.    Acute COPD exacerbation, Acute chf   Labs Reviewed  CBC WITH DIFFERENTIAL - Abnormal; Notable for the following:    Hemoglobin 10.7 (*)    HCT 34.6 (*)    MCV 77.8 (*)    MCH 24.0 (*)    RDW 16.3 (*)    Neutrophils Relative % 90 (*)    Neutro Abs 8.1 (*)    Lymphocytes Relative 7 (*)    Lymphs Abs 0.6 (*)    Monocytes Relative 2 (*)    All other components within normal limits  PRO B NATRIURETIC PEPTIDE - Abnormal; Notable for the following:    Pro B Natriuretic peptide (BNP) 1679.0 (*)    All other components within normal limits  POCT I-STAT, CHEM 8 - Abnormal; Notable for the following:    Glucose, Bld 124 (*)    Hemoglobin 12.2 (*)    HCT 36.0 (*)    All other components within normal limits  POCT I-STAT TROPONIN I   Medical screening examination/treatment/procedure(s) were performed by non-physician practitioner and as supervising physician I was immediately available for consultation/collaboration.  EKG Interpretation    Date/Time:  Monday March 01 2013 06:58:06 EST Ventricular Rate:  75 PR Interval:  196 QRS Duration: 98 QT Interval:  444 QTC Calculation: 496 R Axis:   66 Text Interpretation:  Sinus rhythm Multiple premature complexes, vent  Probable left  atrial enlargement Probable anteroseptal infarct, old Confirmed by Abdikadir Fohl  MD, Lesli Issa ([id number]) on 03/01/2013 7:57:58 AM              Mariea Clonts, MD 03/01/13 0800
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
## 2013-03-02 ENCOUNTER — Observation Stay (HOSPITAL_COMMUNITY): Payer: Non-veteran care

## 2013-03-02 DIAGNOSIS — I509 Heart failure, unspecified: Secondary | ICD-10-CM | POA: Diagnosis present

## 2013-03-02 DIAGNOSIS — J09X2 Influenza due to identified novel influenza A virus with other respiratory manifestations: Secondary | ICD-10-CM

## 2013-03-02 DIAGNOSIS — M25559 Pain in unspecified hip: Secondary | ICD-10-CM

## 2013-03-02 LAB — GLUCOSE, CAPILLARY
GLUCOSE-CAPILLARY: 117 mg/dL — AB (ref 70–99)
Glucose-Capillary: 106 mg/dL — ABNORMAL HIGH (ref 70–99)

## 2013-03-02 LAB — BASIC METABOLIC PANEL
BUN: 31 mg/dL — ABNORMAL HIGH (ref 6–23)
CO2: 24 meq/L (ref 19–32)
Calcium: 8.6 mg/dL (ref 8.4–10.5)
Chloride: 100 mEq/L (ref 96–112)
Creatinine, Ser: 1.14 mg/dL (ref 0.50–1.35)
GFR calc Af Amer: 68 mL/min — ABNORMAL LOW (ref >90)
GFR calc non Af Amer: 58 mL/min — ABNORMAL LOW (ref >90)
GLUCOSE: 96 mg/dL (ref 70–99)
POTASSIUM: 3.7 meq/L (ref 3.7–5.3)
SODIUM: 138 meq/L (ref 137–147)

## 2013-03-02 MED ORDER — OSELTAMIVIR PHOSPHATE 30 MG PO CAPS
30.0000 mg | ORAL_CAPSULE | Freq: Two times a day (BID) | ORAL | Status: DC
  Administered 2013-03-02 – 2013-03-04 (×5): 30 mg via ORAL
  Filled 2013-03-02 (×8): qty 1

## 2013-03-02 MED ORDER — HYDROCODONE-ACETAMINOPHEN 7.5-325 MG PO TABS: 1.0000 | ORAL_TABLET | Freq: Four times a day (QID) | ORAL | Status: DC | PRN
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
## 2013-03-02 NOTE — Evaluation (Signed)
Physical Therapy Evaluation and Discharge Patient Details Name: Dale Mcfarland MRN: [id number] DOB: Sep 14, 1929 Today's Date: 03/02/2013 Time: 0092-3300 PT Time Calculation (min): 36 min  PT Assessment / Plan / Recommendation History of Present Illness  Dale Mcfarland is a 80 y.o. male with a PMHx significant for COPD, chronic back and shoulder pain, homelessness presents to the ED with a chief complaint of shortness of breath/CHF exacerbation.  Clinical Impression  Pt very pleasant, however also "set in his ways" and does not want to follow recommended PT advice for use of RW. He reports the only device he will use is a walking stick, as he can use it for support and as a weapon if needed. Pt participated in [place], yet was unconvinced of the need for assist when up or need for use of RW. (scored 23/56). No further PT indicated as pt unwilling to follow PT recommendations.     PT Assessment  Patent does not need any further PT services    Follow Up Recommendations  No PT follow up    Does the patient have the potential to tolerate intense rehabilitation      Barriers to Discharge        Equipment Recommendations  None recommended by PT (pt refuses to use anything except a walking stick)    Recommendations for Other Services     Frequency      Precautions / Restrictions Precautions Precautions: Fall   Pertinent Vitals/Pain SaO2 97% on RA with ambulation; HR 94      Mobility  Bed Mobility Overal bed mobility: Independent General bed mobility comments: sitting cross-legged in bed on arrival; had spilled coffee in his bed Transfers Overall transfer level: Needs assistance Equipment used: 1 person hand held assist Transfers: Sit to/from Stand Sit to Stand: Min guard General transfer comment: typically uses a walking stick in Lt hand; simulated via Lt HHA; steady assist Ambulation/Gait Ambulation/Gait assistance: Min assist Ambulation Distance (Feet): 160 Feet  (then 30, 30, 15 (as trying to get pt back to bed or chair)) Assistive device: 1 person hand held assist Gait Pattern/deviations: Step-through pattern;Decreased stride length;Wide base of support Gait velocity interpretation: Below normal speed for age/gender General Gait Details: reports he feels he is 'rocking" states it is due to the machines we use around here--he feels the vibrations from them; denies h/o inner ear problems    Exercises     PT Diagnosis:    PT Problem List:   PT Treatment Interventions:       PT Goals(Current goals can be found in the care plan section)    Visit Information  Last PT Received On: 03/02/13 Assistance Needed: +1 History of Present Illness: Dale Mcfarland is a 80 y.o. male with a PMHx significant for COPD, chronic back and shoulder pain, homelessness presents to the ED with a chief complaint of shortness of breath/CHF exacerbation.       Prior [place] expects to be discharged to:: Shelter/Homeless Prior Function Level of Independence: Independent with assistive device(s) Comments: uses an walking stick Communication Communication: No difficulties    Cognition  Cognition Arousal/Alertness: Awake/alert Behavior During Therapy: WFL for tasks assessed/performed Overall Cognitive Status: No family/caregiver present to determine baseline cognitive functioning (decr safety awareness; refuses use of walker)    Extremity/Trunk Assessment Upper Extremity Assessment Upper Extremity Assessment: Generalized weakness Lower Extremity Assessment Lower Extremity Assessment: Generalized weakness Cervical / Trunk Assessment Cervical / Trunk Assessment: Normal   Balance Balance Overall balance  assessment: Needs assistance Sitting-balance support: No upper extremity supported;Feet unsupported Sitting balance-Leahy Scale: Normal Standing balance support: No upper extremity supported Standing balance-Leahy Scale:  Fair Standing balance comment: Able to statically stand without loss of balance Tandem Stance - Left Leg: 0 (able to advance Lt foot past Rt foot and hold with minguard ) Rhomberg - Eyes Opened: 0 (unable to achieve stance) Standardized Balance Assessment Standardized Balance Assessment : Berg Balance Test Berg Balance Test Sit to Stand: Needs minimal aid to stand or to stabilize Standing Unsupported: Able to stand 30 seconds unsupported Sitting with Back Unsupported but Feet Supported on Floor or Stool: Able to sit safely and securely 2 minutes Stand to Sit: Sits safely with minimal use of hands Transfers: Able to transfer with verbal cueing and /or supervision Standing Unsupported with Eyes Closed: Unable to keep eyes closed 3 seconds but stays steady Standing Ubsupported with Feet Together: Needs help to attain position and unable to hold for 15 seconds From Standing, Reach Forward with Outstretched Arm: Reaches forward but needs supervision From Standing Position, Pick up Object from Floor: Able to pick up shoe, needs supervision From Standing Position, Turn to Look Behind Over each Shoulder: Turn sideways only but maintains balance Turn 360 Degrees: Needs assistance while turning Standing Unsupported, Alternately Place Feet on Step/Stool: Able to complete >2 steps/needs minimal assist Standing Unsupported, One Foot in Front: Able to take small step independently and hold 30 seconds Standing on One Leg: Unable to try or needs assist to prevent fall Total Score: 23 General Comments General comments (skin integrity, edema, etc.): Pt refusing to use RW and refuses to return to bed or call for assist when getting up. No chair alarms available. Notified RN and Chartered certified accountant Lobbyist).  End of Session PT - End of Session Equipment Utilized During Treatment: Gait belt Activity Tolerance: Treatment limited secondary to medical complications (Comment) (dyspnea and coughing) Patient  left: in chair;with call bell/phone within reach Nurse Communication: Mobility status;Other (comment) (need for chair alarm)  GP     Dorina Ribaudo 03/02/2013, 9:59 AM Pager [phone]
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
## 2013-03-02 NOTE — Care Management Note (Addendum)
    Page 1 of 2   03/04/2013     10:52:54 AM   CARE MANAGEMENT NOTE 03/04/2013  Patient:  Dale Mcfarland, Dale Mcfarland   Account Number:  [id number]  Date Initiated:  03/02/2013  Documentation initiated by:  GRAVES-BIGELOW,Reymundo Winship  Subjective/Objective Assessment:   Pt admitted for Shortness of breath. Given 1 dose Iv lasix. Per MD pt is planned for d/c today. CM will call CSW for resources. Pt states he has family in [place] and refuses to contact them.     Action/Plan:   CM did call [facility] to see if they can pull up his medicare benefit. Pt states he lost his card. CM did call [facility] to make them aware pt is here and toget some assistance via [place]. Pt has had multiple admissions. No PCP.   Anticipated DC Date:  03/02/2013   Anticipated DC Plan:  [place]  CM consult      Choice offered to / List presented to:             Status of service:  Completed, signed off Medicare Important Message given?   (If response is "NO", the following Medicare IM given date fields will be blank) Date Medicare IM given:   Date Additional Medicare IM given:    Discharge Disposition:  HOME/SELF CARE  Per UR Regulation:  Reviewed for med. necessity/level of care/duration of stay  If discussed at [place] of Stay Meetings, dates discussed:    Comments:  03-04-13 [address] Jacqlyn Krauss, RN,BSN [phone] CM will not be able to provide pt with tamiflu due to pt has insurance. No further needs from Cm at this time.   03-02-13 [address], [place] [phone] CM did call MD in reference to d/c disposiiton. Per MD pt will not be ready for d/c today due to psych consult. CSW did provide resource list for shelters. No further needs from CM at this time.   On pt's last vist visit- he was set up with the [facility]. CM did call to see if pt actually did establish care with them. Awaiting call back. Will continue to monitor. Jacqlyn Krauss,  RN, BSN [phone] 03-02-13 1210.

## 2013-03-02 NOTE — Progress Notes (Signed)
Subjective: Patient seen and examined at the bedside this morning. He says he feels well. Other than his chronic shortness of breath and cough, there were no acute changes overnight. He denies fever, chills. He was asymptomatic during his episode of sinus bradycardia overnight. He tells me he is willing to followup with a doctor, including possibly the internal medicine clinic. He says he has Medicare but no prescription drug plan. That being said, he is willing to pay for her medicines if they're not too expensive.  Apparently after I saw the patient he told my attending he had right hip pain and that he did not want to leave the hospital.  Objective: Vital signs in last 24 hours: Filed Vitals:   03/01/13 1500 03/01/13 1817 03/01/13 2010 03/02/13 0808  BP:   118/45   Pulse:   82   Temp:  99.3 F (37.4 C) 98.5 F (36.9 C)   TempSrc:  Oral Oral   Resp:      Height: 5' 6.93" (1.7 m)     Weight: 132 lb 0.9 oz (59.9 kg)     SpO2:   99% 99%   Weight change:   Intake/Output Summary (Last 24 hours) at 03/02/13 1150 Last data filed at 03/02/13 0155  Gross per 24 hour  Intake      0 ml  Output    625 ml  Net   -625 ml   Physical Exam  Constitutional: He is oriented to person, place, and time and well-developed, well-nourished, and in no distress.  HENT:  Head: Normocephalic and atraumatic.  Eyes: Conjunctivae and EOM are normal. Pupils are equal, round, and reactive to light.  Neck: Normal range of motion. Neck supple. No JVD present.  Cardiovascular: Normal rate, regular rhythm and normal heart sounds. Exam reveals no gallop and no friction rub.  No murmur heard. No LE edema.  Pulmonary/Chest: Effort normal. No respiratory distress. He has no rales. He exhibits no tenderness.  Mild crackles at right base. Otherwise clear to auscultation.  Abdominal: Soft. He exhibits no distension. There is no tenderness.  Musculoskeletal: Normal range of motion.  Clavicle deformity on left, not  tender to palpation.  Neurological: He is alert and oriented to person, place, and time. No cranial nerve deficit. GCS score is 15.  Skin: Skin is warm and dry.  Dry, flaky skin on feet.   Lab Results: Basic Metabolic Panel:  Recent Labs Lab 03/01/13 1405 03/02/13 0625  NA 136* 138  K 4.3 3.7  CL 98 100  CO2 23 24  GLUCOSE 150* 96  BUN 17 31*  CREATININE 0.81 1.14  CALCIUM 8.9 8.6  MG 1.9  --    Liver Function Tests:  Recent Labs Lab 03/01/13 1405  AST 21  ALT 19  ALKPHOS 176*  BILITOT 0.3  PROT 7.7  ALBUMIN 3.1*   No results found for this basename: LIPASE, AMYLASE,  in the last 168 hours No results found for this basename: AMMONIA,  in the last 168 hours CBC:  Recent Labs Lab 03/01/13 0645 03/01/13 0710  WBC 9.0  --   NEUTROABS 8.1*  --   HGB 10.7* 12.2*  HCT 34.6* 36.0*  MCV 77.8*  --   PLT 357  --    Cardiac Enzymes:  Recent Labs Lab 03/01/13 1405  TROPONINI <0.30   BNP:  Recent Labs Lab 03/01/13 0645  PROBNP 1679.0*   D-Dimer: No results found for this basename: DDIMER,  in the last 168 hours  CBG:  Recent Labs Lab 03/01/13 1804 03/02/13 1024  GLUCAP 160* 117*   Hemoglobin A1C: No results found for this basename: HGBA1C,  in the last 168 hours Fasting Lipid Panel: No results found for this basename: CHOL, HDL, LDLCALC, TRIG, CHOLHDL, LDLDIRECT,  in the last 168 hours Thyroid Function Tests: No results found for this basename: TSH, T4TOTAL, FREET4, T3FREE, THYROIDAB,  in the last 168 hours Coagulation: No results found for this basename: LABPROT, INR,  in the last 168 hours Anemia Panel: No results found for this basename: VITAMINB12, FOLATE, FERRITIN, TIBC, IRON, RETICCTPCT,  in the last 168 hours Urine Drug Screen: Drugs of Abuse     Component Value Date/Time   LABOPIA NONE DETECTED 01/06/2013 [place] 01/06/2013 1554   LABBENZ NONE DETECTED 01/06/2013 1554   AMPHETMU NONE DETECTED 01/06/2013 1554     THCU NONE DETECTED 01/06/2013 1554   LABBARB NONE DETECTED 01/06/2013 1554    Alcohol Level: No results found for this basename: ETH,  in the last 168 hours Urinalysis: No results found for this basename: COLORURINE, APPERANCEUR, LABSPEC, PHURINE, GLUCOSEU, HGBUR, BILIRUBINUR, KETONESUR, PROTEINUR, UROBILINOGEN, NITRITE, LEUKOCYTESUR,  in the last 168 hours   Micro Results: No results found for this or any previous visit (from the past 240 hour(s)). Studies/Results: Dg Chest 2 View  03/01/2013   CLINICAL DATA:  Wheezing cough and left-sided shoulder pain.  EXAM: CHEST  2 VIEW  COMPARISON:  Chest radiograph performed 02/19/2013  FINDINGS: The lungs are well-aerated. Vascular congestion is noted. Increased interstitial markings raise concern for pulmonary edema. Trace fluid is seen tracking along the major fissures. No pneumothorax is seen.  The heart is borderline normal in size; the mediastinal contour is within normal limits. No acute osseous abnormalities are seen.  IMPRESSION: Vascular congestion noted, with increased interstitial markings, raising concern for pulmonary edema. Trace fluid seen tracking along the major fissures.   Electronically Signed   By: Garald Balding M.D.   On: 03/01/2013 06:10   Medications: I have reviewed the patient's current medications. Scheduled Meds: . enoxaparin (LOVENOX) injection  40 mg Subcutaneous Q24H  . hydrocerin   Topical BID  . ipratropium  0.5 mg Nebulization TID  . levalbuterol  0.63 mg Nebulization TID  . nicotine  21 mg Transdermal Daily  . oseltamivir  30 mg Oral BID  . pantoprazole  40 mg Oral BID  . sodium chloride  3 mL Intravenous Q12H   Continuous Infusions:  PRN Meds:.acetaminophen, acetaminophen, levalbuterol Assessment/Plan: Dale Mcfarland is a 80 y.o. male with a PMHx significant for COPD, chronic back and shoulder pain, homelessness presents to the ED with a chief complaint of shortness of breath.   #CHF exacerbation - Patient  presents with shortness of breath x5 months with associated cough and possible PND. He is s/p Lasix 20 mg IV x2. All vital signs are stable and he is saturating well in room air. 2-D echo yesterday showed mild LVH, normal systolic function, ejection fraction 50-55%, no regional wall motion abnormalities. He did have distal septal and posterior basal hypokinesis, perhaps suggestive of a prior MI. On physical exam he has minimal crackles in the base of his right lung, no lower extremity edema. A vital signs are stable and he is saturating well on room air. PT/OT evaluated him and have no recs. - Tylenol 650 mg every 6 hours when necessary for pain or fever  - Daily weights  - Intake and output  - Heart healthy  diet  - Medically stable for discharge today pending hip XR (see below)  #Influenza A positive - Patient does not clinically appear to have the flu. He is afebrile, denies sore throat, rhinorrhea, muscle aches. I do not think he requires Tamiflu.  #Possible thickening or mass proximal to the pulmonary valve - Seen incidentally on 2-D echo, maybe off angle plane by the tech. No evidence of pulmonic valve stenosis. Trivial pulmonic valve regurgitation. Transjugular velocity within the normal range. No pulmonary hypertension. No right atrial or ventricular enlargement. Significance unclear. Clinical picture is not consistent with endocarditis. Patient is not an IV drug user. - Recommend outpatient cardiology followup  #Possible COPD - No wheezing on exam. Patient does not appear to be in a COPD exacerbation at this time. We will hold off on steroids and antibiotics at this time.  - Duo nebs three times a day  - Continue to monitor  - Outpatient PFTs  #Questionable episode of atrial fibrillation - Transient, no history of AF. Telemetry overnight remarkable for one transient episode of sinus bradycardia, heart rate 38-40, no heart block, patient was asymptomatic. - Continue to monitor on telemetry    - Recommend outpatient cardiology followup  #Right hip pain - Will obtain XR.  #History of GI bleed - Patient was admitted in November 2014 for a GI bleed thought to be 2/2 untreated GER, persistent severe esophagitis, and possibly hemorrhoids. He received 2 units packed red blood cells for hemoglobin 7.1. GI was consulted and recommended that he needed to be treated with a PPI BID in the hospital and for the long term as an outpatient. They did not perform an EGD/colonoscopy. Here his hemoglobin is 12.2.  - Protonix 40 mg twice a day   #Dry skin - Eucerin cream daily.   #Tobacco abuse - Smokes 1PPD.  - Nicoderm patch 21mg  daily.  - Nurse to provide smoking cessation education   #Homelessness - HIV and hepatitis panel were negative on 01/06/13.  - Social work and care management consult for medication needs and homelessness, social work to meet with him about insurance as he appears to have [employer] as well as Medicare - We are happy to followup with him in the [facility] until he can be linked in with the [place]  #DVT PPX - Lovenox subcutaneous, SCDs   Dispo: Disposition is deferred at this time, awaiting improvement of current medical problems.  Anticipated discharge in approximately 0-2 day(s).   The patient does not have a current PCP (No Pcp Per Patient) and does need an [facility] hospital follow-up appointment after discharge.  The patient does not have transportation limitations that hinder transportation to clinic appointments.  .Services Needed at time of discharge: Y = Yes, Blank = No PT: None  OT: None  RN:   Equipment: None  Other:     LOS: 1 day   Lesly Dukes, MD 03/02/2013, 11:50 AM

## 2013-03-02 NOTE — Progress Notes (Signed)
CSW provided patient with housing resources, homeless shelters and food resources. Patient has had help from his daughter in the past for housing but patient likes his independence. Clinical Social Worker will sign off for now as social work intervention is no longer needed. Please consult [place] again if new need arises.   Rhea Pink, MSW, [place]

## 2013-03-02 NOTE — Progress Notes (Signed)
UR Completed Latorria Zeoli Graves-Bigelow, RN,BSN [phone]  

## 2013-03-02 NOTE — Progress Notes (Signed)
Notified by CCMD PTs HR intermittently dropping to low 40's non sustained.  Pt sleeping soundly/easily aroused/no complaints voiced. See saved strips. Will continue to monitor. Jessie Foot, RN

## 2013-03-02 NOTE — ED Provider Notes (Signed)
Medical screening examination/treatment/procedure(s) were conducted as a shared visit with non-physician practitioner(s) or resident  and myself.  I personally evaluated the patient during the encounter and agree with the findings and plan unless otherwise indicated.    I have personally reviewed any xrays and/ or EKG's with the provider and I agree with interpretation.   See separate note.  Mariea Clonts, MD 03/02/13 [phone]

## 2013-03-02 NOTE — Progress Notes (Signed)
OT Cancellation Note  Patient Details Name: Dale Mcfarland MRN: [id number] DOB: Sep 30, 1929   Cancelled Treatment:    Reason Eval/Treat Not Completed: OT screened, no needs identified, will sign off  [facility], OTR/L  [phone] 03/02/2013 03/02/2013, 10:36 AM

## 2013-03-03 DIAGNOSIS — I714 Abdominal aortic aneurysm, without rupture, unspecified: Secondary | ICD-10-CM

## 2013-03-03 DIAGNOSIS — F329 Major depressive disorder, single episode, unspecified: Secondary | ICD-10-CM

## 2013-03-03 DIAGNOSIS — I4891 Unspecified atrial fibrillation: Secondary | ICD-10-CM

## 2013-03-03 DIAGNOSIS — R079 Chest pain, unspecified: Secondary | ICD-10-CM

## 2013-03-03 DIAGNOSIS — F3289 Other specified depressive episodes: Secondary | ICD-10-CM

## 2013-03-03 LAB — GLUCOSE, CAPILLARY
GLUCOSE-CAPILLARY: 92 mg/dL (ref 70–99)
GLUCOSE-CAPILLARY: 213 mg/dL — AB (ref 70–99)
Glucose-Capillary: 98 mg/dL (ref 70–99)

## 2013-03-03 MED ORDER — OSELTAMIVIR PHOSPHATE 30 MG PO CAPS: 30.0000 mg | ORAL_CAPSULE | Freq: Two times a day (BID) | ORAL | Status: DC

## 2013-03-03 MED ORDER — PREDNISONE 50 MG PO TABS
60.0000 mg | ORAL_TABLET | Freq: Every day | ORAL | Status: DC
  Administered 2013-03-03 – 2013-03-04 (×2): 60 mg via ORAL
  Filled 2013-03-03 (×3): qty 1

## 2013-03-03 MED ORDER — ASPIRIN 81 MG PO CHEW: 81.0000 mg | CHEWABLE_TABLET | Freq: Every day | ORAL | Status: DC

## 2013-03-03 MED ORDER — PREDNISONE 20 MG PO TABS: 60.0000 mg | ORAL_TABLET | Freq: Every day | ORAL | Status: DC

## 2013-03-03 MED ORDER — FUROSEMIDE 10 MG/ML IJ SOLN: 40.0000 mg | Freq: Once | INTRAMUSCULAR | Status: DC

## 2013-03-03 MED ORDER — ASPIRIN 81 MG PO CHEW
81.0000 mg | CHEWABLE_TABLET | Freq: Every day | ORAL | Status: DC
  Administered 2013-03-03 – 2013-03-04 (×2): 81 mg via ORAL
  Filled 2013-03-03 (×2): qty 1

## 2013-03-03 MED ORDER — FUROSEMIDE 40 MG PO TABS
40.0000 mg | ORAL_TABLET | Freq: Two times a day (BID) | ORAL | Status: AC
  Administered 2013-03-03 (×2): 40 mg via ORAL
  Filled 2013-03-03 (×2): qty 1
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
## 2013-03-03 NOTE — Progress Notes (Signed)
Called by psychiatrist who stated patient does have capacity.    Aundra Dubin MD

## 2013-03-03 NOTE — Progress Notes (Signed)
Reached patient's daughter, Dale Mcfarland, at work [phone], ext [phone]; [phone]). She was not surprised to learn her father is in hospital again. She says she tried to help him find housing last year (through [place], also SNF, transitional home) but he refused all options. She apparently convinced him to undergo rehab at [facility] for a brief period last year when he was too weak to walk. He was assessed by [facility] at that time and although they said he was depressed he did not meet other criteria for their program. She also tried to help him with follow-up at [place] but he refused to follow-up.   She says he has never had a formal psychiatric evaluation and she strongly believe he is in need of psychiatric care. She notes that patient's brother had schizophrenia and she also notes patient's "manic" or "bipolar" behaviors including reckless spending streaks when he gives away or spends his whole social security check in a couple of days. She tried to help him manage his money in recent years but he was uncooperative. He apparently has periods where he is "calm", even for years at a time, but then something "flips" and he ends up evicted and living on the street- she says this has happened repeatedly.   Ms. Lucchese expresses concern about her father's physical health. She is willing to provide input if he is found to lack capacity but if he has capacity to make decisions she is uncertain of how she can help him. She would appreciate it if we would keep her informed as more information is available.  Maree Krabbe, MS3

## 2013-03-03 NOTE — Care Management Note (Signed)
1627 03-03-13 CM did provide Staff RN bus pass to give to pt before d/c. No further needs from CM at this time. Bethena Roys, RN,BSN [phone]

## 2013-03-03 NOTE — Progress Notes (Signed)
On call MD made aware of pt rhythm change. Pt is afib hr 120-130. Pt resting in bed will continue to monitor.

## 2013-03-03 NOTE — Progress Notes (Signed)
MD on call made aware of rhythm change. Pt is not in SR hr 80's. Will continue to monitor.

## 2013-03-03 NOTE — Progress Notes (Signed)
  Date: 03/03/2013  Patient name: Dale Mcfarland  Medical record number: [id number]  Date of birth: 05/01/1929   This patient has been seen and the plan of care was discussed with the house staff. Please see their note for complete details. I concur with their findings with the following additions/corrections: He has noted episode of Afib with RVR last night. This is a difficult case. He needs long term anticoagulation but he is resistant outpatient follow up. He likely has episodes of Afib with RVR that cause HF increasing his SOB. Ideally, he needs treatment with BB, diuretic, and coumadin/NOAC agent but follow up is uncertain. He at least needs ASA daily. Today he has notable evidence of crackles on exam, give 80 mg Lasix PO x 1. He likely has COPD, continue neb tx and start prednisone 60 mg po x 5 days. Continue Tamiflu x 5 days. Need mental capacity evaluation. If he has none, then his daughter will need to help him with his therapy.   Dominic Pea, DO, [facility] 03/03/2013, 4:18 PM

## 2013-03-03 NOTE — Progress Notes (Signed)
  I have seen and examined the patient, and reviewed the daily progress note by Riley, MS 3 and discussed the care of the patient with them. Please see my progress note from 03/03/2013 for further details regarding assessment and plan.    Signed:  Lesly Dukes, MD 03/03/2013, 12:27 PM

## 2013-03-04 DIAGNOSIS — Z9119 Patient's noncompliance with other medical treatment and regimen: Secondary | ICD-10-CM

## 2013-03-04 DIAGNOSIS — Z91199 Patient's noncompliance with other medical treatment and regimen due to unspecified reason: Secondary | ICD-10-CM

## 2013-03-04 LAB — GLUCOSE, CAPILLARY
GLUCOSE-CAPILLARY: 134 mg/dL — AB (ref 70–99)
Glucose-Capillary: 99 mg/dL (ref 70–99)

## 2013-03-04 MED ORDER — OMEPRAZOLE 40 MG PO CPDR: 40.0000 mg | DELAYED_RELEASE_CAPSULE | Freq: Every day | ORAL | Status: DC

## 2013-03-04 MED ORDER — FUROSEMIDE 40 MG PO TABS
40.0000 mg | ORAL_TABLET | Freq: Once | ORAL | Status: AC
  Administered 2013-03-04: 40 mg via ORAL
  Filled 2013-03-04: qty 1

## 2013-03-04 NOTE — Discharge Instructions (Signed)
It was a pleasure taking care of you! -Please follow up at the [facility] and [facility] as scheduled above. -Additionally, we strongly recommend that you [facility] so that you can use your VA benefits to assist with housing, medical care, etc. -Your heart was beating in an irregular rhythym at times during your hospital stay. We have started a daily baby aspirin to help protect you from strokes. Please take this each day. -We started a steroid (Prednisone) to help with the inflammation in your lungs. You should take this medication for 5 days.  -Please continue the flu medication (Tamiflu) for an additional 2 days. -A thickening or mass of one of your heart valves was noted during your heart ultrasound. We recommend that you see a cardiologist who can assess this.  -We also suggest that you see a cardiologist every 6-12 months for continued monitoring of your abdominal aortic aneurysm. -If you develop worsening of your symptoms, please make an appointment with the clinic or go to the ED.   Heart Failure Heart failure is a condition in which the heart has trouble pumping blood. This means your heart does not pump blood efficiently for your body to work well. In some cases of heart failure, fluid may back up into your lungs or you may have swelling (edema) in your lower legs. Heart failure is usually a long-term (chronic) condition. It is important for you to take good care of yourself and follow your caregiver's treatment plan. CAUSES  Some health conditions can cause heart failure. Those health conditions include:  High blood pressure (hypertension) causes the heart muscle to work harder than normal. When pressure in the blood vessels is high, the heart needs to pump (contract) with more force in order to circulate blood throughout the body. High blood pressure eventually causes the heart to become stiff and weak.  Coronary artery disease (CAD) is the buildup of  cholesterol and fat (plaque) in the arteries of the heart. The blockage in the arteries deprives the heart muscle of oxygen and blood. This can cause chest pain and may lead to a heart attack. High blood pressure can also contribute to CAD.  Heart attack (myocardial infarction) occurs when 1 or more arteries in the heart become blocked. The loss of oxygen damages the muscle tissue of the heart. When this happens, part of the heart muscle dies. The injured tissue does not contract as well and weakens the heart's ability to pump blood.  Abnormal heart valves can cause heart failure when the heart valves do not open and close properly. This makes the heart muscle pump harder to keep the blood flowing.  Heart muscle disease (cardiomyopathy or myocarditis) is damage to the heart muscle from a variety of causes. These can include drug or alcohol abuse, infections, or unknown reasons. These can increase the risk of heart failure.  Lung disease makes the heart work harder because the lungs do not work properly. This can cause a strain on the heart, leading it to fail.  Diabetes increases the risk of heart failure. High blood sugar contributes to high fat (lipid) levels in the blood. Diabetes can also cause slow damage to tiny blood vessels that carry important nutrients to the heart muscle. When the heart does not get enough oxygen and food, it can cause the heart to become weak and stiff. This leads to a heart that does not contract efficiently.  Other conditions can contribute to heart failure. These include  abnormal heart rhythms, thyroid problems, and low blood counts (anemia). Certain unhealthy behaviors can increase the risk of heart failure. Those unhealthy behaviors include:  Being overweight.  Smoking or chewing tobacco.  Eating foods high in fat and cholesterol.  Abusing illicit drugs or alcohol.  Lacking physical activity. SYMPTOMS  Heart failure symptoms may vary and can be hard to  detect. Symptoms may include:  Shortness of breath with activity, such as climbing stairs.  Persistent cough.  Swelling of the feet, ankles, legs, or abdomen.  Unexplained weight gain.  Difficulty breathing when lying flat (orthopnea).  Waking from sleep because of the need to sit up and get more air.  Rapid heartbeat.  Fatigue and loss of energy.  Feeling lightheaded, dizzy, or close to fainting.  Loss of appetite.  Nausea.  Increased urination during the night (nocturia). DIAGNOSIS  A diagnosis of heart failure is based on your history, symptoms, physical examination, and diagnostic tests. Diagnostic tests for heart failure may include:  Echocardiography.  Electrocardiography.  Chest X-ray.  Blood tests.  Exercise stress test.  Cardiac angiography.  Radionuclide scans. TREATMENT  Treatment is aimed at managing the symptoms of heart failure. Medicines, behavioral changes, or surgical intervention may be necessary to treat heart failure.  Medicines to help treat heart failure may include:  Angiotensin-converting enzyme (ACE) inhibitors. This type of medicine blocks the effects of a blood protein called angiotensin-converting enzyme. ACE inhibitors relax (dilate) the blood vessels and help lower blood pressure.  Angiotensin receptor blockers. This type of medicine blocks the actions of a blood protein called angiotensin. Angiotensin receptor blockers dilate the blood vessels and help lower blood pressure.  Water pills (diuretics). Diuretics cause the kidneys to remove salt and water from the blood. The extra fluid is removed through urination. This loss of extra fluid lowers the volume of blood the heart pumps.  Beta blockers. These prevent the heart from beating too fast and improve heart muscle strength.  Digitalis. This increases the force of the heartbeat.  Healthy behavior changes include:  Obtaining and maintaining a healthy weight.  Stopping smoking  or chewing tobacco.  Eating heart healthy foods.  Limiting or avoiding alcohol.  Stopping illicit drug use.  Physical activity as directed by your caregiver.  Surgical treatment for heart failure may include:  A procedure to open blocked arteries, repair damaged heart valves, or remove damaged heart muscle tissue.  A pacemaker to improve heart muscle function and control certain abnormal heart rhythms.  An internal cardioverter defibrillator to treat certain serious abnormal heart rhythms.  A left ventricular assist device to assist the pumping ability of the heart. HOME CARE INSTRUCTIONS   Take your medicine as directed by your caregiver. Medicines are important in reducing the workload of your heart, slowing the progression of heart failure, and improving your symptoms.  Do not stop taking your medicine unless directed by your caregiver.  Do not skip any dose of medicine.  Refill your prescriptions before you run out of medicine. Your medicines are needed every day.  Take over-the-counter medicine only as directed by your caregiver or pharmacist.  Engage in moderate physical activity if directed by your caregiver. Moderate physical activity can benefit some people. The elderly and people with severe heart failure should consult with a caregiver for physical activity recommendations.  Eat heart healthy foods. Food choices should be free of trans fat and low in saturated fat, cholesterol, and salt (sodium). Healthy choices include fresh or frozen fruits and vegetables, fish,  lean meats, legumes, fat-free or low-fat dairy products, and whole grain or high fiber foods. Talk to a dietitian to learn more about heart healthy foods.  Limit sodium if directed by your caregiver. Sodium restriction may reduce symptoms of heart failure in some people. Talk to a dietitian to learn more about heart healthy seasonings.  Use healthy cooking methods. Healthy cooking methods include roasting,  grilling, broiling, baking, poaching, steaming, or stir-frying. Talk to a dietitian to learn more about healthy cooking methods.  Limit fluids if directed by your caregiver. Fluid restriction may reduce symptoms of heart failure in some people.  Weigh yourself every day. Daily weights are important in the early recognition of excess fluid. You should weigh yourself every morning after you urinate and before you eat breakfast. Wear the same amount of clothing each time you weigh yourself. Record your daily weight. Provide your caregiver with your weight record.  Monitor and record your blood pressure if directed by your caregiver.  Check your pulse if directed by your caregiver.  Lose weight if directed by your caregiver. Weight loss may reduce symptoms of heart failure in some people.  Stop smoking or chewing tobacco. Nicotine makes your heart work harder by causing your blood vessels to constrict. Do not use nicotine gum or patches before talking to your caregiver.  Schedule and attend follow-up visits as directed by your caregiver. It is important to keep all your appointments.  Limit alcohol intake to no more than 1 drink per day for nonpregnant women and 2 drinks per day for men. Drinking more than that is harmful to your heart. Tell your caregiver if you drink alcohol several times a week. Talk with your caregiver about whether alcohol is safe for you. If your heart has already been damaged by alcohol or you have severe heart failure, drinking alcohol should be stopped completely.  Stop illicit drug use.  Stay up-to-date with immunizations. It is especially important to prevent respiratory infections through current pneumococcal and influenza immunizations.  Manage other health conditions such as hypertension, diabetes, thyroid disease, or abnormal heart rhythms as directed by your caregiver.  Learn to manage stress.  Plan rest periods when fatigued.  Learn strategies to manage high  temperatures. If the weather is extremely hot:  Avoid vigorous physical activity.  Use air conditioning or fans or seek a cooler location.  Avoid caffeine and alcohol.  Wear loose-fitting, lightweight, and light-colored clothing.  Learn strategies to manage cold temperatures. If the weather is extremely cold:  Avoid vigorous physical activity.  Layer clothes.  Wear mittens or gloves, a hat, and a scarf when going outside.  Avoid alcohol.  Obtain ongoing education and support as needed.  Participate or seek rehabilitation as needed to maintain or improve independence and quality of life. SEEK MEDICAL CARE IF:   Your weight increases by 03 lb/1.4 kg in 1 day or 05 lb/2.3 kg in a week.  You have increasing shortness of breath that is unusual for you.  You are unable to participate in your usual physical activities.  You tire easily.  You cough more than normal, especially with physical activity.  You have any or more swelling in areas such as your hands, feet, ankles, or abdomen.  You are unable to sleep because it is hard to breathe.  You feel like your heart is beating fast (palpitations).  You become dizzy or lightheaded upon standing up. SEEK IMMEDIATE MEDICAL CARE IF:   You have difficulty breathing.  There is  a change in mental status such as decreased alertness or difficulty with concentration.  You have a pain or discomfort in your chest.  You have an episode of fainting (syncope). MAKE SURE YOU:   Understand these instructions.  Will watch your condition.  Will get help right away if you are not doing well or get worse. Document Released: 01/28/2005 Document Revised: 05/25/2012 Document Reviewed: 02/20/2012 [facility] Patient Information 2014 [place], [place].  Smoking Cessation Quitting smoking is important to your health and has many advantages. However, it is not always easy to quit since nicotine is a very addictive drug. Often times, people try 3  times or more before being able to quit. This document explains the best ways for you to prepare to quit smoking. Quitting takes hard work and a lot of effort, but you can do it. ADVANTAGES OF QUITTING SMOKING  You will live longer, feel better, and live better.  Your body will feel the impact of quitting smoking almost immediately.  Within 20 minutes, blood pressure decreases. Your pulse returns to its normal level.  After 8 hours, carbon monoxide levels in the blood return to normal. Your oxygen level increases.  After 24 hours, the chance of having a heart attack starts to decrease. Your breath, hair, and body stop smelling like smoke.  After 48 hours, damaged nerve endings begin to recover. Your sense of taste and smell improve.  After 72 hours, the body is virtually free of nicotine. Your bronchial tubes relax and breathing becomes easier.  After 2 to 12 weeks, lungs can hold more air. Exercise becomes easier and circulation improves.  The risk of having a heart attack, stroke, cancer, or lung disease is greatly reduced.  After 1 year, the risk of coronary heart disease is cut in half.  After 5 years, the risk of stroke falls to the same as a nonsmoker.  After 10 years, the risk of lung cancer is cut in half and the risk of other cancers decreases significantly.  After 15 years, the risk of coronary heart disease drops, usually to the level of a nonsmoker.  If you are pregnant, quitting smoking will improve your chances of having a healthy baby.  The people you live with, especially any children, will be healthier.  You will have extra money to spend on things other than cigarettes. QUESTIONS TO THINK ABOUT BEFORE ATTEMPTING TO QUIT You may want to talk about your answers with your caregiver.  Why do you want to quit?  If you tried to quit in the past, what helped and what did not?  What will be the most difficult situations for you after you quit? How will you plan to  handle them?  Who can help you through the tough times? Your family? Friends? A caregiver?  What pleasures do you get from smoking? What ways can you still get pleasure if you quit? Here are some questions to ask your caregiver:  How can you help me to be successful at quitting?  What medicine do you think would be best for me and how should I take it?  What should I do if I need more help?  What is smoking withdrawal like? How can I get information on withdrawal? GET READY  Set a quit date.  Change your environment by getting rid of all cigarettes, ashtrays, matches, and lighters in your home, car, or work. Do not let people smoke in your home.  Review your past attempts to quit. Think about what  worked and what did not. GET SUPPORT AND ENCOURAGEMENT You have a better chance of being successful if you have help. You can get support in many ways.  Tell your family, friends, and co-workers that you are going to quit and need their support. Ask them not to smoke around you.  Get individual, group, or telephone counseling and support. Programs are available at [employer] and health centers. Call your local health department for information about programs in your area.  Spiritual beliefs and practices may help some smokers quit.  Download a "quit meter" on your computer to keep track of quit statistics, such as how long you have gone without smoking, cigarettes not smoked, and money saved.  Get a self-help book about quitting smoking and staying off of tobacco. Nelson yourself from urges to smoke. Talk to someone, go for a walk, or occupy your time with a task.  Change your normal routine. Take a different route to work. Drink tea instead of coffee. Eat breakfast in a different place.  Reduce your stress. Take a hot bath, exercise, or read a book.  Plan something enjoyable to do every day. Reward yourself for not smoking.  Explore  interactive web-based programs that specialize in helping you quit. GET MEDICINE AND USE IT CORRECTLY Medicines can help you stop smoking and decrease the urge to smoke. Combining medicine with the above behavioral methods and support can greatly increase your chances of successfully quitting smoking.  Nicotine replacement therapy helps deliver nicotine to your body without the negative effects and risks of smoking. Nicotine replacement therapy includes nicotine gum, lozenges, inhalers, nasal sprays, and skin patches. Some may be available over-the-counter and others require a prescription.  Antidepressant medicine helps people abstain from smoking, but how this works is unknown. This medicine is available by prescription.  Nicotinic receptor partial agonist medicine simulates the effect of nicotine in your brain. This medicine is available by prescription. Ask your caregiver for advice about which medicines to use and how to use them based on your health history. Your caregiver will tell you what side effects to look out for if you choose to be on a medicine or therapy. Carefully read the information on the package. Do not use any other product containing nicotine while using a nicotine replacement product.  RELAPSE OR DIFFICULT SITUATIONS Most relapses occur within the first 3 months after quitting. Do not be discouraged if you start smoking again. Remember, most people try several times before finally quitting. You may have symptoms of withdrawal because your body is used to nicotine. You may crave cigarettes, be irritable, feel very hungry, cough often, get headaches, or have difficulty concentrating. The withdrawal symptoms are only temporary. They are strongest when you first quit, but they will go away within 10 14 days. To reduce the chances of relapse, try to:  Avoid drinking alcohol. Drinking lowers your chances of successfully quitting.  Reduce the amount of caffeine you consume. Once you  quit smoking, the amount of caffeine in your body increases and can give you symptoms, such as a rapid heartbeat, sweating, and anxiety.  Avoid smokers because they can make you want to smoke.  Do not let weight gain distract you. Many smokers will gain weight when they quit, usually less than 10 pounds. Eat a healthy diet and stay active. You can always lose the weight gained after you quit.  Find ways to improve your mood other than smoking. FOR MORE INFORMATION  www.smokefree.gov  Document Released: 01/22/2001 Document Revised: 07/30/2011 Document Reviewed: 05/09/2011 [facility] Patient Information 2014 [place], [place].  Chronic Obstructive Pulmonary Disease Chronic obstructive pulmonary disease (COPD) is a common lung condition in which airflow from the lungs is limited. COPD is a general term that can be used to describe many different lung problems that limit airflow, including both chronic bronchitis and emphysema. If you have COPD, your lung function will probably never return to normal, but there are measures you can take to improve lung function and make yourself feel better.  CAUSES   Smoking (common).   Exposure to secondhand smoke.   Genetic problems.  Chronic inflammatory lung diseases or recurrent infections. SYMPTOMS   Shortness of breath, especially with physical activity.   Deep, persistent (chronic) cough with a large amount of thick mucus.   Wheezing.   Rapid breaths (tachypnea).   Gray or bluish discoloration (cyanosis) of the skin, especially in fingers, toes, or lips.   Fatigue.   Weight loss.   Frequent infections or episodes when breathing symptoms become much worse (exacerbations).   Chest tightness. DIAGNOSIS  Your healthcare provider will take a medical history and perform a physical examination to make the initial diagnosis. Additional tests for COPD may include:   Lung (pulmonary) function tests.  Chest X-ray.  CT scan.  Blood  tests. TREATMENT  Treatment available to help you feel better when you have COPD include:   Inhaler and nebulizer medicines. These help manage the symptoms of COPD and make your breathing more comfortable  Supplemental oxygen. Supplemental oxygen is only helpful if you have a low oxygen level in your blood.   Exercise and physical activity. These are beneficial for nearly all people with COPD. Some people may also benefit from a pulmonary rehabilitation program. HOME CARE INSTRUCTIONS   Take all medicines (inhaled or pills) as directed by your health care provider.  Only take over-the-counter or prescription medicines for pain, fever, or discomfort as directed by your health care provider.   Avoid over-the-counter medicines or cough syrups that dry up your airway (such as antihistamines) and slow down the elimination of secretions unless instructed otherwise by your healthcare provider.   If you are a smoker, the most important thing that you can do is stop smoking. Continuing to smoke will cause further lung damage and breathing trouble. Ask your health care provider for help with quitting smoking. He or she can direct you to community resources or hospitals that provide support.  Avoid exposure to irritants such as smoke, chemicals, and fumes that aggravate your breathing.  Use oxygen therapy and pulmonary rehabilitation if directed by your health care provider. If you require home oxygen therapy, ask your healthcare provider whether you should purchase a pulse oximeter to measure your oxygen level at home.   Avoid contact with individuals who have a contagious illness.  Avoid extreme temperature and humidity changes.  Eat healthy foods. Eating smaller, more frequent meals and resting before meals may help you maintain your strength.  Stay active, but balance activity with periods of rest. Exercise and physical activity will help you maintain your ability to do things you want to  do.  Preventing infection and hospitalization is very important when you have COPD. Make sure to receive all the vaccines your health care provider recommends, especially the pneumococcal and influenza vaccines. Ask your healthcare provider whether you need a pneumonia vaccine.  Learn and use relaxation techniques to manage stress.  Learn and use controlled breathing techniques  as directed by your health care provider. Controlled breathing techniques include:   Pursed lip breathing. Start by breathing in (inhaling) through your nose for 1 second. Then, purse your lips as if you were going to whistle and breathe out (exhale) through the pursed lips for 2 seconds.   Diaphragmatic breathing. Start by putting one hand on your abdomen just above your waist. Inhale slowly through your nose. The hand on your abdomen should move out. Then purse your lips and exhale slowly. You should be able to feel the hand on your abdomen moving in as you exhale.   Learn and use controlled coughing to clear mucus from your lungs. Controlled coughing is a series of short, progressive coughs. The steps of controlled coughing are:  1. Lean your head slightly forward.  2. Breathe in deeply using diaphragmatic breathing.  3. Try to hold your breath for 3 seconds.  4. Keep your mouth slightly open while coughing twice.  5. Spit any mucus out into a tissue.  6. Rest and repeat the steps once or twice as needed. SEEK MEDICAL CARE IF:   You are coughing up more mucus than usual.   There is a change in the color or thickness of your mucus.   Your breathing is more labored than usual.   Your breathing is faster than usual.  SEEK IMMEDIATE MEDICAL CARE IF:   You have shortness of breath while you are resting.   You have shortness of breath that prevents you from:  Being able to talk.   Performing your usual physical activities.   You have chest pain lasting longer than 5 minutes.   Your skin  color is more cyanotic than usual.  You measure low oxygen saturations for longer than 5 minutes with a pulse oximeter. MAKE SURE YOU:   Understand these instructions.  Will watch your condition.  Will get help right away if you are not doing well or get worse. Document Released: 11/07/2004 Document Revised: 11/18/2012 Document Reviewed: 09/24/2012 [facility] Patient Information 2014 [place], [place].  Influenza, Adult Influenza ("the flu") is a viral infection of the respiratory tract. It occurs more often in winter months because people spend more time in close contact with one another. Influenza can make you feel very sick. Influenza easily spreads from person to person (contagious). CAUSES  Influenza is caused by a virus that infects the respiratory tract. You can catch the virus by breathing in droplets from an infected person's cough or sneeze. You can also catch the virus by touching something that was recently contaminated with the virus and then touching your mouth, nose, or eyes. SYMPTOMS  Symptoms typically last 4 to 10 days and may include:  Fever.  Chills.  Headache, body aches, and muscle aches.  Sore throat.  Chest discomfort and cough.  Poor appetite.  Weakness or feeling tired.  Dizziness.  Nausea or vomiting. DIAGNOSIS  Diagnosis of influenza is often made based on your history and a physical exam. A nose or throat swab test can be done to confirm the diagnosis. RISKS AND COMPLICATIONS You may be at risk for a more severe case of influenza if you smoke cigarettes, have diabetes, have chronic heart disease (such as heart failure) or lung disease (such as asthma), or if you have a weakened immune system. Elderly people and pregnant women are also at risk for more serious infections. The most common complication of influenza is a lung infection (pneumonia). Sometimes, this complication can require emergency medical care and  may be life-threatening. PREVENTION  An  annual influenza vaccination (flu shot) is the best way to avoid getting influenza. An annual flu shot is now routinely recommended for all adults in the [place] TREATMENT  In mild cases, influenza goes away on its own. Treatment is directed at relieving symptoms. For more severe cases, your caregiver may prescribe antiviral medicines to shorten the sickness. Antibiotic medicines are not effective, because the infection is caused by a virus, not by bacteria. HOME CARE INSTRUCTIONS  Only take over-the-counter or prescription medicines for pain, discomfort, or fever as directed by your caregiver.  Use a cool mist humidifier to make breathing easier.  Get plenty of rest until your temperature returns to normal. This usually takes 3 to 4 days.  Drink enough fluids to keep your urine clear or pale yellow.  Cover your mouth and nose when coughing or sneezing, and wash your hands well to avoid spreading the virus.  Stay home from work or school until your fever has been gone for at least 1 full day. SEEK MEDICAL CARE IF:   You have chest pain or a deep cough that worsens or produces more mucus.  You have nausea, vomiting, or diarrhea. SEEK IMMEDIATE MEDICAL CARE IF:   You have difficulty breathing, shortness of breath, or your skin or nails turn bluish.  You have severe neck pain or stiffness.  You have a severe headache, facial pain, or earache.  You have a worsening or recurring fever.  You have nausea or vomiting that cannot be controlled. MAKE SURE YOU:  Understand these instructions.  Will watch your condition.  Will get help right away if you are not doing well or get worse. Document Released: 01/26/2000 Document Revised: 07/30/2011 Document Reviewed: 04/29/2011 [facility] Patient Information 2014 [place], [place].

## 2013-03-04 NOTE — Progress Notes (Signed)
Patient refuses to go to a shelter. Refuses to try to live with family. His daughter does not want to take care of him. He states he wants to be independent and demands an apartment from [place]. Encouraged him to go to a shelter and continues to refuse. He is medically stable. He does not want to establish outside care as outpatient. He has mental capacity. CM assistance requested.
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
## 2013-03-04 NOTE — Progress Notes (Signed)
S: Called by nurse that patient was in AF on telemetry, rate 110-130s, BP 150s/80s. EKG showed irregularly irregular rhythm, ut no concerning ST/T wave changes. I saw the patient about 30 minutes later and he was in NSR on telemetry, confirmed by repeat 12-lead EKG. He says he feels a little tired and still with SOB, but denies chest pain, worsening symptoms.   O: PR 90 Physical Exam  Constitutional: He is oriented to person, place, and time and well-developed, well-nourished, and in no distress.  Lying comfortably in bed but saying he feels tired.  Cardiovascular: Normal rate, regular rhythm and normal heart sounds.  Exam reveals no gallop and no friction rub.   No murmur heard. Pulmonary/Chest: Effort normal. No respiratory distress. He has no wheezes.  Crackles at lung bases.  Neurological: He is alert and oriented to person, place, and time. GCS score is 15.   A/P: Patient has a history of AF with RVR. He was again largely asymptomatic this time. He converted back to NSR, rate 90s, on his own. CHA2DS2-VASc 4-5 (age, vascular history, history of HTN, history of diabetes), but he would be a poor anticoagulation candidate given history of falls, trauma, and compliance issues. In this patient, risks of AC outweigh benefits. Also there are no data suggesting that preventing episodes of PAF with any intervention (BB ,etc) reduces mortality. - Discontinue telemetry - ASA 81 mg - Will give Lasix 40mg  po x1 - Again encouraged outpatient follow up - Medically stable for discharge home    Lesly Dukes, MD  [email or web address] Pager # [phone] Office # [phone]
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
## 2013-03-04 NOTE — Progress Notes (Signed)
Physical Therapy Evaluation (addendum for G-codes)   03/09/2013 0941  PT G-Codes **NOT FOR INPATIENT CLASS**  Functional Assessment Tool Used clinical judgement  Functional Limitation Mobility: Walking and moving around  Mobility: Walking and Moving Around Current Status (D9242) CI  Mobility: Walking and Moving Around Goal Status (A8341) CI  Mobility: Walking and Moving Around Discharge Status [id number]) CI    03/04/2013 Barry Brunner, PT Pager: [phone]

## 2013-03-04 NOTE — Care Management (Signed)
1619 03-04-13 CM did have to call [employer] for assistance with pt being d/c. No further needs from CM at this time. Bethena Roys, [phone]
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
## 2013-03-04 NOTE — Discharge Summary (Signed)
  Date: 03/04/2013  Patient name: Dale Mcfarland  Medical record number: [id number]  Date of birth: 07-22-1929   This patient has been seen and the plan of care was discussed with the house staff. Please see their note for complete details. I concur with their findings and plan.  Dominic Pea, DO, [facility] 03/04/2013, 3:23 PM

## 2013-03-05 LAB — GLUCOSE, CAPILLARY: Glucose-Capillary: 141 mg/dL — ABNORMAL HIGH (ref 70–99)

## 2013-03-09 ENCOUNTER — Emergency Department (HOSPITAL_COMMUNITY): Payer: Medicare Other

## 2013-03-09 ENCOUNTER — Encounter (HOSPITAL_COMMUNITY): Payer: Self-pay | Admitting: Emergency Medicine

## 2013-03-09 ENCOUNTER — Emergency Department (HOSPITAL_COMMUNITY)
Admission: EM | Admit: 2013-03-09 | Discharge: 2013-03-10 | Disposition: A | Discharge status: Home / Self Care | Payer: Medicare Other | Attending: Emergency Medicine | Admitting: Emergency Medicine

## 2013-03-09 DIAGNOSIS — Z8781 Personal history of (healed) traumatic fracture: Secondary | ICD-10-CM | POA: Insufficient documentation

## 2013-03-09 DIAGNOSIS — Z880 Allergy status to penicillin: Secondary | ICD-10-CM | POA: Insufficient documentation

## 2013-03-09 DIAGNOSIS — M79606 Pain in leg, unspecified: Secondary | ICD-10-CM

## 2013-03-09 DIAGNOSIS — Z862 Personal history of diseases of the blood and blood-forming organs and certain disorders involving the immune mechanism: Secondary | ICD-10-CM | POA: Insufficient documentation

## 2013-03-09 DIAGNOSIS — F17200 Nicotine dependence, unspecified, uncomplicated: Secondary | ICD-10-CM | POA: Insufficient documentation

## 2013-03-09 DIAGNOSIS — Z5900 Homelessness unspecified: Secondary | ICD-10-CM | POA: Insufficient documentation

## 2013-03-09 DIAGNOSIS — J40 Bronchitis, not specified as acute or chronic: Secondary | ICD-10-CM

## 2013-03-09 DIAGNOSIS — I10 Essential (primary) hypertension: Secondary | ICD-10-CM | POA: Insufficient documentation

## 2013-03-09 DIAGNOSIS — M79609 Pain in unspecified limb: Secondary | ICD-10-CM | POA: Insufficient documentation

## 2013-03-09 DIAGNOSIS — Z79899 Other long term (current) drug therapy: Secondary | ICD-10-CM | POA: Insufficient documentation

## 2013-03-09 DIAGNOSIS — R109 Unspecified abdominal pain: Secondary | ICD-10-CM | POA: Insufficient documentation

## 2013-03-09 DIAGNOSIS — G8929 Other chronic pain: Secondary | ICD-10-CM | POA: Insufficient documentation

## 2013-03-09 DIAGNOSIS — Z8709 Personal history of other diseases of the respiratory system: Secondary | ICD-10-CM

## 2013-03-09 DIAGNOSIS — Z8701 Personal history of pneumonia (recurrent): Secondary | ICD-10-CM | POA: Insufficient documentation

## 2013-03-09 DIAGNOSIS — Z7982 Long term (current) use of aspirin: Secondary | ICD-10-CM | POA: Insufficient documentation

## 2013-03-09 DIAGNOSIS — R209 Unspecified disturbances of skin sensation: Secondary | ICD-10-CM | POA: Insufficient documentation

## 2013-03-09 DIAGNOSIS — Z8659 Personal history of other mental and behavioral disorders: Secondary | ICD-10-CM | POA: Insufficient documentation

## 2013-03-09 DIAGNOSIS — E119 Type 2 diabetes mellitus without complications: Secondary | ICD-10-CM | POA: Insufficient documentation

## 2013-03-09 DIAGNOSIS — J441 Chronic obstructive pulmonary disease with (acute) exacerbation: Secondary | ICD-10-CM | POA: Insufficient documentation

## 2013-03-09 LAB — BASIC METABOLIC PANEL
BUN: 16 mg/dL (ref 6–23)
CALCIUM: 8.5 mg/dL (ref 8.4–10.5)
CO2: 20 mEq/L (ref 19–32)
Chloride: 100 mEq/L (ref 96–112)
Creatinine, Ser: 0.83 mg/dL (ref 0.50–1.35)
GFR, EST NON AFRICAN AMERICAN: 80 mL/min — AB (ref >90)
Glucose, Bld: 91 mg/dL (ref 70–99)
Potassium: 4.2 mEq/L (ref 3.7–5.3)
SODIUM: 133 meq/L — AB (ref 137–147)

## 2013-03-09 LAB — CBC
HCT: 32.4 % — ABNORMAL LOW (ref 39.0–52.0)
HEMOGLOBIN: 9.8 g/dL — AB (ref 13.0–17.0)
MCH: 23.7 pg — ABNORMAL LOW (ref 26.0–34.0)
MCHC: 30.2 g/dL (ref 30.0–36.0)
MCV: 78.3 fL (ref 78.0–100.0)
PLATELETS: 446 10*3/uL — AB (ref 150–400)
RBC: 4.14 MIL/uL — AB (ref 4.22–5.81)
RDW: 16.1 % — ABNORMAL HIGH (ref 11.5–15.5)
WBC: 11.2 10*3/uL — AB (ref 4.0–10.5)

## 2013-03-09 MED ORDER — ALBUTEROL SULFATE (2.5 MG/3ML) 0.083% IN NEBU
2.5000 mg | INHALATION_SOLUTION | Freq: Once | RESPIRATORY_TRACT | Status: AC
  Administered 2013-03-09: 2.5 mg via RESPIRATORY_TRACT
  Filled 2013-03-09: qty 3

## 2013-03-09 MED ORDER — IPRATROPIUM-ALBUTEROL 0.5-2.5 (3) MG/3ML IN SOLN
3.0000 mL | Freq: Once | RESPIRATORY_TRACT | Status: AC
  Administered 2013-03-09: 3 mL via RESPIRATORY_TRACT
  Filled 2013-03-09: qty 3

## 2013-03-09 MED ORDER — IPRATROPIUM BROMIDE 0.02 % IN SOLN: 0.5000 mg | Freq: Once | RESPIRATORY_TRACT | Status: DC

## 2013-03-09 MED ORDER — ALBUTEROL SULFATE (2.5 MG/3ML) 0.083% IN NEBU: 5.0000 mg | INHALATION_SOLUTION | Freq: Once | RESPIRATORY_TRACT | Status: DC

## 2013-03-09 NOTE — ED Notes (Signed)
Pt states he is currently unable to urinate at this time. Pt given an urinal and states he will call out when he goes to the bathroom.  RN notified.

## 2013-03-09 NOTE — ED Provider Notes (Signed)
CSN: [id number]     Arrival date & time 03/09/13  1808 History   First MD Initiated Contact with Patient 03/09/13 2211     Chief Complaint  Patient presents with  . Leg Pain  . Shortness of Breath   (Consider location/radiation/quality/duration/timing/severity/associated sxs/prior Treatment) The history is provided by the patient. No language interpreter was used.  Dale Mcfarland is an 80 y/o M with PMHx of COPD, pneumonia, depression, hyponatremia, bronchitis, upper GI bleed presenting to the ED with bilateral leg pain, chest pain, shortness of breath. As per patient's report stated that he has been having bilateral leg pain for over a year - localized to the anterior aspect of the legs described as a throbbing sensation, stated that the pain is worse on the right than it is on the left. Reported intermittent numbness to the feet-patient reported "my feet are asleep right now." Patient reported that he has been passing out, reported that this has been ongoing for the past 10-12 years. Stated that he "passes out" for a couple of minutes and comes back to himself. Patient reported that he was seen by a neurologist and stated that he was diagnosed with a "quirk in my brain." Patient stated that he has been having cough mainly of phlegm. Patient reported that his "lungs hurt"-reported that he feels like his lungs are going to collapse. Denied shortness of breath, difficulty breathing, nausea, vomiting, diarrhea, abdominal pain, visual distortions, falls, injuries, leg swelling. PCP none  Past Medical History  Diagnosis Date  . COPD (chronic obstructive pulmonary disease)   . Chronic back pain   . Pneumonia     01/2011 - CAP vs aspiration pneuonmia  . Depression   . PONV (postoperative nausea and vomiting)   . Hyponatremia     Previously felt secondary to SIADH  . Hypertension   . Upper GI bleed     01/2011 with EGD showing severe candida esophagitis and duodenal bulb erosion  . Anemia     In  the setting of UGI bleed 01/2011 requiring blood transfusion  . Homelessness   . Dyspnea     Chronic, thought due to COPD. Echo 02/2010 with EF 30-35%, nuclear study negative, then repeat echo 03/2011 did not show systolic dysfxn, so unclear if truly HF  . Bronchitis   . Diabetes mellitus without complication   . Collar bone fracture     left   Past Surgical History  Procedure Laterality Date  . Tonsillectomy    . Vasectomy    . Appendectomy    . Tonsillectomy    . Colonoscopy    . Esophagogastroduodenoscopy    . Esophagogastroduodenoscopy  02/03/2011    Procedure: ESOPHAGOGASTRODUODENOSCOPY (EGD);  Surgeon: Juanita Craver, MD;  Location: [facility];  Service: Endoscopy;  Laterality: N/A;  . Esophagogastroduodenoscopy  02/03/2011    Procedure: ESOPHAGOGASTRODUODENOSCOPY (EGD);  Surgeon: Juanita Craver, MD;  Location: [facility];  Service: Endoscopy;  Laterality: N/A;  . Colonoscopy  01/30/2012    Procedure: COLONOSCOPY;  Surgeon: Ladene Artist, MD,FACG;  Location: [facility];  Service: Endoscopy;  Laterality: N/A;   Family History  Problem Relation Age of Onset  . Coronary artery disease Father     Starting in his 54's. Died of massive MI at age 28.  . Schizophrenia Brother   . Coronary artery disease Sister     MI at age 62  . Alzheimer's disease Mother    History  Substance Use Topics  . Smoking status: Current Some Day Smoker --  1.00 packs/day for 60 years    Types: Cigarettes  . Smokeless tobacco: Never Used     Comment: Since age 8  . Alcohol Use: Yes     Comment: 174 ml of scotch weekly    Review of Systems  Constitutional: Negative for fever and chills.  Respiratory: Negative for chest tightness.   Cardiovascular: Positive for chest pain.  Gastrointestinal: Positive for abdominal pain.  Neurological: Negative for dizziness and weakness.  All other systems reviewed and are negative.    Allergies  Benadryl and Penicillins  Home Medications   Current  Outpatient Rx  Name  Route  Sig  Dispense  Refill  . aspirin 81 MG chewable tablet   Oral   Chew 1 tablet (81 mg total) by mouth daily.   30 tablet   11   . omeprazole (PRILOSEC) 40 MG capsule   Oral   Take 1 capsule (40 mg total) by mouth daily.   60 capsule   11    BP 151/85  Pulse 70  Temp(Src) 97.9 F (36.6 C) (Oral)  Resp 23  SpO2 100% Physical Exam  Nursing note and vitals reviewed. Constitutional: He is oriented to person, place, and time. He appears well-developed and well-nourished. No distress.  HENT:  Head: Normocephalic and atraumatic.  Mouth/Throat: Oropharynx is clear and moist. No oropharyngeal exudate.  Eyes: Conjunctivae and EOM are normal. Pupils are equal, round, and reactive to light. Right eye exhibits no discharge. Left eye exhibits no discharge.  Neck: Normal range of motion. Neck supple.  Cardiovascular: Normal rate, regular rhythm and normal heart sounds.   Pulses:      Radial pulses are 2+ on the right side, and 2+ on the left side.       Dorsalis pedis pulses are 2+ on the right side, and 2+ on the left side.  Cap refill < 3 seconds Negative swelling or pitting edema noted to bilateral lower extremities Negative Homan's sign  Pulmonary/Chest: Effort normal. He has wheezes. He has rales.  Expiratory wheezes noted to the upper and lower lobes bilaterally.  Decreased breath sounds to upper and lower lobes bilaterally  Poor lung expansion  Musculoskeletal: Normal range of motion. He exhibits no tenderness.  Negative swelling, erythema, inflammation, lesions, sores noted to the legs bilaterally. Negative deformities. Negative pain upon palpation circumferentially. Full ROM to upper and lower extremities without difficulty noted, negative ataxia noted.  Neurological: He is alert and oriented to person, place, and time. He exhibits normal muscle tone. Coordination normal.  Cranial nerves III-XII grossly intact Strength 5+/5+ to upper and lower  extremities bilaterally with resistance applied, equal distribution noted Sensation intact to upper and lower extremities with differentiation to sharp and dull touch  Heel to knee down shin normal bilaterally Gait proper, proper balance - negative sway, negative drift, negative step-offs  Skin: Skin is warm and dry. No rash noted. He is not diaphoretic. No erythema.  Psychiatric: He has a normal mood and affect. His behavior is normal. Thought content normal.    ED Course  Procedures (including critical care time)  This provider reviewed patient's chart. Patient was seen and assessed in the emergency department on 03/01/2013 regarding CHF exacerbation or patient was admitted to hospital regarding this diagnosis.  2:19 AM Patient seen and assessed by Dr. Jacinto Reap. Opitz. Attending physician did not recommend any further work-up for the patient. As per attending physician, cleared patient for discharge and for patient to be discharged with inhaler and tessalon. Attending  reported that this visit is more so for a place to stay since patient is homeless and does not like to go to the shelters since they close at 8:00PM, patient chooses to sleep on the streets.   2:44 AM Upon discharge patient requesting sandwich. Sandwich and coffee administered to the patient. This provider had a long discussion with the patient regarding COPD and smoking consequences, as well as smoking cessation. Patient reported that he does not want to leave, he wants to stay in the ED because he has not where to go. Patient upset about being discharged, reported that he will continue to come back to the ED.  Results for orders placed during the hospital encounter of [id number]  BASIC METABOLIC PANEL      Result Value Range   Sodium 133 (*) 137 - 147 mEq/L   Potassium 4.2  3.7 - 5.3 mEq/L   Chloride 100  96 - 112 mEq/L   CO2 20  19 - 32 mEq/L   Glucose, Bld 91  70 - 99 mg/dL   BUN 16  6 - 23 mg/dL   Creatinine, Ser 0.83  0.50 -  1.35 mg/dL   Calcium 8.5  8.4 - 10.5 mg/dL   GFR calc non Af Amer 80 (*) >90 mL/min   GFR calc Af Amer >90  >90 mL/min  CBC      Result Value Range   WBC 11.2 (*) 4.0 - 10.5 K/uL   RBC 4.14 (*) 4.22 - 5.81 MIL/uL   Hemoglobin 9.8 (*) 13.0 - 17.0 g/dL   HCT 32.4 (*) 39.0 - 52.0 %   MCV 78.3  78.0 - 100.0 fL   MCH 23.7 (*) 26.0 - 34.0 pg   MCHC 30.2  30.0 - 36.0 g/dL   RDW 16.1 (*) 11.5 - 15.5 %   Platelets 446 (*) 150 - 400 K/uL  PRO B NATRIURETIC PEPTIDE      Result Value Range   Pro B Natriuretic peptide (BNP) 675.5 (*) 0 - 450 pg/mL  TROPONIN I      Result Value Range   Troponin I <0.30  <0.30 ng/mL  LIPASE, BLOOD      Result Value Range   Lipase 30  11 - 59 U/L  HEPATIC FUNCTION PANEL      Result Value Range   Total Protein 6.9  6.0 - 8.3 g/dL   Albumin 2.7 (*) 3.5 - 5.2 g/dL   AST 14  0 - 37 U/L   ALT 10  0 - 53 U/L   Alkaline Phosphatase 117  39 - 117 U/L   Total Bilirubin 0.3  0.3 - 1.2 mg/dL   Bilirubin, Direct <0.2  0.0 - 0.3 mg/dL   Indirect Bilirubin NOT CALCULATED  0.3 - 0.9 mg/dL   Dg Chest 2 View  03/09/2013   CLINICAL DATA:  Shortness of breath  EXAM: CHEST  2 VIEW  COMPARISON:  03/01/2013  FINDINGS: Stable cardiomegaly with vascular congestion and diffuse chronic interstitial changes throughout the lungs, suspect chronic interstitial lung disease. Atherosclerosis of the aorta. No significant interval change. No developing effusion or pneumothorax. No osseous abnormality. Resolved right fissure pleural fluid compared to the prior study.  IMPRESSION: Stable cardiomegaly and chronic interstitial changes.  Resolved right fissure pleural fluid  No definite new superimposed acute process.   Electronically Signed   By: Daryll Brod M.D.   On: 03/09/2013 23:47   Labs Review Labs Reviewed  BASIC METABOLIC PANEL - Abnormal; Notable for the  following:    Sodium 133 (*)    GFR calc non Af Amer 80 (*)    All other components within normal limits  CBC - Abnormal; Notable  for the following:    WBC 11.2 (*)    RBC 4.14 (*)    Hemoglobin 9.8 (*)    HCT 32.4 (*)    MCH 23.7 (*)    RDW 16.1 (*)    Platelets 446 (*)    All other components within normal limits  PRO B NATRIURETIC PEPTIDE - Abnormal; Notable for the following:    Pro B Natriuretic peptide (BNP) 675.5 (*)    All other components within normal limits  HEPATIC FUNCTION PANEL - Abnormal; Notable for the following:    Albumin 2.7 (*)    All other components within normal limits  TROPONIN I  LIPASE, BLOOD  URINALYSIS, ROUTINE W REFLEX MICROSCOPIC   Imaging Review Dg Chest 2 View  03/09/2013   CLINICAL DATA:  Shortness of breath  EXAM: CHEST  2 VIEW  COMPARISON:  03/01/2013  FINDINGS: Stable cardiomegaly with vascular congestion and diffuse chronic interstitial changes throughout the lungs, suspect chronic interstitial lung disease. Atherosclerosis of the aorta. No significant interval change. No developing effusion or pneumothorax. No osseous abnormality. Resolved right fissure pleural fluid compared to the prior study.  IMPRESSION: Stable cardiomegaly and chronic interstitial changes.  Resolved right fissure pleural fluid  No definite new superimposed acute process.   Electronically Signed   By: Daryll Brod M.D.   On: 03/09/2013 23:47    EKG Interpretation    Date/Time:  Tuesday March 09 2013 23:30:30 EST Ventricular Rate:  71 PR Interval:  198 QRS Duration: 94 QT Interval:  452 QTC Calculation: 491 R Axis:   61 Text Interpretation:  Sinus rhythm Multiple premature complexes, vent  Probable anteroseptal infarct, old No significant change since last tracing Confirmed by GOLDSTON  MD, SCOTT ([id number]) on 03/09/2013 11:34:54 PM            MDM   1. Bronchitis   2. Chronic leg pain   3. History of COPD   4. Smoker     Medications  albuterol (PROVENTIL HFA;VENTOLIN HFA) 108 (90 BASE) MCG/ACT inhaler 2 puff (not administered)  benzonatate (TESSALON) capsule 100 mg (not administered)   albuterol (PROVENTIL) (2.5 MG/3ML) 0.083% nebulizer solution 2.5 mg (2.5 mg Nebulization Given 03/09/13 2350)  ipratropium-albuterol (DUONEB) 0.5-2.5 (3) MG/3ML nebulizer solution 3 mL (3 mLs Nebulization Given 03/09/13 2350)   Filed Vitals:   03/09/13 1814 03/09/13 2317 03/09/13 2328 03/10/13 0000  BP: 143/69 151/85 151/85   Pulse: 57 67 71 70  Temp: 97.9 F (36.6 C)     TempSrc: Oral     Resp: 17  18 23   SpO2: 98% 97% 97% 100%    Patient presenting to emergency problem with multiple complaints. Patient reported that he's been having bilateral leg pain that has been ongoing for over a year localized to the anterior aspects of the leg describes a constant throbbing sensation with intermittent numbness to the feet. Patient reported that he's been having episodes of "passing out" that has been ongoing for approximately 10-12 years. Stated that these episodes last a couple of minutes. Stated that he's been having continuous cough mainly of phlegm-patient reported he has chronic bronchitis-continues to smoke approximately 1-2 packs of cigarettes per day. Alert and oriented. GCS 15. Heart rate and rhythm normal. Lungs with expiratory wheezes to upper and lower lobes bilaterally, decreased breath sounds.  Poor lung expansion, noted to be decreased. Cap refill less than 3 seconds. Radial and DP pulses 2+ bilaterally. Bowel sounds normoactive in all 4 quadrants. Negative pain upon palpation-negative pain once patient is distracted. Negative swelling or pitting edema noted to lower extremities bilaterally. Full range of motion to upper and lower extremities bilaterally without difficulty or ataxia noted. Strength intact to the digits of the feet bilaterally. Sensation intact with differentiation to sharp and dull touch. Negative focal neurological deficits noted. Gait proper.  EKG noted normal sinus rhythm with multiple premature complexes with a heart rate of 71 beats per minute. Negative ischemic findings  noted, negative new findings noted. First troponin negative elevation. CBC noted elevation white blood cell of 11.2. BMP noted mild hyponatremia of 133 - sodium level has decreased when compared to one week ago when sodium level was 138. Lipase negative elevation. Hepatic function panel noted low albumin, and 2.7. BNP measured to be 675.5 - BNP has actually improved compared to one week ago when the level was approximately 1679.0. Chest x-ray noted stable cardiomegaly and chronic interstitial changes-resolved right fissure pleural fluid - negative acute processes identified. Doubt ACS. Negative findings for pneumonia. Leg pain is a chronic issue. Suspicion to be bronchitis - patient continues to smoke cigarettes which is not helping his COPD. Doubt CHF exacerbation - patient's BNP has improved and negative signs of peripheral edema. Sensation intact, strength intact - negative focal neurological deficits noted. Doubt COPD exacerbation. Patient ambulated well in ED setting with pulses dropping to 89%, but then increasing to 96% when ambulating. Patient is homeless and chooses to not sleep in the shelters secondary to the shelters closing at 8:00PM. Patient seen and assessed by attending physician who cleared patient for discharge and recommended that no more labs/imaging be performed for this patient. Patient stable, afebrile. Negative acute findings noted - all chronic issues. This provider had a long discussion with patient regarding smoking and COPD, as well as smoking cessation. Upon discharging the patient, the patient reported that he is going to keep coming back to the hospital. Patient demanding to be fed - patient given sandwich and coffee. Patient discharged. Discharged patient with albuterol inhaler. [facility]. Discussed with patient to rest and stay hydrated. Discussed with patient to closely monitor symptoms and if symptoms are to worsen or change to report back to the ED -  strict return instructions given.  Patient agreed to plan of care, understood, all questions answered.   Jamse Mead, PA-C 03/10/13 1052

## 2013-03-09 NOTE — ED Notes (Signed)
RT paged at this time for neb treatment

## 2013-03-09 NOTE — ED Notes (Signed)
Per EMS comes from side of the road bc pt is homeless c/o bilat leg pain and shob due to bronchitis.  Pt was seen at [facility] ED yesterday for same thing and didn't ike what he was told there and wants second opinion.

## 2013-03-10 LAB — URINALYSIS, ROUTINE W REFLEX MICROSCOPIC
Bilirubin Urine: NEGATIVE
Glucose, UA: NEGATIVE mg/dL
Hgb urine dipstick: NEGATIVE
Ketones, ur: NEGATIVE mg/dL
LEUKOCYTES UA: NEGATIVE
NITRITE: NEGATIVE
PROTEIN: NEGATIVE mg/dL
SPECIFIC GRAVITY, URINE: 1.018 (ref 1.005–1.030)
UROBILINOGEN UA: 1 mg/dL (ref 0.0–1.0)
pH: 5.5 (ref 5.0–8.0)

## 2013-03-10 LAB — HEPATIC FUNCTION PANEL
ALT: 10 U/L (ref 0–53)
AST: 14 U/L (ref 0–37)
Albumin: 2.7 g/dL — ABNORMAL LOW (ref 3.5–5.2)
Alkaline Phosphatase: 117 U/L (ref 39–117)
Bilirubin, Direct: <0.2 mg/dL (ref 0.0–0.3)
TOTAL PROTEIN: 6.9 g/dL (ref 6.0–8.3)
Total Bilirubin: 0.3 mg/dL (ref 0.3–1.2)

## 2013-03-10 LAB — PRO B NATRIURETIC PEPTIDE: PRO B NATRI PEPTIDE: 675.5 pg/mL — AB (ref 0–450)

## 2013-03-10 LAB — TROPONIN I

## 2013-03-10 LAB — LIPASE, BLOOD: LIPASE: 30 U/L (ref 11–59)

## 2013-03-10 MED ORDER — BENZONATATE 100 MG PO CAPS
100.0000 mg | ORAL_CAPSULE | Freq: Once | ORAL | Status: AC
  Administered 2013-03-10: 100 mg via ORAL
  Filled 2013-03-10: qty 1

## 2013-03-10 MED ORDER — ALBUTEROL SULFATE HFA 108 (90 BASE) MCG/ACT IN AERS
2.0000 | INHALATION_SPRAY | Freq: Once | RESPIRATORY_TRACT | Status: AC
Start: 2013-03-10 — End: 2013-03-10
  Administered 2013-03-10: 2 via RESPIRATORY_TRACT
  Filled 2013-03-10: qty 6.7

## 2013-03-10 NOTE — Discharge Instructions (Signed)
Please rest and stay hydrated Please use inhaler as needed for shortness of breath Please avoid any physical or strenuous activity Please continue to monitor symptoms and if symptoms are to worsen or change (fever greater than 101, chills, neck pain, neck stiffness, chest pain, shortness of breath, difficulty breathing, abdominal pain, nausea, vomiting, diarrhea, worsenign or changes to leg pain, numbness, tingling, loss of sensation, fall, injury) please report back to the ED   Bronchitis Bronchitis is inflammation of the airways that extend from the windpipe into the lungs (bronchi). The inflammation often causes mucus to develop, which leads to a cough. If the inflammation becomes severe, it may cause shortness of breath. CAUSES  Bronchitis may be caused by:   Viral infections.   Bacteria.   Cigarette smoke.   Allergens, pollutants, and other irritants.  SIGNS AND SYMPTOMS  The most common symptom of bronchitis is a frequent cough that produces mucus. Other symptoms include:  Fever.   Body aches.   Chest congestion.   Chills.   Shortness of breath.   Sore throat.  DIAGNOSIS  Bronchitis is usually diagnosed through a medical history and physical exam. Tests, such as chest X-rays, are sometimes done to rule out other conditions.  TREATMENT  You may need to avoid contact with whatever caused the problem (smoking, for example). Medicines are sometimes needed. These may include:  Antibiotics. These may be prescribed if the condition is caused by bacteria.  Cough suppressants. These may be prescribed for relief of cough symptoms.   Inhaled medicines. These may be prescribed to help open your airways and make it easier for you to breathe.   Steroid medicines. These may be prescribed for those with recurrent (chronic) bronchitis. HOME CARE INSTRUCTIONS  Get plenty of rest.   Drink enough fluids to keep your urine clear or pale yellow (unless you have a medical  condition that requires fluid restriction). Increasing fluids may help thin your secretions and will prevent dehydration.   Only take over-the-counter or prescription medicines as directed by your health care provider.  Only take antibiotics as directed. Make sure you finish them even if you start to feel better.  Avoid secondhand smoke, irritating chemicals, and strong fumes. These will make bronchitis worse. If you are a smoker, quit smoking. Consider using nicotine gum or skin patches to help control withdrawal symptoms. Quitting smoking will help your lungs heal faster.   Put a cool-mist humidifier in your bedroom at night to moisten the air. This may help loosen mucus. Change the water in the humidifier daily. You can also run the hot water in your shower and sit in the bathroom with the door closed for 5 10 minutes.   Follow up with your health care provider as directed.   Wash your hands frequently to avoid catching bronchitis again or spreading an infection to others.  SEEK MEDICAL CARE IF: Your symptoms do not improve after 1 week of treatment.  SEEK IMMEDIATE MEDICAL CARE IF:  Your fever increases.  You have chills.   You have chest pain.   You have worsening shortness of breath.   You have bloody sputum.  You faint.  You have lightheadedness.  You have a severe headache.   You vomit repeatedly. MAKE SURE YOU:   Understand these instructions.  Will watch your condition.  Will get help right away if you are not doing well or get worse. Document Released: 01/28/2005 Document Revised: 11/18/2012 Document Reviewed: 09/22/2012 [facility] Patient Information 2014 [place].  Emergency Department Resource Guide 1) Find a Doctor and Pay Out of Pocket Although you won't have to find out who is covered by your insurance plan, it is a good idea to ask around and get recommendations. You will then need to call the office and see if the doctor you have  chosen will accept you as a new patient and what types of options they offer for patients who are self-pay. Some doctors offer discounts or will set up payment plans for their patients who do not have insurance, but you will need to ask so you aren't surprised when you get to your appointment.  2) Contact Your Local Health Department Not all health departments have doctors that can see patients for sick visits, but many do, so it is worth a call to see if yours does. If you don't know where your local health department is, you can check in your phone book. The CDC also has a tool to help you locate your state's health department, and many state websites also have listings of all of their local health departments.  3) Find a [facility] If your illness is not likely to be very severe or complicated, you may want to try a walk in clinic. These are popping up all over the country in pharmacies, drugstores, and shopping centers. They're usually staffed by nurse practitioners or physician assistants that have been trained to treat common illnesses and complaints. They're usually fairly quick and inexpensive. However, if you have serious medical issues or chronic medical problems, these are probably not your best option.  No Primary Care Doctor: - Call Health Connect at  [phone] - they can help you locate a primary care doctor that  accepts your insurance, provides certain services, etc. - Physician Referral Service- [phone]  Chronic Pain Problems: Organization         Address  Phone   Notes  [facility]  [phone] Patients need to be referred by their primary care doctor.   Medication Assistance: Organization         Address  Phone   Notes  [facility] Medication [facility] [address] [place], [place] [address] [phone] --Must be a resident of [facility] -- Must have NO insurance coverage whatsoever (no Medicaid/ Medicare,  etc.) -- The pt. MUST have a primary care doctor that directs their care regularly and follows them in the community   MedAssist  [phone]   [employer]  [phone]    Agencies that provide inexpensive medical care: Organization         Address  Phone   Notes  [place]  [phone]   [facility]    [phone]   [facility] [place], [place] [address] [phone]   [place] [address] [place] [phone]   Planned Parenthood    [phone]   [facility]    [phone]   [place] and [address], [place] Phone:  [phone], Fax:  [phone] Hours of Operation:  9 am - 6 pm, M-F.  Also accepts Medicaid/Medicare and self-pay.  [facility] for [address], [address], [place] Phone: [phone], Fax: [phone]. Hours of Operation:  8:30 am - 5:30 pm, M-F.  Also accepts Medicaid and self-pay.  [facility]  [address], [employer] Phone: [phone]   [place], [place], [place] [phone], Ext. [phone] Mondays & Thursdays: 7-9 AM.  First 15 patients are seen on a first come, first serve basis.    [place] Providers:  Organization         Address  Phone   Notes  [facility] [address], [place] [phone] Also accepts self-pay patients.  [facility] [address], [place]  [phone]   [place], [address], [place] [phone]   [facility] [address] [place] [phone]   Lucianne Lei [address], [place]   [phone] Only accepts [place] Access [place] patients after they have their name applied to their card.   Self-Pay (no  insurance) in [facility]:  Organization         Address  Phone   Notes  Sickle Cell Patients, [facility] [place] [phone]   [facility] [place] [phone]   [facility] [place]  [place], [place], [place] [phone]   [facility]/Dr. Osei-Bonsu  [address], [place] or [address], [place] [phone] Phone number for both [place] and [place] locations is the same.  Urgent Medical and [facility] [address], [place] [phone]   [facility] [address] [place] or [address] [phone]   [facility] [address] [place] [phone], phone; [phone], fax Sees patients 1st and 3rd Saturday of every month.  Must not qualify for public or private insurance (i.e. Medicaid, Medicare, [place] Health Choice, Veterans' Benefits)  Household income should be no more than 200% of the poverty level The clinic cannot treat you if you are pregnant or think you are pregnant  Sexually transmitted diseases are not treated at the clinic.    Dental Care: Organization         Address  Phone  Notes  [facility] Department of [facility] [place] [phone] Accepts children up to age 59 who are enrolled in [place] or [place]; pregnant women with a Medicaid card; and children who have applied for Medicaid or [place] Health Choice, but were declined, whose parents can pay a reduced fee at time of service.  [facility] Department of [facility]  [address], [place] [phone] Accepts children up to age 4 who are enrolled in [place] or [place]; pregnant women with a Medicaid card; and children who have applied for Medicaid or [place] Health Choice, but were  declined, whose parents can pay a reduced fee at time of service.  [place] Adult [facility] Access PROGRAM  [place] [phone] Patients are seen by appointment only. Walk-ins are not accepted. [place] will see patients 62 years of age and older. Monday - Tuesday (8am-5pm) Most Wednesdays (8:30-5pm) $30 per visit, cash only  [facility] Adult [facility] Access [facility]  [address], [facility] [phone] Patients are seen by appointment only. Walk-ins are not accepted. [place] will see patients 79 years of age and older.  One Wednesday Evening (Monthly: Volunteer Based).  $30 per visit, cash only  [place]  [phone] for adults; Children under age 57, call Graduate Pediatric Dentistry at [phone]. Children aged 30-14, please call [phone] to request a pediatric application.  Dental services are provided in all areas of dental care including fillings, crowns and bridges, complete and partial dentures, implants, gum treatment, root canals, and extractions. Preventive care is also provided. Treatment is provided to both adults and children. Patients are selected via a lottery and there is often a waiting list.   [facility] [address], [place]  [phone] [email or web address]   [facility] [address] [place], [place] [phone], Ext. [phone] Second and Fourth Thursday of each month, opens at 6:30 AM; Clinic ends at 9 AM.  Patients are seen on a first-come first-served basis, and a limited number are seen during each clinic.   [facility]  [address] Danker [place], [place] [phone]   Eligibility Requirements You must have lived in [place], [place], or [place] counties for at least the last three months.   You cannot be eligible for state or federal sponsored [employer], including [employer], [place], or [employer].   You generally cannot be  eligible for healthcare insurance through your employer.    How to apply: Eligibility screenings are held every Tuesday and Wednesday afternoon from 1:00 pm until 4:00 pm. You do not need an appointment for the interview!  [facility] [address] [place], [place]   [place]  [place] Department  [place]  [phone]    Behavioral Health Resources in the Community: Intensive Outpatient Programs Organization         Address  Phone  Notes  [place]. [address], [place], [place] [phone]   [facility] Outpatient [address], [place], [place]   ADS: Alcohol & Drug Svcs [address], [place], [place]   [place] [address],  [place], [place] or [phone]   Substance Abuse Resources Organization         Address  Phone  Notes  Alcohol and Drug Services  [phone]   [place]  [phone]   [facility] [place]   Chinita Pester  [phone]   Residential & Outpatient Substance Abuse Program  [phone]   Psychological Services Organization         Address  Phone  Notes  [facility] [place]  [place]  [phone]   [place] [address] [place] or [phone]    Mobile Crisis Teams Organization         Address  Phone  Notes  [facility], Mobile Crisis Care Unit  [phone]   [facility]  [address] [place], [place]   [place] Levels [address] [place] [phone]    Self-Help/Support Groups Organization         Address  Phone             Notes  [place]. of [place] - variety of support groups  [place] Call for more information    Narcotics Anonymous (NA), [facility] [address], [employer] [place]  2 meetings at  this location   Residential Treatment Programs Organization         Address  Phone  Notes  [facility] [address]    [place]  [phone]   [facility]  [address], [place] [address], [place], [place]   [place], [place] [phone] Admissions: 8am-3pm M-F  Incentives Substance [place] [address]    [place], [place] [phone]   [facility] [address] [place], [place], [place]   [facility] [address]  [place], [place]   [facility]ight Programs - Intensive Outpatient [address], [place], [place]   [facility] ([place] [place].,  [place], [place] [phone] or [phone]   Residential Treatment Services (RTS) [address], [place], [place] Accepts Medicaid  [facility] [place] [address],  [place] [phone] Substance Abuse/Addiction Treatment   [facility] Organization         Address  Phone  Notes  [facility]  [phone]   Domenic Schwab, PhD [address] Arlis [place], [place]   [phone] or [phone]   [facility] [place], [place] [phone]   [facility] [address], [place], [place] [phone] Insurance/Medicaid/sponsorship through [facility] and Families [address], [address]                                    [place], [place] [phone] [place] [address], [place] [phone]    Dr. Adele Schilder  [phone]   [facility] Dept. 1) [address], [place] 2) [place] 3)  [place] [address], [place] [phone]  [phone]   [place] [phone] or [phone] (After Hours)

## 2013-03-10 NOTE — ED Provider Notes (Signed)
Medical screening examination/treatment/procedure(s) were conducted as a shared visit with non-physician practitioner(s) and myself.  I personally evaluated the patient during the encounter.  EKG Interpretation    Date/Time:  Tuesday March 09 2013 23:30:30 EST Ventricular Rate:  71 PR Interval:  198 QRS Duration: 94 QT Interval:  452 QTC Calculation: 491 R Axis:   61 Text Interpretation:  Sinus rhythm Multiple premature complexes, vent  Probable anteroseptal infarct, old No significant change since last tracing Confirmed by GOLDSTON  MD, SCOTT ([id number]) on 03/09/2013 11:34:54 PM           I evaluated Mr Dale Mcfarland for his multiple unrelated complaints - he is homeless, was just [facility], and declines going to a shelter because he doesn't like that he has to be there by 8pm. He apears well, huis lungs are clear, heart RRR, no neuro deficits and VS WNL.  He had labs, CXR and ECG performed and at this point has no indication for admit, or further ED work up.  He is stable for discharge with plan follow up as directed by his doctor.  He is agreeable to plan and instructions.   Teressa Lower, MD 03/10/13 [phone]

## 2013-03-10 NOTE — ED Notes (Addendum)
Pt ambulated in hallway on RA pt dropped to 89% but quickly returned to 96%, pulse rate 122. Marissa, PA made aware

## 2013-03-12 ENCOUNTER — Encounter (HOSPITAL_COMMUNITY): Payer: Self-pay | Admitting: Emergency Medicine

## 2013-03-12 DIAGNOSIS — Z8701 Personal history of pneumonia (recurrent): Secondary | ICD-10-CM | POA: Insufficient documentation

## 2013-03-12 DIAGNOSIS — Z7982 Long term (current) use of aspirin: Secondary | ICD-10-CM | POA: Insufficient documentation

## 2013-03-12 DIAGNOSIS — F17200 Nicotine dependence, unspecified, uncomplicated: Secondary | ICD-10-CM | POA: Insufficient documentation

## 2013-03-12 DIAGNOSIS — Z880 Allergy status to penicillin: Secondary | ICD-10-CM | POA: Insufficient documentation

## 2013-03-12 DIAGNOSIS — Z5900 Homelessness unspecified: Secondary | ICD-10-CM | POA: Insufficient documentation

## 2013-03-12 DIAGNOSIS — J441 Chronic obstructive pulmonary disease with (acute) exacerbation: Secondary | ICD-10-CM | POA: Insufficient documentation

## 2013-03-12 DIAGNOSIS — Z862 Personal history of diseases of the blood and blood-forming organs and certain disorders involving the immune mechanism: Secondary | ICD-10-CM | POA: Insufficient documentation

## 2013-03-12 DIAGNOSIS — Z8719 Personal history of other diseases of the digestive system: Secondary | ICD-10-CM | POA: Insufficient documentation

## 2013-03-12 DIAGNOSIS — E119 Type 2 diabetes mellitus without complications: Secondary | ICD-10-CM | POA: Insufficient documentation

## 2013-03-12 DIAGNOSIS — Z8781 Personal history of (healed) traumatic fracture: Secondary | ICD-10-CM | POA: Insufficient documentation

## 2013-03-12 DIAGNOSIS — G8929 Other chronic pain: Secondary | ICD-10-CM | POA: Insufficient documentation

## 2013-03-12 NOTE — ED Notes (Signed)
The pt  Reports that he has bronchitis and has had it for years.  He also reports that he just  Left the hospital for the same.  Coughing intermittently

## 2013-03-12 NOTE — ED Notes (Signed)
The pt is c/o leg and ear pain

## 2013-03-13 ENCOUNTER — Emergency Department (HOSPITAL_COMMUNITY): Payer: Medicare Other

## 2013-03-13 ENCOUNTER — Emergency Department (HOSPITAL_COMMUNITY)
Admission: EM | Admit: 2013-03-13 | Discharge: 2013-03-13 | Disposition: A | Discharge status: Home / Self Care | Payer: Medicare Other | Attending: Emergency Medicine | Admitting: Emergency Medicine

## 2013-03-13 DIAGNOSIS — R059 Cough, unspecified: Secondary | ICD-10-CM

## 2013-03-13 DIAGNOSIS — R05 Cough: Secondary | ICD-10-CM

## 2013-03-13 MED ORDER — LEVOFLOXACIN 750 MG PO TABS
750.0000 mg | ORAL_TABLET | Freq: Once | ORAL | Status: AC
  Administered 2013-03-13: 750 mg via ORAL
  Filled 2013-03-13: qty 1

## 2013-03-13 MED ORDER — ALBUTEROL SULFATE HFA 108 (90 BASE) MCG/ACT IN AERS
2.0000 | INHALATION_SPRAY | RESPIRATORY_TRACT | Status: DC | PRN
  Administered 2013-03-13: 2 via RESPIRATORY_TRACT
  Filled 2013-03-13: qty 6.7

## 2013-03-13 NOTE — ED Notes (Signed)
MD at bedside. 

## 2013-03-13 NOTE — ED Notes (Signed)
Nurse First Rounds : Unable to locate pt. at triage and waiting area.

## 2013-03-13 NOTE — Discharge Instructions (Signed)

## 2013-03-13 NOTE — ED Provider Notes (Signed)
CSN: [id number]     Arrival date & time 03/12/13  2309 History   First MD Initiated Contact with Patient 03/13/13 0239     Chief Complaint  Patient presents with  . Bronchitis   (Consider location/radiation/quality/duration/timing/severity/associated sxs/prior Treatment) HPI History provided by patient. Is homeless with recent multiple ED visits, presents requesting something to eat. He states that he has pneumonia and has not been able to fill his prescription for antibiotics. He denies any fevers. No chest pain. No leg pain or leg swelling. He has a history of COPD, diabetes, hypertension. He has some intermittent dry cough. No wheezing. He is an everyday tobacco smoker. Medicines he is currently taking are aspirin and Prilosec.  Past Medical History  Diagnosis Date  . COPD (chronic obstructive pulmonary disease)   . Chronic back pain   . Pneumonia     01/2011 - CAP vs aspiration pneuonmia  . Depression   . PONV (postoperative nausea and vomiting)   . Hyponatremia     Previously felt secondary to SIADH  . Hypertension   . Upper GI bleed     01/2011 with EGD showing severe candida esophagitis and duodenal bulb erosion  . Anemia     In the setting of UGI bleed 01/2011 requiring blood transfusion  . Homelessness   . Dyspnea     Chronic, thought due to COPD. Echo 02/2010 with EF 30-35%, nuclear study negative, then repeat echo 03/2011 did not show systolic dysfxn, so unclear if truly HF  . Bronchitis   . Diabetes mellitus without complication   . Collar bone fracture     left   Past Surgical History  Procedure Laterality Date  . Tonsillectomy    . Vasectomy    . Appendectomy    . Tonsillectomy    . Colonoscopy    . Esophagogastroduodenoscopy    . Esophagogastroduodenoscopy  02/03/2011    Procedure: ESOPHAGOGASTRODUODENOSCOPY (EGD);  Surgeon: Juanita Craver, MD;  Location: [facility];  Service: Endoscopy;  Laterality: N/A;  . Esophagogastroduodenoscopy  02/03/2011    Procedure:  ESOPHAGOGASTRODUODENOSCOPY (EGD);  Surgeon: Juanita Craver, MD;  Location: [facility];  Service: Endoscopy;  Laterality: N/A;  . Colonoscopy  01/30/2012    Procedure: COLONOSCOPY;  Surgeon: Ladene Artist, MD,FACG;  Location: [facility];  Service: Endoscopy;  Laterality: N/A;   Family History  Problem Relation Age of Onset  . Coronary artery disease Father     Starting in his 76's. Died of massive MI at age 48.  . Schizophrenia Brother   . Coronary artery disease Sister     MI at age 51  . Alzheimer's disease Mother    History  Substance Use Topics  . Smoking status: Current Some Day Smoker -- 1.00 packs/day for 60 years    Types: Cigarettes  . Smokeless tobacco: Never Used     Comment: Since age 41  . Alcohol Use: Yes     Comment: 174 ml of scotch weekly    Review of Systems  Constitutional: Negative for fever and chills.  Respiratory: Positive for cough and shortness of breath. Negative for wheezing.   Cardiovascular: Negative for chest pain.  Gastrointestinal: Negative for abdominal pain.  Genitourinary: Negative for dysuria.  Musculoskeletal: Negative for back pain, neck pain and neck stiffness.  Skin: Negative for rash.  Neurological: Negative for headaches.  All other systems reviewed and are negative.    Allergies  Benadryl and Penicillins  Home Medications   Current Outpatient Rx  Name  Route  Sig  Dispense  Refill  . aspirin 81 MG chewable tablet   Oral   Chew 1 tablet (81 mg total) by mouth daily.   30 tablet   11   . omeprazole (PRILOSEC) 40 MG capsule   Oral   Take 1 capsule (40 mg total) by mouth daily.   60 capsule   11    BP 147/80  Pulse 71  Temp(Src) 98.4 F (36.9 C) (Oral)  Resp 17  SpO2 98% Physical Exam  Constitutional: He is oriented to person, place, and time.  Disheveled appearing in no acute distress  HENT:  Head: Normocephalic and atraumatic.  Eyes: EOM are normal. Pupils are equal, round, and reactive to light.  Neck:  Neck supple.  Cardiovascular: Regular rhythm and intact distal pulses.   Pulmonary/Chest: Effort normal and breath sounds normal. No respiratory distress. He has no wheezes. He has no rales. He exhibits no tenderness.  Abdominal: Soft. Bowel sounds are normal. He exhibits no distension. There is no tenderness.  Musculoskeletal: Normal range of motion. He exhibits no edema.  Neurological: He is alert and oriented to person, place, and time.  Skin: Skin is warm and dry.    ED Course  Procedures (including critical care time) Labs Review Labs Reviewed - No data to display Imaging Review Dg Chest 2 View  03/13/2013   CLINICAL DATA:  Bronchitis  EXAM: CHEST  2 VIEW  COMPARISON:  03/09/2013  FINDINGS: Chronic interstitial coarsening and architectural distortion. No change from prior to suggest superimposed pneumonia or consolidation. No effusion or pneumothorax. Stable heart size, with mediastinal contours distorted by rightward rotation.  IMPRESSION: Chronic lung disease. No change from prior to suggest pneumonia or edema.   Electronically Signed   By: Jorje Guild M.D.   On: 03/13/2013 03:38   Chest x-ray reviewed as above.  Levaquin and albuterol inhaler provided. Patient encouraged to followup in the clinic. He was also encouraged to stay at one a local shelters - he again tells me that they close to early (8pm) and therefore he does not like them.   MDM  Diagnosis: Cough, history of COPD, homeless  Previous records reviewed including recent hospital stay, and recent ED visit for the same complaints Chest x-ray obtained, no acute abnormality Medications provided Stable and appropriate for discharge home    Teressa Lower, MD 03/13/13 [phone]

## 2013-03-14 ENCOUNTER — Emergency Department (HOSPITAL_COMMUNITY)
Admission: EM | Admit: 2013-03-14 | Discharge: 2013-03-14 | Disposition: A | Discharge status: Home / Self Care | Payer: Medicare Other | Attending: Emergency Medicine | Admitting: Emergency Medicine

## 2013-03-14 ENCOUNTER — Encounter (HOSPITAL_COMMUNITY): Payer: Self-pay | Admitting: Emergency Medicine

## 2013-03-14 DIAGNOSIS — Z7982 Long term (current) use of aspirin: Secondary | ICD-10-CM | POA: Insufficient documentation

## 2013-03-14 DIAGNOSIS — R059 Cough, unspecified: Secondary | ICD-10-CM

## 2013-03-14 DIAGNOSIS — J449 Chronic obstructive pulmonary disease, unspecified: Secondary | ICD-10-CM

## 2013-03-14 DIAGNOSIS — J4489 Other specified chronic obstructive pulmonary disease: Secondary | ICD-10-CM | POA: Insufficient documentation

## 2013-03-14 DIAGNOSIS — E119 Type 2 diabetes mellitus without complications: Secondary | ICD-10-CM | POA: Insufficient documentation

## 2013-03-14 DIAGNOSIS — Z79899 Other long term (current) drug therapy: Secondary | ICD-10-CM | POA: Insufficient documentation

## 2013-03-14 DIAGNOSIS — Z8659 Personal history of other mental and behavioral disorders: Secondary | ICD-10-CM | POA: Insufficient documentation

## 2013-03-14 DIAGNOSIS — Z8781 Personal history of (healed) traumatic fracture: Secondary | ICD-10-CM | POA: Insufficient documentation

## 2013-03-14 DIAGNOSIS — G8929 Other chronic pain: Secondary | ICD-10-CM | POA: Insufficient documentation

## 2013-03-14 DIAGNOSIS — I10 Essential (primary) hypertension: Secondary | ICD-10-CM | POA: Insufficient documentation

## 2013-03-14 DIAGNOSIS — Z5900 Homelessness unspecified: Secondary | ICD-10-CM | POA: Insufficient documentation

## 2013-03-14 DIAGNOSIS — R05 Cough: Secondary | ICD-10-CM

## 2013-03-14 DIAGNOSIS — Z880 Allergy status to penicillin: Secondary | ICD-10-CM | POA: Insufficient documentation

## 2013-03-14 DIAGNOSIS — Z8701 Personal history of pneumonia (recurrent): Secondary | ICD-10-CM | POA: Insufficient documentation

## 2013-03-14 DIAGNOSIS — F17200 Nicotine dependence, unspecified, uncomplicated: Secondary | ICD-10-CM | POA: Insufficient documentation

## 2013-03-14 DIAGNOSIS — Z862 Personal history of diseases of the blood and blood-forming organs and certain disorders involving the immune mechanism: Secondary | ICD-10-CM | POA: Insufficient documentation

## 2013-03-14 NOTE — ED Notes (Addendum)
Pt reports having bronchitis, which was diagnosed yesterday at [facility] ED. Pt reports that his symptoms has not improved since yesterday. Pt was administered Levaquin (PO) and albuterol at [facility] ED. Pt also reports weakness and difficulty ambulating since he has been sick.

## 2013-03-14 NOTE — Discharge Instructions (Signed)
1. Medications: usual home medications °2. Treatment: rest, drink plenty of fluids,  °3. Follow Up: Please followup with your primary doctor for discussion of your diagnoses and further evaluation after today's visit; if you do not have a primary care doctor use the resource guide provided to find one;  ° ° °Emergency Department Resource Guide °1) Find a Doctor and Pay Out of Pocket °Although you won't have to find out who is covered by your insurance plan, it is a good idea to ask around and get recommendations. You will then need to call the office and see if the doctor you have chosen will accept you as a new patient and what types of options they offer for patients who are self-pay. Some doctors offer discounts or will set up payment plans for their patients who do not have insurance, but you will need to ask so you aren't surprised when you get to your appointment. ° °2) Contact Your Local Health Department °Not all health departments have doctors that can see patients for sick visits, but many do, so it is worth a call to see if yours does. If you don't know where your local health department is, you can check in your phone book. The CDC also has a tool to help you locate your state's health department, and many state websites also have listings of all of their local health departments. ° °3) Find a Walk-in Clinic °If your illness is not likely to be very severe or complicated, you may want to try a walk in clinic. These are popping up all over the country in pharmacies, drugstores, and shopping centers. They're usually staffed by nurse practitioners or physician assistants that have been trained to treat common illnesses and complaints. They're usually fairly quick and inexpensive. However, if you have serious medical issues or chronic medical problems, these are probably not your best option. ° °No Primary Care Doctor: °- Call [facility] at  832-8000 - they can help you locate a primary care doctor that   accepts your insurance, provides certain services, etc. °- Physician Referral Service- 1-800-533-3463 ° °Chronic Pain Problems: °Organization         Address  Phone   Notes  °[facility]  (336) 297-2271 Patients need to be referred by their primary care doctor.  ° °Medication Assistance: °Organization         Address  Phone   Notes  °[place] [facility] [address] °[place], [place] [address] (336) 641-8030 --Must be a resident of [place] °-- Must have NO insurance coverage whatsoever (no Medicaid/ Medicare, etc.) °-- The pt. MUST have a primary care doctor that directs their care regularly and follows them in the community °  °[facility]  (866) 331-1348   °[employer]  (888) 892-1162   ° °Agencies that provide inexpensive medical care: °Organization         Address  Phone   Notes  °[facility]  (336) 832-8035   °[facility]    (336) 832-7272   °[facility] [address] °[place], [place] [address] (336) 832-4777   °[facility] [address], °[place] (336) 271-4999   °[facility]    (336) 373-0678   °[facility]    (336) 272-1050   °[facility] ° [address], [place] Phone:  (336) 832-4444, Fax:  (336) 832-4440 Hours of Operation:    9 am - 6 pm, M-F.  Also accepts Medicaid/Medicare and self-pay.  °[facility] ° [address], [place] Phone: (336) 832-3150, Fax: (336) 832-3151. Hours of Operation:  8:30 am - 5:30 pm, M-F.  Also accepts Medicaid and self-pay.  °[facility] [place] [address], [place] Phone: (336) 878-6027   °[facility] [address], [place], [place] (336)723-1848, Ext. 123 Mondays & Thursdays: 7-9 AM.  First 15 patients are seen on a first come, first serve basis. °  ° °Medicaid-accepting [place] Providers: ° °Organization          Address  Phone   Notes  °[facility] [address], [place] (336) 641-2100 Also accepts self-pay patients.  °[facility] [address], [place] ° (336) 856-9996   °[facility] [address], [place] (336) 288-8857   °[facility] [address] [place] [address], [place] (336) 299-7000   °Veita Bland [address], [place]  ° (336) 373-1557 Only accepts [place] Access Medicaid patients after they have their name applied to their card.  ° °Self-Pay (no insurance) in [place]: ° °Organization         Address  Phone   Notes  °Sickle Cell Patients, [facility] [address], [place] (336) 832-1970   °[facility] [address], [place] (336) 832-4400   °[facility] ° [address], [place] (336) 992-4800   °[facility]/Dr. Osei-Bonsu ° [address] [place] [address], [place] or [address], [place] (336) 841-8500 Phone number for both [place] and [place] locations is the same.  °[facility] [address], [place] (336) 299-0000   °[facility] [address] [place] [address], [place] or [address] (336) 852-7530 °(336) 878-2260   °[facility] [address], [place] (336) 350-1642, phone; (336) 294-5005, fax Sees patients 1st and 3rd Saturday of every month.  Must not qualify for public or private insurance (i.e. Medicaid, Medicare, [place] Health Choice, Veterans' Benefits) • Household income should be no more than 200% of the poverty level •The clinic cannot treat you if you are pregnant or think you are pregnant • Sexually transmitted diseases are not treated at the clinic.  ° ° °Dental Care: °Organization         Address  Phone  Notes  °[facility] [address],  [place] (336) 641-6152 Accepts children up to age 21 who are enrolled in Medicaid or [place] Health Choice; pregnant women with a Medicaid card; and children who have applied for Medicaid or [place] Health Choice, but were declined, whose parents can pay a reduced fee at time of service.  °[facility] [place]  [address], [place] (336) 641-7733 Accepts children up to age 21 who are enrolled in Medicaid or [place] Health Choice; pregnant women with a Medicaid card; and children who have applied for Medicaid or [place] Health Choice, but were declined, whose parents can pay a reduced fee at time of service.  °[facility] ° [address], [place] (336) 641-4533 Patients are seen by appointment only. Walk-ins are not accepted. [facility] will see patients 18 years   of age and older. °Monday - Tuesday (8am-5pm) °Most Wednesdays (8:30-5pm) °$30 per visit, cash only  °[facility] ° [address], [place] (336) 641-4533 Patients are seen by appointment only. Walk-ins are not accepted. [facility] will see patients 18 years of age and older. °One Wednesday Evening (Monthly: Volunteer Based).  $30 per visit, cash only  °[facility]  (919) 537-3737 for adults; Children under age 4, call Graduate Pediatric Dentistry at (919) [phone]. Children aged 4-14, please call (919) [phone] to request a pediatric application. ° Dental services are provided in all areas of dental care including fillings, crowns and bridges, complete and partial dentures, implants, gum treatment, root canals, and extractions. Preventive care is also provided. Treatment is provided to both adults and children. °Patients are selected via a lottery and there is often a waiting list. °  °[facility] [address], °[place] ° (336) 763-8833 [email or web address] °  °[facility] [address], [place], [place]  (336)723-1848, Ext. 123 Second and Fourth Thursday of each month, opens at 6:30 AM; Clinic ends at 9 AM.  Patients are seen on a first-come first-served basis, and a limited number are seen during each clinic.  ° °[facility] ° [address], [place], [place] (336) 723-7904   Eligibility Requirements °You must have lived in [place], [place], or [place] counties for at least the last three months. °  You cannot be eligible for state or federal sponsored healthcare insurance, including Veterans Administration, Medicaid, or Medicare. °  You generally cannot be eligible for healthcare insurance through your employer.  °  How to apply: °Eligibility screenings are held every Tuesday and Wednesday afternoon from 1:00 pm until 4:00 pm. You do not need an appointment for the interview!  °[facility] [address], [place], [place] 336-631-2330   °[facility]  336-342-8273   °[facility]  336-703-3100   °[place] [facility]  336-570-6415   ° °Behavioral Health Resources in the Community: °Intensive Outpatient Programs °Organization         Address  Phone  Notes  °[facility] [address], [place], [place] 336-878-6098   °[facility] [address], [place], [place] 336-832-9800   °[facility] [address], [place], [place] ° 336-882-2125   °[facility] [address],  °[place], [place] 1-800-853-5163 or 336-641-4981   °Substance Abuse Resources °Organization         Address  Phone  Notes  °[facility]  336-882-2125   °[facility]  336-784-9470   °[facility]  336-285-9073   °[facility]  336-845-3988   °Residential & Outpatient Substance Abuse Program  1-800-659-3381   °Psychological Services °Organization         Address  Phone  Notes  °[facility]  336- 832-9600   °[facility]  336- 378-7881    °[facility] [address], [place] 1-800-853-5163 or 336-641-4981   ° °Mobile Crisis Teams °Organization         Address  Phone  Notes  °[facility], Mobile Crisis Care Unit  1-877-626-1772   °Assertive °[facility] ° [address] [place], [place] 336-834-9664   °Sharon DeEsch [address] °[place] 336-554-5454   ° °Self-Help/Support Groups °Organization           Address  Phone             Notes  °[facility] - variety of support groups  336- 373-1402 Call for more information  °Narcotics Anonymous (NA), [facility] [address], °[place]  2 meetings at this location  ° °Residential Treatment Programs °Organization         Address  Phone  Notes  °[facility] [address],    °[place]  1-866-801-8205   °[facility] ° [address], [place], [place] 704-293-8524   °[facility] [address], [place] 336-845-3988 Admissions: 8am-3pm M-F  °[facility] [address]    °[place], [place] 336-841-1104   °[facility] [address], [place], [place] 336-379-7146   °[facility] [address]  °[place], [place] 336-285-9073   °[facility] - Intensive Outpatient [address], [place], [place] 336-852-3033   °[facility] [address]  °[place], [place] 1-877-615-2722 or 336-784-9470   °[facility] [address] [place], [place] 336-227-7417 Accepts Medicaid  °[facility] [address]  °[place] 1-800-659-3381 Substance Abuse/Addiction Treatment  ° °[place] Behavioral Health Resources °Organization         Address  Phone  Notes  °[facility]  (888) 581-9988   °Julie Brannon, PhD [address] [place], [place]   (336) 349-5553 or (336) 951-0000   °[facility]   [address] °[place], [place] (336) 349-4454   °[facility] [address], [place], [place] (336) 342-8316 Insurance/Medicaid/sponsorship through [facility]  °[facility] [address]                                    [place], [place] (336) 342-8316 Therapy/tele-psych/case  °[facility] [address]  ° [place], [place] (336) 349-2233    °Dr. Arfeen  (336) 349-4544   °[facility] [place]  [employer] [place] [facility] 1) [address], [place] °2) [address], [place] °3)  [address], [place] (336) 349-3220 °(336) 342-7768 ° °(336) 342-8140   °[place] [facility] (336) 342-1394 or (336) 342-3537 (After Hours)    ° ° ° ° °

## 2013-03-14 NOTE — ED Provider Notes (Signed)
CSN: [id number]     Arrival date & time 03/14/13  1813 History   First MD Initiated Contact with Patient 03/14/13 1827     Chief Complaint  Patient presents with  . Bronchitis   (Consider location/radiation/quality/duration/timing/severity/associated sxs/prior Treatment) The history is provided by the patient and medical records. No language interpreter was used.    Dale Mcfarland is a 80 y.o. male  with a hx of COPD, chronic back pain, HTN, bronchitis presents to the Emergency Department 10 days after being discharged from an inpatient stay at [facility] for COPD exacerbation.  Tonight he complains of being cold while outside.  He reports he was recently diagnosed with bronchitis and given medication for this.  He reports a history of homelessness and states that the shoulder with full tonight.  He denies fever, chills, headache, neck pain, , chest pain, shortness of breath, abdominal pain, nausea, vomiting, diarrhea, weakness, dizziness, syncope, dysuria, rash.  He endorses chronic cough, dry and unchanged. He also reports he continues to smoke.  He reports when he was discharged he was given a prescription and she does not have with him and states he has been unable to fill it because the drugstore was closed.  Past Medical History  Diagnosis Date  . COPD (chronic obstructive pulmonary disease)   . Chronic back pain   . Pneumonia     01/2011 - CAP vs aspiration pneuonmia  . Depression   . PONV (postoperative nausea and vomiting)   . Hyponatremia     Previously felt secondary to SIADH  . Hypertension   . Upper GI bleed     01/2011 with EGD showing severe candida esophagitis and duodenal bulb erosion  . Anemia     In the setting of UGI bleed 01/2011 requiring blood transfusion  . Homelessness   . Dyspnea     Chronic, thought due to COPD. Echo 02/2010 with EF 30-35%, nuclear study negative, then repeat echo 03/2011 did not show systolic dysfxn, so unclear if truly HF  . Bronchitis    . Diabetes mellitus without complication   . Collar bone fracture     left   Past Surgical History  Procedure Laterality Date  . Tonsillectomy    . Vasectomy    . Appendectomy    . Tonsillectomy    . Colonoscopy    . Esophagogastroduodenoscopy    . Esophagogastroduodenoscopy  02/03/2011    Procedure: ESOPHAGOGASTRODUODENOSCOPY (EGD);  Surgeon: Juanita Craver, MD;  Location: [facility];  Service: Endoscopy;  Laterality: N/A;  . Esophagogastroduodenoscopy  02/03/2011    Procedure: ESOPHAGOGASTRODUODENOSCOPY (EGD);  Surgeon: Juanita Craver, MD;  Location: [facility];  Service: Endoscopy;  Laterality: N/A;  . Colonoscopy  01/30/2012    Procedure: COLONOSCOPY;  Surgeon: Ladene Artist, MD,FACG;  Location: [facility];  Service: Endoscopy;  Laterality: N/A;   Family History  Problem Relation Age of Onset  . Coronary artery disease Father     Starting in his 29's. Died of massive MI at age 43.  . Schizophrenia Brother   . Coronary artery disease Sister     MI at age 70  . Alzheimer's disease Mother    History  Substance Use Topics  . Smoking status: Current Some Day Smoker -- 1.00 packs/day for 60 years    Types: Cigarettes  . Smokeless tobacco: Never Used     Comment: Since age 17  . Alcohol Use: Yes     Comment: 174 ml of scotch weekly  Review of Systems  Constitutional: Negative for fever, diaphoresis, appetite change, fatigue and unexpected weight change.  HENT: Negative for mouth sores.   Eyes: Negative for visual disturbance.  Respiratory: Positive for cough (chronic). Negative for chest tightness, shortness of breath and wheezing.   Cardiovascular: Negative for chest pain.  Gastrointestinal: Negative for nausea, vomiting, abdominal pain, diarrhea and constipation.  Genitourinary: Negative for dysuria, urgency, frequency and hematuria.  Skin: Negative for rash.  Allergic/Immunologic: Negative for immunocompromised state.  Neurological: Negative for syncope,  light-headedness and headaches.  Hematological: Does not bruise/bleed easily.  Psychiatric/Behavioral: The patient is not nervous/anxious.     Allergies  Benadryl and Penicillins  Home Medications   Current Outpatient Rx  Name  Route  Sig  Dispense  Refill  . aspirin 81 MG chewable tablet   Oral   Chew 1 tablet (81 mg total) by mouth daily.   30 tablet   11   . omeprazole (PRILOSEC) 40 MG capsule   Oral   Take 1 capsule (40 mg total) by mouth daily.   60 capsule   11    BP 96/52  Pulse 97  Temp(Src) 98.6 F (37 C) (Oral)  Resp 20  SpO2 96% Physical Exam  Nursing note and vitals reviewed. Constitutional: He is oriented to person, place, and time. He appears well-developed and well-nourished. No distress.  Awake, alert, nontoxic appearance  HENT:  Head: Normocephalic and atraumatic.  Right Ear: Tympanic membrane, external ear and ear canal normal.  Left Ear: Tympanic membrane, external ear and ear canal normal.  Nose: Mucosal edema and rhinorrhea present. No epistaxis. Right sinus exhibits no maxillary sinus tenderness and no frontal sinus tenderness. Left sinus exhibits no maxillary sinus tenderness and no frontal sinus tenderness.  Mouth/Throat: Uvula is midline, oropharynx is clear and moist and mucous membranes are normal. Mucous membranes are not pale and not cyanotic. No oropharyngeal exudate, posterior oropharyngeal edema, posterior oropharyngeal erythema or tonsillar abscesses.  Eyes: Conjunctivae are normal. Pupils are equal, round, and reactive to light. No scleral icterus.  Neck: Normal range of motion and full passive range of motion without pain. Neck supple.  Cardiovascular: Normal rate, regular rhythm, normal heart sounds and intact distal pulses.   No murmur heard. Pulmonary/Chest: Effort normal and breath sounds normal. No stridor. No respiratory distress. He has no wheezes.  Clear and equal breath sounds with good tidal volume No rales, rhonchi or  wheezes  Abdominal: Soft. Bowel sounds are normal. He exhibits no mass. There is no tenderness. There is no rebound and no guarding.  Abdomen soft and nontender  Musculoskeletal: Normal range of motion. He exhibits no edema.  Lymphadenopathy:    He has no cervical adenopathy.  Neurological: He is alert and oriented to person, place, and time.  Speech is clear and goal oriented Moves extremities without ataxia  Skin: Skin is warm and dry. No rash noted. He is not diaphoretic.  Psychiatric: He has a normal mood and affect.    ED Course  Procedures (including critical care time) Labs Review Labs Reviewed - No data to display Imaging Review Dg Chest 2 View  03/13/2013   CLINICAL DATA:  Bronchitis  EXAM: CHEST  2 VIEW  COMPARISON:  03/09/2013  FINDINGS: Chronic interstitial coarsening and architectural distortion. No change from prior to suggest superimposed pneumonia or consolidation. No effusion or pneumothorax. Stable heart size, with mediastinal contours distorted by rightward rotation.  IMPRESSION: Chronic lung disease. No change from prior to suggest pneumonia or edema.  Electronically Signed   By: Jorje Guild M.D.   On: 03/13/2013 03:38    EKG Interpretation   None       MDM   1. Cough   2. COPD (chronic obstructive pulmonary disease)    Isaul Fulco presents emergency department today because he is cold and has no where to live.  He reports chronic dry cough but is unchanged and reports that he is still a one pack per day smoker.  Record review shows the patient has been seen in the emergency department several times in the last few days for similar complaints.  He was given a chest x-ray yesterday which showed chronic lung disease and no changes from prior to suggest pneumonia or edema.  He has no chest pain or shortness of breath today. I personally reviewed the imaging tests through PACS system.  I reviewed available ER/hospitalization records through the EMR.  Several  days ago he was given an inhaler here in the emergency department.  He denies shortness of breath. He is afebrile, nontoxic, nonseptic appearing, non-tachycardic and not hypoxic.  We'll discharge home in stable condition.  It has been determined that no acute conditions requiring further emergency intervention are present at this time. The patient/guardian have been advised of the diagnosis and plan. We have discussed signs and symptoms that warrant return to the ED, such as changes or worsening in symptoms.   Vital signs are stable at discharge.   BP 96/52  Pulse 97  Temp(Src) 98.6 F (37 C) (Oral)  Resp 20  SpO2 96%  Patient/guardian has voiced understanding and agreed to follow-up with the PCP or specialist.          Abigail Butts, PA-C 03/14/13 [id number]

## 2013-03-14 NOTE — ED Notes (Signed)
Per EMS pt comes from off the street c/o bronchitis times one year, possibly longer he isnt for sure.  Pt states that he isnt able to get any meds that he needs.

## 2013-03-14 NOTE — ED Provider Notes (Signed)
Medical screening examination/treatment/procedure(s) were conducted as a shared visit with non-physician practitioner(s) and myself.  I personally evaluated the patient during the encounter.  EKG Interpretation   None       Patient is well-appearing.  Breathing is good.  He came the emergency department because he said it was cold outside.  We told he was welcome to stay in the waiting room to stay warm to the night.  Medical screening examination completed.  No life-threatening emergency.    Hoy Morn, MD 03/14/13 1901

## 2013-03-19 ENCOUNTER — Inpatient Hospital Stay: Payer: Medicare Other | Admitting: Internal Medicine

## 2013-03-21 ENCOUNTER — Emergency Department (HOSPITAL_COMMUNITY): Payer: Medicare Other

## 2013-03-21 ENCOUNTER — Emergency Department (HOSPITAL_COMMUNITY)
Admission: EM | Admit: 2013-03-21 | Discharge: 2013-03-21 | Disposition: A | Discharge status: Home / Self Care | Payer: Medicare Other | Attending: Emergency Medicine | Admitting: Emergency Medicine

## 2013-03-21 ENCOUNTER — Encounter (HOSPITAL_COMMUNITY): Payer: Self-pay | Admitting: Emergency Medicine

## 2013-03-21 DIAGNOSIS — R52 Pain, unspecified: Secondary | ICD-10-CM

## 2013-03-21 DIAGNOSIS — R1110 Vomiting, unspecified: Secondary | ICD-10-CM | POA: Insufficient documentation

## 2013-03-21 DIAGNOSIS — Z5900 Homelessness unspecified: Secondary | ICD-10-CM | POA: Insufficient documentation

## 2013-03-21 DIAGNOSIS — G8929 Other chronic pain: Secondary | ICD-10-CM | POA: Insufficient documentation

## 2013-03-21 DIAGNOSIS — Z880 Allergy status to penicillin: Secondary | ICD-10-CM | POA: Insufficient documentation

## 2013-03-21 DIAGNOSIS — F17200 Nicotine dependence, unspecified, uncomplicated: Secondary | ICD-10-CM | POA: Insufficient documentation

## 2013-03-21 DIAGNOSIS — M549 Dorsalgia, unspecified: Secondary | ICD-10-CM | POA: Insufficient documentation

## 2013-03-21 DIAGNOSIS — R05 Cough: Secondary | ICD-10-CM

## 2013-03-21 DIAGNOSIS — Z862 Personal history of diseases of the blood and blood-forming organs and certain disorders involving the immune mechanism: Secondary | ICD-10-CM | POA: Insufficient documentation

## 2013-03-21 DIAGNOSIS — R059 Cough, unspecified: Secondary | ICD-10-CM | POA: Insufficient documentation

## 2013-03-21 DIAGNOSIS — Z9889 Other specified postprocedural states: Secondary | ICD-10-CM | POA: Insufficient documentation

## 2013-03-21 DIAGNOSIS — Z79899 Other long term (current) drug therapy: Secondary | ICD-10-CM | POA: Insufficient documentation

## 2013-03-21 DIAGNOSIS — IMO0001 Reserved for inherently not codable concepts without codable children: Secondary | ICD-10-CM | POA: Insufficient documentation

## 2013-03-21 DIAGNOSIS — Z7982 Long term (current) use of aspirin: Secondary | ICD-10-CM | POA: Insufficient documentation

## 2013-03-21 DIAGNOSIS — E119 Type 2 diabetes mellitus without complications: Secondary | ICD-10-CM | POA: Insufficient documentation

## 2013-03-21 DIAGNOSIS — Z9089 Acquired absence of other organs: Secondary | ICD-10-CM | POA: Insufficient documentation

## 2013-03-21 DIAGNOSIS — Z8701 Personal history of pneumonia (recurrent): Secondary | ICD-10-CM | POA: Insufficient documentation

## 2013-03-21 DIAGNOSIS — Z8659 Personal history of other mental and behavioral disorders: Secondary | ICD-10-CM | POA: Insufficient documentation

## 2013-03-21 DIAGNOSIS — Z8781 Personal history of (healed) traumatic fracture: Secondary | ICD-10-CM | POA: Insufficient documentation

## 2013-03-21 DIAGNOSIS — R058 Other specified cough: Secondary | ICD-10-CM

## 2013-03-21 DIAGNOSIS — M791 Myalgia, unspecified site: Secondary | ICD-10-CM

## 2013-03-21 DIAGNOSIS — Z8719 Personal history of other diseases of the digestive system: Secondary | ICD-10-CM | POA: Insufficient documentation

## 2013-03-21 DIAGNOSIS — I10 Essential (primary) hypertension: Secondary | ICD-10-CM | POA: Insufficient documentation

## 2013-03-21 LAB — COMPREHENSIVE METABOLIC PANEL
ALK PHOS: 130 U/L — AB (ref 39–117)
ALT: 12 U/L (ref 0–53)
AST: 15 U/L (ref 0–37)
Albumin: 3 g/dL — ABNORMAL LOW (ref 3.5–5.2)
BUN: 26 mg/dL — ABNORMAL HIGH (ref 6–23)
CHLORIDE: 104 meq/L (ref 96–112)
CO2: 26 meq/L (ref 19–32)
CREATININE: 1 mg/dL (ref 0.50–1.35)
Calcium: 8.6 mg/dL (ref 8.4–10.5)
GFR calc Af Amer: 79 mL/min — ABNORMAL LOW (ref >90)
GFR, EST NON AFRICAN AMERICAN: 68 mL/min — AB (ref >90)
GLUCOSE: 106 mg/dL — AB (ref 70–99)
POTASSIUM: 4.1 meq/L (ref 3.7–5.3)
Sodium: 142 mEq/L (ref 137–147)
Total Bilirubin: <0.2 mg/dL — ABNORMAL LOW (ref 0.3–1.2)
Total Protein: 7.2 g/dL (ref 6.0–8.3)

## 2013-03-21 LAB — TROPONIN I: Troponin I: <0.3 ng/mL (ref <0.30)

## 2013-03-21 LAB — CBC
HEMATOCRIT: 33.6 % — AB (ref 39.0–52.0)
HEMOGLOBIN: 10.2 g/dL — AB (ref 13.0–17.0)
MCH: 23.7 pg — ABNORMAL LOW (ref 26.0–34.0)
MCHC: 30.4 g/dL (ref 30.0–36.0)
MCV: 78.1 fL (ref 78.0–100.0)
Platelets: 395 10*3/uL (ref 150–400)
RBC: 4.3 MIL/uL (ref 4.22–5.81)
RDW: 16.9 % — ABNORMAL HIGH (ref 11.5–15.5)
WBC: 7.3 10*3/uL (ref 4.0–10.5)

## 2013-03-21 LAB — LIPASE, BLOOD: LIPASE: 44 U/L (ref 11–59)

## 2013-03-21 LAB — ETHANOL

## 2013-03-21 NOTE — ED Provider Notes (Signed)
CSN: [id number]     Arrival date & time 03/21/13  45 History   First MD Initiated Contact with Patient 03/21/13 1230     Chief Complaint  Patient presents with  . Abdominal Pain   (Consider location/radiation/quality/duration/timing/severity/associated sxs/prior Treatment) Patient is a 80 y.o. male presenting with abdominal pain. The history is provided by the patient and the EMS personnel.  Abdominal Pain Associated symptoms: cough   Associated symptoms: no chest pain, no chills, no diarrhea, no dysuria, no fever, no shortness of breath and no sore throat   per ems, a bystander/concerned citizen had called ems about pt.  Pt states homeless, and has been homeless for extended period, states he is having pain all over body, denies focal pain. Pt is very difficult/poor historian. Pt also w non productive cough. States had episode emesis earlier, not bloody or bilious. No fever or chills. Denies trauma/fall/injury.  Pt states his all over pain is constant, denies specific exacerbating or alleviating factors.       Past Medical History  Diagnosis Date  . COPD (chronic obstructive pulmonary disease)   . Chronic back pain   . Pneumonia     01/2011 - CAP vs aspiration pneuonmia  . Depression   . PONV (postoperative nausea and vomiting)   . Hyponatremia     Previously felt secondary to SIADH  . Hypertension   . Upper GI bleed     01/2011 with EGD showing severe candida esophagitis and duodenal bulb erosion  . Anemia     In the setting of UGI bleed 01/2011 requiring blood transfusion  . Homelessness   . Dyspnea     Chronic, thought due to COPD. Echo 02/2010 with EF 30-35%, nuclear study negative, then repeat echo 03/2011 did not show systolic dysfxn, so unclear if truly HF  . Bronchitis   . Diabetes mellitus without complication   . Collar bone fracture     left   Past Surgical History  Procedure Laterality Date  . Tonsillectomy    . Vasectomy    . Appendectomy    . Tonsillectomy     . Colonoscopy    . Esophagogastroduodenoscopy    . Esophagogastroduodenoscopy  02/03/2011    Procedure: ESOPHAGOGASTRODUODENOSCOPY (EGD);  Surgeon: Juanita Craver, MD;  Location: [facility];  Service: Endoscopy;  Laterality: N/A;  . Esophagogastroduodenoscopy  02/03/2011    Procedure: ESOPHAGOGASTRODUODENOSCOPY (EGD);  Surgeon: Juanita Craver, MD;  Location: [facility];  Service: Endoscopy;  Laterality: N/A;  . Colonoscopy  01/30/2012    Procedure: COLONOSCOPY;  Surgeon: Ladene Artist, MD,FACG;  Location: [facility];  Service: Endoscopy;  Laterality: N/A;   Family History  Problem Relation Age of Onset  . Coronary artery disease Father     Starting in his 73's. Died of massive MI at age 65.  . Schizophrenia Brother   . Coronary artery disease Sister     MI at age 72  . Alzheimer's disease Mother    History  Substance Use Topics  . Smoking status: Current Some Day Smoker -- 1.00 packs/day for 60 years    Types: Cigarettes  . Smokeless tobacco: Never Used     Comment: Since age 44  . Alcohol Use: Yes     Comment: 174 ml of scotch weekly    Review of Systems  Constitutional: Negative for fever and chills.  HENT: Negative for sore throat.   Eyes: Negative for redness.  Respiratory: Positive for cough. Negative for shortness of breath.   Cardiovascular: Negative  for chest pain and leg swelling.  Gastrointestinal: Positive for abdominal pain. Negative for diarrhea.  Genitourinary: Negative for dysuria and flank pain.  Musculoskeletal: Negative for back pain and neck pain.  Skin: Negative for rash.  Neurological: Negative for headaches.  Hematological: Does not bruise/bleed easily.  Psychiatric/Behavioral: Negative for confusion.    Allergies  Benadryl and Penicillins  Home Medications   Current Outpatient Rx  Name  Route  Sig  Dispense  Refill  . aspirin 81 MG chewable tablet   Oral   Chew 1 tablet (81 mg total) by mouth daily.   30 tablet   11   . omeprazole  (PRILOSEC) 40 MG capsule   Oral   Take 1 capsule (40 mg total) by mouth daily.   60 capsule   11    BP 136/55  Pulse 86  Temp(Src) 99.1 F (37.3 C)  Resp 20  SpO2 96% Physical Exam  Nursing note and vitals reviewed. Constitutional: He is oriented to person, place, and time. He appears well-developed and well-nourished. No distress.  HENT:  Head: Atraumatic.  Mouth/Throat: Oropharynx is clear and moist.  Eyes: Conjunctivae are normal. No scleral icterus.  Neck: Neck supple. No tracheal deviation present.  No stiffness or rigidity  Cardiovascular: Normal rate, regular rhythm, normal heart sounds and intact distal pulses.   Pulmonary/Chest: Effort normal and breath sounds normal. No accessory muscle usage. No respiratory distress.  Abdominal: Soft. Bowel sounds are normal. He exhibits no distension and no mass. There is no tenderness. There is no rebound and no guarding.  Genitourinary:  No cva tenderness  Musculoskeletal: Normal range of motion. He exhibits no edema and no tenderness.  Neurological: He is alert and oriented to person, place, and time.  Skin: Skin is warm and dry.  Psychiatric: He has a normal mood and affect.    ED Course  Procedures (including critical care time)  Results for orders placed during the hospital encounter of 03/21/13  CBC      Result Value Range   WBC 7.3  4.0 - 10.5 K/uL   RBC 4.30  4.22 - 5.81 MIL/uL   Hemoglobin 10.2 (*) 13.0 - 17.0 g/dL   HCT 33.6 (*) 39.0 - 52.0 %   MCV 78.1  78.0 - 100.0 fL   MCH 23.7 (*) 26.0 - 34.0 pg   MCHC 30.4  30.0 - 36.0 g/dL   RDW 16.9 (*) 11.5 - 15.5 %   Platelets 395  150 - 400 K/uL  COMPREHENSIVE METABOLIC PANEL      Result Value Range   Sodium 142  137 - 147 mEq/L   Potassium 4.1  3.7 - 5.3 mEq/L   Chloride 104  96 - 112 mEq/L   CO2 26  19 - 32 mEq/L   Glucose, Bld 106 (*) 70 - 99 mg/dL   BUN 26 (*) 6 - 23 mg/dL   Creatinine, Ser 1.00  0.50 - 1.35 mg/dL   Calcium 8.6  8.4 - 10.5 mg/dL   Total  Protein 7.2  6.0 - 8.3 g/dL   Albumin 3.0 (*) 3.5 - 5.2 g/dL   AST 15  0 - 37 U/L   ALT 12  0 - 53 U/L   Alkaline Phosphatase 130 (*) 39 - 117 U/L   Total Bilirubin <0.2 (*) 0.3 - 1.2 mg/dL   GFR calc non Af Amer 68 (*) >90 mL/min   GFR calc Af Amer 79 (*) >90 mL/min  LIPASE, BLOOD  Result Value Range   Lipase 44  11 - 59 U/L  ETHANOL      Result Value Range   Alcohol, Ethyl (B) <11  0 - 11 mg/dL  TROPONIN I      Result Value Range   Troponin I <0.30  <0.30 ng/mL   Dg Chest 2 View  03/21/2013   CLINICAL DATA:  Previous injury 4 months ago  EXAM: CHEST  2 VIEW  COMPARISON:  03/13/2013  FINDINGS: Cardiac shadow is stable. Diffuse interstitial changes are again identified. No focal infiltrate or sizable effusion is seen. No acute bony abnormality is noted.  IMPRESSION: Chronic changes without acute abnormality.   Electronically Signed   By: Inez Catalina M.D.   On: 03/21/2013 13:47          . EKG Interpretation    Date/Time:  Sunday March 21 2013 13:16:55 EST Ventricular Rate:  85 PR Interval:  170 QRS Duration: 104 QT Interval:  386 QTC Calculation: 459 R Axis:   41 Text Interpretation:  Sinus rhythm Premature ventricular complexes No significant change since last tracing Confirmed by Ashok Cordia  MD, Keonta Monceaux ([id number]) on 03/21/2013 1:58:14 PM            MDM  Labs. Cxr.  Reviewed nursing notes and prior charts for additional history.   Food/fluids. Pt tolerating po.  abd soft nt.   No cp or increased wob.  Pt appears stable for d/c.      Mirna Mires, MD 03/21/13 1359

## 2013-03-21 NOTE — Discharge Instructions (Signed)
Rest. Drink plenty of fluids.  No smoking.  Follow up with primary care doctor in coming week. Return to ER if worse, new symptoms, fevers, trouble breathing, other concern.    Myalgia, Adult Myalgia is the medical term for muscle pain. It is a symptom of many things. Nearly everyone at some time in their life has this. The most common cause for muscle pain is overuse or straining and more so when you are not in shape. Injuries and muscle bruises cause myalgias. Muscle pain without a history of injury can also be caused by a virus. It frequently comes along with the flu. Myalgia not caused by muscle strain can be present in a large number of infectious diseases. Some autoimmune diseases like lupus and fibromyalgia can cause muscle pain. Myalgia may be mild, or severe. SYMPTOMS  The symptoms of myalgia are simply muscle pain. Most of the time this is short lived and the pain goes away without treatment. DIAGNOSIS  Myalgia is diagnosed by your caregiver by taking your history. This means you tell him when the problems began, what they are, and what has been happening. If this has not been a long term problem, your caregiver may want to watch for a while to see what will happen. If it has been long term, they may want to do additional testing. TREATMENT  The treatment depends on what the underlying cause of the muscle pain is. Often anti-inflammatory medications will help. HOME CARE INSTRUCTIONS  If the pain in your muscles came from overuse, slow down your activities until the problems go away.  Myalgia from overuse of a muscle can be treated with alternating hot and cold packs on the muscle affected or with cold for the first couple days. If either heat or cold seems to make things worse, stop their use.  Apply ice to the sore area for 15-20 minutes, 03-04 times per day, while awake for the first 2 days of muscle soreness, or as directed. Put the ice in a plastic bag and place a towel between the  bag of ice and your skin.  Only take over-the-counter or prescription medicines for pain, discomfort, or fever as directed by your caregiver.  Regular gentle exercise may help if you are not active.  Stretching before strenuous exercise can help lower the risk of myalgia. It is normal when beginning an exercise regimen to feel some muscle pain after exercising. Muscles that have not been used frequently will be sore at first. If the pain is extreme, this may mean injury to a muscle. SEEK MEDICAL CARE IF:  You have an increase in muscle pain that is not relieved with medication.  You begin to run a temperature.  You develop nausea and vomiting.  You develop a stiff and painful neck.  You develop a rash.  You develop muscle pain after a tick bite.  You have continued muscle pain while working out even after you are in good condition. SEEK IMMEDIATE MEDICAL CARE IF: Any of your problems are getting worse and medications are not helping. MAKE SURE YOU:   Understand these instructions.  Will watch your condition.  Will get help right away if you are not doing well or get worse. Document Released: 12/20/2005 Document Revised: 04/22/2011 Document Reviewed: 03/11/2006 [facility] Patient Information 2014 [place], [place].        Cough, Adult  A cough is a reflex that helps clear your throat and airways. It can help heal the body or may be a  reaction to an irritated airway. A cough may only last 2 or 3 weeks (acute) or may last more than 8 weeks (chronic).  CAUSES Acute cough:  Viral or bacterial infections. Chronic cough:  Infections.  Allergies.  Asthma.  Post-nasal drip.  Smoking.  Heartburn or acid reflux.  Some medicines.  Chronic lung problems (COPD).  Cancer. SYMPTOMS   Cough.  Fever.  Chest pain.  Increased breathing rate.  High-pitched whistling sound when breathing (wheezing).  Colored mucus that you cough up (sputum). TREATMENT   A  bacterial cough may be treated with antibiotic medicine.  A viral cough must run its course and will not respond to antibiotics.  Your caregiver may recommend other treatments if you have a chronic cough. HOME CARE INSTRUCTIONS   Only take over-the-counter or prescription medicines for pain, discomfort, or fever as directed by your caregiver. Use cough suppressants only as directed by your caregiver.  Use a cold steam vaporizer or humidifier in your bedroom or home to help loosen secretions.  Sleep in a semi-upright position if your cough is worse at night.  Rest as needed.  Stop smoking if you smoke. SEEK IMMEDIATE MEDICAL CARE IF:   You have pus in your sputum.  Your cough starts to worsen.  You cannot control your cough with suppressants and are losing sleep.  You begin coughing up blood.  You have difficulty breathing.  You develop pain which is getting worse or is uncontrolled with medicine.  You have a fever. MAKE SURE YOU:   Understand these instructions.  Will watch your condition.  Will get help right away if you are not doing well or get worse. Document Released: 07/27/2010 Document Revised: 04/22/2011 Document Reviewed: 07/27/2010 [facility] Patient Information 2014 [place].     Smoking Hazards Smoking cigarettes is extremely bad for your health. Tobacco smoke has over 200 known poisons in it. It contains the poisonous gases nitrogen oxide and carbon monoxide. There are over 60 chemicals in tobacco smoke that cause cancer. Some of the chemicals found in cigarette smoke include:   Cyanide.   Benzene.   Formaldehyde.   Methanol (wood alcohol).   Acetylene (fuel used in welding torches).   Ammonia.  Even smoking lightly shortens your life expectancy by several years. You can greatly reduce the risk of medical problems for you and your family by stopping now. Smoking is the most preventable cause of death and disease in our society. Within  days of quitting smoking, your circulation improves, you decrease the risk of having a heart attack, and your lung capacity improves. There may be some increased phlegm in the first few days after quitting, and it may take months for your lungs to clear up completely. Quitting for 10 years reduces your risk of developing lung cancer to almost that of a nonsmoker.  WHAT ARE THE RISKS OF SMOKING? Cigarette smokers have an increased risk of many serious medical problems, including:  Lung cancer.   Lung disease (such as pneumonia, bronchitis, and emphysema).   Heart attack and chest pain due to the heart not getting enough oxygen (angina).   Heart disease and peripheral blood vessel disease.   Hypertension.   Stroke.   Oral cancer (cancer of the lip, mouth, or voice box).   Bladder cancer.   Pancreatic cancer.   Cervical cancer.   Pregnancy complications, including premature birth.   Stillbirths and smaller newborn babies, birth defects, and genetic damage to sperm.   Early menopause.   Lower  estrogen level for women.   Infertility.   Facial wrinkles.   Blindness.   Increased risk of broken bones (fractures).   Senile dementia.   Stomach ulcers and internal bleeding.   Delayed wound healing and increased risk of complications during surgery. Because of secondhand smoke exposure, children of smokers have an increased risk of the following:   Sudden infant death syndrome (SIDS).   Respiratory infections.   Lung cancer.   Heart disease.   Ear infections.  WHY IS SMOKING ADDICTIVE? Nicotine is the chemical agent in tobacco that is capable of causing addiction or dependence. When you smoke and inhale, nicotine is absorbed rapidly into the bloodstream through your lungs. Both inhaled and noninhaled nicotine may be addictive.  WHAT ARE THE BENEFITS OF QUITTING?  There are many health benefits to quitting smoking. Some are:   The likelihood of  developing cancer and heart disease decreases. Health improvements are seen almost immediately.   Blood pressure, pulse rate, and breathing patterns start returning to normal soon after quitting.   People who quit may see an improvement in their overall quality of life.  HOW DO YOU QUIT SMOKING? Smoking is an addiction with both physical and psychological effects, and longtime habits can be hard to change. Your health care provider can recommend:  Programs and community resources, which may include group support, education, or therapy.  Replacement products, such as patches, gum, and nasal sprays. Use these products only as directed. Do not replace cigarette smoking with electronic cigarettes (commonly called e-cigarettes). The safety of e-cigarettes is unknown, and some may contain harmful chemicals. FOR MORE INFORMATION  American Lung Association: www.lung.org  American Cancer Society: www.cancer.org Document Released: 03/07/2004 Document Revised: 11/18/2012 Document Reviewed: 07/20/2012 [facility] Patient Information 2014 [place], [place].     Emergency Department Resource Guide 1) Find a Doctor and Pay Out of Pocket Although you won't have to find out who is covered by your insurance plan, it is a good idea to ask around and get recommendations. You will then need to call the office and see if the doctor you have chosen will accept you as a new patient and what types of options they offer for patients who are self-pay. Some doctors offer discounts or will set up payment plans for their patients who do not have insurance, but you will need to ask so you aren't surprised when you get to your appointment.  2) Contact Your Local Health Department Not all health departments have doctors that can see patients for sick visits, but many do, so it is worth a call to see if yours does. If you don't know where your local health department is, you can check in your phone book. The CDC also has a  tool to help you locate your state's health department, and many state websites also have listings of all of their local health departments.  3) Find a [facility] If your illness is not likely to be very severe or complicated, you may want to try a walk in clinic. These are popping up all over the country in pharmacies, drugstores, and shopping centers. They're usually staffed by nurse practitioners or physician assistants that have been trained to treat common illnesses and complaints. They're usually fairly quick and inexpensive. However, if you have serious medical issues or chronic medical problems, these are probably not your best option.  No Primary Care Doctor: - Call Health Connect at  [phone] - they can help you locate a primary care doctor  that  accepts your insurance, provides certain services, etc. - Physician Referral Service- [phone]  Chronic Pain Problems: Organization         Address  Phone   Notes  [facility]  [phone] Patients need to be referred by their primary care doctor.   Medication Assistance: Organization         Address  Phone   Notes  [facility] Medication [facility] [address] [place], [place] [address] [phone] --Must be a resident of [facility] -- Must have NO insurance coverage whatsoever (no Medicaid/ Medicare, etc.) -- The pt. MUST have a primary care doctor that directs their care regularly and follows them in the community   MedAssist  [phone]   [employer]  [phone]    Agencies that provide inexpensive medical care: Organization         Address  Phone   Notes  [place]  [phone]   [facility]    [phone]   [facility] [place], [place] [address] [phone]   [facility] [address] [place] [phone]   Planned Parenthood    [phone]    [facility]    [phone]   [place] and [address], [place] Phone:  [phone], Fax:  [phone] Hours of Operation:  9 am - 6 pm, M-F.  Also accepts Medicaid/Medicare and self-pay.  [facility] for [place] [address], [address], [place] Phone: [phone], Fax: [phone]. Hours of Operation:  8:30 am - 5:30 pm, M-F.  Also accepts Medicaid and self-pay.  [facility] [place] [address] [place] Phone: [phone]   [place], [place], [place] [phone], Ext. [phone] Mondays & Thursdays: 7-9 AM.  First 15 patients are seen on a first come, first serve basis.    [place] Providers:  Organization         Address  Phone   Notes  [facility] [address], [place] [phone] Also accepts self-pay patients.  [facility] [id number] [place], [place]  [phone]   [place], [address], [place] [phone]   [facility] [address] [place] [phone]   Lucianne Lei [address], [place]   [phone] Only accepts [place] Access [place] patients after they have their name applied to their card.   Self-Pay (no insurance) in [facility]:  Organization         Address  Phone   Notes  Sickle Cell Patients, [facility] [place] [phone]   [facility]gent Care [place] [phone]   [facility] [place]  [place], [place], [place] [phone]   [facility]/Dr. Osei-Bonsu  [address] [place] or [address], [place] [phone] Phone number for both [place] and [place] locations is the same.  Urgent Medical and [facility] [address] [place] [phone]   [facility] [address], [place] or [address] [phone]  [facility] [place] [phone], phone; [phone], fax Sees patients 1st and 3rd Saturday of every month.  Must not qualify for public or private insurance (i.e. Medicaid, Medicare, [place] Health Choice, Veterans' Benefits)  Household income should be no more than 200% of the poverty level The clinic cannot treat you if you are pregnant or think you are pregnant  Sexually transmitted diseases are not treated at the clinic.    Dental Care: Organization         Address  Phone  Notes  [facility] Department of [facility] [facility] [phone] Accepts children up to age 41 who are enrolled in [place] or [place]; pregnant women with a Medicaid card; and children who have applied for Medicaid or [place] Health Choice, but were declined, whose parents can pay a reduced fee at time of service.  [facility] Department of [facility]  [address], [place] [phone] Accepts children up to age 69 who are enrolled in [place] or [place]; pregnant women with a Medicaid card; and children who have applied for Medicaid or [place] Health Choice, but were declined, whose parents can pay a reduced fee at time of service.  [employer] Adult Dental Access PROGRAM  [place] [phone] Patients are seen by appointment only. Walk-ins are not accepted. [place] will see patients 3 years of age and older. Monday - Tuesday (8am-5pm) Most Wednesdays (8:30-5pm) $30 per visit, cash only  [facility] Adult Dental Access PROGRAM  [address], [facility] [phone] Patients are seen by appointment only. Walk-ins are not accepted. [place] will see patients 25 years of age and older. One Wednesday  Evening (Monthly: Volunteer Based).  $30 per visit, cash only  [place]  [phone] for adults; Children under age 41, call Graduate Pediatric Dentistry at [phone]. Children aged 32-14, please call [phone] to request a pediatric application.  Dental services are provided in all areas of dental care including fillings, crowns and bridges, complete and partial dentures, implants, gum treatment, root canals, and extractions. Preventive care is also provided. Treatment is provided to both adults and children. Patients are selected via a lottery and there is often a waiting list.   [facility] [address] [place]  [phone] [email or web address]   [facility] [address] [place], [place] [phone], Ext. [phone] Second and Fourth Thursday of each month, opens at 6:30 AM; Clinic ends at 9 AM.  Patients are seen on a first-come first-served basis, and a limited number are seen during each clinic.   [facility]  [address] Hillard Danker [place], [place] [phone]   Eligibility Requirements You must have lived in [place], [place], or [place] counties for at least the last three months.   You cannot be eligible for state or federal sponsored [employer], including [employer], [place], or [employer].   You generally cannot be eligible for healthcare insurance through your employer.    How to apply: Eligibility screenings are held every Tuesday and Wednesday afternoon from 1:00 pm until 4:00 pm. You do not need an appointment for the interview!  [facility] [address], [place], [place]   [place]  [place] Department  [place] Department  [place] in the Community: Intensive Outpatient Programs Organization         Address  Phone  Notes  [place]. [address], [place], [place] [phone]   [facility] Outpatient [address], [place], [place]   ADS: Alcohol & Drug Svcs [address], [place], [place]   [place] [address],  [place], [place] or [phone]   Substance Abuse Resources Organization         Address  Phone  Notes  Alcohol and Drug Services  [phone]   [place]  [phone]   [facility] [place]   Chinita Pester  [phone]   Residential & Outpatient Substance Abuse Program  [phone]   Psychological Services Organization         Address  Phone  Notes  [facility]  [place]  [phone]   [place] [address] [place] or [phone]    Mobile Crisis Teams Organization         Address  Phone  Notes  [facility], Mobile Crisis Care Unit  [phone]   [facility]  [address] [place], [place]   [place] [address] [place] [phone]    Self-Help/Support Groups Organization         Address  Phone             Notes  [place]. of [place] - variety of support groups  Christmas Call for more information  Narcotics Anonymous (NA), [facility] [address], [employer] [place]  2 meetings at this location   Special educational needs teacher         Address  Phone  Notes  ASAP Residential Treatment [place],    [place]  [phone]   [facility]  [address] [place] [address], [place], [place]   [place], [place] [phone] Admissions: 8am-3pm M-F  Incentives Substance [place] [address]    [place], [place] [phone]   [facility] [address] [place],  [place], [place]   [facility] [address]  [place], [place]   Insight Programs - Intensive Outpatient [address] Kristeen Mans [address], [place], [place]   [facility] ([place].,  [place], [place] [phone] or [phone]   [facility]idential Treatment Services (RTS) [address] [place], [place] Accepts Medicaid  [facility] [place] [address],  [place] [phone] Substance Abuse/Addiction Treatment   [facility] Organization         Address  Phone  Notes  [facility]  [phone]   Domenic Schwab, PhD [address] [place]lis [place], [place]   [phone] or [phone]   [place], [place] [phone]   [place] [address] [place], [place] [phone] Insurance/Medicaid/sponsorship through [employer] and Families [address] [place]                                    [place], [place] [phone] [place] [phone]  [address], [place] [phone]    Dr. Adele Schilder  [phone]   [facility] Dept. 1) [address] [place] 2) [place] 3)  [place] [address], [place] [phone]  [phone]   [place] [phone] or [phone] (After Hours)

## 2013-03-21 NOTE — ED Notes (Signed)
Bed: WA07 Expected date: 03/21/13 Expected time: 12:11 PM Means of arrival: Ambulance Comments: N/V chills

## 2013-03-21 NOTE — ED Notes (Signed)
Per EMS, concerned citizen called EMS for patient.  Patient questioned about status.  Pt c/o abdominal pain since last pm.  Could not verify what he ate.  Had open can of food beside pt.  ? Look by paramedic for safety.  Pt c/o pain to all 4 quadrants.  122/78, 80, resp 14, 98% ra.

## 2013-03-23 ENCOUNTER — Encounter (HOSPITAL_COMMUNITY): Payer: Self-pay | Admitting: Emergency Medicine

## 2013-03-23 ENCOUNTER — Emergency Department (HOSPITAL_COMMUNITY)
Admission: EM | Admit: 2013-03-23 | Discharge: 2013-03-23 | Disposition: A | Discharge status: Home / Self Care | Payer: Medicare Other | Attending: Emergency Medicine | Admitting: Emergency Medicine

## 2013-03-23 ENCOUNTER — Emergency Department (HOSPITAL_COMMUNITY): Payer: Medicare Other

## 2013-03-23 DIAGNOSIS — Z8739 Personal history of other diseases of the musculoskeletal system and connective tissue: Secondary | ICD-10-CM | POA: Insufficient documentation

## 2013-03-23 DIAGNOSIS — Z5900 Homelessness unspecified: Secondary | ICD-10-CM | POA: Insufficient documentation

## 2013-03-23 DIAGNOSIS — Z8781 Personal history of (healed) traumatic fracture: Secondary | ICD-10-CM | POA: Insufficient documentation

## 2013-03-23 DIAGNOSIS — Z8701 Personal history of pneumonia (recurrent): Secondary | ICD-10-CM | POA: Insufficient documentation

## 2013-03-23 DIAGNOSIS — Z8659 Personal history of other mental and behavioral disorders: Secondary | ICD-10-CM | POA: Insufficient documentation

## 2013-03-23 DIAGNOSIS — Z880 Allergy status to penicillin: Secondary | ICD-10-CM | POA: Insufficient documentation

## 2013-03-23 DIAGNOSIS — J441 Chronic obstructive pulmonary disease with (acute) exacerbation: Secondary | ICD-10-CM | POA: Insufficient documentation

## 2013-03-23 DIAGNOSIS — E119 Type 2 diabetes mellitus without complications: Secondary | ICD-10-CM | POA: Insufficient documentation

## 2013-03-23 DIAGNOSIS — F17200 Nicotine dependence, unspecified, uncomplicated: Secondary | ICD-10-CM | POA: Insufficient documentation

## 2013-03-23 DIAGNOSIS — Z862 Personal history of diseases of the blood and blood-forming organs and certain disorders involving the immune mechanism: Secondary | ICD-10-CM | POA: Insufficient documentation

## 2013-03-23 DIAGNOSIS — Z9089 Acquired absence of other organs: Secondary | ICD-10-CM | POA: Insufficient documentation

## 2013-03-23 DIAGNOSIS — I10 Essential (primary) hypertension: Secondary | ICD-10-CM | POA: Insufficient documentation

## 2013-03-23 DIAGNOSIS — Z8719 Personal history of other diseases of the digestive system: Secondary | ICD-10-CM | POA: Insufficient documentation

## 2013-03-23 DIAGNOSIS — R110 Nausea: Secondary | ICD-10-CM | POA: Insufficient documentation

## 2013-03-23 LAB — CBC WITH DIFFERENTIAL/PLATELET
BASOS PCT: 1 % (ref 0–1)
Basophils Absolute: 0 10*3/uL (ref 0.0–0.1)
EOS ABS: 0.2 10*3/uL (ref 0.0–0.7)
Eosinophils Relative: 3 % (ref 0–5)
HCT: 31.5 % — ABNORMAL LOW (ref 39.0–52.0)
Hemoglobin: 9.8 g/dL — ABNORMAL LOW (ref 13.0–17.0)
LYMPHS ABS: 1.5 10*3/uL (ref 0.7–4.0)
Lymphocytes Relative: 21 % (ref 12–46)
MCH: 24.2 pg — ABNORMAL LOW (ref 26.0–34.0)
MCHC: 31.1 g/dL (ref 30.0–36.0)
MCV: 77.8 fL — ABNORMAL LOW (ref 78.0–100.0)
MONOS PCT: 10 % (ref 3–12)
Monocytes Absolute: 0.7 10*3/uL (ref 0.1–1.0)
NEUTROS PCT: 65 % (ref 43–77)
Neutro Abs: 4.6 10*3/uL (ref 1.7–7.7)
PLATELETS: 379 10*3/uL (ref 150–400)
RBC: 4.05 MIL/uL — ABNORMAL LOW (ref 4.22–5.81)
RDW: 17.1 % — ABNORMAL HIGH (ref 11.5–15.5)
WBC: 7 10*3/uL (ref 4.0–10.5)

## 2013-03-23 LAB — COMPREHENSIVE METABOLIC PANEL
ALBUMIN: 3 g/dL — AB (ref 3.5–5.2)
ALT: 10 U/L (ref 0–53)
AST: 16 U/L (ref 0–37)
Alkaline Phosphatase: 133 U/L — ABNORMAL HIGH (ref 39–117)
BUN: 27 mg/dL — AB (ref 6–23)
CALCIUM: 8.9 mg/dL (ref 8.4–10.5)
CO2: 23 mEq/L (ref 19–32)
Chloride: 103 mEq/L (ref 96–112)
Creatinine, Ser: 1.21 mg/dL (ref 0.50–1.35)
GFR calc non Af Amer: 54 mL/min — ABNORMAL LOW (ref >90)
GFR, EST AFRICAN AMERICAN: 63 mL/min — AB (ref >90)
GLUCOSE: 106 mg/dL — AB (ref 70–99)
POTASSIUM: 4.3 meq/L (ref 3.7–5.3)
Sodium: 139 mEq/L (ref 137–147)
Total Bilirubin: <0.2 mg/dL — ABNORMAL LOW (ref 0.3–1.2)
Total Protein: 7.5 g/dL (ref 6.0–8.3)

## 2013-03-23 LAB — LIPASE, BLOOD: Lipase: 56 U/L (ref 11–59)

## 2013-03-23 LAB — POCT I-STAT TROPONIN I: TROPONIN I, POC: 0.01 ng/mL (ref 0.00–0.08)

## 2013-03-23 MED ORDER — AZITHROMYCIN 250 MG PO TABS: 250.0000 mg | ORAL_TABLET | Freq: Every day | ORAL | Status: DC

## 2013-03-23 MED ORDER — ALBUTEROL SULFATE HFA 108 (90 BASE) MCG/ACT IN AERS: 1.0000 | INHALATION_SPRAY | RESPIRATORY_TRACT | Status: DC | PRN

## 2013-03-23 MED ORDER — IPRATROPIUM-ALBUTEROL 0.5-2.5 (3) MG/3ML IN SOLN
3.0000 mL | Freq: Once | RESPIRATORY_TRACT | Status: AC
Start: 2013-03-23 — End: 2013-03-23
  Administered 2013-03-23: 3 mL via RESPIRATORY_TRACT
  Filled 2013-03-23: qty 3

## 2013-03-23 MED ORDER — PREDNISONE 20 MG PO TABS
60.0000 mg | ORAL_TABLET | Freq: Once | ORAL | Status: AC
  Administered 2013-03-23: 60 mg via ORAL
  Filled 2013-03-23: qty 3

## 2013-03-23 MED ORDER — ALBUTEROL SULFATE HFA 108 (90 BASE) MCG/ACT IN AERS: 2.0000 | INHALATION_SPRAY | RESPIRATORY_TRACT | Status: DC | PRN

## 2013-03-23 MED ORDER — SODIUM CHLORIDE 0.9 % IV BOLUS (SEPSIS)
1000.0000 mL | Freq: Once | INTRAVENOUS | Status: AC
  Administered 2013-03-23: 1000 mL via INTRAVENOUS

## 2013-03-23 MED ORDER — AZITHROMYCIN 250 MG PO TABS
500.0000 mg | ORAL_TABLET | Freq: Once | ORAL | Status: AC
  Administered 2013-03-23: 500 mg via ORAL
  Filled 2013-03-23: qty 2

## 2013-03-23 MED ORDER — ONDANSETRON HCL 4 MG/2ML IJ SOLN
4.0000 mg | Freq: Once | INTRAMUSCULAR | Status: AC
  Administered 2013-03-23: 4 mg via INTRAVENOUS
  Filled 2013-03-23: qty 2

## 2013-03-23 MED ORDER — PREDNISONE 20 MG PO TABS: 60.0000 mg | ORAL_TABLET | Freq: Every day | ORAL | Status: DC

## 2013-03-23 NOTE — Discharge Instructions (Signed)
Chronic Obstructive Pulmonary Disease  Chronic obstructive pulmonary disease (COPD) is a common lung condition in which airflow from the lungs is limited. COPD is a general term that can be used to describe many different lung problems that limit airflow, including both chronic bronchitis and emphysema.  If you have COPD, your lung function will probably never return to normal, but there are measures you can take to improve lung function and make yourself feel better.   CAUSES   · Smoking (common).    · Exposure to secondhand smoke.    · Genetic problems.  · Chronic inflammatory lung diseases or recurrent infections.  SYMPTOMS   · Shortness of breath, especially with physical activity.    · Deep, persistent (chronic) cough with a large amount of thick mucus.    · Wheezing.    · Rapid breaths (tachypnea).    · Gray or bluish discoloration (cyanosis) of the skin, especially in fingers, toes, or lips.    · Fatigue.    · Weight loss.    · Frequent infections or episodes when breathing symptoms become much worse (exacerbations).    · Chest tightness.  DIAGNOSIS   Your healthcare provider will take a medical history and perform a physical examination to make the initial diagnosis.  Additional tests for COPD may include:   · Lung (pulmonary) function tests.  · Chest X-ray.  · CT scan.  · Blood tests.  TREATMENT   Treatment available to help you feel better when you have COPD include:   · Inhaler and nebulizer medicines. These help manage the symptoms of COPD and make your breathing more comfortable  · Supplemental oxygen. Supplemental oxygen is only helpful if you have a low oxygen level in your blood.    · Exercise and physical activity. These are beneficial for nearly all people with COPD. Some people may also benefit from a pulmonary rehabilitation program.  HOME CARE INSTRUCTIONS   · Take all medicines (inhaled or pills) as directed by your health care provider.  · Only take over-the-counter or prescription medicines  for pain, fever, or discomfort as directed by your health care provider.    · Avoid over-the-counter medicines or cough syrups that dry up your airway (such as antihistamines) and slow down the elimination of secretions unless instructed otherwise by your healthcare provider.    · If you are a smoker, the most important thing that you can do is stop smoking. Continuing to smoke will cause further lung damage and breathing trouble. Ask your health care provider for help with quitting smoking. He or she can direct you to community resources or hospitals that provide support.  · Avoid exposure to irritants such as smoke, chemicals, and fumes that aggravate your breathing.  · Use oxygen therapy and pulmonary rehabilitation if directed by your health care provider. If you require home oxygen therapy, ask your healthcare provider whether you should purchase a pulse oximeter to measure your oxygen level at home.    · Avoid contact with individuals who have a contagious illness.  · Avoid extreme temperature and humidity changes.  · Eat healthy foods. Eating smaller, more frequent meals and resting before meals may help you maintain your strength.  · Stay active, but balance activity with periods of rest. Exercise and physical activity will help you maintain your ability to do things you want to do.  · Preventing infection and hospitalization is very important when you have COPD. Make sure to receive all the vaccines your health care provider recommends, especially the pneumococcal and influenza vaccines. Ask your healthcare provider whether you   in (inhaling) through your nose for 1 second. Then, purse your lips as if you were going to whistle and breathe out (exhale)  through the pursed lips for 2 seconds.   Diaphragmatic breathing. Start by putting one hand on your abdomen just above your waist. Inhale slowly through your nose. The hand on your abdomen should move out. Then purse your lips and exhale slowly. You should be able to feel the hand on your abdomen moving in as you exhale.   Learn and use controlled coughing to clear mucus from your lungs. Controlled coughing is a series of short, progressive coughs. The steps of controlled coughing are:  1. Lean your head slightly forward.  2. Breathe in deeply using diaphragmatic breathing.  3. Try to hold your breath for 3 seconds.  4. Keep your mouth slightly open while coughing twice.  5. Spit any mucus out into a tissue.  6. Rest and repeat the steps once or twice as needed. SEEK MEDICAL CARE IF:   You are coughing up more mucus than usual.   There is a change in the color or thickness of your mucus.   Your breathing is more labored than usual.   Your breathing is faster than usual.  SEEK IMMEDIATE MEDICAL CARE IF:   You have shortness of breath while you are resting.   You have shortness of breath that prevents you from:  Being able to talk.   Performing your usual physical activities.   You have chest pain lasting longer than 5 minutes.   Your skin color is more cyanotic than usual.  You measure low oxygen saturations for longer than 5 minutes with a pulse oximeter. MAKE SURE YOU:   Understand these instructions.  Will watch your condition.  Will get help right away if you are not doing well or get worse. Document Released: 11/07/2004 Document Revised: 11/18/2012 Document Reviewed: 09/24/2012 [facility] Patient Information 2014 [place], [place].  Nausea and Vomiting Nausea is a sick feeling that often comes before throwing up (vomiting). Vomiting is a reflex where stomach contents come out of your mouth. Vomiting can cause severe loss of body fluids (dehydration).  Children and elderly adults can become dehydrated quickly, especially if they also have diarrhea. Nausea and vomiting are symptoms of a condition or disease. It is important to find the cause of your symptoms. CAUSES   Direct irritation of the stomach lining. This irritation can result from increased acid production (gastroesophageal reflux disease), infection, food poisoning, taking certain medicines (such as nonsteroidal anti-inflammatory drugs), alcohol use, or tobacco use.  Signals from the brain.These signals could be caused by a headache, heat exposure, an inner ear disturbance, increased pressure in the brain from injury, infection, a tumor, or a concussion, pain, emotional stimulus, or metabolic problems.  An obstruction in the gastrointestinal tract (bowel obstruction).  Illnesses such as diabetes, hepatitis, gallbladder problems, appendicitis, kidney problems, cancer, sepsis, atypical symptoms of a heart attack, or eating disorders.  Medical treatments such as chemotherapy and radiation.  Receiving medicine that makes you sleep (general anesthetic) during surgery. DIAGNOSIS Your caregiver may ask for tests to be done if the problems do not improve after a few days. Tests may also be done if symptoms are severe or if the reason for the nausea and vomiting is not clear. Tests may include:  Urine tests.  Blood tests.  Stool tests.  Cultures (to look for evidence of infection).  X-rays or other imaging studies. Test results can help your caregiver make  decisions about treatment or the need for additional tests. TREATMENT You need to stay well hydrated. Drink frequently but in small amounts.You may wish to drink water, sports drinks, clear broth, or eat frozen ice pops or gelatin dessert to help stay hydrated.When you eat, eating slowly may help prevent nausea.There are also some antinausea medicines that may help prevent nausea. HOME CARE INSTRUCTIONS   Take all medicine as  directed by your caregiver.  If you do not have an appetite, do not force yourself to eat. However, you must continue to drink fluids.  If you have an appetite, eat a normal diet unless your caregiver tells you differently.  Eat a variety of complex carbohydrates (rice, wheat, potatoes, bread), lean meats, yogurt, fruits, and vegetables.  Avoid high-fat foods because they are more difficult to digest.  Drink enough water and fluids to keep your urine clear or pale yellow.  If you are dehydrated, ask your caregiver for specific rehydration instructions. Signs of dehydration may include:  Severe thirst.  Dry lips and mouth.  Dizziness.  Dark urine.  Decreasing urine frequency and amount.  Confusion.  Rapid breathing or pulse. SEEK IMMEDIATE MEDICAL CARE IF:   You have blood or brown flecks (like coffee grounds) in your vomit.  You have black or bloody stools.  You have a severe headache or stiff neck.  You are confused.  You have severe abdominal pain.  You have chest pain or trouble breathing.  You do not urinate at least once every 8 hours.  You develop cold or clammy skin.  You continue to vomit for longer than 24 to 48 hours.  You have a fever. MAKE SURE YOU:   Understand these instructions.  Will watch your condition.  Will get help right away if you are not doing well or get worse. Document Released: 01/28/2005 Document Revised: 04/22/2011 Document Reviewed: 06/27/2010 [facility] Patient Information 2014 [place], [place].    Emergency Department Resource Guide 1) Find a Doctor and Pay Out of Pocket Although you won't have to find out who is covered by your insurance plan, it is a good idea to ask around and get recommendations. You will then need to call the office and see if the doctor you have chosen will accept you as a new patient and what types of options they offer for patients who are self-pay. Some doctors offer discounts or will set up payment  plans for their patients who do not have insurance, but you will need to ask so you aren't surprised when you get to your appointment.  2) Contact Your Local Health Department Not all health departments have doctors that can see patients for sick visits, but many do, so it is worth a call to see if yours does. If you don't know where your local health department is, you can check in your phone book. The CDC also has a tool to help you locate your state's health department, and many state websites also have listings of all of their local health departments.  3) Find a Walk-in Clinic If your illness is not likely to be very severe or complicated, you may want to try a walk in clinic. These are popping up all over the country in pharmacies, drugstores, and shopping centers. They're usually staffed by nurse practitioners or physician assistants that have been trained to treat common illnesses and complaints. They're usually fairly quick and inexpensive. However, if you have serious medical issues or chronic medical problems, these are probably not your  best option.  No Primary Care Doctor: - Call Health Connect at  [phone] - they can help you locate a primary care doctor that  accepts your insurance, provides certain services, etc. - Physician Referral Service- [phone]  Chronic Pain Problems: Organization         Address  Phone   Notes  [facility]  [phone] Patients need to be referred by their primary care doctor.   Medication Assistance: Organization         Address  Phone   Notes  [facility] Medication [facility] [address] [place], [place] [address] [phone] --Must be a resident of [facility] -- Must have NO insurance coverage whatsoever (no Medicaid/ Medicare, etc.) -- The pt. MUST have a primary care doctor that directs their care regularly and follows them in the community   Med[facility]ist  [phone]   [employer]   [phone]    Agencies that provide inexpensive medical care: Organization         Address  Phone   Notes  [place]  [phone]   [facility]    [phone]   [facility] [place], [place] [address] [phone]   [place] [address] [place] [phone]   Planned Parenthood    [phone]   [facility]    [phone]   [place] and [address], [place] Phone:  [phone], Fax:  [phone] Hours of Operation:  9 am - 6 pm, M-F.  Also accepts Medicaid/Medicare and self-pay.  [facility] for [place], [address], [place] Phone: [phone], Fax: [phone]. Hours of Operation:  8:30 am - 5:30 pm, M-F.  Also accepts Medicaid and self-pay.  [facility] [place] [address] [place] Phone: [phone]   [place], [place], [place] [phone], Ext. [phone] Mondays & Thursdays: 7-9 AM.  First 15 patients are seen on a first come, first serve basis.    [place] Providers:  Organization         Address  Phone   Notes  [facility] [address], [place] [phone] Also accepts self-pay patients.  [facility] [address], [place]  [phone]   [place], [address], [place] [phone]   [facility] [address], [place] [phone]   Lucianne Lei [address], [place]   [phone] Only accepts [place] Access [place] patients after they have their name applied to their card.   Self-Pay (no insurance) in [facility]:  Organization         Address  Phone   Notes  Sickle Cell Patients, [facility] [place] [phone]   [facility] [place] [phone]   [facility] [place]  [place], [place], [place] [phone]   [facility]/Dr. Osei-Bonsu  [address], [place] or [address], [place] [phone] Phone number for both [place] and [place] locations is the same.  Urgent Medical and [facility] [address],  [place] [phone]   [facility] [place] [address] [place] [address], [place] or [address] [address] [phone]   [facility] [address], [place] [phone]) [phone], phone; [phone], fax Sees patients 1st and 3rd Saturday of every month.  Must not qualify for public or private insurance (i.e. Medicaid, Medicare, [place] Health Choice, Veterans' Benefits)  Household income should be no more than 200% of the poverty level The clinic cannot treat you if you are pregnant or think you are pregnant  Sexually transmitted diseases are not treated at the clinic.    Dental Care: Organization         Address  Phone  Notes  [facility] [address], [place] [phone] Accepts children up to age 78 who are enrolled in [place]icaid or [place] Health Choice; pregnant women with a Medicaid card; and children who have applied for Medicaid or [place] Health Choice, but were declined, whose parents can pay a reduced fee at time of service.  [facility] [place]  [address], [place] [phone] Accepts children up to age 78 who are enrolled in [place]icaid or [place] Health Choice; pregnant women with a Medicaid card; and children who have applied for Medicaid or [place] Health Choice, but were declined, whose parents can pay a reduced fee at time of service.  [facility]  [address], [place] [phone] Patients are  seen by appointment only. Walk-ins are not accepted. [facility] will see patients 78 years of age and older. Monday - Tuesday (8am-5pm) Most Wednesdays (8:30-5pm) $30 per visit, cash only  [facility]  [address], [facility] [phone] Patients are seen by appointment only. Walk-ins are not accepted. [facility] will see patients 78 years of age and older. One Wednesday Evening (Monthly: Volunteer Based).  $30 per visit, cash only  [employer] [facility]  [phone] for adults; Children under age 414, call Graduate Pediatric Dentistry at [phone]. Children aged 244-14, please call [phone] to request a pediatric application.  Dental services are provided in all areas of dental care including fillings, crowns and bridges, complete and partial dentures, implants, gum treatment, root canals, and extractions. Preventive care is also provided. Treatment is provided to both adults and children. Patients are selected via a lottery and there is often a waiting list.   [facility] [address], [place]  [phone] [email or web address]   [facility] [address], [place], [place] [phone], Ext. [phone] Second and Fourth Thursday of each month, opens at 6:30 AM; Clinic ends at 9 AM.  Patients are seen on a first-come first-served basis, and a limited number are seen during each clinic.   [facility]  [address], [place], [place] [phone]   Eligibility Requirements You must have lived in [place], [place], or [place] counties for at least the last three months.   You cannot be eligible for state or federal sponsored National Cityhealthcare insurance, including CIGNAVeterans Administration, [place]icaid, or [employer]icare.   You generally cannot be eligible for healthcare insurance through your employer.    How to apply: Eligibility screenings are held every Tuesday and Wednesday afternoon from 1:00 pm until 4:00  pm. You do not need an appointment for the interview!  [facility] [address],  [place], [place]   [place]  [place]  [place]  [phone]    Behavioral Health Resources in the Community: Intensive Outpatient Programs Organization         Address  Phone  Notes  [place] [address] [place] [place], [place] [phone]   [facility] [address], [place], [place]   ADS: Alcohol & Drug Svcs [address] [place], [place]   [place] [address],  [place], [place] or [phone]   Substance Abuse Resources Organization         Address  Phone  Notes  Alcohol and Drug Services  [phone]   [place]  [phone]   [facility] [place]   Chinita Pester  [phone]   Residential & Outpatient Substance Abuse Program  [phone]   Psychological Services Organization         Address  Phone  Notes  [employer] [place]  [place]  [phone]   [place] [address] [place] or [phone]    Mobile Crisis Teams Organization         Address  Phone  Notes  [facility], Mobile Crisis Care Unit  [phone]   [facility]  [address] [place], [place]   [place] [address], [place] [phone]    Self-Help/Support Groups Organization         Address  Phone             Notes  [place]. of [place] - variety of support groups  [place] Call for more information  Narcotics Anonymous (NA), [facility] [address], [employer] [place]  2 meetings at this location   Special educational needs teacher          Address  Phone  Notes  ASAP Residential Treatment [place],    [place]  [phone]   [facility]  [address] [place] [address], [place], [place]   [place], [place] [phone] Admissions: 8am-3pm M-F  Incentives Substance [place] [address]    [place], [place] [phone]   [facility] [address] [place], [place], [place]   [facility] [address]  [place], [place]   [facility] Programs - Intensive Outpatient [address] Kristeen [address], [place], [place]   [facility] [address].,  [place], [place] [phone] or [phone]   [facility] (RTS) [address] [place], [place] Accepts Medicaid  [facility] [place] [address],  [place] [phone] Substance Abuse/Addiction Treatment   [facility] Organization         Address  Phone  Notes  [facility]  [phone]   Domenic Schwab, PhD [address] Arlis Porta [place], [place]   [phone] or [phone]   [place], [place] [phone]   [place] [address], [place], [place] [phone] Insurance/Medicaid/sponsorship through [employer] and Families [address]  [place], [place] [phone] [place], [place] [phone]    Dr. Adele Schilder  [phone]   [facility] Dept. 1) [address] [place] 2) [place] 3)  [address], [place] [phone]  [phone]   [place] [phone] or [phone] (After Hours)

## 2013-03-23 NOTE — ED Provider Notes (Signed)
TIME SEEN: 4:45 PM  CHIEF COMPLAINT:  Nausea  HPI:  patient is an 80 year old male with a history of COPD, depression, hypertension who is homeless who presents the emergency department with complaints of nausea that started early this morning. He also reports that he has had intermittent chest pain but states this has been going on for "years". She's also had intermittent shortness of breath and wheezing he states this is also chronic due to his COPD. He denies any abdominal pain. No vomiting or diarrhea. No fevers or chills. No bloody stool or melena. He had a normal bowel movement this morning.  ROS: See HPI Constitutional: no fever  Eyes: no drainage  ENT: no runny nose   Cardiovascular:   intermittent chest pain  Resp: chronic  SOB  GI: no vomiting GU: no dysuria Integumentary: no rash  Allergy: no hives  Musculoskeletal: no leg swelling  Neurological: no slurred speech ROS otherwise negative  PAST MEDICAL HISTORY/PAST SURGICAL HISTORY:  Past Medical History  Diagnosis Date  . COPD (chronic obstructive pulmonary disease)   . Chronic back pain   . Pneumonia     01/2011 - CAP vs aspiration pneuonmia  . Depression   . PONV (postoperative nausea and vomiting)   . Hyponatremia     Previously felt secondary to SIADH  . Hypertension   . Upper GI bleed     01/2011 with EGD showing severe candida esophagitis and duodenal bulb erosion  . Anemia     In the setting of UGI bleed 01/2011 requiring blood transfusion  . Homelessness   . Dyspnea     Chronic, thought due to COPD. Echo 02/2010 with EF 30-35%, nuclear study negative, then repeat echo 03/2011 did not show systolic dysfxn, so unclear if truly HF  . Bronchitis   . Diabetes mellitus without complication   . Collar bone fracture     left    MEDICATIONS:  Prior to Admission medications   Not on File    ALLERGIES:  Allergies  Allergen Reactions  . Benadryl [Diphenhydramine Hcl] Other (See Comments)    Upset stomach   .  Penicillins Hives and Itching    SOCIAL HISTORY:  History  Substance Use Topics  . Smoking status: Current Some Day Smoker -- 1.00 packs/day for 60 years    Types: Cigarettes  . Smokeless tobacco: Never Used     Comment: Since age 78  . Alcohol Use: Yes     Comment: 174 ml of scotch weekly    FAMILY HISTORY: Family History  Problem Relation Age of Onset  . Coronary artery disease Father     Starting in his 16's. Died of massive MI at age 70.  . Schizophrenia Brother   . Coronary artery disease Sister     MI at age 49  . Alzheimer's disease Mother     EXAM: BP 155/74  Pulse 82  Temp(Src) 98.7 F (37.1 C) (Oral)  Resp 18  SpO2 97% CONSTITUTIONAL: Alert and oriented and responds appropriately to questions. Well-appearing; well-nourished, elderly, in no apparent distress  HEAD: Normocephalic EYES: Conjunctivae clear, PERRL ENT: normal nose; no rhinorrhea; moist mucous membranes; pharynx without lesions noted NECK: Supple, no meningismus, no LAD  CARD: RRR; S1 and S2 appreciated; no murmurs, no clicks, no rubs, no gallops RESP: Normal chest excursion without splinting or tachypnea; breath sounds  equal bilaterally with no rhonchi or rales, diffuse expiratory wheezing ABD/GI: Normal bowel sounds; non-distended; soft, non-tender, no rebound, no guarding BACK:  The  back appears normal and is non-tender to palpation, there is no CVA tenderness EXT: Normal ROM in all joints; non-tender to palpation; no edema; normal capillary refill; no cyanosis    SKIN: Normal color for age and race; warm NEURO: Moves all extremities equally PSYCH: The patient's mood and manner are appropriate. Grooming and personal hygiene are appropriate.  MEDICAL DECISION MAKING:  Patient here with 8 hours of constant nausea. EKG shows no ischemic changes. Will check abdominal labs, troponin. He currently denies any chest pain states his shortness of breath is chronic. Will give duo nebs. Will give fluids and  Zofran and reassess. His abdominal exam is completely benign, do not feel he needs Ct imaging at this time.  Will obtain xray to rule out obstruction.  ED PROGRESS: Patient's labs are unremarkable. His troponin is negative. Do not feel he needs serial sets of enzymes given he has had constant nausea for 8 hours. He has no other symptoms currently. Abdominal x-ray shows constipation but no obstruction. Will by mouth challenge and reassess.   6:46 PM  Pt's lungs are now clear to auscultation. He has no hypoxia. He has been eating and drinking without vomiting. He reports feeling much better. We'll discharge him with prescription for azithromycin and steroids for his COPD exacerbation, bronchitis. Discussed return precautions. Patient verbalizes understanding and is comfortable plan.   EKG Interpretation    Date/Time:  Tuesday March 23 2013 17:02:56 EST Ventricular Rate:  82 PR Interval:  206 QRS Duration: 98 QT Interval:  435 QTC Calculation: 508 R Axis:   60 Text Interpretation:  Sinus rhythm Probable left atrial enlargement Anteroseptal infarct, old No significant change since last tracing Confirmed by Siddhartha Hoback  DO, Jniyah Dantuono ([id number]) on 03/23/2013 5:09:47 PM              Stella, DO 03/23/13 0355

## 2013-03-23 NOTE — ED Notes (Signed)
Patient transported to X-ray 

## 2013-03-23 NOTE — ED Notes (Addendum)
Pt states that he has had nausea x 8 hours. Denies emesis or diarrhea. Pt is homeless.

## 2013-04-23 ENCOUNTER — Encounter (HOSPITAL_COMMUNITY): Payer: Self-pay | Admitting: Emergency Medicine

## 2013-04-23 ENCOUNTER — Emergency Department (HOSPITAL_COMMUNITY): Payer: Medicare Other

## 2013-04-23 ENCOUNTER — Emergency Department (HOSPITAL_COMMUNITY)
Admission: EM | Admit: 2013-04-23 | Discharge: 2013-04-24 | Disposition: A | Discharge status: Home / Self Care | Payer: Medicare Other | Attending: Emergency Medicine | Admitting: Emergency Medicine

## 2013-04-23 DIAGNOSIS — Z9089 Acquired absence of other organs: Secondary | ICD-10-CM | POA: Insufficient documentation

## 2013-04-23 DIAGNOSIS — E119 Type 2 diabetes mellitus without complications: Secondary | ICD-10-CM | POA: Insufficient documentation

## 2013-04-23 DIAGNOSIS — Z880 Allergy status to penicillin: Secondary | ICD-10-CM | POA: Insufficient documentation

## 2013-04-23 DIAGNOSIS — G8929 Other chronic pain: Secondary | ICD-10-CM | POA: Insufficient documentation

## 2013-04-23 DIAGNOSIS — Z862 Personal history of diseases of the blood and blood-forming organs and certain disorders involving the immune mechanism: Secondary | ICD-10-CM | POA: Insufficient documentation

## 2013-04-23 DIAGNOSIS — Z5900 Homelessness unspecified: Secondary | ICD-10-CM | POA: Insufficient documentation

## 2013-04-23 DIAGNOSIS — F17200 Nicotine dependence, unspecified, uncomplicated: Secondary | ICD-10-CM | POA: Insufficient documentation

## 2013-04-23 DIAGNOSIS — Z8719 Personal history of other diseases of the digestive system: Secondary | ICD-10-CM | POA: Insufficient documentation

## 2013-04-23 DIAGNOSIS — Z8659 Personal history of other mental and behavioral disorders: Secondary | ICD-10-CM | POA: Insufficient documentation

## 2013-04-23 DIAGNOSIS — I10 Essential (primary) hypertension: Secondary | ICD-10-CM | POA: Insufficient documentation

## 2013-04-23 DIAGNOSIS — Z8701 Personal history of pneumonia (recurrent): Secondary | ICD-10-CM | POA: Insufficient documentation

## 2013-04-23 DIAGNOSIS — Z8781 Personal history of (healed) traumatic fracture: Secondary | ICD-10-CM | POA: Insufficient documentation

## 2013-04-23 DIAGNOSIS — J441 Chronic obstructive pulmonary disease with (acute) exacerbation: Secondary | ICD-10-CM | POA: Insufficient documentation

## 2013-04-23 LAB — COMPREHENSIVE METABOLIC PANEL
ALK PHOS: 144 U/L — AB (ref 39–117)
ALT: 8 U/L (ref 0–53)
AST: 15 U/L (ref 0–37)
Albumin: 3.3 g/dL — ABNORMAL LOW (ref 3.5–5.2)
BUN: 27 mg/dL — ABNORMAL HIGH (ref 6–23)
CO2: 23 mEq/L (ref 19–32)
Calcium: 9 mg/dL (ref 8.4–10.5)
Chloride: 104 mEq/L (ref 96–112)
Creatinine, Ser: 1.03 mg/dL (ref 0.50–1.35)
GFR calc non Af Amer: 66 mL/min — ABNORMAL LOW (ref >90)
GFR, EST AFRICAN AMERICAN: 77 mL/min — AB (ref >90)
Glucose, Bld: 102 mg/dL — ABNORMAL HIGH (ref 70–99)
POTASSIUM: 4.1 meq/L (ref 3.7–5.3)
SODIUM: 141 meq/L (ref 137–147)
TOTAL PROTEIN: 7.4 g/dL (ref 6.0–8.3)
Total Bilirubin: <0.2 mg/dL — ABNORMAL LOW (ref 0.3–1.2)

## 2013-04-23 LAB — CBC
HCT: 33.7 % — ABNORMAL LOW (ref 39.0–52.0)
Hemoglobin: 10.2 g/dL — ABNORMAL LOW (ref 13.0–17.0)
MCH: 23.5 pg — ABNORMAL LOW (ref 26.0–34.0)
MCHC: 30.3 g/dL (ref 30.0–36.0)
MCV: 77.6 fL — ABNORMAL LOW (ref 78.0–100.0)
Platelets: 319 10*3/uL (ref 150–400)
RBC: 4.34 MIL/uL (ref 4.22–5.81)
RDW: 17.1 % — ABNORMAL HIGH (ref 11.5–15.5)
WBC: 9.3 10*3/uL (ref 4.0–10.5)

## 2013-04-23 LAB — I-STAT CHEM 8, ED
BUN: 26 mg/dL — ABNORMAL HIGH (ref 6–23)
CHLORIDE: 106 meq/L (ref 96–112)
CREATININE: 0.9 mg/dL (ref 0.50–1.35)
Calcium, Ion: 1.18 mmol/L (ref 1.13–1.30)
GLUCOSE: 92 mg/dL (ref 70–99)
HEMATOCRIT: 35 % — AB (ref 39.0–52.0)
Hemoglobin: 11.9 g/dL — ABNORMAL LOW (ref 13.0–17.0)
Potassium: 3.7 mEq/L (ref 3.7–5.3)
Sodium: 141 mEq/L (ref 137–147)
TCO2: 22 mmol/L (ref 0–100)

## 2013-04-23 LAB — TROPONIN I: Troponin I: <0.3 ng/mL (ref <0.30)

## 2013-04-23 MED ORDER — ALBUTEROL (5 MG/ML) CONTINUOUS INHALATION SOLN
10.0000 mg/h | INHALATION_SOLUTION | Freq: Once | RESPIRATORY_TRACT | Status: AC
  Administered 2013-04-23: 10 mg/h via RESPIRATORY_TRACT
  Filled 2013-04-23: qty 20

## 2013-04-23 MED ORDER — PREDNISONE 20 MG PO TABS
60.0000 mg | ORAL_TABLET | Freq: Once | ORAL | Status: AC
  Administered 2013-04-23: 60 mg via ORAL
  Filled 2013-04-23: qty 3

## 2013-04-23 MED ORDER — IPRATROPIUM BROMIDE 0.02 % IN SOLN
0.5000 mg | Freq: Once | RESPIRATORY_TRACT | Status: AC
  Administered 2013-04-23: 0.5 mg via RESPIRATORY_TRACT
  Filled 2013-04-23: qty 2.5

## 2013-04-23 MED ORDER — METHYLPREDNISOLONE SODIUM SUCC 125 MG IJ SOLR
125.0000 mg | Freq: Once | INTRAMUSCULAR | Status: DC
  Filled 2013-04-23: qty 2

## 2013-04-23 NOTE — ED Notes (Signed)
Bed: WA09 Expected date:  Expected time:  Means of arrival:  Comments: EMS 45 M Hx bronchitis

## 2013-04-23 NOTE — ED Notes (Signed)
Per EMS , pt. Claimed to be homeless, walked to a store and called 911 for complaint of cough with SOB. Pt. Has COPD and other respiratory problems for years as he claimed and the weather outside is making it worst.  Denies chest pain , claimed of chronic pain on back and knees.

## 2013-04-24 LAB — INFLUENZA PANEL BY PCR (TYPE A & B)
H1N1 flu by pcr: NOT DETECTED
Influenza A By PCR: NEGATIVE
Influenza B By PCR: NEGATIVE

## 2013-04-24 MED ORDER — PREDNISONE 20 MG PO TABS: 60.0000 mg | ORAL_TABLET | Freq: Every day | ORAL | Status: DC

## 2013-04-24 MED ORDER — ALBUTEROL SULFATE HFA 108 (90 BASE) MCG/ACT IN AERS
2.0000 | INHALATION_SPRAY | Freq: Once | RESPIRATORY_TRACT | Status: AC
  Administered 2013-04-24: 2 via RESPIRATORY_TRACT
  Filled 2013-04-24: qty 6.7

## 2013-04-24 NOTE — ED Provider Notes (Signed)
CSN: [id number]     Arrival date & time 04/23/13  2138 History   First MD Initiated Contact with Patient 04/23/13 2201     Chief Complaint  Patient presents with  . Shortness of Breath  . Cough     (Consider location/radiation/quality/duration/timing/severity/associated sxs/prior Treatment) HPI Comments: Patient is 80 year old male with PMHx significant for COPD, chronic lower back pain, PNA, HTN, bronchitis who presents to the ED with complaints of having "bronchitis"  He states that when the weather gets cold and he has to stay outside then he gets bronchitis - reports thick yellow sputum and wheezing.  He also reports chest pain with cough, denies fever but states that he has been chilled and "that's why I am here because I am cold".  He is also homeless and states that he is currently living in a tent.  He denies fever, headache, alcohol use, radiation of the pain, nausea, vomiting, abdominal pain, constipation or diarrhea.  Patient is a 80 y.o. male presenting with shortness of breath and cough. The history is provided by the patient. No language interpreter was used.  Shortness of Breath Severity:  Moderate Onset quality:  Gradual Duration:  1 day Timing:  Constant Progression:  Worsening Chronicity:  Chronic Context: smoke exposure and weather changes   Context: not activity, not emotional upset, not fumes, not known allergens and not strong odors   Relieved by:  Nothing Worsened by:  Nothing tried Ineffective treatments:  None tried Associated symptoms: cough, sputum production and wheezing   Associated symptoms: no abdominal pain, no chest pain, no diaphoresis, no ear pain, no fever, no headaches, no hemoptysis, no PND, no sore throat and no vomiting   Risk factors: tobacco use   Risk factors: no recent alcohol use   Cough Associated symptoms: shortness of breath and wheezing   Associated symptoms: no chest pain, no diaphoresis, no ear pain, no fever, no headaches and no sore  throat     Past Medical History  Diagnosis Date  . COPD (chronic obstructive pulmonary disease)   . Chronic back pain   . Pneumonia     01/2011 - CAP vs aspiration pneuonmia  . Depression   . PONV (postoperative nausea and vomiting)   . Hyponatremia     Previously felt secondary to SIADH  . Hypertension   . Upper GI bleed     01/2011 with EGD showing severe candida esophagitis and duodenal bulb erosion  . Anemia     In the setting of UGI bleed 01/2011 requiring blood transfusion  . Homelessness   . Dyspnea     Chronic, thought due to COPD. Echo 02/2010 with EF 30-35%, nuclear study negative, then repeat echo 03/2011 did not show systolic dysfxn, so unclear if truly HF  . Bronchitis   . Diabetes mellitus without complication   . Collar bone fracture     left   Past Surgical History  Procedure Laterality Date  . Tonsillectomy    . Vasectomy    . Appendectomy    . Tonsillectomy    . Colonoscopy    . Esophagogastroduodenoscopy    . Esophagogastroduodenoscopy  02/03/2011    Procedure: ESOPHAGOGASTRODUODENOSCOPY (EGD);  Surgeon: Juanita Craver, MD;  Location: [facility];  Service: Endoscopy;  Laterality: N/A;  . Esophagogastroduodenoscopy  02/03/2011    Procedure: ESOPHAGOGASTRODUODENOSCOPY (EGD);  Surgeon: Juanita Craver, MD;  Location: [facility];  Service: Endoscopy;  Laterality: N/A;  . Colonoscopy  01/30/2012    Procedure: COLONOSCOPY;  Surgeon:  Ladene Artist, MD,FACG;  Location: [facility];  Service: Endoscopy;  Laterality: N/A;   Family History  Problem Relation Age of Onset  . Coronary artery disease Father     Starting in his 70's. Died of massive MI at age 38.  . Schizophrenia Brother   . Coronary artery disease Sister     MI at age 9  . Alzheimer's disease Mother    History  Substance Use Topics  . Smoking status: Current Some Day Smoker -- 1.00 packs/day for 60 years    Types: Cigarettes  . Smokeless tobacco: Never Used     Comment: Since age 6  .  Alcohol Use: Yes     Comment: 174 ml of scotch weekly    Review of Systems  Constitutional: Negative for fever and diaphoresis.  HENT: Negative for ear pain and sore throat.   Respiratory: Positive for cough, sputum production, shortness of breath and wheezing. Negative for hemoptysis.   Cardiovascular: Negative for chest pain and PND.  Gastrointestinal: Negative for vomiting and abdominal pain.  Neurological: Negative for headaches.  All other systems reviewed and are negative.      Allergies  Benadryl and Penicillins  Home Medications  No current outpatient prescriptions on file. BP 141/68  Pulse 90  Temp(Src) 97.9 F (36.6 C) (Oral)  Resp 18  SpO2 95% Physical Exam  Nursing note and vitals reviewed. Constitutional: He is oriented to person, place, and time. He appears well-developed and well-nourished. No distress.  HENT:  Head: Normocephalic and atraumatic.  Right Ear: External ear normal.  Left Ear: External ear normal.  Nose: Nose normal.  Mouth/Throat: Oropharynx is clear and moist. No oropharyngeal exudate.  Eyes: Conjunctivae are normal. Pupils are equal, round, and reactive to light. No scleral icterus.  Neck: Normal range of motion. Neck supple.  Cardiovascular: Normal rate, regular rhythm and normal heart sounds.  Exam reveals no gallop and no friction rub.   No murmur heard. Pulmonary/Chest: Effort normal. No respiratory distress. He has wheezes. He has no rales. He exhibits no tenderness.  diffuse expiratory and inspiratory wheezing   Abdominal: Soft. Bowel sounds are normal. He exhibits no distension. There is no tenderness. There is no rebound.  Musculoskeletal: Normal range of motion. He exhibits no edema and no tenderness.  Lymphadenopathy:    He has no cervical adenopathy.  Neurological: He is alert and oriented to person, place, and time. He exhibits normal muscle tone. Coordination normal.  Skin: Skin is warm and dry. No rash noted. No erythema. No  pallor.  Psychiatric: He has a normal mood and affect. His behavior is normal. Judgment and thought content normal.    ED Course  Procedures (including critical care time) Labs Review Labs Reviewed  CBC - Abnormal; Notable for the following:    Hemoglobin 10.2 (*)    HCT 33.7 (*)    MCV 77.6 (*)    MCH 23.5 (*)    RDW 17.1 (*)    All other components within normal limits  COMPREHENSIVE METABOLIC PANEL - Abnormal; Notable for the following:    Glucose, Bld 102 (*)    BUN 27 (*)    Albumin 3.3 (*)    Alkaline Phosphatase 144 (*)    Total Bilirubin <0.2 (*)    GFR calc non Af Amer 66 (*)    GFR calc Af Amer 77 (*)    All other components within normal limits  I-STAT CHEM 8, ED - Abnormal; Notable for the following:  BUN 26 (*)    Hemoglobin 11.9 (*)    HCT 35.0 (*)    All other components within normal limits  TROPONIN I   Imaging Review Dg Chest 2 View  04/23/2013   CLINICAL DATA:  Chest pain, shortness of breath, cough, congestion. Smoker.  EXAM: CHEST  2 VIEW  COMPARISON:  03/23/2013.  FINDINGS: Borderline enlarged cardiac silhouette with a mild decrease in size. Decreased prominence of the pulmonary vasculature. The interstitial markings remain prominent and the lungs are hyperexpanded. No pleural fluid. Diffuse osteopenia. Old, healed left clavicle fracture.  IMPRESSION: 1. No acute abnormality. 2. Changes of COPD.   Electronically Signed   By: Enrique Sack M.D.   On: 04/23/2013 23:16     EKG Interpretation None      Results for orders placed during the hospital encounter of 04/23/13  CBC      Result Value Ref Range   WBC 9.3  4.0 - 10.5 K/uL   RBC 4.34  4.22 - 5.81 MIL/uL   Hemoglobin 10.2 (*) 13.0 - 17.0 g/dL   HCT 33.7 (*) 39.0 - 52.0 %   MCV 77.6 (*) 78.0 - 100.0 fL   MCH 23.5 (*) 26.0 - 34.0 pg   MCHC 30.3  30.0 - 36.0 g/dL   RDW 17.1 (*) 11.5 - 15.5 %   Platelets 319  150 - 400 K/uL  COMPREHENSIVE METABOLIC PANEL      Result Value Ref Range   Sodium 141   137 - 147 mEq/L   Potassium 4.1  3.7 - 5.3 mEq/L   Chloride 104  96 - 112 mEq/L   CO2 23  19 - 32 mEq/L   Glucose, Bld 102 (*) 70 - 99 mg/dL   BUN 27 (*) 6 - 23 mg/dL   Creatinine, Ser 1.03  0.50 - 1.35 mg/dL   Calcium 9.0  8.4 - 10.5 mg/dL   Total Protein 7.4  6.0 - 8.3 g/dL   Albumin 3.3 (*) 3.5 - 5.2 g/dL   AST 15  0 - 37 U/L   ALT 8  0 - 53 U/L   Alkaline Phosphatase 144 (*) 39 - 117 U/L   Total Bilirubin <0.2 (*) 0.3 - 1.2 mg/dL   GFR calc non Af Amer 66 (*) >90 mL/min   GFR calc Af Amer 77 (*) >90 mL/min  TROPONIN I      Result Value Ref Range   Troponin I <0.30  <0.30 ng/mL  I-STAT CHEM 8, ED      Result Value Ref Range   Sodium 141  137 - 147 mEq/L   Potassium 3.7  3.7 - 5.3 mEq/L   Chloride 106  96 - 112 mEq/L   BUN 26 (*) 6 - 23 mg/dL   Creatinine, Ser 0.90  0.50 - 1.35 mg/dL   Glucose, Bld 92  70 - 99 mg/dL   Calcium, Ion 1.18  1.13 - 1.30 mmol/L   TCO2 22  0 - 100 mmol/L   Hemoglobin 11.9 (*) 13.0 - 17.0 g/dL   HCT 35.0 (*) 39.0 - 52.0 %   Dg Chest 2 View  04/23/2013   CLINICAL DATA:  Chest pain, shortness of breath, cough, congestion. Smoker.  EXAM: CHEST  2 VIEW  COMPARISON:  03/23/2013.  FINDINGS: Borderline enlarged cardiac silhouette with a mild decrease in size. Decreased prominence of the pulmonary vasculature. The interstitial markings remain prominent and the lungs are hyperexpanded. No pleural fluid. Diffuse osteopenia. Old, healed left clavicle fracture.  IMPRESSION: 1. No acute abnormality. 2. Changes of COPD.   Electronically Signed   By: Enrique Sack M.D.   On: 04/23/2013 23:16    Medications  albuterol (PROVENTIL,VENTOLIN) solution continuous neb (10 mg/hr Nebulization Given 04/23/13 2337)  ipratropium (ATROVENT) nebulizer solution 0.5 mg (0.5 mg Nebulization Given 04/23/13 2337)  predniSONE (DELTASONE) tablet 60 mg (60 mg Oral Given 04/23/13 2334)     MDM   Bronchitis/COPD exacerbation  Patient with history of chronic COPD who continues to smoke  presents to the ED with "being cold" though he later also complains of shortness of breath.  Upon arrival here he was caught smoking in his room.  I have given him a breathing treatment here and started him on oral prednisone which I will continue.  Chest x-ray is normal and labs are at his baseline.  He is able to ambulate in the halls without desating (93%), so I do not feel like admission is warranted at this time.      Idalia Needle Joelyn Oms, PA-C 04/24/13 0045

## 2013-04-24 NOTE — ED Provider Notes (Signed)
Medical screening examination/treatment/procedure(s) were conducted as a shared visit with non-physician practitioner(s) and myself.  I personally evaluated the patient during the encounter.   EKG Interpretation None      Dale Mcfarland is a 80 y.o. male hx of COPD, homeless here with coughing and SOB. He is found smoking in his room in the ED. Diffuse wheezing, not hypoxic when ambulating. CXR showed COPD, labs at baseline. Given duoneb, prednisone, will d/c with same.    Wandra Arthurs, MD 04/24/13 [phone]

## 2013-04-24 NOTE — Discharge Instructions (Signed)
Chronic Obstructive Pulmonary Disease Exacerbation °Chronic obstructive pulmonary disease (COPD) is a common lung condition in which airflow from the lungs is limited. COPD is a general term that can be used to describe many different lung problems that limit airflow, including chronic bronchitis and emphysema. COPD exacerbations are episodes when breathing symptoms become much worse and require extra treatment. Without treatment, COPD exacerbations can be life threatening, and frequent COPD exacerbations can cause further damage to your lungs. °CAUSES  °· Respiratory infections.   °· Exposure to smoke.   °· Exposure to air pollution, chemical fumes, or dust. °Sometimes there is no apparent cause or trigger. °RISK FACTORS °· Smoking cigarettes. °· Older age. °· Frequent prior COPD exacerbations. °SIGNS AND SYMPTOMS  °· Increased coughing.   °· Increased thick spit (sputum) production.   °· Increased wheezing.   °· Increased shortness of breath.   °· Rapid breathing.   °· Chest tightness. °DIAGNOSIS  °Your medical history, a physical exam, and tests will help your health care provider make a diagnosis. Tests may include: °· A chest X-ray. °· Basic lab tests. °· Sputum testing. °· An arterial blood gas test. °TREATMENT  °Depending on the severity of your COPD exacerbation, you may need to be admitted to a hospital for treatment. Some of the treatments commonly used to treat COPD exacerbations are:  °· Antibiotic medicines.   °· Bronchodilators. These are drugs that expand the air passages. They may be given with an inhaler or nebulizer. Spacer devices may be needed to help improve drug delivery. °· Corticosteroid medicines. °· Supplemental oxygen therapy.   °HOME CARE INSTRUCTIONS  °· Do not smoke. Quitting smoking is very important to prevent COPD from getting worse and exacerbations from happening as often. °· Avoid exposure to all substances that irritate the airway, especially to tobacco smoke.   °· If prescribed,  take your antibiotics as directed. Finish them even if you start to feel better. °· Only take over-the-counter or prescription medicines as directed by your health care provider. It is important to use correct technique with inhaled medicines. °· Drink enough fluids to keep your urine clear or pale yellow (unless you have a medical condition that requires fluid restriction). °· Use a cool mist vaporizer. This makes it easier to clear your chest when you cough.   °· If you have a home nebulizer and oxygen, continue to use them as directed.   °· Maintain all necessary vaccinations to prevent infections.   °· Exercise regularly.   °· Eat a healthy diet.   °· Keep all follow-up appointments as directed by your health care provider. °SEEK IMMEDIATE MEDICAL CARE IF: °· You have worsening shortness of breath.   °· You have trouble talking.   °· You have severe chest pain. °· You have blood in your sputum.  °· You have a fever. °· You have weakness, vomit repeatedly, or faint.   °· You feel confused.   °· You continue to get worse. °MAKE SURE YOU:  °· Understand these instructions. °· Will watch your condition. °· Will get help right away if you are not doing well or get worse. °Document Released: 11/25/2006 Document Revised: 11/18/2012 Document Reviewed: 10/02/2012 °[employer]® Patient Information ©2014 [employer]. ° °

## 2013-04-29 ENCOUNTER — Emergency Department (HOSPITAL_COMMUNITY)
Admission: EM | Admit: 2013-04-29 | Discharge: 2013-04-30 | Disposition: A | Discharge status: Home / Self Care | Payer: Medicare Other | Attending: Emergency Medicine | Admitting: Emergency Medicine

## 2013-04-29 ENCOUNTER — Encounter (HOSPITAL_COMMUNITY): Payer: Self-pay | Admitting: Emergency Medicine

## 2013-04-29 DIAGNOSIS — G8929 Other chronic pain: Secondary | ICD-10-CM | POA: Insufficient documentation

## 2013-04-29 DIAGNOSIS — F17200 Nicotine dependence, unspecified, uncomplicated: Secondary | ICD-10-CM | POA: Insufficient documentation

## 2013-04-29 DIAGNOSIS — Z8659 Personal history of other mental and behavioral disorders: Secondary | ICD-10-CM | POA: Insufficient documentation

## 2013-04-29 DIAGNOSIS — I10 Essential (primary) hypertension: Secondary | ICD-10-CM | POA: Insufficient documentation

## 2013-04-29 DIAGNOSIS — Z880 Allergy status to penicillin: Secondary | ICD-10-CM | POA: Insufficient documentation

## 2013-04-29 DIAGNOSIS — J441 Chronic obstructive pulmonary disease with (acute) exacerbation: Secondary | ICD-10-CM | POA: Insufficient documentation

## 2013-04-29 DIAGNOSIS — R05 Cough: Secondary | ICD-10-CM

## 2013-04-29 DIAGNOSIS — Z5900 Homelessness unspecified: Secondary | ICD-10-CM | POA: Insufficient documentation

## 2013-04-29 DIAGNOSIS — E119 Type 2 diabetes mellitus without complications: Secondary | ICD-10-CM | POA: Insufficient documentation

## 2013-04-29 DIAGNOSIS — Z862 Personal history of diseases of the blood and blood-forming organs and certain disorders involving the immune mechanism: Secondary | ICD-10-CM | POA: Insufficient documentation

## 2013-04-29 DIAGNOSIS — Z8701 Personal history of pneumonia (recurrent): Secondary | ICD-10-CM | POA: Insufficient documentation

## 2013-04-29 DIAGNOSIS — Z792 Long term (current) use of antibiotics: Secondary | ICD-10-CM | POA: Insufficient documentation

## 2013-04-29 DIAGNOSIS — R059 Cough, unspecified: Secondary | ICD-10-CM

## 2013-04-29 DIAGNOSIS — Z8719 Personal history of other diseases of the digestive system: Secondary | ICD-10-CM | POA: Insufficient documentation

## 2013-04-29 DIAGNOSIS — IMO0002 Reserved for concepts with insufficient information to code with codable children: Secondary | ICD-10-CM | POA: Insufficient documentation

## 2013-04-29 MED ORDER — IPRATROPIUM-ALBUTEROL 0.5-2.5 (3) MG/3ML IN SOLN
3.0000 mL | Freq: Once | RESPIRATORY_TRACT | Status: AC
  Administered 2013-04-29: 3 mL via RESPIRATORY_TRACT
  Filled 2013-04-29: qty 3

## 2013-04-29 NOTE — ED Notes (Addendum)
Per EMS: Pt was discharged from [facility] this morning after being treated for two days. Pt states that he hasn't gotten his prescriptions filled because he doesn't have any money until the 3rd of the month.  Pt reports cough, which hasn't gotten any better and requested to be transported to [facility] for evaluation and treatment. Pt is homeless.

## 2013-04-29 NOTE — ED Provider Notes (Signed)
CSN: [id number]     Arrival date & time 04/29/13  2142 History  This chart was scribed for Noland Fordyce, [place] working with Arbie Cookey, MD by Roxan Diesel, ED Scribe. This patient was seen in room [place] and the patient's care was started at 10:56 PM.   Chief Complaint  Patient presents with  . Cough    The history is provided by the patient. No language interpreter was used.    HPI Comments: Efe Fazzino is a homeless 80 y.o. male with h/o COPD, chronic lower back pain, PNA, HTN, and bronchitis who presents to the ED with complaints of having "bronchitis."  Pt states he was released this morning after being hospitalized at [facility] for bronchitis.  However medical records show no evidence of pt being hospitalized within the past month.  ED note from 6 days ago (3/13) reveals pt came to the ED with the same complaint of "bronchitis."  At that visit he told the provider that his chronic bronchitis was being exacerbated by being outside in the cold, with associated cough productive of thick yellow sputum and wheezing.  He was diagnosed with a bronchitis/COPD exacerbation and determined to be stable for discharge after a breathing treatment and oral prednisone.  Pt states his symptoms have not improved since then and he thinks that he needs another breathing treatment.   Past Medical History  Diagnosis Date  . COPD (chronic obstructive pulmonary disease)   . Chronic back pain   . Pneumonia     01/2011 - CAP vs aspiration pneuonmia  . Depression   . PONV (postoperative nausea and vomiting)   . Hyponatremia     Previously felt secondary to SIADH  . Hypertension   . Upper GI bleed     01/2011 with EGD showing severe candida esophagitis and duodenal bulb erosion  . Anemia     In the setting of UGI bleed 01/2011 requiring blood transfusion  . Homelessness   . Dyspnea     Chronic, thought due to COPD. Echo 02/2010 with EF 30-35%, nuclear study negative, then repeat echo  03/2011 did not show systolic dysfxn, so unclear if truly HF  . Bronchitis   . Diabetes mellitus without complication   . Collar bone fracture     left    Past Surgical History  Procedure Laterality Date  . Tonsillectomy    . Vasectomy    . Appendectomy    . Tonsillectomy    . Colonoscopy    . Esophagogastroduodenoscopy    . Esophagogastroduodenoscopy  02/03/2011    Procedure: ESOPHAGOGASTRODUODENOSCOPY (EGD);  Surgeon: Juanita Craver, MD;  Location: [facility];  Service: Endoscopy;  Laterality: N/A;  . Esophagogastroduodenoscopy  02/03/2011    Procedure: ESOPHAGOGASTRODUODENOSCOPY (EGD);  Surgeon: Juanita Craver, MD;  Location: [facility];  Service: Endoscopy;  Laterality: N/A;  . Colonoscopy  01/30/2012    Procedure: COLONOSCOPY;  Surgeon: Ladene Artist, MD,FACG;  Location: [facility];  Service: Endoscopy;  Laterality: N/A;    Family History  Problem Relation Age of Onset  . Coronary artery disease Father     Starting in his 65's. Died of massive MI at age 80.  . Schizophrenia Brother   . Coronary artery disease Sister     MI at age 65  . Alzheimer's disease Mother     History  Substance Use Topics  . Smoking status: Current Some Day Smoker -- 1.00 packs/day for 60 years    Types: Cigarettes  . Smokeless tobacco:  Never Used     Comment: Since age 65  . Alcohol Use: Yes     Comment: 174 ml of scotch weekly     Review of Systems  All other systems reviewed and are negative.      Allergies  Benadryl and Penicillins  Home Medications   Current Outpatient Rx  Name  Route  Sig  Dispense  Refill  . azithromycin (ZITHROMAX) 250 MG tablet   Oral   Take 250-500 mg by mouth daily. Take 500mg  once then 250mg  for four days         . predniSONE (DELTASONE) 20 MG tablet   Oral   Take 3 tablets (60 mg total) by mouth daily with breakfast.   15 tablet   0    BP 138/68  Pulse 81  Temp(Src) 98.6 F (37 C) (Oral)  Resp 16  SpO2 96%  Physical Exam  Nursing  note and vitals reviewed. Constitutional: He is oriented to person, place, and time. He appears well-developed and well-nourished.  HENT:  Head: Normocephalic and atraumatic.  Eyes: EOM are normal.  Neck: Normal range of motion.  Cardiovascular: Normal rate, regular rhythm and normal heart sounds.   No murmur heard. Pulmonary/Chest: Effort normal. No respiratory distress. He has wheezes (mild expiratory wheeze in left upper lung field).  Musculoskeletal: Normal range of motion.  Neurological: He is alert and oriented to person, place, and time.  Skin: Skin is warm and dry.  Psychiatric: He has a normal mood and affect. His behavior is normal.    ED Course  Procedures (including critical care time)  DIAGNOSTIC STUDIES: Oxygen Saturation is 96% on room air, normal by my interpretation.    COORDINATION OF CARE: 11:20 PM-Discussed treatment plan which includes breathing treatment with pt at bedside and pt agreed to plan.     Labs Review Labs Reviewed  RESPIRATORY VIRUS PANEL    Imaging Review No results found.   EKG Interpretation None      MDM   Final diagnoses:  Cough    Pt is an 80yo male with hx of COPD. C/o cough, worse with recent weather change.  Was evaluated at [facility] for similar symptoms on 3/14. Pt is poor historian, initially stated EMS just "dropped him off" then stated he was here b/c cough and SOB.  Due to pt hx of COPD will give neb tx.   Vitals: unremarkable, lungs-mild left upper lung field wheeze. Pt may be discharged, advised to fill medications and f/u with PCP. Return precautions provided. Pt verbalized understanding and agreement with tx plan.   I personally performed the services described in this documentation, which was scribed in my presence. The recorded information has been reviewed and is accurate.   Noland Fordyce, PA-C 04/30/13 [phone]

## 2013-04-30 LAB — RESPIRATORY VIRUS PANEL
Adenovirus: NOT DETECTED
INFLUENZA A: NOT DETECTED
Influenza A H1: NOT DETECTED
Influenza A H3: NOT DETECTED
Influenza B: NOT DETECTED
METAPNEUMOVIRUS: NOT DETECTED
PARAINFLUENZA 2 A: NOT DETECTED
PARAINFLUENZA 3 A: NOT DETECTED
Parainfluenza 1: NOT DETECTED
Respiratory Syncytial Virus A: NOT DETECTED
Respiratory Syncytial Virus B: NOT DETECTED
Rhinovirus: DETECTED — AB

## 2013-04-30 NOTE — ED Notes (Addendum)
Per Night Shift RN, Pt was given a bus pass and reported that he "was going to wait in the lobby until it is more light outside."  Pt was given a bus pass.

## 2013-04-30 NOTE — ED Notes (Signed)
Pt states he is fine and wants a bus pass for the am

## 2013-04-30 NOTE — Discharge Instructions (Signed)
Cough, Adult  A cough is a reflex. It helps you clear your throat and airways. A cough can help heal your body. A cough can last 2 or 3 weeks (acute) or may last more than 8 weeks (chronic). Some common causes of a cough can include an infection, allergy, or a cold. HOME CARE  Only take medicine as told by your doctor.  If given, take your medicines (antibiotics) as told. Finish them even if you start to feel better.  Use a cold steam vaporizer or humidier in your home. This can help loosen thick spit (secretions).  Sleep so you are almost sitting up (semi-upright). Use pillows to do this. This helps reduce coughing.  Rest as needed.  Stop smoking if you smoke. GET HELP RIGHT AWAY IF:  You have yellowish-white fluid (pus) in your thick spit.  Your cough gets worse.  Your medicine does not reduce coughing, and you are losing sleep.  You cough up blood.  You have trouble breathing.  Your pain gets worse and medicine does not help.  You have a fever. MAKE SURE YOU:   Understand these instructions.  Will watch your condition.  Will get help right away if you are not doing well or get worse. Document Released: 10/11/2010 Document Revised: 04/22/2011 Document Reviewed: 10/11/2010 [facility] Patient Information 2014 [place], [place].  Emergency Department Resource Guide 1) Find a Doctor and Pay Out of Pocket Although you won't have to find out who is covered by your insurance plan, it is a good idea to ask around and get recommendations. You will then need to call the office and see if the doctor you have chosen will accept you as a new patient and what types of options they offer for patients who are self-pay. Some doctors offer discounts or will set up payment plans for their patients who do not have insurance, but you will need to ask so you aren't surprised when you get to your appointment.  2) Contact Your Local Health Department Not all health departments have doctors that  can see patients for sick visits, but many do, so it is worth a call to see if yours does. If you don't know where your local health department is, you can check in your phone book. The CDC also has a tool to help you locate your state's health department, and many state websites also have listings of all of their local health departments.  3) Find a Walk-in Clinic If your illness is not likely to be very severe or complicated, you may want to try a walk in clinic. These are popping up all over the country in pharmacies, drugstores, and shopping centers. They're usually staffed by nurse practitioners or physician assistants that have been trained to treat common illnesses and complaints. They're usually fairly quick and inexpensive. However, if you have serious medical issues or chronic medical problems, these are probably not your best option.  No Primary Care Doctor: - Call Health Connect at  [phone] - they can help you locate a primary care doctor that  accepts your insurance, provides certain services, etc. - Physician Referral Service- [phone]  Chronic Pain Problems: Organization         Address  Phone   Notes  [facility]  [phone] Patients need to be referred by their primary care doctor.   Medication Assistance: Organization         Address  Phone   Notes  [facility] Medication Assistance [facility]  [address] [place], [place] [address] [phone] --Must be a resident of [facility] -- Must have NO insurance coverage whatsoever (no Medicaid/ Medicare, etc.) -- The pt. MUST have a primary care doctor that directs their care regularly and follows them in the community   MedAssist  [phone]   [employer]  [phone]    Agencies that provide inexpensive medical care: Organization         Address                                                       Phone                                                                             Notes  [facility]  [phone]   [facility]    [phone]   [facility] [address] [place], [place] [address] [phone]   [facility] [address] [place] [place] [phone]   Planned Parenthood    [phone]   [facility]    [phone]   [facility]  [address], [place] Phone:  [phone], Fax:  [phone] Hours of Operation:  9 am - 6 pm, M-F.  Also accepts Medicaid/Medicare and self-pay.  [facility]  [address], [place] Phone: [phone], Fax: [phone]. Hours of Operation:  8:30 am - 5:30 pm, M-F.  Also accepts Medicaid and self-pay.  [facility] [place] [address] [place] Phone: [phone]   [facility] [address] Natasha Bence [place], [place] [phone], Ext. [phone] Mondays & Thursdays: 7-9 AM.  First 15 patients are seen on a first come, first serve basis.    Medicaid-accepting [facility] Providers:  Organization         Address                                                                       Phone                               Notes  [facility] [address], [place] [phone] Also accepts self-pay patients.  [facility] [address] Laurell Josephs [place], [place]  [phone]   [facility] [address], [place] [phone]   [facility] [address], [place] [phone]   Renaye Rakers [address], [place]   [phone] Only accepts [place] Access [place] patients after  they have their name applied to their card.   Self-Pay (no insurance) in [facility]:   Organization         Address                                                     Phone               Notes  Sickle Cell Patients,  [facility] [place] [phone]   [facility]gent [facility] [place] [phone]   [facility] [place]  [place], [place], [place] [phone]   [facility]/Dr. Osei-Bonsu  [address], [place] or [address], [place] [phone] Phone number for both [place] and [place] locations is the same.  [facility]gent Medical and [facility] [address] [place] [phone]   [facility] [address], [place] or [address] [phone]   [facility] [address], [place] [phone], phone; [phone], fax Sees patients 1st and 3rd Saturday of every month.  Must not qualify for public or private insurance (i.e. Medicaid, Medicare, [place] Health Choice, Veterans' Benefits)  Household income should be no more than 200% of the poverty level The clinic cannot treat you if you are pregnant or think you are pregnant  Sexually transmitted diseases are not treated at the clinic.    Dental Care: Organization         Address                                  Phone                       Notes  [facility] Department of [facility] [phone] Accepts children up to age 65 who are enrolled in [place] or [place]; pregnant women with a Medicaid card; and children who have applied for Medicaid or [place] Health Choice, but were declined, whose parents can pay a reduced fee at time of service.  [facility] Department of [facility]  [address], [place] [phone] Accepts children up to age 15 who are enrolled in [place] or [place]; pregnant women with a Medicaid card; and children who have applied for Medicaid or [place] Health Choice, but were declined, whose parents can pay a reduced fee at time of  service.  [facility] Adult [facility] Access PRO[facility]  [place] [phone] Patients are seen by appointment only. Walk-ins are not accepted. [place] will see patients 17 years of age and older. Monday - Tuesday (8am-5pm) Most Wednesdays (8:30-5pm) $30 per visit, cash only  [facility] Adult Dental Access PROGRAM  [address], [facility] [phone] Patients are seen by appointment only. Walk-ins are not accepted. [place] will see patients 21 years of age and older. One Wednesday Evening (Monthly: Volunteer Based).  $30 per visit, cash only  [place]  [phone] for adults; Children under age 53, call Graduate Pediatric  Dentistry at [phone]. Children aged 83-14, please call [phone] to request a pediatric application.  Dental services are provided in all areas of dental care including fillings, crowns and bridges, complete and partial dentures, implants, gum treatment, root canals, and extractions. Preventive care is also provided. Treatment is provided to both adults and children. Patients are selected via a lottery and there is often a waiting list.   [facility] [address] [place]  [phone] [email or web address]   [facility] [address] [place], [place] [phone], Ext. [phone] Second and Fourth Thursday of each month, opens at 6:30 AM; Clinic ends at 9 AM.  Patients are seen on a first-come first-served basis, and a limited number are seen during each clinic.   [facility]  [address] Hillard Danker [place], [place] [phone]   Eligibility Requirements You must have lived in [place], [place], or [place] counties for at least the last three months.   You cannot be eligible for state or federal sponsored [employer], including [employer], [place], or [employer].   You generally cannot be eligible for healthcare insurance through your employer.     How to apply: Eligibility screenings are held every Tuesday and Wednesday afternoon from 1:00 pm until 4:00 pm. You do not need an appointment for the interview!  [facility] [address], [place], [place]   [place]  [place] Department  [place]  [phone]    Behavioral Health Resources in the Community: Intensive Outpatient Programs Organization         Address                                              Phone              Notes  [facility] McCoy. [address], [place], [place] [phone]   [facility] Outpatient [address] [place], [place]   [facility]: [facility] [address] [place], [place]   [place] [address]  [place], [place] or [phone]   Substance Abuse Resources Organization         Address                                Phone  Notes  Alcohol and Drug Services  [phone]   [place]  [phone]   [facility] [place]   Chinita Pester  [phone]   Residential & Outpatient Substance Abuse Program  [phone]   Psychological Services Organization         Address                                  Phone                Notes  [facility] [place]  [place]  [phone]   [place] [address] [place] or [phone]    Mobile Crisis Teams Organization         Address  Phone  Notes  [facility], Mobile Crisis Care Unit  [phone]   [facility]  [address] [place], [place] [phone]   [facility] [address] [place] [phone]    Self-Help/Support Groups Organization         Address                         Phone             Notes  [facility]  [facility] [place] - variety of support groups  [phone]- [phone] Call for more information  Narcotics Anonymous (NA), Caring Services [address], [employer] [place]  2 meetings at this location   Statistician         Address                                                    Phone              Notes  [facility] [address],    [place]  [phone]   [facility]  [address] [place] [address], [place], [place] [phone]   [facility] [address] [place], [place] [phone] Admissions: 8am-3pm M-F  [facility] [address]    [place], [place] [phone]   [facility] [address] [place], [place], [place] [phone]   [facility] [place] [address],  [place], [place] [phone]   [facility] - Intensive Outpatient [address] Laurell Josephs [address], [place], [place] [phone]   [facility] (Addiction Recovery Care [facility]oc[facility] [address] [place].,  [place], [place] [phone] or [phone]   [facility] (RTS) [address], [place], [place] [phone] Accepts Medicaid  [facility] [address]  [place] [phone] Substance Abuse/Addiction Treatment   [facility] Organization         Address                                                            Phone                    Notes  [facility]  [phone]   Angie Fava, PhD [address] Ervin Knack [place], [place]   [phone] or [phone]   [facility] Behavioral   [address] [place], [place] [phone]   [facility] [address] [place], [place] [phone] Insurance/Medicaid/sponsorship through [facility] and Families [address]                                    [place], [place] [phone] Therapy/tele-psych/case  [facility] [address], [place] [phone]    Dr. Lolly Mustache  [phone]   [facility]  [employer] [facility] Dept. 1) [address], [place] 2) [address] [place]  3)  [address], [place] [phone]  [phone]   [facility] Child Abuse Hotline 346-412-0943 or [phone] (After Hours)

## 2013-04-30 NOTE — ED Provider Notes (Signed)
Medical screening examination/treatment/procedure(s) were conducted as a shared visit with non-physician practitioner(s) and myself.  I personally evaluated the patient during the encounter.   EKG Interpretation None      80 yo homeless male with chief complaint of "I don't want to be cold."  States he was sleeping in a parking lot when police told him he had to leave.  So he came to the ED.  Denies specific complaints except cough.  On exam, nontoxic, no respiratory distress, disheveled, sitting upright in chair, lungs clear to auscultation bilaterally, heart sounds normal, RRR.  Appears stable for discharge.    Clinical Impression: 1. Cough       Houston Siren III, MD 04/30/13 [phone]

## 2013-04-30 NOTE — ED Notes (Signed)
Pt resting , in no acute distress

## 2013-05-10 ENCOUNTER — Encounter (HOSPITAL_COMMUNITY): Payer: Self-pay | Admitting: Emergency Medicine

## 2013-05-10 ENCOUNTER — Emergency Department (HOSPITAL_COMMUNITY)
Admission: EM | Admit: 2013-05-10 | Discharge: 2013-05-11 | Disposition: A | Discharge status: Home / Self Care | Payer: Medicare Other | Attending: Emergency Medicine | Admitting: Emergency Medicine

## 2013-05-10 DIAGNOSIS — J449 Chronic obstructive pulmonary disease, unspecified: Secondary | ICD-10-CM | POA: Insufficient documentation

## 2013-05-10 DIAGNOSIS — M79609 Pain in unspecified limb: Secondary | ICD-10-CM | POA: Insufficient documentation

## 2013-05-10 DIAGNOSIS — G8929 Other chronic pain: Secondary | ICD-10-CM | POA: Insufficient documentation

## 2013-05-10 DIAGNOSIS — Z8701 Personal history of pneumonia (recurrent): Secondary | ICD-10-CM | POA: Insufficient documentation

## 2013-05-10 DIAGNOSIS — Z8659 Personal history of other mental and behavioral disorders: Secondary | ICD-10-CM | POA: Insufficient documentation

## 2013-05-10 DIAGNOSIS — I10 Essential (primary) hypertension: Secondary | ICD-10-CM | POA: Insufficient documentation

## 2013-05-10 DIAGNOSIS — Z5900 Homelessness unspecified: Secondary | ICD-10-CM | POA: Insufficient documentation

## 2013-05-10 DIAGNOSIS — E119 Type 2 diabetes mellitus without complications: Secondary | ICD-10-CM | POA: Insufficient documentation

## 2013-05-10 DIAGNOSIS — M79601 Pain in right arm: Secondary | ICD-10-CM

## 2013-05-10 DIAGNOSIS — Z8719 Personal history of other diseases of the digestive system: Secondary | ICD-10-CM | POA: Insufficient documentation

## 2013-05-10 DIAGNOSIS — J4489 Other specified chronic obstructive pulmonary disease: Secondary | ICD-10-CM | POA: Insufficient documentation

## 2013-05-10 DIAGNOSIS — F17200 Nicotine dependence, unspecified, uncomplicated: Secondary | ICD-10-CM | POA: Insufficient documentation

## 2013-05-10 DIAGNOSIS — Z9089 Acquired absence of other organs: Secondary | ICD-10-CM | POA: Insufficient documentation

## 2013-05-10 DIAGNOSIS — Z880 Allergy status to penicillin: Secondary | ICD-10-CM | POA: Insufficient documentation

## 2013-05-10 DIAGNOSIS — Z8781 Personal history of (healed) traumatic fracture: Secondary | ICD-10-CM | POA: Insufficient documentation

## 2013-05-10 NOTE — ED Notes (Signed)
Pt states that he is having R sided arm, hand and wrist pain. Does not remember injury but could have fallen. Neuro intact per EMS.

## 2013-05-11 MED ORDER — NAPROXEN 500 MG PO TABS: 500.0000 mg | ORAL_TABLET | Freq: Two times a day (BID) | ORAL | Status: DC

## 2013-05-11 MED ORDER — HYDROCODONE-ACETAMINOPHEN 5-325 MG PO TABS
2.0000 | ORAL_TABLET | Freq: Once | ORAL | Status: AC
  Administered 2013-05-11: 2 via ORAL
  Filled 2013-05-11: qty 2

## 2013-05-11 NOTE — Discharge Instructions (Signed)
Arthralgia  Your caregiver has diagnosed you as suffering from an arthralgia. Arthralgia means there is pain in a joint. This can come from many reasons including:  · Bruising the joint which causes soreness (inflammation) in the joint.  · Wear and tear on the joints which occur as we grow older (osteoarthritis).  · Overusing the joint.  · Various forms of arthritis.  · Infections of the joint.  Regardless of the cause of pain in your joint, most of these different pains respond to anti-inflammatory drugs and rest. The exception to this is when a joint is infected, and these cases are treated with antibiotics, if it is a bacterial infection.  HOME CARE INSTRUCTIONS   · Rest the injured area for as long as directed by your caregiver. Then slowly start using the joint as directed by your caregiver and as the pain allows. Crutches as directed may be useful if the ankles, knees or hips are involved. If the knee was splinted or casted, continue use and care as directed. If an stretchy or elastic wrapping bandage has been applied today, it should be removed and re-applied every 3 to 4 hours. It should not be applied tightly, but firmly enough to keep swelling down. Watch toes and feet for swelling, bluish discoloration, coldness, numbness or excessive pain. If any of these problems (symptoms) occur, remove the ace bandage and re-apply more loosely. If these symptoms persist, contact your caregiver or return to this location.  · For the first 24 hours, keep the injured extremity elevated on pillows while lying down.  · Apply ice for 15-20 minutes to the sore joint every couple hours while awake for the first half day. Then 03-04 times per day for the first 48 hours. Put the ice in a plastic bag and place a towel between the bag of ice and your skin.  · Wear any splinting, casting, elastic bandage applications, or slings as instructed.  · Only take over-the-counter or prescription medicines for pain, discomfort, or fever as  directed by your caregiver. Do not use aspirin immediately after the injury unless instructed by your physician. Aspirin can cause increased bleeding and bruising of the tissues.  · If you were given crutches, continue to use them as instructed and do not resume weight bearing on the sore joint until instructed.  Persistent pain and inability to use the sore joint as directed for more than 2 to 3 days are warning signs indicating that you should see a caregiver for a follow-up visit as soon as possible. Initially, a hairline fracture (break in bone) may not be evident on X-rays. Persistent pain and swelling indicate that further evaluation, non-weight bearing or use of the joint (use of crutches or slings as instructed), or further X-rays are indicated. X-rays may sometimes not show a small fracture until a week or 10 days later. Make a follow-up appointment with your own caregiver or one to whom we have referred you. A radiologist (specialist in reading X-rays) may read your X-rays. Make sure you know how you are to obtain your X-ray results. Do not assume everything is normal if you do not hear from us.  SEEK MEDICAL CARE IF:  Bruising, swelling, or pain increases.  SEEK IMMEDIATE MEDICAL CARE IF:   · Your fingers or toes are numb or blue.  · The pain is not responding to medications and continues to stay the same or get worse.  · The pain in your joint becomes severe.  · You   develop a fever over 102° F (38.9° C).  · It becomes impossible to move or use the joint.  MAKE SURE YOU:   · Understand these instructions.  · Will watch your condition.  · Will get help right away if you are not doing well or get worse.  Document Released: 01/28/2005 Document Revised: 04/22/2011 Document Reviewed: 09/16/2007  [employer]® Patient Information ©2014 [employer].

## 2013-05-11 NOTE — ED Provider Notes (Signed)
Medical screening examination/treatment/procedure(s) were conducted as a shared visit with non-physician practitioner(s) or resident  and myself.  I personally evaluated the patient during the encounter and agree with the findings and plan unless otherwise indicated.    I have personally reviewed any xrays and/ or EKG's with the provider and I agree with interpretation.   Patient with complaint of diffuse right arm pain. Denies history of trauma. No chest pain or shortness of breath. Patient has had this similar in the past. On exam patient has normal strength, sensation, distal pulses in the right upper extremity. No focal tenderness.  Compartments are soft no signs of infection. Patient is homeless and asking for pain medicines. Discussed outpatient followup and reasons to return.  Right arm pain  Mariea Clonts, MD 05/11/13 [phone]

## 2013-05-11 NOTE — ED Provider Notes (Signed)
CSN: [id number]     Arrival date & time 05/10/13  2233 History   First MD Initiated Contact with Patient 05/11/13 0004     Chief Complaint  Patient presents with  . Arm Pain     (Consider location/radiation/quality/duration/timing/severity/associated sxs/prior Treatment) HPI Comments: Patient is an 80 year old male with multiple visits to the emergency department and a history of COPD, chronic back pain, homelessness, and diabetes mellitus who presents to the emergency department today for right arm pain. Patient states that his arm started hurting this morning. He states it hurts all the way from his right shoulder to his right hand. Patient states the pain is worse with movement. He denies any alleviating factors. He does not recall any trauma or injury or overuse of his right upper extremity. Patient denies associated fever, numbness, pallor, or erythema.  Patient is a 80 y.o. male presenting with arm pain. The history is provided by the patient. No language interpreter was used.  Arm Pain Associated symptoms include arthralgias and myalgias.    Past Medical History  Diagnosis Date  . COPD (chronic obstructive pulmonary disease)   . Chronic back pain   . Pneumonia     01/2011 - CAP vs aspiration pneuonmia  . Depression   . PONV (postoperative nausea and vomiting)   . Hyponatremia     Previously felt secondary to SIADH  . Hypertension   . Upper GI bleed     01/2011 with EGD showing severe candida esophagitis and duodenal bulb erosion  . Anemia     In the setting of UGI bleed 01/2011 requiring blood transfusion  . Homelessness   . Dyspnea     Chronic, thought due to COPD. Echo 02/2010 with EF 30-35%, nuclear study negative, then repeat echo 03/2011 did not show systolic dysfxn, so unclear if truly HF  . Bronchitis   . Diabetes mellitus without complication   . Collar bone fracture     left   Past Surgical History  Procedure Laterality Date  . Tonsillectomy    . Vasectomy     . Appendectomy    . Tonsillectomy    . Colonoscopy    . Esophagogastroduodenoscopy    . Esophagogastroduodenoscopy  02/03/2011    Procedure: ESOPHAGOGASTRODUODENOSCOPY (EGD);  Surgeon: Juanita Craver, MD;  Location: [facility];  Service: Endoscopy;  Laterality: N/A;  . Esophagogastroduodenoscopy  02/03/2011    Procedure: ESOPHAGOGASTRODUODENOSCOPY (EGD);  Surgeon: Juanita Craver, MD;  Location: [facility];  Service: Endoscopy;  Laterality: N/A;  . Colonoscopy  01/30/2012    Procedure: COLONOSCOPY;  Surgeon: Ladene Artist, MD,FACG;  Location: [facility];  Service: Endoscopy;  Laterality: N/A;   Family History  Problem Relation Age of Onset  . Coronary artery disease Father     Starting in his 3's. Died of massive MI at age 64.  . Schizophrenia Brother   . Coronary artery disease Sister     MI at age 68  . Alzheimer's disease Mother    History  Substance Use Topics  . Smoking status: Current Some Day Smoker -- 1.00 packs/day for 60 years    Types: Cigarettes  . Smokeless tobacco: Never Used     Comment: Since age 69  . Alcohol Use: Yes     Comment: 174 ml of scotch weekly    Review of Systems  Musculoskeletal: Positive for arthralgias and myalgias.  All other systems reviewed and are negative.      Allergies  Benadryl and Penicillins  Home Medications  No  current outpatient prescriptions on file. BP 140/74  Pulse 84  Temp(Src) 98.1 F (36.7 C) (Oral)  Resp 18  SpO2 94%  Physical Exam  Nursing note and vitals reviewed. Constitutional: He is oriented to person, place, and time. He appears well-developed and well-nourished. No distress.  HENT:  Head: Normocephalic and atraumatic.  Eyes: Conjunctivae and EOM are normal. No scleral icterus.  Neck: Normal range of motion.  Cardiovascular: Normal rate, regular rhythm and intact distal pulses.   Distal radial pulse 2+ in right upper extremity. Capillary full normal in all digits of right hand.  Pulmonary/Chest:  Effort normal. No respiratory distress.  Musculoskeletal: Normal range of motion.  Patient resistant to exam. Will move RUE freely when distracted, but claims it "hurts too much to move" when asked to do this. No crepitus, deformities, swelling, erythema, heat to touch, or red linear streaking appreciated. RUE warm and dry.  Neurological: He is alert and oriented to person, place, and time.  No gross sensory deficits appreciated in right upper extremity; finger to thumb opposition intact.  Skin: Skin is warm and dry. No rash noted. He is not diaphoretic. No erythema. No pallor.  Psychiatric: He has a normal mood and affect. His behavior is normal.    ED Course  Procedures (including critical care time) Labs Review Labs Reviewed - No data to display  Imaging Review No results found.   EKG Interpretation None      MDM   Final diagnoses:  Right arm pain    Uncomplicated right arm pain. Patient neurovascularly intact. No sensory deficits appreciated. No deformities, crepitus, or evidence of underlying cellulitis/infection. No pallor, poikilothermia, pulselessness, or paresthesias on exam. No known trauma or injury inciting symptoms today. Patient well and nontoxic appearing, hemodynamically stable, and afebrile. Will d/c today with Rx for Naproxen. Return precautions provided and patient agreeable to plan with no unaddressed concerns.   Filed Vitals:   05/10/13 2311 05/10/13 2335  BP: 107/65 140/74  Pulse: 80 84  Temp: 98.5 F (36.9 C) 98.1 F (36.7 C)  TempSrc: Oral Oral  Resp: 18   SpO2: 96% 94%      Antonietta Breach, PA-C 05/11/13 [phone]

## 2013-06-15 ENCOUNTER — Inpatient Hospital Stay (HOSPITAL_COMMUNITY)
Admission: EM | Admit: 2013-06-15 | Discharge: 2013-06-19 | DRG: 193 | Disposition: A | Discharge status: Home / Self Care | Payer: Medicare Other | Attending: Internal Medicine | Admitting: Internal Medicine

## 2013-06-15 ENCOUNTER — Encounter (HOSPITAL_COMMUNITY): Payer: Self-pay | Admitting: Emergency Medicine

## 2013-06-15 ENCOUNTER — Emergency Department (HOSPITAL_COMMUNITY): Payer: Medicare Other

## 2013-06-15 DIAGNOSIS — D126 Benign neoplasm of colon, unspecified: Secondary | ICD-10-CM

## 2013-06-15 DIAGNOSIS — S2239XA Fracture of one rib, unspecified side, initial encounter for closed fracture: Secondary | ICD-10-CM

## 2013-06-15 DIAGNOSIS — S2249XA Multiple fractures of ribs, unspecified side, initial encounter for closed fracture: Secondary | ICD-10-CM

## 2013-06-15 DIAGNOSIS — I472 Ventricular tachycardia, unspecified: Secondary | ICD-10-CM | POA: Diagnosis present

## 2013-06-15 DIAGNOSIS — Z5900 Homelessness unspecified: Secondary | ICD-10-CM

## 2013-06-15 DIAGNOSIS — R9431 Abnormal electrocardiogram [ECG] [EKG]: Secondary | ICD-10-CM

## 2013-06-15 DIAGNOSIS — J189 Pneumonia, unspecified organism: Principal | ICD-10-CM | POA: Diagnosis present

## 2013-06-15 DIAGNOSIS — R195 Other fecal abnormalities: Secondary | ICD-10-CM

## 2013-06-15 DIAGNOSIS — I509 Heart failure, unspecified: Secondary | ICD-10-CM

## 2013-06-15 DIAGNOSIS — D509 Iron deficiency anemia, unspecified: Secondary | ICD-10-CM | POA: Diagnosis present

## 2013-06-15 DIAGNOSIS — F1010 Alcohol abuse, uncomplicated: Secondary | ICD-10-CM | POA: Diagnosis present

## 2013-06-15 DIAGNOSIS — Z8249 Family history of ischemic heart disease and other diseases of the circulatory system: Secondary | ICD-10-CM

## 2013-06-15 DIAGNOSIS — R55 Syncope and collapse: Secondary | ICD-10-CM

## 2013-06-15 DIAGNOSIS — Z720 Tobacco use: Secondary | ICD-10-CM

## 2013-06-15 DIAGNOSIS — K2289 Other specified disease of esophagus: Secondary | ICD-10-CM

## 2013-06-15 DIAGNOSIS — J09X2 Influenza due to identified novel influenza A virus with other respiratory manifestations: Secondary | ICD-10-CM

## 2013-06-15 DIAGNOSIS — E119 Type 2 diabetes mellitus without complications: Secondary | ICD-10-CM | POA: Diagnosis present

## 2013-06-15 DIAGNOSIS — J441 Chronic obstructive pulmonary disease with (acute) exacerbation: Secondary | ICD-10-CM | POA: Diagnosis present

## 2013-06-15 DIAGNOSIS — K2100 Gastro-esophageal reflux disease with esophagitis, without bleeding: Secondary | ICD-10-CM

## 2013-06-15 DIAGNOSIS — D649 Anemia, unspecified: Secondary | ICD-10-CM

## 2013-06-15 DIAGNOSIS — R0609 Other forms of dyspnea: Secondary | ICD-10-CM

## 2013-06-15 DIAGNOSIS — Z820 Family history of epilepsy and other diseases of the nervous system: Secondary | ICD-10-CM

## 2013-06-15 DIAGNOSIS — D62 Acute posthemorrhagic anemia: Secondary | ICD-10-CM

## 2013-06-15 DIAGNOSIS — K228 Other specified diseases of esophagus: Secondary | ICD-10-CM

## 2013-06-15 DIAGNOSIS — S42401A Unspecified fracture of lower end of right humerus, initial encounter for closed fracture: Secondary | ICD-10-CM

## 2013-06-15 DIAGNOSIS — I5031 Acute diastolic (congestive) heart failure: Secondary | ICD-10-CM | POA: Diagnosis not present

## 2013-06-15 DIAGNOSIS — J449 Chronic obstructive pulmonary disease, unspecified: Secondary | ICD-10-CM

## 2013-06-15 DIAGNOSIS — M549 Dorsalgia, unspecified: Secondary | ICD-10-CM | POA: Diagnosis present

## 2013-06-15 DIAGNOSIS — E860 Dehydration: Secondary | ICD-10-CM

## 2013-06-15 DIAGNOSIS — F1020 Alcohol dependence, uncomplicated: Secondary | ICD-10-CM

## 2013-06-15 DIAGNOSIS — K922 Gastrointestinal hemorrhage, unspecified: Secondary | ICD-10-CM

## 2013-06-15 DIAGNOSIS — F17200 Nicotine dependence, unspecified, uncomplicated: Secondary | ICD-10-CM | POA: Diagnosis present

## 2013-06-15 DIAGNOSIS — I4729 Other ventricular tachycardia: Secondary | ICD-10-CM | POA: Diagnosis present

## 2013-06-15 DIAGNOSIS — D72829 Elevated white blood cell count, unspecified: Secondary | ICD-10-CM

## 2013-06-15 DIAGNOSIS — G8929 Other chronic pain: Secondary | ICD-10-CM | POA: Diagnosis present

## 2013-06-15 DIAGNOSIS — R0602 Shortness of breath: Secondary | ICD-10-CM

## 2013-06-15 DIAGNOSIS — I4891 Unspecified atrial fibrillation: Secondary | ICD-10-CM

## 2013-06-15 LAB — CBC WITH DIFFERENTIAL/PLATELET
Basophils Absolute: 0 10*3/uL (ref 0.0–0.1)
Basophils Relative: 0 % (ref 0–1)
EOS ABS: 0.3 10*3/uL (ref 0.0–0.7)
EOS PCT: 4 % (ref 0–5)
HCT: 30.8 % — ABNORMAL LOW (ref 39.0–52.0)
HEMOGLOBIN: 9.6 g/dL — AB (ref 13.0–17.0)
LYMPHS ABS: 1.7 10*3/uL (ref 0.7–4.0)
Lymphocytes Relative: 22 % (ref 12–46)
MCH: 24.7 pg — AB (ref 26.0–34.0)
MCHC: 31.2 g/dL (ref 30.0–36.0)
MCV: 79.4 fL (ref 78.0–100.0)
MONOS PCT: 10 % (ref 3–12)
Monocytes Absolute: 0.8 10*3/uL (ref 0.1–1.0)
Neutro Abs: 4.9 10*3/uL (ref 1.7–7.7)
Neutrophils Relative %: 64 % (ref 43–77)
Platelets: 306 10*3/uL (ref 150–400)
RBC: 3.88 MIL/uL — AB (ref 4.22–5.81)
RDW: 18.3 % — ABNORMAL HIGH (ref 11.5–15.5)
WBC: 7.7 10*3/uL (ref 4.0–10.5)

## 2013-06-15 LAB — CBC
HEMATOCRIT: 31.4 % — AB (ref 39.0–52.0)
Hemoglobin: 9.7 g/dL — ABNORMAL LOW (ref 13.0–17.0)
MCH: 24.7 pg — ABNORMAL LOW (ref 26.0–34.0)
MCHC: 30.9 g/dL (ref 30.0–36.0)
MCV: 79.9 fL (ref 78.0–100.0)
Platelets: 315 10*3/uL (ref 150–400)
RBC: 3.93 MIL/uL — AB (ref 4.22–5.81)
RDW: 18.3 % — ABNORMAL HIGH (ref 11.5–15.5)
WBC: 7.5 10*3/uL (ref 4.0–10.5)

## 2013-06-15 LAB — CREATININE, SERUM
Creatinine, Ser: 1.09 mg/dL (ref 0.50–1.35)
GFR calc Af Amer: 71 mL/min — ABNORMAL LOW (ref >90)
GFR, EST NON AFRICAN AMERICAN: 61 mL/min — AB (ref >90)

## 2013-06-15 LAB — I-STAT CHEM 8, ED
BUN: 19 mg/dL (ref 6–23)
CALCIUM ION: 1.19 mmol/L (ref 1.13–1.30)
CREATININE: 0.9 mg/dL (ref 0.50–1.35)
Chloride: 107 mEq/L (ref 96–112)
Glucose, Bld: 71 mg/dL (ref 70–99)
HCT: 32 % — ABNORMAL LOW (ref 39.0–52.0)
Hemoglobin: 10.9 g/dL — ABNORMAL LOW (ref 13.0–17.0)
Potassium: 3.6 mEq/L — ABNORMAL LOW (ref 3.7–5.3)
Sodium: 143 mEq/L (ref 137–147)
TCO2: 20 mmol/L (ref 0–100)

## 2013-06-15 LAB — POC OCCULT BLOOD, ED: Fecal Occult Bld: NEGATIVE

## 2013-06-15 LAB — TROPONIN I: Troponin I: <0.3 ng/mL (ref <0.30)

## 2013-06-15 LAB — PRO B NATRIURETIC PEPTIDE: Pro B Natriuretic peptide (BNP): 1138 pg/mL — ABNORMAL HIGH (ref 0–450)

## 2013-06-15 LAB — GLUCOSE, CAPILLARY: Glucose-Capillary: 137 mg/dL — ABNORMAL HIGH (ref 70–99)

## 2013-06-15 MED ORDER — SODIUM CHLORIDE 0.9 % IJ SOLN
3.0000 mL | Freq: Two times a day (BID) | INTRAMUSCULAR | Status: DC
  Administered 2013-06-15 – 2013-06-17 (×2): 3 mL via INTRAVENOUS

## 2013-06-15 MED ORDER — ALBUTEROL SULFATE HFA 108 (90 BASE) MCG/ACT IN AERS: 2.0000 | INHALATION_SPRAY | RESPIRATORY_TRACT | Status: DC | PRN

## 2013-06-15 MED ORDER — AZITHROMYCIN 250 MG PO TABS
500.0000 mg | ORAL_TABLET | Freq: Once | ORAL | Status: AC
  Administered 2013-06-15: 500 mg via ORAL
  Filled 2013-06-15: qty 2

## 2013-06-15 MED ORDER — CEFTRIAXONE SODIUM 1 G IJ SOLR
1.0000 g | Freq: Once | INTRAMUSCULAR | Status: AC
  Administered 2013-06-15: 1 g via INTRAVENOUS
  Filled 2013-06-15: qty 10

## 2013-06-15 MED ORDER — ACETAMINOPHEN 650 MG RE SUPP: 650.0000 mg | Freq: Four times a day (QID) | RECTAL | Status: DC | PRN

## 2013-06-15 MED ORDER — HYDROCODONE-ACETAMINOPHEN 7.5-325 MG/15ML PO SOLN
10.0000 mL | Freq: Once | ORAL | Status: AC
  Administered 2013-06-15: 19:00:00 via ORAL
  Filled 2013-06-15: qty 15

## 2013-06-15 MED ORDER — SODIUM CHLORIDE 0.9 % IJ SOLN
3.0000 mL | INTRAMUSCULAR | Status: DC | PRN
  Administered 2013-06-16: 3 mL via INTRAVENOUS

## 2013-06-15 MED ORDER — ALBUTEROL SULFATE (2.5 MG/3ML) 0.083% IN NEBU: 2.5000 mg | INHALATION_SOLUTION | Freq: Four times a day (QID) | RESPIRATORY_TRACT | Status: DC

## 2013-06-15 MED ORDER — AZITHROMYCIN 500 MG PO TABS
500.0000 mg | ORAL_TABLET | ORAL | Status: DC
  Administered 2013-06-16 – 2013-06-18 (×3): 500 mg via ORAL
  Filled 2013-06-15 (×5): qty 1

## 2013-06-15 MED ORDER — ALBUTEROL SULFATE (2.5 MG/3ML) 0.083% IN NEBU: 2.5000 mg | INHALATION_SOLUTION | RESPIRATORY_TRACT | Status: DC | PRN

## 2013-06-15 MED ORDER — SODIUM CHLORIDE 0.9 % IV SOLN
250.0000 mL | INTRAVENOUS | Status: DC | PRN
Start: 2013-06-15 — End: 2013-06-19

## 2013-06-15 MED ORDER — IPRATROPIUM BROMIDE 0.02 % IN SOLN: 0.5000 mg | Freq: Four times a day (QID) | RESPIRATORY_TRACT | Status: DC

## 2013-06-15 MED ORDER — ENOXAPARIN SODIUM 40 MG/0.4ML ~~LOC~~ SOLN
40.0000 mg | SUBCUTANEOUS | Status: DC
  Administered 2013-06-15 – 2013-06-17 (×3): 40 mg via SUBCUTANEOUS
  Filled 2013-06-15 (×4): qty 0.4

## 2013-06-15 MED ORDER — FUROSEMIDE 10 MG/ML IJ SOLN
20.0000 mg | Freq: Two times a day (BID) | INTRAMUSCULAR | Status: DC
  Administered 2013-06-16 (×2): 20 mg via INTRAVENOUS
  Filled 2013-06-15 (×5): qty 2

## 2013-06-15 MED ORDER — FUROSEMIDE 10 MG/ML IJ SOLN
40.0000 mg | Freq: Once | INTRAMUSCULAR | Status: AC
  Administered 2013-06-15: 40 mg via INTRAVENOUS
  Filled 2013-06-15: qty 4

## 2013-06-15 MED ORDER — ONDANSETRON HCL 4 MG PO TABS: 4.0000 mg | ORAL_TABLET | Freq: Four times a day (QID) | ORAL | Status: DC | PRN

## 2013-06-15 MED ORDER — DEXTROSE 5 % IV SOLN
1.0000 g | INTRAVENOUS | Status: DC
  Administered 2013-06-16 – 2013-06-18 (×3): 1 g via INTRAVENOUS
  Filled 2013-06-15 (×3): qty 10

## 2013-06-15 MED ORDER — ACETAMINOPHEN 325 MG PO TABS: 650.0000 mg | ORAL_TABLET | Freq: Four times a day (QID) | ORAL | Status: DC | PRN

## 2013-06-15 MED ORDER — IPRATROPIUM BROMIDE 0.02 % IN SOLN
0.5000 mg | Freq: Once | RESPIRATORY_TRACT | Status: AC
  Administered 2013-06-15: 0.5 mg via RESPIRATORY_TRACT
  Filled 2013-06-15: qty 2.5

## 2013-06-15 MED ORDER — SODIUM CHLORIDE 0.9 % IJ SOLN
3.0000 mL | Freq: Two times a day (BID) | INTRAMUSCULAR | Status: DC
  Administered 2013-06-16 – 2013-06-18 (×5): 3 mL via INTRAVENOUS

## 2013-06-15 MED ORDER — ONDANSETRON HCL 4 MG/2ML IJ SOLN: 4.0000 mg | Freq: Four times a day (QID) | INTRAMUSCULAR | Status: DC | PRN

## 2013-06-15 MED ORDER — HYDROCODONE-ACETAMINOPHEN 5-325 MG PO TABS
1.0000 | ORAL_TABLET | ORAL | Status: DC | PRN
  Administered 2013-06-16: 1 via ORAL
  Filled 2013-06-15: qty 1

## 2013-06-15 MED ORDER — IPRATROPIUM-ALBUTEROL 0.5-2.5 (3) MG/3ML IN SOLN
3.0000 mL | Freq: Three times a day (TID) | RESPIRATORY_TRACT | Status: DC
  Administered 2013-06-16 – 2013-06-19 (×10): 3 mL via RESPIRATORY_TRACT
  Filled 2013-06-15 (×10): qty 3

## 2013-06-15 MED ORDER — PANTOPRAZOLE SODIUM 20 MG PO TBEC
20.0000 mg | DELAYED_RELEASE_TABLET | Freq: Every day | ORAL | Status: DC
  Administered 2013-06-15 – 2013-06-19 (×5): 20 mg via ORAL
  Filled 2013-06-15 (×5): qty 1

## 2013-06-15 NOTE — ED Notes (Signed)
Pt states that he has generalized muscular pain on his arms, back and legs. Denies CP or abdominal pain. Alert and oriented. ETOH.

## 2013-06-15 NOTE — ED Notes (Signed)
Patient with body aches, chills, and cough.  Pt reports he is homeless by choice.  Patient reports he is very weak.

## 2013-06-15 NOTE — H&P (Addendum)
[facility]                                                                                    Patient Demographics  Dale Mcfarland, is a 80 y.o. male  CSN: [id number]  MRN: [id number]  DOB - [birth year]  Admit Date - 06/15/2013  Outpatient Primary MD for the patient is No PCP Per Patient   With History of -  Past Medical History  Diagnosis Date  . COPD (chronic obstructive pulmonary disease)   . Chronic back pain   . Pneumonia     01/2011 - CAP vs aspiration pneuonmia  . Depression   . PONV (postoperative nausea and vomiting)   . Hyponatremia     Previously felt secondary to SIADH  . Hypertension   . Upper GI bleed     01/2011 with EGD showing severe candida esophagitis and duodenal bulb erosion  . Anemia     In the setting of UGI bleed 01/2011 requiring blood transfusion  . Homelessness   . Dyspnea     Chronic, thought due to COPD. Echo 02/2010 with EF 30-35%, nuclear study negative, then repeat echo 03/2011 did not show systolic dysfxn, so unclear if truly HF  . Bronchitis   . Diabetes mellitus without complication   . Collar bone fracture     left      Past Surgical History  Procedure Laterality Date  . Tonsillectomy    . Vasectomy    . Appendectomy    . Tonsillectomy    . Colonoscopy    . Esophagogastroduodenoscopy    . Esophagogastroduodenoscopy  02/03/2011    Procedure: ESOPHAGOGASTRODUODENOSCOPY (EGD);  Surgeon: Juanita Craver, MD;  Location: [facility];  Service: Endoscopy;  Laterality: N/A;  . Esophagogastroduodenoscopy  02/03/2011    Procedure: ESOPHAGOGASTRODUODENOSCOPY (EGD);  Surgeon: Juanita Craver, MD;  Location: [facility];  Service: Endoscopy;  Laterality: N/A;  . Colonoscopy  01/30/2012    Procedure: COLONOSCOPY;  Surgeon: Ladene Artist, MD,FACG;  Location: [facility];  Service: Endoscopy;  Laterality: N/A;    in for   Chief Complaint  Patient presents with  . Cough     HPI  Dale Mcfarland  is a 80 y.o. male, homeless with  clinical history significant for COPD presenting with increased weakness and dyspnea on exertion over the last few weeks. Reports chest pain episodes that last for 30 seconds, stabbing no nausea vomiting or cold sweats and not related to exercise. Patient reports that he could walk 10 miles a day and now he can't walk only for 50 feet and then he has to sit down because of shortness of breath. No fever or chills, no reports of leg swelling lately. He reports cough with whitish sputum.    Review of Systems    In addition to the HPI above,  No Fever-chills, No Headache, No changes with Vision or hearing, No problems swallowing food or Liquids, No Abdominal pain, No Nausea or Vommitting, Bowel movements are regular, No Blood in stool or Urine, No dysuria, No new skin rashes or bruises, No new joints pains-aches,  No new weakness, tingling, numbness in any extremity, No recent weight gain  or loss, No polyuria, polydypsia or polyphagia, No significant Mental Stressors.  A full 10 point Review of Systems was done, except as stated above, all other Review of Systems were negative.   Social History History  Substance Use Topics  . Smoking status: Current Some Day Smoker -- 1.00 packs/day for 60 years    Types: Cigarettes  . Smokeless tobacco: Never Used     Comment: Since age 9  . Alcohol Use: Yes     Comment: 174 ml of scotch weekly     Family History Family History  Problem Relation Age of Onset  . Coronary artery disease Father     Starting in his 9's. Died of massive MI at age 57.  . Schizophrenia Brother   . Coronary artery disease Sister     MI at age 72  . Alzheimer's disease Mother      Prior to Admission medications   Not on File    Allergies  Allergen Reactions  . Benadryl [Diphenhydramine Hcl] Other (See Comments)    Upset stomach   . Penicillins Hives and Itching    Physical Exam  Vitals  Blood pressure 153/81, pulse 82, temperature 98.4 F (36.9  C), temperature source Oral, resp. rate 17, SpO2 100.00%.   1. General elderly white male, disshuffled, in moderate respiratory distress  2. , Not Suicidal or Homicidal, Awake Alert, Oriented X 3.  3. No F.N deficits, ALL C.Nerves Intact, Strength 5/5 all 4 extremities, Sensation intact all 4 extremities, Plantars down going.  4. Ears and Eyes appear Normal, Conjunctivae clear, PERRLA. Moist Oral Mucosa.  5. Supple Neck, No JVD, No cervical lymphadenopathy appriciated, No Carotid Bruits.  6. Symmetrical Chest wall movement, scattered rhonchi and wheezing bilaterally.  7. RRR, No Gallops, Rubs or Murmurs, No Parasternal Heave.  8. Positive Bowel Sounds, Abdomen Soft, Non tender, No organomegaly appriciated,No rebound -guarding or rigidity.  9.  No Cyanosis, Normal Skin Turgor, No Skin Rash or Bruise.  10. Good muscle tone,  joints appear normal , no effusions, Normal ROM.  11. No Palpable Lymph Nodes in Neck or Axillae    Data Review  CBC  Recent Labs Lab 06/15/13 1910 06/15/13 1919  WBC 7.7  --   HGB 9.6* 10.9*  HCT 30.8* 32.0*  PLT 306  --   MCV 79.4  --   MCH 24.7*  --   MCHC 31.2  --   RDW 18.3*  --   LYMPHSABS 1.7  --   MONOABS 0.8  --   EOSABS 0.3  --   BASOSABS 0.0  --    ------------------------------------------------------------------------------------------------------------------  Chemistries   Recent Labs Lab 06/15/13 1919  NA 143  K 3.6*  CL 107  GLUCOSE 71  BUN 19  CREATININE 0.90   ------------------------------------------------------------------------------------------------------------------ CrCl is unknown because both a height and weight (above a minimum accepted value) are required for this calculation. ------------------------------------------------------------------------------------------------------------------ No results found for this basename: TSH, T4TOTAL, FREET3, T3FREE, THYROIDAB,  in the last 72 hours   Coagulation  profile No results found for this basename: INR, PROTIME,  in the last 168 hours ------------------------------------------------------------------------------------------------------------------- No results found for this basename: DDIMER,  in the last 72 hours -------------------------------------------------------------------------------------------------------------------  Cardiac Enzymes No results found for this basename: CK, CKMB, TROPONINI, MYOGLOBIN,  in the last 168 hours ------------------------------------------------------------------------------------------------------------------ No components found with this basename: POCBNP,    ---------------------------------------------------------------------------------------------------------------    Imaging results:   Dg Chest 2 View  06/15/2013   CLINICAL DATA:  Productive cough  EXAM: CHEST  2 VIEW  COMPARISON:  DG CHEST 2 VIEW dated 04/23/2013  FINDINGS: There are bilateral interstitial and alveolar airspace opacities which are slightly more prominent compared with the prior exam of 04/23/2013. There is no pleural effusion or pneumothorax. Stable cardiomediastinal silhouette. There is thoracic aortic atherosclerosis.  The osseous structures are unremarkable.  IMPRESSION: Bilateral interstitial and alveolar airspace opacities which may reflect mild pulmonary edema versus infection.   Electronically Signed   By: Kathreen Devoid   On: 06/15/2013 18:47    My personal review of EKG:  Assessment & Plan  1. bilateral basal pneumonia; community-acquired    Start Rocephin and Zithromax    Sputum culture pending 2. Pulmonary edema with mildly elevated BNP    Ejection fraction normal in January 2015 with mild LVH    Lasix IV for one day and then reevaluate    Fluid restriction 3. Chest pain continuous for 30 seconds at a time,     Serial Troponins 4. Anemia with history of positive blood in stools    Check iron levels and TIBC and  ferritin    Repeat stool occult blood 5. COPD     Nebulizer treatment 6. Patient is homeless     Social work consult 7. History of active smoking      Advised to quit   DVT Prophylaxis Lovenox  AM Labs Ordered, also please review Full Orders  Family Communication: Not identified  Code Status full  Disposition Plan: Home or nursing home  Time spent in minutes : 38 minutes  Condition GUARDED   @SIGNATURE @

## 2013-06-15 NOTE — ED Notes (Signed)
Security has pts one pocket knife in their custody

## 2013-06-15 NOTE — ED Provider Notes (Signed)
CSN: [id number]     Arrival date & time 06/15/13  1734 History   First MD Initiated Contact with Patient 06/15/13 1755     No chief complaint on file.    (Consider location/radiation/quality/duration/timing/severity/associated sxs/prior Treatment) HPI  80 year old homeless male with history of diabetes, alcohol abuse, upper GI bleed, chronic back pain, and COPD who presents complaining of generalized body aches. Patient reports for the past week he has been having generalized body aches states he is hurting from head to toes. He also endorsed increased productive cough with green phlegm. Endorse having some chills. No specific treatment tried. States he feels weak. Denies headache, runny nose, sneezing, sore throat, chest pain, abdominal pain, nausea vomiting diarrhea, dysuria, or rash. Patient states he lives on the [address] by choice. Patient does not have a rescue inhaler. Denies any prior history of ICU stay or intubation secondary to COPD.  Past Medical History  Diagnosis Date  . COPD (chronic obstructive pulmonary disease)   . Chronic back pain   . Pneumonia     01/2011 - CAP vs aspiration pneuonmia  . Depression   . PONV (postoperative nausea and vomiting)   . Hyponatremia     Previously felt secondary to SIADH  . Hypertension   . Upper GI bleed     01/2011 with EGD showing severe candida esophagitis and duodenal bulb erosion  . Anemia     In the setting of UGI bleed 01/2011 requiring blood transfusion  . Homelessness   . Dyspnea     Chronic, thought due to COPD. Echo 02/2010 with EF 30-35%, nuclear study negative, then repeat echo 03/2011 did not show systolic dysfxn, so unclear if truly HF  . Bronchitis   . Diabetes mellitus without complication   . Collar bone fracture     left   Past Surgical History  Procedure Laterality Date  . Tonsillectomy    . Vasectomy    . Appendectomy    . Tonsillectomy    . Colonoscopy    . Esophagogastroduodenoscopy    .  Esophagogastroduodenoscopy  02/03/2011    Procedure: ESOPHAGOGASTRODUODENOSCOPY (EGD);  Surgeon: Juanita Craver, MD;  Location: [facility];  Service: Endoscopy;  Laterality: N/A;  . Esophagogastroduodenoscopy  02/03/2011    Procedure: ESOPHAGOGASTRODUODENOSCOPY (EGD);  Surgeon: Juanita Craver, MD;  Location: [facility];  Service: Endoscopy;  Laterality: N/A;  . Colonoscopy  01/30/2012    Procedure: COLONOSCOPY;  Surgeon: Ladene Artist, MD,FACG;  Location: [facility];  Service: Endoscopy;  Laterality: N/A;   Family History  Problem Relation Age of Onset  . Coronary artery disease Father     Starting in his 3's. Died of massive MI at age 11.  . Schizophrenia Brother   . Coronary artery disease Sister     MI at age 105  . Alzheimer's disease Mother    History  Substance Use Topics  . Smoking status: Current Some Day Smoker -- 1.00 packs/day for 60 years    Types: Cigarettes  . Smokeless tobacco: Never Used     Comment: Since age 47  . Alcohol Use: Yes     Comment: 174 ml of scotch weekly    Review of Systems  All other systems reviewed and are negative.     Allergies  Benadryl and Penicillins  Home Medications   Prior to Admission medications   Medication Sig Start Date End Date Taking? Authorizing Provider  naproxen (NAPROSYN) 500 MG tablet Take 1 tablet (500 mg total) by mouth 2 (two) times  daily. 05/11/13   Antonietta Breach, PA-C   BP 153/81  Pulse 82  Temp(Src) 98.4 F (36.9 C) (Oral)  Resp 17  SpO2 100% Physical Exam  Constitutional: He is oriented to person, place, and time. He appears well-developed and well-nourished. No distress.  Patient appears unkept however in no acute distress.  HENT:  Head: Atraumatic.  Mouth/Throat: Oropharynx is clear and moist.  Eyes: Conjunctivae are normal.  Neck: Normal range of motion. Neck supple. No JVD present.  Cardiovascular: Normal rate and regular rhythm.   Pulmonary/Chest:  Decreased breath sounds with a story wheezes,  mild rales and rhonchi heard throughout  Abdominal: Soft. There is no tenderness.  Musculoskeletal: He exhibits no edema and no tenderness (No focal point tenderness throughout all 4 extremities).  Lymphadenopathy:    He has no cervical adenopathy.  Neurological: He is alert and oriented to person, place, and time.  Skin: No rash noted.  Psychiatric: He has a normal mood and affect.    ED Course  Procedures (including critical care time)  6:17 PM Patient here with increased productive cough, occasional wheezing, and myalgias suggestive of either upper respiratory infection or pneumonia. Chest x-ray ordered. Albuterol/Atrovent nebulizer given.  Hycet given for cough and pain.    7:03 PM CXR demonstrates bilatearl interstitial and alveolar airspace opacities which may reflect mild pulmonary edema vs infection.  Given increasing productive cough, myalgias and chills i initiated zithromax abx.  Will check CBC, istat, BNP.  Care discussed with Dr. Roderic Palau.   7:43 PM Normal WBC.  Hgb 9.6, it was 11.9 one month ago.  Has hx of upper GI bleed.  Will check hemoccult.    8:34 PM Patient is afebrile, satting at 100% on room air. Hemoccult is negative. Pro BNP is 1138, however patient has higher BNP value in January. He has no JVD. He does not take any  diuretic medication.  Dr. Roderic Palau has evaluated pt and felt he will benefit from admit obs for COPD exacerbation in the setting of pna.    8:59 PM I have consulted with [facility], Dr. Laren Everts, who agrees to admit pt to obs, tele, team 8.  Recommend Lasix 40mg  IV, along with rocephin 1g IV.  Labs Review Labs Reviewed  CBC WITH DIFFERENTIAL - Abnormal; Notable for the following:    RBC 3.88 (*)    Hemoglobin 9.6 (*)    HCT 30.8 (*)    MCH 24.7 (*)    RDW 18.3 (*)    All other components within normal limits  PRO B NATRIURETIC PEPTIDE - Abnormal; Notable for the following:    Pro B Natriuretic peptide (BNP) 1138.0 (*)    All other  components within normal limits  I-STAT CHEM 8, ED - Abnormal; Notable for the following:    Potassium 3.6 (*)    Hemoglobin 10.9 (*)    HCT 32.0 (*)    All other components within normal limits  POC OCCULT BLOOD, ED    Imaging Review Dg Chest 2 View  06/15/2013   CLINICAL DATA:  Productive cough  EXAM: CHEST  2 VIEW  COMPARISON:  DG CHEST 2 VIEW dated 04/23/2013  FINDINGS: There are bilateral interstitial and alveolar airspace opacities which are slightly more prominent compared with the prior exam of 04/23/2013. There is no pleural effusion or pneumothorax. Stable cardiomediastinal silhouette. There is thoracic aortic atherosclerosis.  The osseous structures are unremarkable.  IMPRESSION: Bilateral interstitial and alveolar airspace opacities which may reflect mild pulmonary edema versus infection.  Electronically Signed   By: Kathreen Devoid   On: 06/15/2013 18:47     EKG Interpretation None      MDM   Final diagnoses:  COPD exacerbation  CAP (community acquired pneumonia)  Anemia    BP 153/81  Pulse 82  Temp(Src) 98.4 F (36.9 C) (Oral)  Resp 17  SpO2 100%  I have reviewed nursing notes and vital signs. I personally reviewed the imaging tests through PACS system  I reviewed available ER/hospitalization records thought the EMR     Domenic Moras, [place] 06/15/13 2100

## 2013-06-15 NOTE — ED Notes (Signed)
Pt is mildly agitated with staff because he said he waited too long for warm blankets.

## 2013-06-15 NOTE — ED Provider Notes (Signed)
Medical screening examination/treatment/procedure(s) were conducted as a shared visit with non-physician practitioner(s) and myself.  I personally evaluated the patient during the encounter.   EKG Interpretation None      Pr with cough.  pe lungs mild wheezs  Maudry Diego, MD 06/15/13 2235

## 2013-06-15 NOTE — ED Notes (Signed)
Pt provided urine sample, sample is in Pt's room on sink.

## 2013-06-16 DIAGNOSIS — D649 Anemia, unspecified: Secondary | ICD-10-CM

## 2013-06-16 DIAGNOSIS — J441 Chronic obstructive pulmonary disease with (acute) exacerbation: Secondary | ICD-10-CM

## 2013-06-16 DIAGNOSIS — F1020 Alcohol dependence, uncomplicated: Secondary | ICD-10-CM

## 2013-06-16 LAB — BASIC METABOLIC PANEL
BUN: 24 mg/dL — AB (ref 6–23)
CO2: 25 mEq/L (ref 19–32)
Calcium: 8.6 mg/dL (ref 8.4–10.5)
Chloride: 102 mEq/L (ref 96–112)
Creatinine, Ser: 1.01 mg/dL (ref 0.50–1.35)
GFR calc Af Amer: 78 mL/min — ABNORMAL LOW (ref >90)
GFR calc non Af Amer: 67 mL/min — ABNORMAL LOW (ref >90)
Glucose, Bld: 80 mg/dL (ref 70–99)
Potassium: 3.6 mEq/L — ABNORMAL LOW (ref 3.7–5.3)
Sodium: 141 mEq/L (ref 137–147)

## 2013-06-16 LAB — EXPECTORATED SPUTUM ASSESSMENT W GRAM STAIN, RFLX TO RESP C

## 2013-06-16 LAB — IRON AND TIBC
Iron: 26 ug/dL — ABNORMAL LOW (ref 42–135)
SATURATION RATIOS: 7 % — AB (ref 20–55)
TIBC: 391 ug/dL (ref 215–435)
UIBC: 365 ug/dL (ref 125–400)

## 2013-06-16 LAB — FERRITIN: Ferritin: 9 ng/mL — ABNORMAL LOW (ref 22–322)

## 2013-06-16 LAB — TROPONIN I
Troponin I: <0.3 ng/mL (ref <0.30)
Troponin I: <0.3 ng/mL (ref <0.30)

## 2013-06-16 LAB — EXPECTORATED SPUTUM ASSESSMENT W REFEX TO RESP CULTURE

## 2013-06-16 LAB — STREP PNEUMONIAE URINARY ANTIGEN: STREP PNEUMO URINARY ANTIGEN: NEGATIVE

## 2013-06-16 LAB — TSH: TSH: 2.68 u[IU]/mL (ref 0.350–4.500)

## 2013-06-16 MED ORDER — POTASSIUM CHLORIDE CRYS ER 20 MEQ PO TBCR
20.0000 meq | EXTENDED_RELEASE_TABLET | Freq: Once | ORAL | Status: AC
  Administered 2013-06-16: 20 meq via ORAL
  Filled 2013-06-16: qty 1

## 2013-06-16 MED ORDER — NICOTINE 21 MG/24HR TD PT24
21.0000 mg | MEDICATED_PATCH | Freq: Every day | TRANSDERMAL | Status: DC
  Administered 2013-06-16 – 2013-06-18 (×3): 21 mg via TRANSDERMAL
  Filled 2013-06-16 (×4): qty 1

## 2013-06-16 NOTE — Progress Notes (Addendum)
Nutrition Brief Note  Patient identified on the Malnutrition Screening Tool (MST) Report  Wt Readings from Last 15 Encounters:  06/15/13 140 lb 8 oz (63.73 kg)  03/03/13 139 lb 6.4 oz (63.231 kg)  02/19/13 132 lb (59.875 kg)  01/07/13 132 lb 11.5 oz (60.2 kg)  12/03/12 131 lb 14.4 oz (59.829 kg)  11/26/12 132 lb (59.875 kg)  11/23/12 141 lb 1.6 oz (64.003 kg)  11/17/12 132 lb (59.875 kg)  01/28/12 126 lb (57.153 kg)  01/28/12 126 lb (57.153 kg)  12/25/11 133 lb (60.328 kg)  05/05/11 136 lb 4.8 oz (61.825 kg)  04/17/11 135 lb 11.2 oz (61.553 kg)  03/31/11 110 lb (49.896 kg)  03/29/11 132 lb 11.5 oz (60.2 kg)    Body mass index is 22 kg/(m^2). Patient meets criteria for Normal weight based on current BMI.   Current diet order is Carb Mod/heart healthy, patient is consuming approximately >75% of meals at this time. Labs and medications reviewed.   Patient reported an intentional weight loss of 20 lbs over a 3 year period as he was concerned about health risks associated with obesity/overweight. Pt is homeless; however utilizes local resources ([employer], food pantries) to consume two free/reduced cost meals daily. Social work consult for additional assistance with resources upon d/c. No questions or concerns regarding nutrition.  No nutrition interventions warranted at this time. If nutrition issues arise, please consult RD.   Atlee Abide MS RD LDN Clinical Dietitian [phone]

## 2013-06-16 NOTE — Care Management Note (Addendum)
    Page 1 of 2   06/17/2013     3:15:24 PM CARE MANAGEMENT NOTE 06/17/2013  Patient:  Dale Mcfarland, Dale Mcfarland   Account Number:  [id number]  Date Initiated:  06/16/2013  Documentation initiated by:  Dessa Phi  Subjective/Objective Assessment:   80 Y/O M ADMITTED W/PNA.XF:GHWE.     Action/Plan:   HOMELESS-SHELTER.   Anticipated DC Date:  06/21/2013   Anticipated DC Plan:  HOMELESS [place]  CM consult  Patient refused services      Choice offered to / List presented to:             Status of service:  In process, will continue to follow Medicare Important Message given?   (If response is "NO", the following Medicare IM given date fields will be blank) Date Medicare IM given:   Date Additional Medicare IM given:    Discharge Disposition:    Per UR Regulation:  Reviewed for med. necessity/level of care/duration of stay  If discussed at [place] of Stay Meetings, dates discussed:    Comments:  06/17/12 Dorota Heinrichs RN,BSN NCM [phone] PT-SNF.PATIENT PLEASANTLY DECLINES SNF,& HHC.SAYS "I"LL BE ALRIGHT", IT WILL WORK ITSELF OUT.HE SAYS HE WILL GO BACK WHERE HE WAS BEFORE HE CAME TO THE HOSPITAL,BUT HE MAY GET AN APARTMENT.CSW NOTIFIED.PATIENT WAS HAPPY THAT HIS DAUGHTER DID CALL HIM TODAY,HE SAYS THEY HAVE ALOT TO WORK OUT.I ENCOURAGED HIM TO STAY IN CONTACT WITH HER.HE VOICED UNDERSTANDING.NO ANTICIPATED D/C NEEDS,STILL UNABLE TO GET A PHONE # TO SET UP A PCP APPT.  06/16/13 Jessie Cowher RN,BSN NCM [phone] 3:15P DTR LAURA CALLED ME BACK-EXPLAINED THAT SHE HAS TRIED OVER 64YRS TO HELP HER DAD,BUT HE CONTINUES TO GO [place].SHE WANTS TO KNOW HOW HE IS DOING BUT AT A DISTANCE.I ENCOURAGED HER TO CALL THE FLOOR TO ASK FOR THE DOCTOR/NURSE IF SHE CHOOSES TO SINCE HER DAD DID SAY HE WANTED HIS DAUGHTER TO KNOW HE WAS IN [place].LAURA DOES NOT WANT TO GIVE HER CELL# OUT,SHE WILL BE BE OUT OF TOWN FROM TOMORROW,HER WORK # IS THE BEST(SEE BELOW).NURSE  UPDATED.  CALLED PATIENT IN RM TO DISCUSS D/C PLANS.HE WILL CONTINUE TO STAY ON THE STREETS,HE ASKED THAT I CALL HIS DAUGHTER LAURA Erney @ [facility] & TELL HER TO COME TO SEE HIM.HE HASN'T SEEN HER IN OVER 3 MONTHS.I HAVE LEFT A MESSAGE ON TEL#[phone]-TO CALL ME BACK.HE DOES NOT HAVE A TEL#,SO UNABLE TO SET UP PCP APPT.

## 2013-06-16 NOTE — Progress Notes (Signed)
[facility] PROGRESS NOTE  Dale Mcfarland WUJ:[id number] DOB: Aug 04, 1929 DOA: 06/15/2013 PCP: No PCP Per Patient  Assessment/Plan: 1. Community acquired pneumonia -continue Rocephin and Zithromax  - FU Sputum culture  -strep pneumo negative  2. Pulm edema/elevated BNP -suspect Acute diastolic CHF-mild -continue IV lasix today, change to PO in am -ECHO with EF of 55% and LVH  3. Anemia -chronic, Iron deficient on anemia panel -start Fe at discharge -needs outpt colonoscopy  4. COPD -stable, nebs PRN  5. Homelessness -CSW consult  DVT proph: lovenox  Code Status: Full Code Family Communication: no family at bedside Disposition Plan: he is homeless   Antibiotics:  Ceftriaxone/Zithromax  HPI/Subjective: Breathing a little better  Objective: Filed Vitals:   06/16/13 1450  BP: 134/64  Pulse: 68  Temp: 98.4 F (36.9 C)  Resp: 20    Intake/Output Summary (Last 24 hours) at 06/16/13 1500 Last data filed at 06/16/13 1136  Gross per 24 hour  Intake    540 ml  Output   2800 ml  Net  -2260 ml   Filed Weights   06/15/13 2154  Weight: 63.73 kg (140 lb 8 oz)    Exam:   General:  AAOx3, unkempt, disheveled  Cardiovascular: S1S2/RRR  Respiratory: scattered wheezes, poor air movement  Abdomen: soft, Nt, BS present  Musculoskeletal: no edema c/c   Data Reviewed: Basic Metabolic Panel:  Recent Labs Lab 06/15/13 1919 06/15/13 2220 06/16/13 0353  NA 143  --  141  K 3.6*  --  3.6*  CL 107  --  102  CO2  --   --  25  GLUCOSE 71  --  80  BUN 19  --  24*  CREATININE 0.90 1.09 1.01  CALCIUM  --   --  8.6   Liver Function Tests: No results found for this basename: AST, ALT, ALKPHOS, BILITOT, PROT, ALBUMIN,  in the last 168 hours No results found for this basename: LIPASE, AMYLASE,  in the last 168 hours No results found for this basename: AMMONIA,  in the last 168 hours CBC:  Recent Labs Lab 06/15/13 1910 06/15/13 1919 06/15/13 2220  WBC  7.7  --  7.5  NEUTROABS 4.9  --   --   HGB 9.6* 10.9* 9.7*  HCT 30.8* 32.0* 31.4*  MCV 79.4  --  79.9  PLT 306  --  315   Cardiac Enzymes:  Recent Labs Lab 06/15/13 2220 06/16/13 0353 06/16/13 0935  TROPONINI <0.30 <0.30 <0.30   BNP (last 3 results)  Recent Labs  03/01/13 0645 03/09/13 2320 06/15/13 1910  PROBNP 1679.0* 675.5* 1138.0*   CBG:  Recent Labs Lab 06/15/13 2214  GLUCAP 137*    Recent Results (from the past 240 hour(s))  CULTURE, EXPECTORATED SPUTUM-ASSESSMENT     Status: None   Collection Time    06/16/13  9:34 AM      Result Value Ref Range Status   Specimen Description SPUTUM   Final   Special Requests NONE   Final   Sputum evaluation     Final   Value: THIS SPECIMEN IS ACCEPTABLE. RESPIRATORY CULTURE REPORT TO FOLLOW.   Report Status 06/16/2013 FINAL   Final     Studies: Dg Chest 2 View  06/15/2013   CLINICAL DATA:  Productive cough  EXAM: CHEST  2 VIEW  COMPARISON:  DG CHEST 2 VIEW dated 04/23/2013  FINDINGS: There are bilateral interstitial and alveolar airspace opacities which are slightly more prominent compared with the prior exam of  04/23/2013. There is no pleural effusion or pneumothorax. Stable cardiomediastinal silhouette. There is thoracic aortic atherosclerosis.  The osseous structures are unremarkable.  IMPRESSION: Bilateral interstitial and alveolar airspace opacities which may reflect mild pulmonary edema versus infection.   Electronically Signed   By: Kathreen Devoid   On: 06/15/2013 18:47    Scheduled Meds: . azithromycin  500 mg Oral Q24H  . cefTRIAXone (ROCEPHIN)  IV  1 g Intravenous Q24H  . enoxaparin (LOVENOX) injection  40 mg Subcutaneous Q24H  . furosemide  20 mg Intravenous BID  . ipratropium-albuterol  3 mL Nebulization TID  . pantoprazole  20 mg Oral Daily  . sodium chloride  3 mL Intravenous Q12H  . sodium chloride  3 mL Intravenous Q12H   Continuous Infusions:  Antibiotics Given (last 72 hours)   Date/Time Action  Medication Dose   06/15/13 1919 Given   azithromycin (ZITHROMAX) tablet 500 mg 500 mg      Principal Problem:   Pneumonia Active Problems:   CHF exacerbation   COPD exacerbation   Community acquired pneumonia    Time spent: 28min    [facility] Pager [phone]. If 7PM-7AM, please contact night-coverage at [email or web address], password [facility] 06/16/2013, 3:00 PM  LOS: 1 day

## 2013-06-17 ENCOUNTER — Inpatient Hospital Stay (HOSPITAL_COMMUNITY): Payer: Medicare Other

## 2013-06-17 LAB — LEGIONELLA ANTIGEN, URINE: Legionella Antigen, Urine: NEGATIVE

## 2013-06-17 MED ORDER — NADOLOL 20 MG PO TABS
20.0000 mg | ORAL_TABLET | Freq: Every day | ORAL | Status: DC
  Administered 2013-06-17: 20 mg via ORAL
  Filled 2013-06-17 (×2): qty 1

## 2013-06-17 MED ORDER — ALBUTEROL SULFATE (2.5 MG/3ML) 0.083% IN NEBU: 2.5000 mg | INHALATION_SOLUTION | Freq: Four times a day (QID) | RESPIRATORY_TRACT | Status: DC | PRN

## 2013-06-17 MED ORDER — TRAZODONE HCL 50 MG PO TABS
50.0000 mg | ORAL_TABLET | Freq: Once | ORAL | Status: AC
  Administered 2013-06-17: 50 mg via ORAL
  Filled 2013-06-17: qty 1

## 2013-06-17 MED ORDER — FUROSEMIDE 20 MG PO TABS
20.0000 mg | ORAL_TABLET | Freq: Every day | ORAL | Status: DC
Start: 2013-06-17 — End: 2013-06-19
  Administered 2013-06-17 – 2013-06-19 (×3): 20 mg via ORAL
  Filled 2013-06-17 (×4): qty 1

## 2013-06-17 NOTE — Progress Notes (Signed)
[facility] PROGRESS NOTE  [name] UYQ:[id number] DOB: January 03, 1930 DOA: 06/15/2013 PCP: No PCP Per Patient  Assessment/Plan: 1. Community acquired pneumonia -continue Rocephin and Zithromax  -FU Sputum culture  -strep pneumo negative  2. Pulm edema/elevated BNP -suspect Acute diastolic CHF-mild -changed to po lasix today -ECHO with EF of 55% and LVH  3. Anemia -chronic, Iron deficient on anemia panel -start Fe at discharge -needs outpt colonoscopy  4. COPD -stable, nebs PRN  5. Homelessness -CSW consult  DVT proph: lovenox  Code Status: Full Code Family Communication: no family at bedside Disposition Plan: he is homeless   Antibiotics:  Ceftriaxone/Zithromax  HPI/Subjective: Breathing ok, " i feel like shit today"  Objective: Filed Vitals:   06/17/13 1417  BP: 138/68  Pulse: 68  Temp: 97.4 F (36.3 C)  Resp: 20    Intake/Output Summary (Last 24 hours) at 06/17/13 1434 Last data filed at 06/17/13 1417  Gross per 24 hour  Intake    723 ml  Output    701 ml  Net     22 ml   Filed Weights   06/15/13 2154 06/17/13 0417  Weight: 63.73 kg (140 lb 8 oz) 60.737 kg (133 lb 14.4 oz)    Exam:   General:  AAOx3, unkempt, disheveled  Cardiovascular: S1S2/RRR  Respiratory: scattered wheezes, poor air movement  Abdomen: soft, Nt, BS present  Musculoskeletal: no edema c/c   Data Reviewed: Basic Metabolic Panel:  Recent Labs Lab 06/15/13 1919 06/15/13 2220 06/16/13 0353  NA 143  --  141  K 3.6*  --  3.6*  CL 107  --  102  CO2  --   --  25  GLUCOSE 71  --  80  BUN 19  --  24*  CREATININE 0.90 1.09 1.01  CALCIUM  --   --  8.6   Liver Function Tests: No results found for this basename: AST, ALT, ALKPHOS, BILITOT, PROT, ALBUMIN,  in the last 168 hours No results found for this basename: LIPASE, AMYLASE,  in the last 168 hours No results found for this basename: AMMONIA,  in the last 168 hours CBC:  Recent Labs Lab 06/15/13 1910  06/15/13 1919 06/15/13 2220  WBC 7.7  --  7.5  NEUTROABS 4.9  --   --   HGB 9.6* 10.9* 9.7*  HCT 30.8* 32.0* 31.4*  MCV 79.4  --  79.9  PLT 306  --  315   Cardiac Enzymes:  Recent Labs Lab 06/15/13 2220 06/16/13 0353 06/16/13 0935  TROPONINI <0.30 <0.30 <0.30   BNP (last 3 results)  Recent Labs  03/01/13 0645 03/09/13 2320 06/15/13 1910  PROBNP 1679.0* 675.5* 1138.0*   CBG:  Recent Labs Lab 06/15/13 2214  GLUCAP 137*    Recent Results (from the past 240 hour(s))  CULTURE, BLOOD (ROUTINE X 2)     Status: None   Collection Time    06/15/13 10:20 PM      Result Value Ref Range Status   Specimen Description BLOOD RIGHT ANTECUBITAL   Final   Special Requests BOTTLES DRAWN AEROBIC AND ANAEROBIC 5CC   Final   Culture  Setup Time     Final   Value: 06/16/2013 01:43     Performed at [facility]   Culture     Final   Value:        BLOOD CULTURE RECEIVED NO GROWTH TO DATE CULTURE WILL BE HELD FOR 5 DAYS BEFORE ISSUING A FINAL NEGATIVE REPORT  Performed at [facility]   Report Status PENDING   Incomplete  CULTURE, BLOOD (ROUTINE X 2)     Status: None   Collection Time    06/15/13 10:25 PM      Result Value Ref Range Status   Specimen Description BLOOD R.HAND   Final   Special Requests BOTTLES DRAWN AEROBIC AND ANAEROBIC 10CC EACH   Final   Culture  Setup Time     Final   Value: 06/16/2013 01:43     Performed at [facility]   Culture     Final   Value:        BLOOD CULTURE RECEIVED NO GROWTH TO DATE CULTURE WILL BE HELD FOR 5 DAYS BEFORE ISSUING A FINAL NEGATIVE REPORT     Performed at [facility]   Report Status PENDING   Incomplete  CULTURE, EXPECTORATED SPUTUM-ASSESSMENT     Status: None   Collection Time    06/16/13  9:34 AM      Result Value Ref Range Status   Specimen Description SPUTUM   Final   Special Requests NONE   Final   Sputum evaluation     Final   Value: THIS SPECIMEN IS ACCEPTABLE. RESPIRATORY CULTURE  REPORT TO FOLLOW.   Report Status 06/16/2013 FINAL   Final  CULTURE, RESPIRATORY (NON-EXPECTORATED)     Status: None   Collection Time    06/16/13  9:34 AM      Result Value Ref Range Status   Specimen Description SPUTUM   Final   Special Requests NONE   Final   Gram Stain     Final   Value: RARE WBC PRESENT, PREDOMINANTLY PMN     NO SQUAMOUS EPITHELIAL CELLS SEEN     NO ORGANISMS SEEN     Performed at [facility]   Culture     Final   Value: NORMAL OROPHARYNGEAL FLORA     Performed at [facility]   Report Status PENDING   Incomplete     Studies: Dg Chest 2 View  06/17/2013   CLINICAL DATA:  Followup of pneumonia versus CHF. COPD and hypertension.  EXAM: CHEST  2 VIEW  COMPARISON:  06/15/2013  FINDINGS: Mild hyperinflation. Moderate thoracic spondylosis. Remote left clavicular fracture. Midline trachea. Normal heart size with a tortuous atherosclerotic thoracic aorta. No pleural effusion or pneumothorax. Improvement in interstitial thickening, mild. Vague increased density projecting over the right mid lung is favored to represent osseous summation superimposed upon interstitial thickening. No lobar consolidation.  IMPRESSION: Improved interstitial prominence, likely due to resolved pulmonary edema. Chronic hyperinflation and interstitial thickening remain, most consistent with chronic bronchitis/ COPD.  Aortic atherosclerosis.   Electronically Signed   By: Abigail Miyamoto M.D.   On: 06/17/2013 08:20   Dg Chest 2 View  06/15/2013   CLINICAL DATA:  Productive cough  EXAM: CHEST  2 VIEW  COMPARISON:  DG CHEST 2 VIEW dated 04/23/2013  FINDINGS: There are bilateral interstitial and alveolar airspace opacities which are slightly more prominent compared with the prior exam of 04/23/2013. There is no pleural effusion or pneumothorax. Stable cardiomediastinal silhouette. There is thoracic aortic atherosclerosis.  The osseous structures are unremarkable.  IMPRESSION: Bilateral interstitial  and alveolar airspace opacities which may reflect mild pulmonary edema versus infection.   Electronically Signed   By: Kathreen Devoid   On: 06/15/2013 18:47    Scheduled Meds: . azithromycin  500 mg Oral Q24H  . cefTRIAXone (ROCEPHIN)  IV  1 g Intravenous Q24H  . enoxaparin (LOVENOX) injection  40 mg Subcutaneous Q24H  . furosemide  20 mg Oral Daily  . ipratropium-albuterol  3 mL Nebulization TID  . nicotine  21 mg Transdermal Daily  . pantoprazole  20 mg Oral Daily  . sodium chloride  3 mL Intravenous Q12H  . sodium chloride  3 mL Intravenous Q12H   Continuous Infusions:  Antibiotics Given (last 72 hours)   Date/Time Action Medication Dose Rate   06/15/13 1919 Given   azithromycin (ZITHROMAX) tablet 500 mg 500 mg    06/16/13 2024 Given   azithromycin (ZITHROMAX) tablet 500 mg 500 mg    06/16/13 2130 Given   cefTRIAXone (ROCEPHIN) 1 g in dextrose 5 % 50 mL IVPB 1 g 100 mL/hr      Principal Problem:   Pneumonia Active Problems:   CHF exacerbation   COPD exacerbation   Community acquired pneumonia    Time spent: 58min    [place] Pager [phone]. If 7PM-7AM, please contact night-coverage at [email or web address], password [facility] 06/17/2013, 2:34 PM  LOS: 2 days

## 2013-06-17 NOTE — Progress Notes (Signed)
NT reported to RN that smoke was smelled in the room. RN entered room and found pt smoking in chair. RN immediately asked pt to put out his cigarette and he complied. RN educated pt that he could not smoke in the hospital; it is a safety hazard. Pt told RN he would not smoke again. RN left door open to better monitor pt's activities. Will continue to monitor pt closely. Helayne [place]

## 2013-06-17 NOTE — Evaluation (Signed)
Physical Therapy Evaluation Patient Details Name: Dale Mcfarland MRN: [id number] DOB: Aug 07, 1929 Today's Date: 06/17/2013   History of Present Illness  80 yo male admitted with Pna. Hx of COPD, chronic back shoulder pain, homelessness.   Clinical Impression  On eval, pt required Min assist +2 for safe ambulation-able to ambulate ~115 with 2 person handheld assist. May need to consider placement if pt will agree.     Follow Up Recommendations SNF (depending on progress. Unsure if pt is safe enough to d/c back out into community at this time)    Scientist, product/process development walker with 5" wheels (depending on progress.)    Recommendations for Other Services OT consult     Precautions / Restrictions Precautions Precautions: Fall Restrictions Weight Bearing Restrictions: No      Mobility  Bed Mobility Overal bed mobility: Modified Independent                Transfers Overall transfer level: Needs assistance   Transfers: Sit to/from Stand Sit to Stand: Min assist         General transfer comment: assist to rise, stabilize.  Ambulation/Gait Ambulation/Gait assistance: Min assist;+2 safety/equipment;+2 physical assistance Ambulation Distance (Feet): 115 Feet Assistive device: 2 person hand held assist Gait Pattern/deviations: Decreased stride length;Antalgic     General Gait Details: unsteady. pt felt unable to ambulate without assistance. 2 person handheld assist throughout ambulation.   Stairs            Wheelchair Mobility    Modified Rankin (Stroke Patients Only)       Balance Overall balance assessment: Needs assistance         Standing balance support: During functional activity;Bilateral upper extremity supported Standing balance-Leahy Scale: Poor                               Pertinent Vitals/Pain "all over"-unrated    Home Living Family/patient expects to be discharged to:: Shelter/Homeless                       Prior Function Level of Independence: Independent with assistive device(s)         Comments: uses an walking stick/"staff"     Hand Dominance        Extremity/Trunk Assessment   Upper Extremity Assessment: Generalized weakness           Lower Extremity Assessment: Generalized weakness      Cervical / Trunk Assessment: Kyphotic  Communication   Communication: No difficulties  Cognition Arousal/Alertness: Awake/alert Behavior During Therapy: WFL for tasks assessed/performed Overall Cognitive Status: Within Functional Limits for tasks assessed                      General Comments      Exercises        Assessment/Plan    PT Assessment Patient needs continued PT services  PT Diagnosis Difficulty walking;Abnormality of gait   PT Problem List Decreased mobility;Decreased balance;Pain  PT Treatment Interventions DME instruction;Gait training;Functional mobility training;Therapeutic activities;Therapeutic exercise;Patient/family education;Balance training   PT Goals (Current goals can be found in the Care Plan section) Acute Rehab PT Goals Patient Stated Goal: regain independence PT Goal Formulation: With patient Time For Goal Achievement: 07/01/13 Potential to Achieve Goals: Good    Frequency Min 3X/week   Barriers to discharge        Co-evaluation  End of Session   Activity Tolerance: Patient tolerated treatment well Patient left: in bed;with call bell/phone within reach           Time: 0943-0952 PT Time Calculation (min): 9 min   Charges:   PT Evaluation $Initial PT Evaluation Tier I: 1 Procedure PT Treatments $Gait Training: 8-22 mins   PT G Codes:          Weston Anna, MPT Pager: [phone]

## 2013-06-17 NOTE — Progress Notes (Signed)
Patient's HR has been on 140's -150's  , patient stated that he did not feel his heart is beating fast and he is ok. MD made aware, will order low dose betablocker.

## 2013-06-18 DIAGNOSIS — I4891 Unspecified atrial fibrillation: Secondary | ICD-10-CM

## 2013-06-18 LAB — BASIC METABOLIC PANEL
BUN: 36 mg/dL — ABNORMAL HIGH (ref 6–23)
CALCIUM: 9.2 mg/dL (ref 8.4–10.5)
CO2: 26 meq/L (ref 19–32)
CREATININE: 1.05 mg/dL (ref 0.50–1.35)
Chloride: 99 mEq/L (ref 96–112)
GFR calc non Af Amer: 64 mL/min — ABNORMAL LOW (ref >90)
GFR, EST AFRICAN AMERICAN: 74 mL/min — AB (ref >90)
Glucose, Bld: 109 mg/dL — ABNORMAL HIGH (ref 70–99)
Potassium: 4.3 mEq/L (ref 3.7–5.3)
Sodium: 137 mEq/L (ref 137–147)

## 2013-06-18 LAB — CULTURE, RESPIRATORY W GRAM STAIN: Culture: NORMAL

## 2013-06-18 LAB — TROPONIN I: Troponin I: <0.3 ng/mL (ref <0.30)

## 2013-06-18 LAB — CULTURE, RESPIRATORY

## 2013-06-18 LAB — MAGNESIUM: Magnesium: 2.1 mg/dL (ref 1.5–2.5)

## 2013-06-18 MED ORDER — TRAZODONE HCL 50 MG PO TABS
50.0000 mg | ORAL_TABLET | Freq: Once | ORAL | Status: AC
  Administered 2013-06-18: 50 mg via ORAL
  Filled 2013-06-18: qty 1

## 2013-06-18 NOTE — Progress Notes (Signed)
PT demonstrated verbal and hands on understanding of Flutter device. 

## 2013-06-18 NOTE — Progress Notes (Signed)
[facility] PROGRESS NOTE  Dale Mcfarland RDE:[id number] DOB: 03/15/1929 DOA: 06/15/2013 PCP: No PCP Per Patient  Assessment/Plan: 1. Community acquired pneumonia -Day 3 Rocephin and Zithromax  -change to Augmentin -FU Sputum culture  -strep pneumo negative  2. Pulm edema/elevated BNP -suspect Acute diastolic CHF-mild -continue po lasix now -ECHO with EF of 55% and LVH  3. Anemia -chronic, Iron deficient on anemia panel -start Fe at discharge -needs outpt colonoscopy  4. COPD -stable, nebs PRN  5. Homelessness -CSW consult  6. NSVT -normal EF on ECHO 1/15 -check Mag, K  DVT proph: lovenox  Code Status: Full Code Family Communication: no family at bedside Disposition Plan: he is homeless   Antibiotics:  Ceftriaxone/Zithromax  HPI/Subjective: Breathing ok, doing ok  Objective: Filed Vitals:   06/18/13 0534  BP: 132/72  Pulse: 60  Temp: 97.6 F (36.4 C)  Resp: 20    Intake/Output Summary (Last 24 hours) at 06/18/13 1101 Last data filed at 06/17/13 2010  Gross per 24 hour  Intake    340 ml  Output      0 ml  Net    340 ml   Filed Weights   06/15/13 2154 06/17/13 0417 06/18/13 0600  Weight: 63.73 kg (140 lb 8 oz) 60.737 kg (133 lb 14.4 oz) 60.737 kg (133 lb 14.4 oz)    Exam:   General:  AAOx3, unkempt, disheveled  Cardiovascular: S1S2/RRR  Respiratory: scattered wheezes, poor air movement  Abdomen: soft, Nt, BS present  Musculoskeletal: no edema c/c   Data Reviewed: Basic Metabolic Panel:  Recent Labs Lab 06/15/13 1919 06/15/13 2220 06/16/13 0353 06/18/13 0800  NA 143  --  141 137  K 3.6*  --  3.6* 4.3  CL 107  --  102 99  CO2  --   --  25 26  GLUCOSE 71  --  80 109*  BUN 19  --  24* 36*  CREATININE 0.90 1.09 1.01 1.05  CALCIUM  --   --  8.6 9.2  MG  --   --   --  2.1   Liver Function Tests: No results found for this basename: AST, ALT, ALKPHOS, BILITOT, PROT, ALBUMIN,  in the last 168 hours No results found for this  basename: LIPASE, AMYLASE,  in the last 168 hours No results found for this basename: AMMONIA,  in the last 168 hours CBC:  Recent Labs Lab 06/15/13 1910 06/15/13 1919 06/15/13 2220  WBC 7.7  --  7.5  NEUTROABS 4.9  --   --   HGB 9.6* 10.9* 9.7*  HCT 30.8* 32.0* 31.4*  MCV 79.4  --  79.9  PLT 306  --  315   Cardiac Enzymes:  Recent Labs Lab 06/15/13 2220 06/16/13 0353 06/16/13 0935 06/18/13 0800  TROPONINI <0.30 <0.30 <0.30 <0.30   BNP (last 3 results)  Recent Labs  03/01/13 0645 03/09/13 2320 06/15/13 1910  PROBNP 1679.0* 675.5* 1138.0*   CBG:  Recent Labs Lab 06/15/13 2214  GLUCAP 137*    Recent Results (from the past 240 hour(s))  CULTURE, BLOOD (ROUTINE X 2)     Status: None   Collection Time    06/15/13 10:20 PM      Result Value Ref Range Status   Specimen Description BLOOD RIGHT ANTECUBITAL   Final   Special Requests BOTTLES DRAWN AEROBIC AND ANAEROBIC 5CC   Final   Culture  Setup Time     Final   Value: 06/16/2013 01:43     Performed  at [facility]     Final   Value:        BLOOD CULTURE RECEIVED NO GROWTH TO DATE CULTURE WILL BE HELD FOR 5 DAYS BEFORE ISSUING A FINAL NEGATIVE REPORT     Performed at [facility]   Report Status PENDING   Incomplete  CULTURE, BLOOD (ROUTINE X 2)     Status: None   Collection Time    06/15/13 10:25 PM      Result Value Ref Range Status   Specimen Description BLOOD R.HAND   Final   Special Requests BOTTLES DRAWN AEROBIC AND ANAEROBIC 10CC EACH   Final   Culture  Setup Time     Final   Value: 06/16/2013 01:43     Performed at [facility]   Culture     Final   Value:        BLOOD CULTURE RECEIVED NO GROWTH TO DATE CULTURE WILL BE HELD FOR 5 DAYS BEFORE ISSUING A FINAL NEGATIVE REPORT     Performed at [facility]   Report Status PENDING   Incomplete  CULTURE, EXPECTORATED SPUTUM-ASSESSMENT     Status: None   Collection Time    06/16/13  9:34 AM      Result Value  Ref Range Status   Specimen Description SPUTUM   Final   Special Requests NONE   Final   Sputum evaluation     Final   Value: THIS SPECIMEN IS ACCEPTABLE. RESPIRATORY CULTURE REPORT TO FOLLOW.   Report Status 06/16/2013 FINAL   Final  CULTURE, RESPIRATORY (NON-EXPECTORATED)     Status: None   Collection Time    06/16/13  9:34 AM      Result Value Ref Range Status   Specimen Description SPUTUM   Final   Special Requests NONE   Final   Gram Stain     Final   Value: RARE WBC PRESENT, PREDOMINANTLY PMN     NO SQUAMOUS EPITHELIAL CELLS SEEN     NO ORGANISMS SEEN     Performed at [facility]   Culture     Final   Value: NORMAL OROPHARYNGEAL FLORA     Performed at [facility]   Report Status 06/18/2013 FINAL   Final     Studies: Dg Chest 2 View  06/17/2013   CLINICAL DATA:  Followup of pneumonia versus CHF. COPD and hypertension.  EXAM: CHEST  2 VIEW  COMPARISON:  06/15/2013  FINDINGS: Mild hyperinflation. Moderate thoracic spondylosis. Remote left clavicular fracture. Midline trachea. Normal heart size with a tortuous atherosclerotic thoracic aorta. No pleural effusion or pneumothorax. Improvement in interstitial thickening, mild. Vague increased density projecting over the right mid lung is favored to represent osseous summation superimposed upon interstitial thickening. No lobar consolidation.  IMPRESSION: Improved interstitial prominence, likely due to resolved pulmonary edema. Chronic hyperinflation and interstitial thickening remain, most consistent with chronic bronchitis/ COPD.  Aortic atherosclerosis.   Electronically Signed   By: Abigail Miyamoto M.D.   On: 06/17/2013 08:20    Scheduled Meds: . azithromycin  500 mg Oral Q24H  . cefTRIAXone (ROCEPHIN)  IV  1 g Intravenous Q24H  . furosemide  20 mg Oral Daily  . ipratropium-albuterol  3 mL Nebulization TID  . nicotine  21 mg Transdermal Daily  . pantoprazole  20 mg Oral Daily  . sodium chloride  3 mL Intravenous Q12H   . sodium chloride  3 mL Intravenous Q12H   Continuous  Infusions:  Antibiotics Given (last 72 hours)   Date/Time Action Medication Dose Rate   06/15/13 1919 Given   azithromycin (ZITHROMAX) tablet 500 mg 500 mg    06/16/13 2024 Given   azithromycin (ZITHROMAX) tablet 500 mg 500 mg    06/16/13 2130 Given   cefTRIAXone (ROCEPHIN) 1 g in dextrose 5 % 50 mL IVPB 1 g 100 mL/hr   06/17/13 1805 Given   azithromycin (ZITHROMAX) tablet 500 mg 500 mg    06/17/13 2010 Given   cefTRIAXone (ROCEPHIN) 1 g in dextrose 5 % 50 mL IVPB 1 g 100 mL/hr      Principal Problem:   Pneumonia Active Problems:   CHF exacerbation   COPD exacerbation   Community acquired pneumonia    Time spent: 52min    [facility] Pager [phone]. If 7PM-7AM, please contact night-coverage at [email or web address], password [facility] 06/18/2013, 11:01 AM  LOS: 3 days

## 2013-06-18 NOTE — Progress Notes (Signed)
Physical Therapy Treatment Patient Details Name: Dale Mcfarland MRN: [id number] DOB: Sep 01, 1929 Today's Date: 06/18/2013    History of Present Illness 80 yo male admitted with Pna. Hx of COPD, chronic back shoulder pain, homelessness.     PT Comments    Pt appears at prior level of mobility.  Amb self around room.  Getting self dressed.  Amb in hallway with out any AD and at close supervision >800 feet.  Good alternating gait with slight shuffle.  NO LOB.  Performed BERG balance test pt scored 42/56 indicating some fall risk.  Greatest difficulty stepping/alternating on step and one leg stance.     Follow Up Recommendations  Other (comment);No PT follow up (shelter)  Pt appears at prior mobility level. Will update and consul LPT.   Equipment Recommendations       Recommendations for Other Services       Precautions / Restrictions Restrictions Weight Bearing Restrictions: No    Mobility  Bed Mobility Overal bed mobility: Modified Independent                Transfers Overall transfer level: Needs assistance Equipment used: None Transfers: Sit to/from Stand Sit to Stand: Supervision         General transfer comment: close supervision for safety  Ambulation/Gait Ambulation/Gait assistance: Min guard;Supervision Ambulation Distance (Feet): 800 Feet Assistive device: None Gait Pattern/deviations: Step-through pattern;Shuffle;Decreased stride length Gait velocity: decreased   General Gait Details: slight unsteady drunken/shuffled gait but no real LOB.     Stairs            Wheelchair Mobility    Modified Rankin (Stroke Patients Only)       Balance                                    Cognition                            Exercises      General Comments        Pertinent Vitals/Pain     Home Living                      Prior Function            PT Goals (current goals can now be found in the care plan  section) Progress towards PT goals: Progressing toward goals    Frequency       PT Plan      Co-evaluation             End of Session Equipment Utilized During Treatment: Gait belt Activity Tolerance: Patient tolerated treatment well Patient left: in bed;with call bell/phone within reach     Time: 1415-1440 PT Time Calculation (min): 25 min  Charges:  $Gait Training: 23-37 mins                    G Codes:      Rica Koyanagi  PTA [facility]  Acute  Rehab Pager      [phone]

## 2013-06-18 NOTE — Progress Notes (Signed)
06/18/13 1600  Standardized Balance Assessment  Standardized Balance Assessment  Berg Balance Test  Berg Balance Test  Sit to Stand 4  Standing Unsupported 4  Sitting with Back Unsupported but Feet Supported on Floor or Stool 4  Stand to Sit 4  Transfers 4  Standing Unsupported with Eyes Closed 3  Standing Ubsupported with Feet Together 2  From Standing, Reach Forward with Outstretched Arm 3  From Standing Position, Pick up Object from Floor 3  From Standing Position, Turn to Look Behind Over each Shoulder 4  Turn 360 Degrees 3  Standing Unsupported, Alternately Place Feet on Step/Stool 1  Standing Unsupported, One Foot in Front 2  Standing on One Leg 1  Total Score 42  Performed BERG Balance to indicate need for AD and assist with D/C planning. Pt stated "this is how I normally walk". Appears to be at or close to prior mobility level. Pt declines any rec for cane/walker "I won't use it"  Rica Koyanagi  PTA [facility]  Acute  Rehab Pager      [phone]

## 2013-06-18 NOTE — Progress Notes (Signed)
Patient is restless, uncooperative, took off his telemetry leads , pulled out his IV line and  Refusing to put them back. On call NP paged and made aware about pt's behavior. New order received : trazadone 50 mg po. Will cont to monitor behavior.

## 2013-06-19 DIAGNOSIS — R0989 Other specified symptoms and signs involving the circulatory and respiratory systems: Secondary | ICD-10-CM

## 2013-06-19 DIAGNOSIS — R0609 Other forms of dyspnea: Secondary | ICD-10-CM

## 2013-06-19 MED ORDER — CEFPODOXIME PROXETIL 100 MG PO TABS: 100.0000 mg | ORAL_TABLET | Freq: Two times a day (BID) | ORAL | Status: DC

## 2013-06-19 MED ORDER — ALBUTEROL SULFATE HFA 108 (90 BASE) MCG/ACT IN AERS: 2.0000 | INHALATION_SPRAY | Freq: Four times a day (QID) | RESPIRATORY_TRACT | Status: DC | PRN

## 2013-06-19 MED ORDER — FUROSEMIDE 20 MG PO TABS: 20.0000 mg | ORAL_TABLET | Freq: Every day | ORAL | Status: DC

## 2013-06-19 NOTE — Progress Notes (Signed)
Met with Pt, briefly, as he was dressed and ready to leave.  Pt stated that he does need a bus pass but that he doesn't need any info on the [facility] or [employer], as he is very familiar with both and has utilized both as a Theatre manager.  He intends to go to the liquor store and then to a friend's store upon d/c.  No further CSW needs identified.  CSW to sign off.  Bernita Raisin, [facility] [phone]

## 2013-06-19 NOTE — Discharge Summary (Signed)
Physician Discharge Summary  Dale Mcfarland JOI:[id number] DOB: 02/14/1929 DOA: 06/15/2013  PCP: No PCP Per Patient  Admit date: 06/15/2013 Discharge date: 06/19/2013  Time spent: 35 minutes  Recommendations for Outpatient Follow-up:  1. Needs PCP, unable to get one at this time due to homelessness and No telephone number  Discharge Diagnoses:  Principal Problem:   Pneumonia Active Problems:   Acute Diastolic CHF   COPD    Community acquired pneumonia   Tobacco abuse   Homelessness   Iron deficiency anemia   Discharge Condition:stable  Diet recommendation: low sodium  Filed Weights   06/17/13 0417 06/18/13 0600 06/19/13 0606  Weight: 60.737 kg (133 lb 14.4 oz) 60.737 kg (133 lb 14.4 oz) 62.642 kg (138 lb 1.6 oz)    History of present illness:  Dale Mcfarland is a 80 y.o. male, homeless with clinical history significant for COPD presenting with increased weakness and dyspnea on exertion over the last few weeks. Reports chest pain episodes that last for 30 seconds, stabbing no nausea vomiting or cold sweats and not related to exercise. Patient reports that he could walk 10 miles a day and now he can't walk only for 50 feet and then he has to sit down because of shortness of breath. No fever or chills, no reports of leg swelling lately. He reports cough with whitish sputum.      Hospital Course:  1. Community acquired pneumonia -clinically improved -Day 3 Rocephin and Zithromax  -change to Cefpodoxime at discharge   -FU Sputum culture  -strep pneumo negative   2. Pulm edema/elevated BNP  -Acute diastolic CHF-mild  -diuresed with IV lasix then changed to PO -educated on salt restriction -ECHO with EF of 55% and LVH   3. Anemia  -chronic, Iron deficient on anemia panel  -needs outpt colonoscopy   4. COPD  -stable, nebs PRN   5. Homelessness  -CSW consulted, discussed resources/shelters -she reported that he has resources to rent an appt and intends to do so after  discharge  6. NSVT 5/7 -normal EF on ECHO 1/15  -Mag, K normal,  -no further episodes noted on monitor   Discharge Exam: Filed Vitals:   06/19/13 0452  BP: 107/46  Pulse: 51  Temp: 97.2 F (36.2 C)  Resp: 16    General: AAOx3 Cardiovascular: S1S2/RRR Respiratory: CTAB  Discharge Instructions You were cared for by a hospitalist during your hospital stay. If you have any questions about your discharge medications or the care you received while you were in the hospital after you are discharged, you can call the unit and asked to speak with the hospitalist on call if the hospitalist that took care of you is not available. Once you are discharged, your primary care physician will handle any further medical issues. Please note that NO REFILLS for any discharge medications will be authorized once you are discharged, as it is imperative that you return to your primary care physician (or establish a relationship with a primary care physician if you do not have one) for your aftercare needs so that they can reassess your need for medications and monitor your lab values.  Discharge Orders   Future Orders Complete By Expires   Diet - low sodium heart healthy  As directed    Increase activity slowly  As directed        Medication List         albuterol 108 (90 BASE) MCG/ACT inhaler  Commonly known as:  PROVENTIL HFA;VENTOLIN HFA  Inhale 2 puffs into the lungs every 6 (six) hours as needed for wheezing or shortness of breath.     cefpodoxime 100 MG tablet  Commonly known as:  VANTIN  Take 1 tablet (100 mg total) by mouth 2 (two) times daily. For 3 days     furosemide 20 MG tablet  Commonly known as:  LASIX  Take 1 tablet (20 mg total) by mouth daily.       Allergies  Allergen Reactions  . Benadryl [Diphenhydramine Hcl] Other (See Comments)    Upset stomach   . Penicillins Hives and Itching       Follow-up Information   Follow up with PCP. (need to find a PCP when you get a  telephone number)        The results of significant diagnostics from this hospitalization (including imaging, microbiology, ancillary and laboratory) are listed below for reference.    Significant Diagnostic Studies: Dg Chest 2 View  06/17/2013   CLINICAL DATA:  Followup of pneumonia versus CHF. COPD and hypertension.  EXAM: CHEST  2 VIEW  COMPARISON:  06/15/2013  FINDINGS: Mild hyperinflation. Moderate thoracic spondylosis. Remote left clavicular fracture. Midline trachea. Normal heart size with a tortuous atherosclerotic thoracic aorta. No pleural effusion or pneumothorax. Improvement in interstitial thickening, mild. Vague increased density projecting over the right mid lung is favored to represent osseous summation superimposed upon interstitial thickening. No lobar consolidation.  IMPRESSION: Improved interstitial prominence, likely due to resolved pulmonary edema. Chronic hyperinflation and interstitial thickening remain, most consistent with chronic bronchitis/ COPD.  Aortic atherosclerosis.   Electronically Signed   By: Abigail Miyamoto M.D.   On: 06/17/2013 08:20   Dg Chest 2 View  06/15/2013   CLINICAL DATA:  Productive cough  EXAM: CHEST  2 VIEW  COMPARISON:  DG CHEST 2 VIEW dated 04/23/2013  FINDINGS: There are bilateral interstitial and alveolar airspace opacities which are slightly more prominent compared with the prior exam of 04/23/2013. There is no pleural effusion or pneumothorax. Stable cardiomediastinal silhouette. There is thoracic aortic atherosclerosis.  The osseous structures are unremarkable.  IMPRESSION: Bilateral interstitial and alveolar airspace opacities which may reflect mild pulmonary edema versus infection.   Electronically Signed   By: Kathreen Devoid   On: 06/15/2013 18:47    Microbiology: Recent Results (from the past 240 hour(s))  CULTURE, BLOOD (ROUTINE X 2)     Status: None   Collection Time    06/15/13 10:20 PM      Result Value Ref Range Status   Specimen  Description BLOOD RIGHT ANTECUBITAL   Final   Special Requests BOTTLES DRAWN AEROBIC AND ANAEROBIC 5CC   Final   Culture  Setup Time     Final   Value: 06/16/2013 01:43     Performed at [facility]   Culture     Final   Value:        BLOOD CULTURE RECEIVED NO GROWTH TO DATE CULTURE WILL BE HELD FOR 5 DAYS BEFORE ISSUING A FINAL NEGATIVE REPORT     Performed at [facility]   Report Status PENDING   Incomplete  CULTURE, BLOOD (ROUTINE X 2)     Status: None   Collection Time    06/15/13 10:25 PM      Result Value Ref Range Status   Specimen Description BLOOD R.HAND   Final   Special Requests BOTTLES DRAWN AEROBIC AND ANAEROBIC 10CC EACH   Final   Culture  Setup Time  Final   Value: 06/16/2013 01:43     Performed at [facility]   Culture     Final   Value:        BLOOD CULTURE RECEIVED NO GROWTH TO DATE CULTURE WILL BE HELD FOR 5 DAYS BEFORE ISSUING A FINAL NEGATIVE REPORT     Performed at [facility]   Report Status PENDING   Incomplete  CULTURE, EXPECTORATED SPUTUM-ASSESSMENT     Status: None   Collection Time    06/16/13  9:34 AM      Result Value Ref Range Status   Specimen Description SPUTUM   Final   Special Requests NONE   Final   Sputum evaluation     Final   Value: THIS SPECIMEN IS ACCEPTABLE. RESPIRATORY CULTURE REPORT TO FOLLOW.   Report Status 06/16/2013 FINAL   Final  CULTURE, RESPIRATORY (NON-EXPECTORATED)     Status: None   Collection Time    06/16/13  9:34 AM      Result Value Ref Range Status   Specimen Description SPUTUM   Final   Special Requests NONE   Final   Gram Stain     Final   Value: RARE WBC PRESENT, PREDOMINANTLY PMN     NO SQUAMOUS EPITHELIAL CELLS SEEN     NO ORGANISMS SEEN     Performed at [facility]   Culture     Final   Value: NORMAL OROPHARYNGEAL FLORA     Performed at [facility]   Report Status 06/18/2013 FINAL   Final     Labs: Basic Metabolic Panel:  Recent Labs Lab  06/15/13 1919 06/15/13 2220 06/16/13 0353 06/18/13 0800  NA 143  --  141 137  K 3.6*  --  3.6* 4.3  CL 107  --  102 99  CO2  --   --  25 26  GLUCOSE 71  --  80 109*  BUN 19  --  24* 36*  CREATININE 0.90 1.09 1.01 1.05  CALCIUM  --   --  8.6 9.2  MG  --   --   --  2.1   Liver Function Tests: No results found for this basename: AST, ALT, ALKPHOS, BILITOT, PROT, ALBUMIN,  in the last 168 hours No results found for this basename: LIPASE, AMYLASE,  in the last 168 hours No results found for this basename: AMMONIA,  in the last 168 hours CBC:  Recent Labs Lab 06/15/13 1910 06/15/13 1919 06/15/13 2220  WBC 7.7  --  7.5  NEUTROABS 4.9  --   --   HGB 9.6* 10.9* 9.7*  HCT 30.8* 32.0* 31.4*  MCV 79.4  --  79.9  PLT 306  --  315   Cardiac Enzymes:  Recent Labs Lab 06/15/13 2220 06/16/13 0353 06/16/13 0935 06/18/13 0800  TROPONINI <0.30 <0.30 <0.30 <0.30   BNP: BNP (last 3 results)  Recent Labs  03/01/13 0645 03/09/13 2320 06/15/13 1910  PROBNP 1679.0* 675.5* 1138.0*   CBG:  Recent Labs Lab 06/15/13 2214  GLUCAP 137*       Signed:  Domenic Polite  [facility] 06/19/2013, 9:50 AM

## 2013-06-19 NOTE — Progress Notes (Signed)
Discharge instructions explained and prescriptions given. Case Management notified for medication assistance. Stable for discharge.

## 2013-06-19 NOTE — Progress Notes (Signed)
Pt continue to refuse to put back telemetry leads and refusing to have an IV site. On call NP made aware. Pt awake and quiet at this time. No signs of distress noted.

## 2013-06-22 ENCOUNTER — Encounter (HOSPITAL_COMMUNITY): Payer: Self-pay | Admitting: Emergency Medicine

## 2013-06-22 ENCOUNTER — Emergency Department (HOSPITAL_COMMUNITY)
Admission: EM | Admit: 2013-06-22 | Discharge: 2013-06-22 | Disposition: A | Discharge status: Home / Self Care | Payer: Non-veteran care | Attending: Emergency Medicine | Admitting: Emergency Medicine

## 2013-06-22 ENCOUNTER — Emergency Department (HOSPITAL_COMMUNITY): Payer: Non-veteran care

## 2013-06-22 DIAGNOSIS — Y9389 Activity, other specified: Secondary | ICD-10-CM | POA: Insufficient documentation

## 2013-06-22 DIAGNOSIS — R5383 Other fatigue: Principal | ICD-10-CM

## 2013-06-22 DIAGNOSIS — Z880 Allergy status to penicillin: Secondary | ICD-10-CM | POA: Insufficient documentation

## 2013-06-22 DIAGNOSIS — Z8719 Personal history of other diseases of the digestive system: Secondary | ICD-10-CM | POA: Insufficient documentation

## 2013-06-22 DIAGNOSIS — Z5900 Homelessness unspecified: Secondary | ICD-10-CM | POA: Insufficient documentation

## 2013-06-22 DIAGNOSIS — I10 Essential (primary) hypertension: Secondary | ICD-10-CM | POA: Insufficient documentation

## 2013-06-22 DIAGNOSIS — Z862 Personal history of diseases of the blood and blood-forming organs and certain disorders involving the immune mechanism: Secondary | ICD-10-CM | POA: Insufficient documentation

## 2013-06-22 DIAGNOSIS — R296 Repeated falls: Secondary | ICD-10-CM | POA: Insufficient documentation

## 2013-06-22 DIAGNOSIS — J4489 Other specified chronic obstructive pulmonary disease: Secondary | ICD-10-CM | POA: Insufficient documentation

## 2013-06-22 DIAGNOSIS — Z8659 Personal history of other mental and behavioral disorders: Secondary | ICD-10-CM | POA: Insufficient documentation

## 2013-06-22 DIAGNOSIS — Y9210 Unspecified residential institution as the place of occurrence of the external cause: Secondary | ICD-10-CM | POA: Insufficient documentation

## 2013-06-22 DIAGNOSIS — G8929 Other chronic pain: Secondary | ICD-10-CM | POA: Insufficient documentation

## 2013-06-22 DIAGNOSIS — R531 Weakness: Secondary | ICD-10-CM

## 2013-06-22 DIAGNOSIS — R5381 Other malaise: Secondary | ICD-10-CM | POA: Insufficient documentation

## 2013-06-22 DIAGNOSIS — Z8781 Personal history of (healed) traumatic fracture: Secondary | ICD-10-CM | POA: Insufficient documentation

## 2013-06-22 DIAGNOSIS — E119 Type 2 diabetes mellitus without complications: Secondary | ICD-10-CM | POA: Insufficient documentation

## 2013-06-22 DIAGNOSIS — J449 Chronic obstructive pulmonary disease, unspecified: Secondary | ICD-10-CM | POA: Insufficient documentation

## 2013-06-22 DIAGNOSIS — Z79899 Other long term (current) drug therapy: Secondary | ICD-10-CM | POA: Insufficient documentation

## 2013-06-22 DIAGNOSIS — Z8701 Personal history of pneumonia (recurrent): Secondary | ICD-10-CM | POA: Insufficient documentation

## 2013-06-22 DIAGNOSIS — F17200 Nicotine dependence, unspecified, uncomplicated: Secondary | ICD-10-CM | POA: Insufficient documentation

## 2013-06-22 DIAGNOSIS — Z043 Encounter for examination and observation following other accident: Secondary | ICD-10-CM | POA: Insufficient documentation

## 2013-06-22 LAB — CULTURE, BLOOD (ROUTINE X 2)
Culture: NO GROWTH
Culture: NO GROWTH

## 2013-06-22 LAB — I-STAT CHEM 8, ED
BUN: 39 mg/dL — ABNORMAL HIGH (ref 6–23)
Calcium, Ion: 1.09 mmol/L — ABNORMAL LOW (ref 1.13–1.30)
Chloride: 110 mEq/L (ref 96–112)
Creatinine, Ser: 1.1 mg/dL (ref 0.50–1.35)
GLUCOSE: 80 mg/dL (ref 70–99)
HCT: 33 % — ABNORMAL LOW (ref 39.0–52.0)
Hemoglobin: 11.2 g/dL — ABNORMAL LOW (ref 13.0–17.0)
POTASSIUM: 4.4 meq/L (ref 3.7–5.3)
Sodium: 140 mEq/L (ref 137–147)
TCO2: 24 mmol/L (ref 0–100)

## 2013-06-22 LAB — ETHANOL: Alcohol, Ethyl (B): <11 mg/dL (ref 0–11)

## 2013-06-22 NOTE — ED Notes (Signed)
Pt was found by bystanders [place] steps [place] jail. Pt unsure what happened or how he got there. Pt was confused and not cooperating with EMS. Pt c/o neck pain and rt arm pain but it could be chronic. Pt allowed EMS to put him in a C-collar. Pt has hx of ETOH abuse and homeless.

## 2013-06-22 NOTE — ED Provider Notes (Signed)
CSN: [id number]     Arrival date & time 06/22/13  0226 History   First MD Initiated Contact with Patient 06/22/13 0257     Chief Complaint  Patient presents with  . Fall  . Altered Mental Status      HPI Patient was found by bystanders near the steps [place] the jail.  Patient has not room or how he got there.  He was uncooperative for EMS.  He denies chest pain shortness of breath.  He denies alcohol use.  He states he feels like he was just sleeping there.  He is homeless.  He states he's been having a normal day otherwise today.  No other complaints.  Patient does not want to be in the emergency department.   Past Medical History  Diagnosis Date  . COPD (chronic obstructive pulmonary disease)   . Chronic back pain   . Pneumonia     01/2011 - CAP vs aspiration pneuonmia  . Depression   . PONV (postoperative nausea and vomiting)   . Hyponatremia     Previously felt secondary to SIADH  . Hypertension   . Upper GI bleed     01/2011 with EGD showing severe candida esophagitis and duodenal bulb erosion  . Anemia     In the setting of UGI bleed 01/2011 requiring blood transfusion  . Homelessness   . Dyspnea     Chronic, thought due to COPD. Echo 02/2010 with EF 30-35%, nuclear study negative, then repeat echo 03/2011 did not show systolic dysfxn, so unclear if truly HF  . Bronchitis   . Diabetes mellitus without complication   . Collar bone fracture     left   Past Surgical History  Procedure Laterality Date  . Tonsillectomy    . Vasectomy    . Appendectomy    . Tonsillectomy    . Colonoscopy    . Esophagogastroduodenoscopy    . Esophagogastroduodenoscopy  02/03/2011    Procedure: ESOPHAGOGASTRODUODENOSCOPY (EGD);  Surgeon: Juanita Craver, MD;  [facility];  Service: Endoscopy;  Laterality: N/A;  . Esophagogastroduodenoscopy  02/03/2011    Procedure: ESOPHAGOGASTRODUODENOSCOPY (EGD);  Surgeon: Juanita Craver, MD;  [facility];  Service: Endoscopy;  Laterality:  N/A;  . Colonoscopy  01/30/2012    Procedure: COLONOSCOPY;  Surgeon: Ladene Artist, MD,FACG;  [facility];  Service: Endoscopy;  Laterality: N/A;   Family History  Problem Relation Age of Onset  . Coronary artery disease Father     Starting in his 42's. Died of massive MI at age 25.  . Schizophrenia Brother   . Coronary artery disease Sister     MI at age 92  . Alzheimer's disease Mother    History  Substance Use Topics  . Smoking status: Current Some Day Smoker -- 1.00 packs/day for 60 years    Types: Cigarettes  . Smokeless tobacco: Never Used     Comment: Since age 8  . Alcohol Use: Yes     Comment: 174 ml of scotch weekly    Review of Systems  All other systems reviewed and are negative.     Allergies  Benadryl and Penicillins  Home Medications   Prior to Admission medications   Medication Sig Start Date End Date Taking? Authorizing Provider  albuterol (PROVENTIL HFA;VENTOLIN HFA) 108 (90 BASE) MCG/ACT inhaler Inhale 2 puffs into the lungs every 6 (six) hours as needed for wheezing or shortness of breath. 06/19/13   Domenic Polite, MD  cefpodoxime (VANTIN) 100 MG tablet  Take 1 tablet (100 mg total) by mouth 2 (two) times daily. For 3 days 06/19/13   Domenic Polite, MD  furosemide (LASIX) 20 MG tablet Take 1 tablet (20 mg total) by mouth daily. 06/19/13   Domenic Polite, MD   BP 153/63  Pulse 65  Temp(Src) 98.4 F (36.9 C) (Oral)  Resp 16  SpO2 94% Physical Exam  Nursing note and vitals reviewed. Constitutional: He is oriented to person, place, and time. He appears well-developed and well-nourished.  HENT:  Head: Normocephalic and atraumatic.  Eyes: EOM are normal.  Neck: Normal range of motion.  C-spine nontender.  No cervical step-offs.  C-spine cleared by Nexus criteria  Cardiovascular: Normal rate, regular rhythm, normal heart sounds and intact distal pulses.   Pulmonary/Chest: Effort normal and breath sounds normal. No respiratory distress.   Abdominal: Soft. He exhibits no distension. There is no tenderness.  Musculoskeletal: Normal range of motion.  Neurological: He is alert and oriented to person, place, and time.  Skin: Skin is warm and dry.  Psychiatric: He has a normal mood and affect. Judgment normal.    ED Course  Procedures (including critical care time) Labs Review Labs Reviewed  I-STAT CHEM 8, ED - Abnormal; Notable for the following:    BUN 39 (*)    Calcium, Ion 1.09 (*)    Hemoglobin 11.2 (*)    HCT 33.0 (*)    All other components within normal limits  ETHANOL    Imaging Review Dg Shoulder Right  06/22/2013   CLINICAL DATA:  Neck and right arm pain.  Unsure if injury.  Fall.  EXAM: RIGHT SHOULDER - 2+ VIEW  COMPARISON:  DG SHOULDER *R* dated 03/02/2013  FINDINGS: The imaging field of view excludes the acromioclavicular joint. Visualized glenohumeral joint and proximal humerus appears intact.  IMPRESSION: Limited field of view. Visualized portion of the right shoulder demonstrates no acute change.   Electronically Signed   By: Lucienne Capers M.D.   On: 06/22/2013 03:46  I personally reviewed the imaging tests through PACS system I reviewed available ER/hospitalization records through the EMR     EKG Interpretation None      MDM   Final diagnoses:  Weakness    4:50 AM Patient is overall well-appearing.  Discharge home in good condition.  No indication for additional workup.  Patient has no complaints.  No signs of trauma.  No indication for head CT.  C-spine cleared by Nexus criteria.  For range of motion bilateral hips.  Ambulatory    Hoy Morn, MD 06/22/13 [phone]

## 2013-07-01 ENCOUNTER — Emergency Department (HOSPITAL_COMMUNITY): Payer: Medicare Other

## 2013-07-01 ENCOUNTER — Emergency Department (HOSPITAL_COMMUNITY)
Admission: EM | Admit: 2013-07-01 | Discharge: 2013-07-01 | Disposition: A | Discharge status: Home / Self Care | Payer: Medicare Other | Attending: Emergency Medicine | Admitting: Emergency Medicine

## 2013-07-01 ENCOUNTER — Encounter (HOSPITAL_COMMUNITY): Payer: Self-pay | Admitting: Emergency Medicine

## 2013-07-01 DIAGNOSIS — Z5900 Homelessness unspecified: Secondary | ICD-10-CM | POA: Insufficient documentation

## 2013-07-01 DIAGNOSIS — Z8719 Personal history of other diseases of the digestive system: Secondary | ICD-10-CM | POA: Insufficient documentation

## 2013-07-01 DIAGNOSIS — Z8781 Personal history of (healed) traumatic fracture: Secondary | ICD-10-CM | POA: Insufficient documentation

## 2013-07-01 DIAGNOSIS — J449 Chronic obstructive pulmonary disease, unspecified: Secondary | ICD-10-CM | POA: Insufficient documentation

## 2013-07-01 DIAGNOSIS — G8929 Other chronic pain: Secondary | ICD-10-CM | POA: Insufficient documentation

## 2013-07-01 DIAGNOSIS — Z8701 Personal history of pneumonia (recurrent): Secondary | ICD-10-CM | POA: Insufficient documentation

## 2013-07-01 DIAGNOSIS — Z9089 Acquired absence of other organs: Secondary | ICD-10-CM | POA: Insufficient documentation

## 2013-07-01 DIAGNOSIS — Z880 Allergy status to penicillin: Secondary | ICD-10-CM | POA: Insufficient documentation

## 2013-07-01 DIAGNOSIS — Z8659 Personal history of other mental and behavioral disorders: Secondary | ICD-10-CM | POA: Insufficient documentation

## 2013-07-01 DIAGNOSIS — E119 Type 2 diabetes mellitus without complications: Secondary | ICD-10-CM | POA: Insufficient documentation

## 2013-07-01 DIAGNOSIS — F17200 Nicotine dependence, unspecified, uncomplicated: Secondary | ICD-10-CM | POA: Insufficient documentation

## 2013-07-01 DIAGNOSIS — Z862 Personal history of diseases of the blood and blood-forming organs and certain disorders involving the immune mechanism: Secondary | ICD-10-CM | POA: Insufficient documentation

## 2013-07-01 DIAGNOSIS — I10 Essential (primary) hypertension: Secondary | ICD-10-CM | POA: Insufficient documentation

## 2013-07-01 DIAGNOSIS — J4489 Other specified chronic obstructive pulmonary disease: Secondary | ICD-10-CM | POA: Insufficient documentation

## 2013-07-01 LAB — I-STAT CHEM 8, ED
BUN: 17 mg/dL (ref 6–23)
CHLORIDE: 105 meq/L (ref 96–112)
Calcium, Ion: 1.15 mmol/L (ref 1.13–1.30)
Creatinine, Ser: 0.8 mg/dL (ref 0.50–1.35)
GLUCOSE: 98 mg/dL (ref 70–99)
HCT: 38 % — ABNORMAL LOW (ref 39.0–52.0)
Hemoglobin: 12.9 g/dL — ABNORMAL LOW (ref 13.0–17.0)
POTASSIUM: 3.8 meq/L (ref 3.7–5.3)
Sodium: 137 mEq/L (ref 137–147)
TCO2: 23 mmol/L (ref 0–100)

## 2013-07-01 NOTE — Discharge Instructions (Signed)
°Emergency Department Resource Guide °1) Find a Doctor and Pay Out of Pocket °Although you won't have to find out who is covered by your insurance plan, it is a good idea to ask around and get recommendations. You will then need to call the office and see if the doctor you have chosen will accept you as a new patient and what types of options they offer for patients who are self-pay. Some doctors offer discounts or will set up payment plans for their patients who do not have insurance, but you will need to ask so you aren't surprised when you get to your appointment. ° °2) Contact Your Local Health Department °Not all health departments have doctors that can see patients for sick visits, but many do, so it is worth a call to see if yours does. If you don't know where your local health department is, you can check in your phone book. The CDC also has a tool to help you locate your state's health department, and many state websites also have listings of all of their local health departments. ° °3) Find a Walk-in Clinic °If your illness is not likely to be very severe or complicated, you may want to try a walk in clinic. These are popping up all over the country in pharmacies, drugstores, and shopping centers. They're usually staffed by nurse practitioners or physician assistants that have been trained to treat common illnesses and complaints. They're usually fairly quick and inexpensive. However, if you have serious medical issues or chronic medical problems, these are probably not your best option. ° °No Primary Care Doctor: °- Call [facility] at  832-8000 - they can help you locate a primary care doctor that  accepts your insurance, provides certain services, etc. °- Physician Referral Service- 1-800-533-3463 ° °Chronic Pain Problems: °Organization         Address  Phone   Notes  °[facility]  (336) 297-2271 Patients need to be referred by their primary care doctor.  ° °Medication  Assistance: °Organization         Address  Phone   Notes  °[place] [facility] [address] °[place], [place] [address] (336) 641-8030 --Must be a resident of [place] °-- Must have NO insurance coverage whatsoever (no Medicaid/ Medicare, etc.) °-- The pt. MUST have a primary care doctor that directs their care regularly and follows them in the community °  °[facility]  (866) 331-1348   °[employer]  (888) 892-1162   ° °Agencies that provide inexpensive medical care: °Organization         Address  Phone   Notes  °[facility]  (336) 832-8035   °[facility]    (336) 832-7272   °[facility] [address] °[place], [place] [address] (336) 832-4777   °[facility] [address], °[place] (336) 271-4999   °[facility]    (336) 373-0678   °[facility]    (336) 272-1050   °[facility] ° [address], [place] Phone:  (336) 832-4444, Fax:  (336) 832-4440 Hours of Operation:  9 am - 6 pm, M-F.  Also accepts Medicaid/Medicare and self-pay.  °[facility] ° [address], [place] Phone: (336) 832-3150, Fax: (336) 832-3151. Hours of Operation:  8:30 am - 5:30 pm, M-F.  Also accepts Medicaid and self-pay.  °[facility] [place] [address], [place] Phone: (336) 878-6027   °[facility] [address], [place], [place] (336)723-1848, Ext. 123 Mondays & Thursdays: 7-9 AM.  First 15 patients are seen on a first come, first serve basis. °  ° °Medicaid-accepting [place] Providers: ° °Organization         Address  Phone   Notes  °[facility] [address], [place] (336) 641-2100 Also accepts self-pay patients.  °[facility] [address], [place] ° (336) 856-9996   °[facility] [address], [place]  (336) 288-8857   °[facility] [address] [place] [address], [place] (336) 299-7000   °[facility] [address], [place]  ° (336) 373-1557 Only accepts [place] Access Medicaid patients after they have their name applied to their card.  ° °Self-Pay (no insurance) in [place]: ° °Organization         Address  Phone   Notes  °Sickle Cell Patients, [facility] [address], [place] (336) 832-1970   °[facility] [address], [place] (336) 832-4400   °[facility] [place] ° [address], [place] (336) 992-4800   °[facility]/Dr. Osei-Bonsu ° [address] [place] [address], [place] or [address], [place] (336) 841-8500 Phone number for both [place] and [place] locations is the same.  °[facility] [address], [place] (336) 299-0000   °[facility] [address] [place] [address], [place] or [address] (336) 852-7530 °(336) 878-2260   °[facility] [address], [place] (336) 350-1642, phone; (336) 294-5005, fax Sees patients 1st and 3rd Saturday of every month.  Must not qualify for public or private insurance (i.e. Medicaid, Medicare, [place] Health Choice, Veterans' Benefits) • Household income should be no more than 200% of the poverty level •The clinic cannot treat you if you are pregnant or think you are pregnant • Sexually transmitted diseases are not treated at the clinic.  ° ° °Dental Care: °Organization         Address  Phone  Notes  °[place] [facility] [address],  (336) 641-6152 Accepts children up to age 21 who are enrolled in Medicaid or [place] Health Choice; pregnant women with a Medicaid card; and children who have applied for Medicaid or [place] Health Choice, but were declined, whose parents can pay a reduced fee at time of service.  °[place]  [facility] [place]  [address], [place] (336) 641-7733 Accepts children up to age 21 who are enrolled in Medicaid or [place] Health Choice; pregnant women with a Medicaid card; and children who have applied for Medicaid or [place] Health Choice, but were declined, whose parents can pay a reduced fee at time of service.  °[facility] ° [address],  (336) 641-4533 Patients are seen by appointment only. Walk-ins are not accepted. [facility] will see patients 18 years of age and older. °Monday - Tuesday (8am-5pm) °Most Wednesdays (8:30-5pm) °$30 per visit, cash only  °[facility] ° [address], [place] (336) 641-4533 Patients are seen by appointment only. Walk-ins are not accepted. [facility] will see patients 18 years of age and older. °One   Wednesday Evening (Monthly: Volunteer Based).  $30 per visit, cash only  °[facility]  (919) 537-3737 for adults; Children under age 4, call Graduate Pediatric Dentistry at (919) [phone]. Children aged 4-14, please call (919) [phone] to request a pediatric application. ° Dental services are provided in all areas of dental care including fillings, crowns and bridges, complete and partial dentures, implants, gum treatment, root canals, and extractions. Preventive care is also provided. Treatment is provided to both adults and children. °Patients are selected via a lottery and there is often a waiting list. °  °[facility] [address], °[place] ° (336) 763-8833 [email or web address] °  °[facility] [address], [place], [place] (336)723-1848, Ext. 123 Second and Fourth Thursday of each month, opens at 6:30 AM; Clinic ends at 9 AM.  Patients are seen on a first-come first-served basis, and a limited number are seen during each clinic.  ° °[facility] ° [address], [place], [place] (336) 723-7904    Eligibility Requirements °You must have lived in [place], [place], or [place] counties for at least the last three months. °  You cannot be eligible for state or federal sponsored healthcare insurance, including Veterans Administration, Medicaid, or Medicare. °  You generally cannot be eligible for healthcare insurance through your employer.  °  How to apply: °Eligibility screenings are held every Tuesday and Wednesday afternoon from 1:00 pm until 4:00 pm. You do not need an appointment for the interview!  °[facility] [address], [place], [place] 336-631-2330   °[facility] [facility]  336-342-8273   °[facility]  336-703-3100   °[facility]  336-570-6415   ° °Behavioral Health Resources in the Community: °Intensive Outpatient Programs °Organization         Address  Phone  Notes  °[facility] [address], [place], [place] 336-878-6098   °[facility] [address], [place], [place] 336-832-9800   °[facility] [address], [place], [place] ° 336-882-2125   °[facility] [address],  °[place], [place] 1-800-853-5163 or 336-641-4981   °Substance Abuse Resources °Organization         Address  Phone  Notes  °[facility]  336-882-2125   °[facility]  336-784-9470   °[facility]  336-285-9073   °[facility]  336-845-3988   °Residential & Outpatient Substance Abuse Program  1-800-659-3381   °Psychological Services °Organization         Address  Phone  Notes  ° Health  336- 832-9600   °[facility]  336- 378-7881   °[facility] [address], [place] 1-800-853-5163 or 336-641-4981   ° °Mobile Crisis Teams °Organization         Address  Phone  Notes  °[facility], Mobile Crisis Care Unit  1-877-626-1772   °Assertive °[facility] ° [address]  [place], [place] 336-834-9664   °Sharon DeEsch [address] °[place] 336-554-5454   ° °Self-Help/Support Groups °Organization         Address  Phone             Notes  °[facility] - variety of support groups  336- 373-1402 Call for more information  °Narcotics Anonymous (NA), [facility] [address], °[place]   2 meetings at this location  ° °  Residential Treatment Programs °Organization         Address  Phone  Notes  °[facility] [address],    °[place]  1-866-801-8205   °[facility] ° [address], [place], [place] 704-293-8524   °[facility] [address], [place] 336-845-3988 Admissions: 8am-3pm M-F  °[facility] [address]    °[place], [place] 336-841-1104   °[facility] [address], [place], [place] 336-379-7146   °[facility] [address]  °[place], [place] 336-285-9073   °[facility] - Intensive Outpatient [address], [place], [place] 336-852-3033   °[facility] [address]  °[place], [place] 1-877-615-2722 or 336-784-9470   °[facility] [address] [place], [place] 336-227-7417 Accepts Medicaid  °[facility] [address]  °[place] 1-800-659-3381 Substance Abuse/Addiction Treatment  ° °[place] Behavioral Health Resources °Organization         Address  Phone  Notes  °[facility]  (888) 581-9988   °Julie Brannon, PhD [address] [place], [place]   (336) 349-5553 or (336) 951-0000   °[facility]   [address] °[place], [place] (336) 349-4454   °[facility] [address], [place], [place] (336) 342-8316 Insurance/Medicaid/sponsorship through [facility]  °[facility] [address]                                    [place], [place] (336) 342-8316 Therapy/tele-psych/case    °[facility] [address]  ° [place], [place] (336) 349-2233    °Dr. Arfeen  (336) 349-4544   °[facility] [place]  [employer] [place] [facility] 1) [address],  °2) [address], [place] °3)  [address], [place] (336) 349-3220 °(336) 342-7768 ° °(336) 342-8140   °[place] [facility] (336) 342-1394 or (336) 342-3537 (After Hours)    ° ° °
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
## 2013-07-01 NOTE — ED Notes (Signed)
Initial Contact - pt sleeping soundly on stretcher, arousable to verbal stim.  Pt denies complaints.  Self repositioning for comfort.  NAD.

## 2013-07-01 NOTE — ED Notes (Signed)
Bed: WA04 Expected date:  Expected time:  Means of arrival:  Comments: EMS, homeless

## 2013-07-01 NOTE — ED Notes (Signed)
Patient was found downtown, laying on ground sleeping.  Store owner called EMS and patient reported he wanted to go to the hospital.  No complaints, but EMS noticed cough.  Treated here for pneumonia recently.

## 2013-07-04 ENCOUNTER — Encounter (HOSPITAL_COMMUNITY): Payer: Self-pay | Admitting: Emergency Medicine

## 2013-07-04 ENCOUNTER — Emergency Department (HOSPITAL_COMMUNITY): Payer: Medicare Other

## 2013-07-04 ENCOUNTER — Observation Stay (HOSPITAL_COMMUNITY)
Admission: EM | Admit: 2013-07-04 | Discharge: 2013-07-05 | Disposition: A | Discharge status: Home / Self Care | Payer: Medicare Other | Attending: Internal Medicine | Admitting: Internal Medicine

## 2013-07-04 DIAGNOSIS — E871 Hypo-osmolality and hyponatremia: Secondary | ICD-10-CM | POA: Insufficient documentation

## 2013-07-04 DIAGNOSIS — F329 Major depressive disorder, single episode, unspecified: Secondary | ICD-10-CM | POA: Diagnosis not present

## 2013-07-04 DIAGNOSIS — R0602 Shortness of breath: Secondary | ICD-10-CM | POA: Insufficient documentation

## 2013-07-04 DIAGNOSIS — D649 Anemia, unspecified: Secondary | ICD-10-CM | POA: Diagnosis not present

## 2013-07-04 DIAGNOSIS — R0609 Other forms of dyspnea: Secondary | ICD-10-CM | POA: Insufficient documentation

## 2013-07-04 DIAGNOSIS — K922 Gastrointestinal hemorrhage, unspecified: Secondary | ICD-10-CM | POA: Diagnosis not present

## 2013-07-04 DIAGNOSIS — I4891 Unspecified atrial fibrillation: Secondary | ICD-10-CM | POA: Diagnosis not present

## 2013-07-04 DIAGNOSIS — Z8701 Personal history of pneumonia (recurrent): Secondary | ICD-10-CM | POA: Diagnosis not present

## 2013-07-04 DIAGNOSIS — E119 Type 2 diabetes mellitus without complications: Secondary | ICD-10-CM | POA: Diagnosis not present

## 2013-07-04 DIAGNOSIS — R0989 Other specified symptoms and signs involving the circulatory and respiratory systems: Secondary | ICD-10-CM | POA: Insufficient documentation

## 2013-07-04 DIAGNOSIS — Z5900 Homelessness unspecified: Secondary | ICD-10-CM

## 2013-07-04 DIAGNOSIS — Z880 Allergy status to penicillin: Secondary | ICD-10-CM | POA: Diagnosis not present

## 2013-07-04 DIAGNOSIS — R112 Nausea with vomiting, unspecified: Secondary | ICD-10-CM | POA: Diagnosis not present

## 2013-07-04 DIAGNOSIS — G8929 Other chronic pain: Secondary | ICD-10-CM | POA: Insufficient documentation

## 2013-07-04 DIAGNOSIS — J441 Chronic obstructive pulmonary disease with (acute) exacerbation: Secondary | ICD-10-CM | POA: Diagnosis not present

## 2013-07-04 DIAGNOSIS — J3489 Other specified disorders of nose and nasal sinuses: Secondary | ICD-10-CM | POA: Insufficient documentation

## 2013-07-04 DIAGNOSIS — M549 Dorsalgia, unspecified: Secondary | ICD-10-CM | POA: Insufficient documentation

## 2013-07-04 DIAGNOSIS — Z888 Allergy status to other drugs, medicaments and biological substances status: Secondary | ICD-10-CM | POA: Insufficient documentation

## 2013-07-04 DIAGNOSIS — R059 Cough, unspecified: Secondary | ICD-10-CM | POA: Diagnosis present

## 2013-07-04 DIAGNOSIS — F17200 Nicotine dependence, unspecified, uncomplicated: Secondary | ICD-10-CM | POA: Insufficient documentation

## 2013-07-04 DIAGNOSIS — E860 Dehydration: Secondary | ICD-10-CM | POA: Diagnosis present

## 2013-07-04 DIAGNOSIS — J40 Bronchitis, not specified as acute or chronic: Secondary | ICD-10-CM | POA: Insufficient documentation

## 2013-07-04 DIAGNOSIS — Z720 Tobacco use: Secondary | ICD-10-CM

## 2013-07-04 DIAGNOSIS — J449 Chronic obstructive pulmonary disease, unspecified: Secondary | ICD-10-CM

## 2013-07-04 DIAGNOSIS — I509 Heart failure, unspecified: Secondary | ICD-10-CM | POA: Diagnosis present

## 2013-07-04 DIAGNOSIS — I10 Essential (primary) hypertension: Secondary | ICD-10-CM | POA: Diagnosis not present

## 2013-07-04 DIAGNOSIS — F3289 Other specified depressive episodes: Secondary | ICD-10-CM | POA: Diagnosis not present

## 2013-07-04 DIAGNOSIS — R05 Cough: Secondary | ICD-10-CM | POA: Diagnosis present

## 2013-07-04 DIAGNOSIS — F1020 Alcohol dependence, uncomplicated: Secondary | ICD-10-CM | POA: Diagnosis present

## 2013-07-04 DIAGNOSIS — D62 Acute posthemorrhagic anemia: Secondary | ICD-10-CM

## 2013-07-04 LAB — CBC WITH DIFFERENTIAL/PLATELET
BASOS PCT: 1 % (ref 0–1)
Basophils Absolute: 0 10*3/uL (ref 0.0–0.1)
EOS ABS: 0.2 10*3/uL (ref 0.0–0.7)
EOS PCT: 2 % (ref 0–5)
HEMATOCRIT: 31.8 % — AB (ref 39.0–52.0)
HEMOGLOBIN: 9.6 g/dL — AB (ref 13.0–17.0)
Lymphocytes Relative: 15 % (ref 12–46)
Lymphs Abs: 1.1 10*3/uL (ref 0.7–4.0)
MCH: 23.9 pg — AB (ref 26.0–34.0)
MCHC: 30.2 g/dL (ref 30.0–36.0)
MCV: 79.1 fL (ref 78.0–100.0)
MONO ABS: 0.6 10*3/uL (ref 0.1–1.0)
MONOS PCT: 8 % (ref 3–12)
Neutro Abs: 5.6 10*3/uL (ref 1.7–7.7)
Neutrophils Relative %: 74 % (ref 43–77)
Platelets: 336 10*3/uL (ref 150–400)
RBC: 4.02 MIL/uL — ABNORMAL LOW (ref 4.22–5.81)
RDW: 17.4 % — ABNORMAL HIGH (ref 11.5–15.5)
WBC: 7.5 10*3/uL (ref 4.0–10.5)

## 2013-07-04 LAB — PRO B NATRIURETIC PEPTIDE: Pro B Natriuretic peptide (BNP): 694.6 pg/mL — ABNORMAL HIGH (ref 0–450)

## 2013-07-04 LAB — I-STAT VENOUS BLOOD GAS, ED
Acid-base deficit: 1 mmol/L (ref 0.0–2.0)
Bicarbonate: 23.6 mEq/L (ref 20.0–24.0)
O2 SAT: 83 %
TCO2: 25 mmol/L (ref 0–100)
pCO2, Ven: 37.9 mmHg — ABNORMAL LOW (ref 45.0–50.0)
pH, Ven: 7.402 — ABNORMAL HIGH (ref 7.250–7.300)
pO2, Ven: 47 mmHg — ABNORMAL HIGH (ref 30.0–45.0)

## 2013-07-04 LAB — CREATININE, SERUM
Creatinine, Ser: 0.79 mg/dL (ref 0.50–1.35)
GFR calc Af Amer: >90 mL/min
GFR calc non Af Amer: 81 mL/min — ABNORMAL LOW

## 2013-07-04 LAB — ETHANOL: Alcohol, Ethyl (B): <11 mg/dL (ref 0–11)

## 2013-07-04 LAB — BASIC METABOLIC PANEL
BUN: 34 mg/dL — AB (ref 6–23)
CO2: 23 mEq/L (ref 19–32)
CREATININE: 0.85 mg/dL (ref 0.50–1.35)
Calcium: 8.9 mg/dL (ref 8.4–10.5)
Chloride: 105 mEq/L (ref 96–112)
GFR calc non Af Amer: 79 mL/min — ABNORMAL LOW (ref >90)
Glucose, Bld: 97 mg/dL (ref 70–99)
Potassium: 3.9 mEq/L (ref 3.7–5.3)
Sodium: 139 mEq/L (ref 137–147)

## 2013-07-04 LAB — CBC
HCT: 35.6 % — ABNORMAL LOW (ref 39.0–52.0)
Hemoglobin: 11.1 g/dL — ABNORMAL LOW (ref 13.0–17.0)
MCH: 24.8 pg — ABNORMAL LOW (ref 26.0–34.0)
MCHC: 31.2 g/dL (ref 30.0–36.0)
MCV: 79.6 fL (ref 78.0–100.0)
Platelets: 351 K/uL (ref 150–400)
RBC: 4.47 MIL/uL (ref 4.22–5.81)
RDW: 17.4 % — ABNORMAL HIGH (ref 11.5–15.5)
WBC: 8.3 K/uL (ref 4.0–10.5)

## 2013-07-04 LAB — POC OCCULT BLOOD, ED: FECAL OCCULT BLD: NEGATIVE

## 2013-07-04 LAB — I-STAT TROPONIN, ED: Troponin i, poc: 0.01 ng/mL (ref 0.00–0.08)

## 2013-07-04 LAB — TROPONIN I

## 2013-07-04 MED ORDER — ACETAMINOPHEN 650 MG RE SUPP: 650.0000 mg | Freq: Four times a day (QID) | RECTAL | Status: DC | PRN

## 2013-07-04 MED ORDER — IPRATROPIUM-ALBUTEROL 0.5-2.5 (3) MG/3ML IN SOLN
3.0000 mL | RESPIRATORY_TRACT | Status: DC
  Filled 2013-07-04: qty 3

## 2013-07-04 MED ORDER — DILTIAZEM HCL ER COATED BEADS 180 MG PO CP24
180.0000 mg | ORAL_CAPSULE | Freq: Every day | ORAL | Status: DC
  Administered 2013-07-04 – 2013-07-05 (×2): 180 mg via ORAL
  Filled 2013-07-04 (×2): qty 1

## 2013-07-04 MED ORDER — ALBUTEROL SULFATE (2.5 MG/3ML) 0.083% IN NEBU: 2.5000 mg | INHALATION_SOLUTION | Freq: Three times a day (TID) | RESPIRATORY_TRACT | Status: DC

## 2013-07-04 MED ORDER — PREDNISONE 20 MG PO TABS
60.0000 mg | ORAL_TABLET | Freq: Once | ORAL | Status: AC
  Administered 2013-07-04: 60 mg via ORAL
  Filled 2013-07-04: qty 3

## 2013-07-04 MED ORDER — ALBUTEROL SULFATE (2.5 MG/3ML) 0.083% IN NEBU
5.0000 mg | INHALATION_SOLUTION | Freq: Once | RESPIRATORY_TRACT | Status: AC
  Administered 2013-07-04: 5 mg via RESPIRATORY_TRACT
  Filled 2013-07-04: qty 6

## 2013-07-04 MED ORDER — SODIUM CHLORIDE 0.9 % IV SOLN
500.0000 mg | Freq: Once | INTRAVENOUS | Status: AC
  Administered 2013-07-04: 500 mg via INTRAVENOUS
  Filled 2013-07-04: qty 500

## 2013-07-04 MED ORDER — ALBUTEROL SULFATE (2.5 MG/3ML) 0.083% IN NEBU
2.5000 mg | INHALATION_SOLUTION | Freq: Four times a day (QID) | RESPIRATORY_TRACT | Status: DC | PRN
Start: 2013-07-04 — End: 2013-07-04

## 2013-07-04 MED ORDER — ONDANSETRON HCL 4 MG PO TABS: 4.0000 mg | ORAL_TABLET | Freq: Four times a day (QID) | ORAL | Status: DC | PRN

## 2013-07-04 MED ORDER — ALUM & MAG HYDROXIDE-SIMETH 200-200-20 MG/5ML PO SUSP: 30.0000 mL | Freq: Four times a day (QID) | ORAL | Status: DC | PRN

## 2013-07-04 MED ORDER — VANCOMYCIN HCL IN DEXTROSE 1-5 GM/200ML-% IV SOLN
1000.0000 mg | Freq: Once | INTRAVENOUS | Status: AC
  Administered 2013-07-04: 1000 mg via INTRAVENOUS
  Filled 2013-07-04: qty 200

## 2013-07-04 MED ORDER — SODIUM CHLORIDE 0.9 % IJ SOLN
3.0000 mL | Freq: Two times a day (BID) | INTRAMUSCULAR | Status: DC
  Administered 2013-07-04 – 2013-07-05 (×2): 3 mL via INTRAVENOUS

## 2013-07-04 MED ORDER — IPRATROPIUM-ALBUTEROL 0.5-2.5 (3) MG/3ML IN SOLN: 3.0000 mL | RESPIRATORY_TRACT | Status: DC | PRN

## 2013-07-04 MED ORDER — SODIUM CHLORIDE 0.9 % IV SOLN
INTRAVENOUS | Status: DC
  Administered 2013-07-04: 1000 mL via INTRAVENOUS

## 2013-07-04 MED ORDER — DILTIAZEM HCL 25 MG/5ML IV SOLN
20.0000 mg | Freq: Once | INTRAVENOUS | Status: AC
  Administered 2013-07-04: 20 mg via INTRAVENOUS
  Filled 2013-07-04: qty 5

## 2013-07-04 MED ORDER — PREDNISONE 20 MG PO TABS
40.0000 mg | ORAL_TABLET | Freq: Every day | ORAL | Status: DC
  Administered 2013-07-05: 40 mg via ORAL
  Filled 2013-07-04 (×2): qty 2

## 2013-07-04 MED ORDER — ACETAMINOPHEN 325 MG PO TABS: 650.0000 mg | ORAL_TABLET | Freq: Four times a day (QID) | ORAL | Status: DC | PRN

## 2013-07-04 MED ORDER — OXYCODONE HCL 5 MG PO TABS: 5.0000 mg | ORAL_TABLET | ORAL | Status: DC | PRN

## 2013-07-04 MED ORDER — ONDANSETRON HCL 4 MG/2ML IJ SOLN: 4.0000 mg | Freq: Four times a day (QID) | INTRAMUSCULAR | Status: DC | PRN

## 2013-07-04 NOTE — ED Notes (Signed)
attempted report 

## 2013-07-04 NOTE — ED Provider Notes (Signed)
Pt care received from Dr. Lemar Livings.  Pt hypoxemic at rest and with ambulation.  Lungs c scattered rhonchi after neb.  Intermittent A Fib c RVR.  Pt on Oliver 2l.  Cardizem ordered for AF/RVR.  Pt Hb at 9.6. Over last 20 days has ranged from 9.0-12.6. Guaiac negative.  X ray with bilateral interstitial pattern.  differnential includes viral pneumonitis/bronchitis with COPD exacerbation, CHF exacerbated by AF/RVR. Echo 1/15 with EF of 55%.   BLC obtainned and given IV ABX earlier by Dr. Lemar Livings.      Tanna Furry, MD 07/04/13 [phone]

## 2013-07-04 NOTE — ED Notes (Signed)
MD Cheri Guppy informed of patient's Tachycardia.

## 2013-07-04 NOTE — H&P (Signed)
[facility] History and Physical  [name] CBJ:[id number] DOB: [birth year] DOA: 07/04/2013  Referring physician:  PCP: No PCP Per Patient   Chief Complaint: "I have bronchitis"  HPI: Dale Mcfarland is a 80 y.o. male with a past medical history of chronic obstructive pulmonary disease, tobacco abuse, homelessness, recently discharged from the medicine service on 06/19/2013 at which time he was treated for community-acquired pneumonia with azithromycin and ceftriaxone, discharged on Cefpodoxime. prior to discharge social work was consulted as patient was given information on local shelters. He was recently seen at [facility] on 07/01/2013 at which time he woke up and down town and requested transport to the emergency room. He complained of cough and then, with chest x-ray showing no acute findings. He was discharged from the emergency room in stable condition. He arrived today with complaints of cough and congestion stating "spitting up a lot" and "I have bronchitis."  In the emergency room he was found to be in A. fib with RVR having ventricular rates in the 130s. Heart rates improved after the administration of IV Cardizem. He presently denies shortness of breath, cough, chest pain, abdominal pain, hematuria, fevers or chills.                                                                                                                                                                                                     Review of Systems:  Constitutional:  No weight loss, night sweats, Fevers, chills, fatigue.  HEENT:  No headaches, Difficulty swallowing,Tooth/dental problems,Sore throat,  No sneezing, itching, ear ache, nasal congestion, post nasal drip,  Cardio-vascular:  No chest pain, Orthopnea, PND, swelling in lower extremities, anasarca, dizziness, palpitations  GI:  No heartburn, indigestion, abdominal pain, nausea, vomiting, diarrhea, change in bowel habits, loss of  appetite  Resp:  Positive for shortness of breath with exertion or at rest, with excess mucus, no productive cough, No non-productive cough, No coughing up of blood.No change in color of mucus.No wheezing.No chest wall deformity  Skin:  no rash or lesions.  GU:  no dysuria, change in color of urine, no urgency or frequency. No flank pain.  Musculoskeletal:  No joint pain or swelling. No decreased range of motion. No back pain.  Psych:  No change in mood or affect. No depression or anxiety. No memory loss.   Past Medical History  Diagnosis Date  . COPD (chronic obstructive pulmonary disease)   . Chronic back pain   . Pneumonia     01/2011 - CAP vs aspiration pneuonmia  . Depression   . PONV (  postoperative nausea and vomiting)   . Hyponatremia     Previously felt secondary to SIADH  . Hypertension   . Upper GI bleed     01/2011 with EGD showing severe candida esophagitis and duodenal bulb erosion  . Anemia     In the setting of UGI bleed 01/2011 requiring blood transfusion  . Homelessness   . Dyspnea     Chronic, thought due to COPD. Echo 02/2010 with EF 30-35%, nuclear study negative, then repeat echo 03/2011 did not show systolic dysfxn, so unclear if truly HF  . Bronchitis   . Diabetes mellitus without complication   . Collar bone fracture     left   Past Surgical History  Procedure Laterality Date  . Tonsillectomy    . Vasectomy    . Appendectomy    . Tonsillectomy    . Colonoscopy    . Esophagogastroduodenoscopy    . Esophagogastroduodenoscopy  02/03/2011    Procedure: ESOPHAGOGASTRODUODENOSCOPY (EGD);  Surgeon: Juanita Craver, MD;  Location: [facility];  Service: Endoscopy;  Laterality: N/A;  . Esophagogastroduodenoscopy  02/03/2011    Procedure: ESOPHAGOGASTRODUODENOSCOPY (EGD);  Surgeon: Juanita Craver, MD;  Location: [facility];  Service: Endoscopy;  Laterality: N/A;  . Colonoscopy  01/30/2012    Procedure: COLONOSCOPY;  Surgeon: Ladene Artist, MD,FACG;  Location:  [facility];  Service: Endoscopy;  Laterality: N/A;   Social History:  reports that he has been smoking Cigarettes.  He has a 60 pack-year smoking history. He has never used smokeless tobacco. He reports that he drinks alcohol. He reports that he does not use illicit drugs.  Allergies  Allergen Reactions  . Benadryl [Diphenhydramine Hcl] Other (See Comments)    Upset stomach   . Penicillins Hives and Itching    Family History  Problem Relation Age of Onset  . Coronary artery disease Father     Starting in his 46's. Died of massive MI at age 70.  . Schizophrenia Brother   . Coronary artery disease Sister     MI at age 52  . Alzheimer's disease Mother      Prior to Admission medications   Not on File   Physical Exam: Filed Vitals:   07/04/13 1345  BP: 160/73  Pulse: 64  Temp: 98.4 F (36.9 C)  Resp: 16    BP 160/73  Pulse 64  Temp(Src) 98.4 F (36.9 C) (Oral)  Resp 16  SpO2 96%  General: Disheveled. Appears calm and comfortable, in no acute distress. Breathing comfortably in room air Eyes: PERRL, normal lids, irises & conjunctiva ENT: grossly normal hearing, lips & tongue Neck: no LAD, masses or thyromegaly Cardiovascular: Irregular rate and rhythm, no m/r/g. No LE edema. Telemetry: SR, no arrhythmias  Respiratory: Positive bilateral rhonchi, expiratory wheezes, breathing comfortably room air he is in no respiratory distress during my counter Abdomen: soft, ntnd Skin: no rash or induration seen on limited exam Musculoskeletal: grossly normal tone BUE/BLE Psychiatric: grossly normal mood and affect, speech fluent and appropriate Neurologic: grossly non-focal.          Labs on Admission:  Basic Metabolic Panel:  Recent Labs Lab 07/01/13 1212 07/04/13 0845  NA 137 139  K 3.8 3.9  CL 105 105  CO2  --  23  GLUCOSE 98 97  BUN 17 34*  CREATININE 0.80 0.85  CALCIUM  --  8.9   Liver Function Tests: No results found for this basename: AST, ALT, ALKPHOS,  BILITOT, PROT, ALBUMIN,  in the last 168  hours No results found for this basename: LIPASE, AMYLASE,  in the last 168 hours No results found for this basename: AMMONIA,  in the last 168 hours CBC:  Recent Labs Lab 07/01/13 1212 07/04/13 0845  WBC  --  7.5  NEUTROABS  --  5.6  HGB 12.9* 9.6*  HCT 38.0* 31.8*  MCV  --  79.1  PLT  --  336   Cardiac Enzymes:  Recent Labs Lab 07/04/13 1200  TROPONINI <0.30    BNP (last 3 results)  Recent Labs  03/09/13 2320 06/15/13 1910 07/04/13 0845  PROBNP 675.5* 1138.0* 694.6*   CBG: No results found for this basename: GLUCAP,  in the last 168 hours  Radiological Exams on Admission: Dg Chest 2 View  07/04/2013   CLINICAL DATA:  Cough, congestion  EXAM: CHEST  2 VIEW  COMPARISON:  Prior chest x-ray 07/01/2013  FINDINGS: Diffuse peribronchial thickening and bronchovascular cuffing. Inspiratory volumes are slightly lower than the recent prior chest x-ray. Stable cardiomegaly. Tortuous and atherosclerotic thoracic aorta. Slightly increased interstitial prominence. No new focal airspace consolidation, pleural effusion or pneumothorax. No acute osseous abnormality.  IMPRESSION: Slight interval progression of diffuse bronchial wall thickening and interstitial prominence. Findings can be seen in acute bronchitis and viral or atypical pneumonia.   Electronically Signed   By: Jacqulynn Cadet M.D.   On: 07/04/2013 07:14    EKG: Independently reviewed. A. fib with RVR  Assessment/Plan Active Problems:   Atrial fibrillation with rapid ventricular response   Alcoholism   Dehydration   CHF (congestive heart failure)   Homelessness   A-fib   1. Probable Mild COPD exacerbation. Patient presenting with complaints of increased mucus production and some wheezing on exam. He is breathing comfortably on room air however, satting 96%. Does not appear to be in acute respiratory distress. Will provide duo nebs every 4 hours scheduled and prednisone 40mg   PO q daily. Possible underlying viral pneumonia may have precipitated event. Patient was administered vancomycin and Primaxin in the emergency department. 2. A. fib with RVR. Patient found to be in A. fib with RVR with ventricular rates in the 130s, now resolved after the administration of IV Cardizem. Most recent heart rate of 68. Start oral Cardizem 180 mg by mouth daily. He had a transthoracic echocardiogram performed on 03/01/2013 that showed ejection fraction of 55% without wall motion abnormalities. He had a TSH on 06/15/2013 within normal range at 2.68. Will check a urine drug tests and alcohol level.  3. Probable viral pneumonia. Suspect underlying viral pneumonia precipitating mild COPD exacerbation. Patient is nontoxic, afebrile, white count at 7500, breathing comfortably on room air. A chest x-ray showed diffuse bronchial wall thickening and interstitial prominence. Will repeat chest x-ray in a.m. for followup. Meanwhile hold off on antimicrobial therapy unless he clinically deteriorates or spikes a temperature.  4. Homelessness. Patient stating he is intent on going back out into the streets upon discharge however has considered renting an apartment. 5. DVT prophylaxis. SCDs    Code Status: Full Family Communication: Disposition Plan: Will place in overnight obs Time spent: 55 min  [facility] Pager [phone]  **Disclaimer: This note may have been dictated with voice recognition software. Similar sounding words can inadvertently be transcribed and this note may contain transcription errors which may not have been corrected upon publication of note.**

## 2013-07-04 NOTE — ED Provider Notes (Signed)
CSN: [id number]     Arrival date & time 07/04/13  0504 History   First MD Initiated Contact with Patient 07/04/13 0531     Chief Complaint  Patient presents with  . Cough  . Nasal Congestion     (Consider location/radiation/quality/duration/timing/severity/associated sxs/prior Treatment) HPI  Please note that this is a late entry. The patient was seen by me shortly after his presentation to the emergency department.  He is a homeless 80 year old man who has a history of COPD he smokes about one to 2 cigarettes per week. He presents with wheezing and a productive cough. He also has had intermittent shortness of breath. He denies orthopnea. He also denies chest pain. He has not had a subjective fever he has not measured his temperature with a thermometer. He sleeps outside.  In addition to COPD, the patient's past medical history is significant for chronic bronchitis, hypertension.   Past Medical History  Diagnosis Date  . COPD (chronic obstructive pulmonary disease)   . Chronic back pain   . Pneumonia     01/2011 - CAP vs aspiration pneuonmia  . Depression   . PONV (postoperative nausea and vomiting)   . Hyponatremia     Previously felt secondary to SIADH  . Hypertension   . Upper GI bleed     01/2011 with EGD showing severe candida esophagitis and duodenal bulb erosion  . Anemia     In the setting of UGI bleed 01/2011 requiring blood transfusion  . Homelessness   . Dyspnea     Chronic, thought due to COPD. Echo 02/2010 with EF 30-35%, nuclear study negative, then repeat echo 03/2011 did not show systolic dysfxn, so unclear if truly HF  . Bronchitis   . Diabetes mellitus without complication   . Collar bone fracture     left   Past Surgical History  Procedure Laterality Date  . Tonsillectomy    . Vasectomy    . Appendectomy    . Tonsillectomy    . Colonoscopy    . Esophagogastroduodenoscopy    . Esophagogastroduodenoscopy  02/03/2011    Procedure:  ESOPHAGOGASTRODUODENOSCOPY (EGD);  Surgeon: Juanita Craver, MD;  Location: [facility];  Service: Endoscopy;  Laterality: N/A;  . Esophagogastroduodenoscopy  02/03/2011    Procedure: ESOPHAGOGASTRODUODENOSCOPY (EGD);  Surgeon: Juanita Craver, MD;  Location: [facility];  Service: Endoscopy;  Laterality: N/A;  . Colonoscopy  01/30/2012    Procedure: COLONOSCOPY;  Surgeon: Ladene Artist, MD,FACG;  Location: [facility];  Service: Endoscopy;  Laterality: N/A;   Family History  Problem Relation Age of Onset  . Coronary artery disease Father     Starting in his 55's. Died of massive MI at age 74.  . Schizophrenia Brother   . Coronary artery disease Sister     MI at age 90  . Alzheimer's disease Mother    History  Substance Use Topics  . Smoking status: Current Some Day Smoker -- 1.00 packs/day for 60 years    Types: Cigarettes  . Smokeless tobacco: Never Used     Comment: Since age 66  . Alcohol Use: Yes     Comment: 174 ml of scotch weekly    Review of Systems Ten point review of symptoms performed and is negative with the exception of symptoms noted above.     Allergies  Benadryl and Penicillins  Home Medications   Prior to Admission medications   Not on File   BP 108/73  Pulse 76  Temp(Src) 97.7 F (36.5 C) (  Oral)  Resp 20  SpO2 96% Physical Exam Gen: well developed and well nourished appearing, disheveled and poorly grommed Head: NCAT Eyes: PERL, EOMI Nose: no epistaixis or rhinorrhea Mouth/throat: mucosa is moist and pink, edentulous Neck: supple, no stridor Lungs: RR 24/min, diffuse wheezing with diminished air movement in the bases bilaterally. No acessory muscle use or retractions.  CV: RRR, no murmur, extremities appear well perfused.  Abd: soft, notender, nondistended Back: kyphosis, otherwise normal to inspection. no ttp Skin: warm and dry Ext: normal to inspection, no dependent edema Neuro: CN ii-xii grossly intact, no focal deficits Psyche; normal  affect,  calm and cooperative.   ED Course  Procedures (including critical care time) Labs Review  Labs pending  Imaging Review Dg Chest 2 View  07/04/2013   CLINICAL DATA:  Cough, congestion  EXAM: CHEST  2 VIEW  COMPARISON:  Prior chest x-ray 07/01/2013  FINDINGS: Diffuse peribronchial thickening and bronchovascular cuffing. Inspiratory volumes are slightly lower than the recent prior chest x-ray. Stable cardiomegaly. Tortuous and atherosclerotic thoracic aorta. Slightly increased interstitial prominence. No new focal airspace consolidation, pleural effusion or pneumothorax. No acute osseous abnormality.  IMPRESSION: Slight interval progression of diffuse bronchial wall thickening and interstitial prominence. Findings can be seen in acute bronchitis and viral or atypical pneumonia.   Electronically Signed   By: Jacqulynn Cadet M.D.   On: 07/04/2013 07:14      MDM   Patient with persistent wheezing and diminished BS in bases despite tx with multiple nebs and steroid medication. His CXR is concerning for pneumonia. We will treat for HCAP as the patient was just discharged from this hospital on 06/19/13 after inpatient tx for pneumonia. We are awaiting labs. Dr. Jeneen Rinks to assume care at shift change, follow up on labs and determine most appropriate disposition.     Elyn Peers, MD 07/04/13 [phone]

## 2013-07-04 NOTE — ED Notes (Signed)
Patient arrives with complaint of cough and congestion. Patient was seen at [facility] about 2 days ago with diagnosis of flu and bronchitis. EMS reported "puddles of fluid" of unknown origin surrounding patient.

## 2013-07-05 ENCOUNTER — Inpatient Hospital Stay (HOSPITAL_COMMUNITY): Payer: Medicare Other

## 2013-07-05 DIAGNOSIS — D62 Acute posthemorrhagic anemia: Secondary | ICD-10-CM

## 2013-07-05 DIAGNOSIS — J441 Chronic obstructive pulmonary disease with (acute) exacerbation: Principal | ICD-10-CM

## 2013-07-05 LAB — BASIC METABOLIC PANEL
BUN: 21 mg/dL (ref 6–23)
CO2: 21 meq/L (ref 19–32)
CREATININE: 0.83 mg/dL (ref 0.50–1.35)
Calcium: 8.7 mg/dL (ref 8.4–10.5)
Chloride: 105 mEq/L (ref 96–112)
GFR calc Af Amer: >90 mL/min (ref >90)
GFR calc non Af Amer: 80 mL/min — ABNORMAL LOW (ref >90)
GLUCOSE: 135 mg/dL — AB (ref 70–99)
Potassium: 3.9 mEq/L (ref 3.7–5.3)
Sodium: 136 mEq/L — ABNORMAL LOW (ref 137–147)

## 2013-07-05 LAB — CBC
HEMATOCRIT: 30.2 % — AB (ref 39.0–52.0)
Hemoglobin: 9.7 g/dL — ABNORMAL LOW (ref 13.0–17.0)
MCH: 25.3 pg — ABNORMAL LOW (ref 26.0–34.0)
MCHC: 32.1 g/dL (ref 30.0–36.0)
MCV: 78.6 fL (ref 78.0–100.0)
Platelets: 305 10*3/uL (ref 150–400)
RBC: 3.84 MIL/uL — ABNORMAL LOW (ref 4.22–5.81)
RDW: 17 % — AB (ref 11.5–15.5)
WBC: 8.6 10*3/uL (ref 4.0–10.5)
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
## 2013-07-05 NOTE — Discharge Summary (Signed)
AMA NOTE   Patient ID: Dale Mcfarland MRN: [id number] DOB/AGE: [birth year] 80 y.o.  Admit date: 07/04/2013 Discharge date: 07/05/2013  Primary Care Physician:  No PCP Per Patient  Discharge Diagnoses:    . Atrial fibrillation with rapid ventricular response . Alcoholism . Dehydration . CHF (congestive heart failure) . A-fib  Consults:  None     Allergies:   Allergies  Allergen Reactions  . Benadryl [Diphenhydramine Hcl] Other (See Comments)    Upset stomach   . Penicillins Hives and Itching     Discharge Medications: Please note that patient left AMA    Medication List    Notice   You have not been prescribed any medications.       Brief H and P: For complete details please refer to admission H and P, but in brief patient is 80 year old male with past medical history of COPD, tobacco abuse, homeless, recently discharged on 06/19/2013 when he was treated for community acquired pneumonia, placed on antibiotics, he was recently seen in [facility] on 07/01/2013 and chest X. ray had shown no acute findings. Patient arrived to the ER on 07/04/2013 complaining of cough, congestion and spitting up a lot. In the ER patient was found to be in atrial fibrillation with RVR, with heart rate in 130s. Patient's heart rate improved after administration of IV Cardizem. He was admitted for a perforation in acute bronchitis/COPD exacerbation.  Hospital Course:   atrial fibrillation with RVR: Heart rate was improved after patient was placed on IV Cardizem and started subsequently on oral Cardizem.  Nicotine abuse with COPD exacerbation Patient still has wheezing and was receiving IV antibiotics with nebulizer breathing treatments. Patient was found to be smoking in the room. Security was also called and patient was counseled strongly to quit smoking. Patient took his IV off and signed out  AMA. Patient was counseled the risk of atrial fibrillation returning without the  prescriptions, risk of stroke, heart attack, pneumonia.    Day of Discharge patient left AMA BP 140/64  Pulse 71  Temp(Src) 98.3 F (36.8 C) (Oral)  Resp 16  SpO2 96%  Physical Exam: General: Alert and awake oriented x3 not in any acute distress. HEENT: anicteric sclera, pupils reactive to light and accommodation CVS: S1-S2 clear no murmur rubs or gallops Chest: Diffuse wheezing bilaterally Abdomen: soft nontender, nondistended, normal bowel sounds Extremities: no cyanosis, clubbing or edema noted bilaterally    The results of significant diagnostics from this hospitalization (including imaging, microbiology, ancillary and laboratory) are listed below for reference.    LAB RESULTS: Basic Metabolic Panel:  Recent Labs Lab 07/04/13 0845 07/04/13 1546 07/05/13 0308  NA 139  --  136*  K 3.9  --  3.9  CL 105  --  105  CO2 23  --  21  GLUCOSE 97  --  135*  BUN 34*  --  21  CREATININE 0.85 0.79 0.83  CALCIUM 8.9  --  8.7   Liver Function Tests: No results found for this basename: AST, ALT, ALKPHOS, BILITOT, PROT, ALBUMIN,  in the last 168 hours No results found for this basename: LIPASE, AMYLASE,  in the last 168 hours No results found for this basename: AMMONIA,  in the last 168 hours CBC:  Recent Labs Lab 07/04/13 0845 07/04/13 1546 07/05/13 0308  WBC 7.5 8.3 8.6  NEUTROABS 5.6  --   --   HGB 9.6* 11.1* 9.7*  HCT 31.8* 35.6* 30.2*  MCV 79.1 79.6 78.6  PLT  336 351 305   Cardiac Enzymes:  Recent Labs Lab 07/04/13 1200  TROPONINI <0.30   BNP: No components found with this basename: POCBNP,  CBG: No results found for this basename: GLUCAP,  in the last 168 hours  Significant Diagnostic Studies:  Dg Chest 2 View  07/05/2013   CLINICAL DATA:  Pneumonia.  EXAM: CHEST  2 VIEW  COMPARISON:  07/04/2013 and 07/01/2013 and 02/19/2013  FINDINGS: There is tortuosity and calcification of the thoracic aorta. There is peribronchial thickening with diffuse slight  accentuation of the interstitial markings. No consolidative infiltrates or effusions. No acute osseous abnormality.  IMPRESSION: I think at the findings most likely represent mild congestive heart failure. Pulmonary vascularity is slightly less prominent than on the prior exam.   Electronically Signed   By: Rozetta Nunnery M.D.   On: 07/05/2013 08:59   Dg Chest 2 View  07/04/2013   CLINICAL DATA:  Cough, congestion  EXAM: CHEST  2 VIEW  COMPARISON:  Prior chest x-ray 07/01/2013  FINDINGS: Diffuse peribronchial thickening and bronchovascular cuffing. Inspiratory volumes are slightly lower than the recent prior chest x-ray. Stable cardiomegaly. Tortuous and atherosclerotic thoracic aorta. Slightly increased interstitial prominence. No new focal airspace consolidation, pleural effusion or pneumothorax. No acute osseous abnormality.  IMPRESSION: Slight interval progression of diffuse bronchial wall thickening and interstitial prominence. Findings can be seen in acute bronchitis and viral or atypical pneumonia.   Electronically Signed   By: Jacqulynn Cadet M.D.   On: 07/04/2013 07:14       Disposition and Follow-up: None as patient left AMA    Time spent on Discharge: 35 minutes  Signed:   Shanetra Blumenstock Krystal Eaton M.D. [facility] 07/05/2013, 10:40 AM Pager: [phone]   **Disclaimer: This note was dictated with voice recognition software. Similar sounding words can inadvertently be transcribed and this note may contain transcription errors which may not have been corrected upon publication of note.**

## 2013-07-10 LAB — CULTURE, BLOOD (ROUTINE X 2)
CULTURE: NO GROWTH
Culture: NO GROWTH

## 2013-07-25 ENCOUNTER — Encounter (HOSPITAL_COMMUNITY): Payer: Self-pay | Admitting: Emergency Medicine

## 2013-07-25 ENCOUNTER — Emergency Department (HOSPITAL_COMMUNITY)
Admission: EM | Admit: 2013-07-25 | Discharge: 2013-07-25 | Disposition: A | Discharge status: Home / Self Care | Payer: Medicare Other | Attending: Emergency Medicine | Admitting: Emergency Medicine

## 2013-07-25 ENCOUNTER — Emergency Department (HOSPITAL_COMMUNITY): Payer: Medicare Other

## 2013-07-25 DIAGNOSIS — J441 Chronic obstructive pulmonary disease with (acute) exacerbation: Secondary | ICD-10-CM | POA: Insufficient documentation

## 2013-07-25 DIAGNOSIS — Z5900 Homelessness unspecified: Secondary | ICD-10-CM | POA: Insufficient documentation

## 2013-07-25 DIAGNOSIS — M25529 Pain in unspecified elbow: Secondary | ICD-10-CM | POA: Insufficient documentation

## 2013-07-25 DIAGNOSIS — F17200 Nicotine dependence, unspecified, uncomplicated: Secondary | ICD-10-CM | POA: Insufficient documentation

## 2013-07-25 DIAGNOSIS — I10 Essential (primary) hypertension: Secondary | ICD-10-CM | POA: Insufficient documentation

## 2013-07-25 DIAGNOSIS — Z8659 Personal history of other mental and behavioral disorders: Secondary | ICD-10-CM | POA: Insufficient documentation

## 2013-07-25 DIAGNOSIS — Z862 Personal history of diseases of the blood and blood-forming organs and certain disorders involving the immune mechanism: Secondary | ICD-10-CM | POA: Insufficient documentation

## 2013-07-25 DIAGNOSIS — E119 Type 2 diabetes mellitus without complications: Secondary | ICD-10-CM | POA: Insufficient documentation

## 2013-07-25 DIAGNOSIS — Z880 Allergy status to penicillin: Secondary | ICD-10-CM | POA: Insufficient documentation

## 2013-07-25 DIAGNOSIS — G8929 Other chronic pain: Secondary | ICD-10-CM | POA: Insufficient documentation

## 2013-07-25 DIAGNOSIS — M25521 Pain in right elbow: Secondary | ICD-10-CM

## 2013-07-25 DIAGNOSIS — Z8701 Personal history of pneumonia (recurrent): Secondary | ICD-10-CM | POA: Insufficient documentation

## 2013-07-25 DIAGNOSIS — Z8781 Personal history of (healed) traumatic fracture: Secondary | ICD-10-CM | POA: Insufficient documentation

## 2013-07-25 MED ORDER — AZITHROMYCIN 250 MG PO TABS: 250.0000 mg | ORAL_TABLET | Freq: Every day | ORAL | Status: DC

## 2013-07-25 MED ORDER — PREDNISONE 20 MG PO TABS: 40.0000 mg | ORAL_TABLET | Freq: Every day | ORAL | Status: DC

## 2013-07-25 NOTE — Discharge Instructions (Signed)
Take Azithromycin as directed until gone. Take Prednisone as directed until gone. Refer to attached documents for more information.
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
## 2013-07-25 NOTE — ED Notes (Signed)
Pt taken to lobby to wait for Mariann Laster, case manager to bring him his antibiotics.

## 2013-07-25 NOTE — ED Notes (Signed)
He arrived by gems
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
## 2013-07-25 NOTE — Progress Notes (Signed)
ED CM consulted by [facility] RN on Pod B for medication assistance. Pt presented to [facility] ED with complaints of cough with a recent admission of bronchitis. Pt has had 12 ED visits with 3 hospitalizations.  Pt is chronically homeless. Several attempt has been made to assist  with establishing transitional housing but, patient never is never reachable. Pt has stated in the past that he rather live on the street. Pt  discharged from [facility] ED with  prescriptions patient stated he does not have the funds.  When asked about Medicare coverage he stated, he is not sure if he is still receiving Medicare. Brooke RN states patient needs  meds  for respiratory infection, or he will be back Discussed the plan to match with Jerene Pitch RN on [place] with patient,  Pt states he does not have Medicare  Patient is eligible for MATCH.  Patient was [facility] ED CM will assist with obtaining medication from [facility]. Prescriptions faxed to [facility] fax confirmation received. Medications were picked up from [facility] and hand to patient with instruction on administration. Pt verbalized understanding teach back done and verbalized appreciation. Discussed the importance of PCP patient verbalized understanding. Offered assistance with finding a PCP. Pt declined. Discussed the discharge plan with Jerene Pitch she is agreeable. No further ED CM needs identified.

## 2013-07-25 NOTE — ED Notes (Signed)
Pt waiting outside in wheelchair. Case management is checking to see about getting him the prescribed antibiotics.

## 2013-07-25 NOTE — ED Notes (Signed)
Pt reports he can't pay for his medication. Contacted case management to see if they can help him pay for his antibiotic.

## 2013-07-26 NOTE — ED Provider Notes (Signed)
Medical screening examination/treatment/procedure(s) were performed by non-physician practitioner and as supervising physician I was immediately available for consultation/collaboration.   Leota Jacobsen, MD 07/26/13 [phone]

## 2013-10-14 ENCOUNTER — Emergency Department (HOSPITAL_COMMUNITY): Payer: Medicare Other

## 2013-10-14 ENCOUNTER — Emergency Department (HOSPITAL_COMMUNITY)
Admission: EM | Admit: 2013-10-14 | Discharge: 2013-10-14 | Disposition: A | Discharge status: Home / Self Care | Payer: Medicare Other | Attending: Emergency Medicine | Admitting: Emergency Medicine

## 2013-10-14 ENCOUNTER — Encounter (HOSPITAL_COMMUNITY): Payer: Self-pay | Admitting: Emergency Medicine

## 2013-10-14 DIAGNOSIS — G8929 Other chronic pain: Secondary | ICD-10-CM | POA: Diagnosis not present

## 2013-10-14 DIAGNOSIS — I10 Essential (primary) hypertension: Secondary | ICD-10-CM | POA: Diagnosis not present

## 2013-10-14 DIAGNOSIS — Z5900 Homelessness unspecified: Secondary | ICD-10-CM | POA: Diagnosis not present

## 2013-10-14 DIAGNOSIS — Z8719 Personal history of other diseases of the digestive system: Secondary | ICD-10-CM | POA: Insufficient documentation

## 2013-10-14 DIAGNOSIS — J441 Chronic obstructive pulmonary disease with (acute) exacerbation: Secondary | ICD-10-CM | POA: Diagnosis not present

## 2013-10-14 DIAGNOSIS — Z8659 Personal history of other mental and behavioral disorders: Secondary | ICD-10-CM | POA: Diagnosis not present

## 2013-10-14 DIAGNOSIS — F17200 Nicotine dependence, unspecified, uncomplicated: Secondary | ICD-10-CM | POA: Insufficient documentation

## 2013-10-14 DIAGNOSIS — K5900 Constipation, unspecified: Secondary | ICD-10-CM | POA: Diagnosis not present

## 2013-10-14 DIAGNOSIS — R911 Solitary pulmonary nodule: Secondary | ICD-10-CM | POA: Insufficient documentation

## 2013-10-14 DIAGNOSIS — Z880 Allergy status to penicillin: Secondary | ICD-10-CM | POA: Diagnosis not present

## 2013-10-14 DIAGNOSIS — Z8701 Personal history of pneumonia (recurrent): Secondary | ICD-10-CM | POA: Diagnosis not present

## 2013-10-14 DIAGNOSIS — Z862 Personal history of diseases of the blood and blood-forming organs and certain disorders involving the immune mechanism: Secondary | ICD-10-CM | POA: Diagnosis not present

## 2013-10-14 DIAGNOSIS — R1032 Left lower quadrant pain: Secondary | ICD-10-CM

## 2013-10-14 DIAGNOSIS — Z8781 Personal history of (healed) traumatic fracture: Secondary | ICD-10-CM | POA: Diagnosis not present

## 2013-10-14 DIAGNOSIS — IMO0002 Reserved for concepts with insufficient information to code with codable children: Secondary | ICD-10-CM | POA: Diagnosis not present

## 2013-10-14 DIAGNOSIS — E119 Type 2 diabetes mellitus without complications: Secondary | ICD-10-CM | POA: Diagnosis not present

## 2013-10-14 DIAGNOSIS — Z792 Long term (current) use of antibiotics: Secondary | ICD-10-CM | POA: Insufficient documentation

## 2013-10-14 LAB — COMPREHENSIVE METABOLIC PANEL
ALT: 10 U/L (ref 0–53)
ANION GAP: 13 (ref 5–15)
AST: 17 U/L (ref 0–37)
Albumin: 3.5 g/dL (ref 3.5–5.2)
Alkaline Phosphatase: 171 U/L — ABNORMAL HIGH (ref 39–117)
BUN: 16 mg/dL (ref 6–23)
CALCIUM: 8.9 mg/dL (ref 8.4–10.5)
CO2: 22 mEq/L (ref 19–32)
Chloride: 102 mEq/L (ref 96–112)
Creatinine, Ser: 1.03 mg/dL (ref 0.50–1.35)
GFR calc Af Amer: 76 mL/min — ABNORMAL LOW (ref >90)
GFR calc non Af Amer: 66 mL/min — ABNORMAL LOW (ref >90)
Glucose, Bld: 86 mg/dL (ref 70–99)
Potassium: 4.5 mEq/L (ref 3.7–5.3)
SODIUM: 137 meq/L (ref 137–147)
TOTAL PROTEIN: 7.6 g/dL (ref 6.0–8.3)
Total Bilirubin: 0.3 mg/dL (ref 0.3–1.2)

## 2013-10-14 LAB — CBC WITH DIFFERENTIAL/PLATELET
BASOS PCT: 1 % (ref 0–1)
Basophils Absolute: 0 10*3/uL (ref 0.0–0.1)
EOS ABS: 0.3 10*3/uL (ref 0.0–0.7)
Eosinophils Relative: 4 % (ref 0–5)
HCT: 35.7 % — ABNORMAL LOW (ref 39.0–52.0)
Hemoglobin: 11.4 g/dL — ABNORMAL LOW (ref 13.0–17.0)
Lymphocytes Relative: 21 % (ref 12–46)
Lymphs Abs: 1.5 10*3/uL (ref 0.7–4.0)
MCH: 25.7 pg — AB (ref 26.0–34.0)
MCHC: 31.9 g/dL (ref 30.0–36.0)
MCV: 80.4 fL (ref 78.0–100.0)
MONOS PCT: 12 % (ref 3–12)
Monocytes Absolute: 0.8 10*3/uL (ref 0.1–1.0)
Neutro Abs: 4.6 10*3/uL (ref 1.7–7.7)
Neutrophils Relative %: 62 % (ref 43–77)
PLATELETS: 295 10*3/uL (ref 150–400)
RBC: 4.44 MIL/uL (ref 4.22–5.81)
RDW: 17 % — ABNORMAL HIGH (ref 11.5–15.5)
WBC: 7.2 10*3/uL (ref 4.0–10.5)

## 2013-10-14 LAB — URINALYSIS, ROUTINE W REFLEX MICROSCOPIC
Bilirubin Urine: NEGATIVE
Glucose, UA: NEGATIVE mg/dL
Hgb urine dipstick: NEGATIVE
Ketones, ur: NEGATIVE mg/dL
Leukocytes, UA: NEGATIVE
NITRITE: NEGATIVE
Protein, ur: NEGATIVE mg/dL
SPECIFIC GRAVITY, URINE: 1.012 (ref 1.005–1.030)
UROBILINOGEN UA: 1 mg/dL (ref 0.0–1.0)
pH: 5.5 (ref 5.0–8.0)

## 2013-10-14 LAB — I-STAT TROPONIN, ED: Troponin i, poc: 0 ng/mL (ref 0.00–0.08)

## 2013-10-14 LAB — LACTIC ACID, PLASMA: LACTIC ACID, VENOUS: 0.9 mmol/L (ref 0.5–2.2)

## 2013-10-14 LAB — LIPASE, BLOOD: LIPASE: 33 U/L (ref 11–59)

## 2013-10-14 MED ORDER — POLYETHYLENE GLYCOL 3350 17 G PO PACK: 17.0000 g | PACK | Freq: Two times a day (BID) | ORAL | Status: AC

## 2013-10-14 MED ORDER — IOHEXOL 300 MG/ML  SOLN
80.0000 mL | Freq: Once | INTRAMUSCULAR | Status: AC | PRN
  Administered 2013-10-14: 80 mL via INTRAVENOUS

## 2013-10-14 MED ORDER — IOHEXOL 300 MG/ML  SOLN
25.0000 mL | INTRAMUSCULAR | Status: AC
  Administered 2013-10-14: 25 mL via ORAL
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
## 2013-10-14 NOTE — ED Notes (Signed)
Pt remains monitored by blood pressure, pulse ox, and 5 lead.  

## 2013-10-14 NOTE — ED Notes (Signed)
Patient reminded that urine sample is needed.  

## 2013-10-14 NOTE — ED Notes (Signed)
Per EMS: c/o LLQ abd pain. Going on for a month, cough and rhonchi, cough going on for a year. Ambulatory. VS: 178/82, 70, 16.

## 2013-10-14 NOTE — ED Notes (Signed)
Patient transported to radiology for xray

## 2013-10-14 NOTE — ED Notes (Signed)
Pt not wanting to get into a gown stating "I don't like to be cold"; gave more blankets given but pt still not wanting to get into gown; placed pt on monitor, continuous pulse oximetry and blood pressure cuff; Richardson Landry, RN present in room

## 2013-10-14 NOTE — ED Provider Notes (Signed)
CSN: [id number]     Arrival date & time 10/14/13  2706 History   First MD Initiated Contact with Patient 10/14/13 [phone]     Chief Complaint  Patient presents with  . Abdominal Pain  . Cough    Patient is a 80 y.o. male presenting with abdominal pain. The history is provided by the patient.  Abdominal Pain Pain location:  LLQ Pain quality: stabbing   Pain radiates to:  Does not radiate Onset quality:  Gradual Duration: 1 month. Timing:  Constant Progression:  Unchanged Chronicity:  Chronic Context: not alcohol use, not suspicious food intake and not trauma   Relieved by:  Nothing Worsened by:  Palpation Ineffective treatments:  None tried Associated symptoms: cough and shortness of breath   Associated symptoms: no anorexia, no chest pain, no chills, no constipation, no diarrhea, no dysuria, no fever, no hematemesis, no hematochezia, no hematuria, no melena, no nausea, no sore throat and no vomiting    Had a colonoscopy 2 years ago with multiple polyps removed (tubulo/villous adenoma). Homeless.  Hx of COPD and current smoker.  Notes DOE worsening over past year.  Has chronic cough, productive white sputum, but has increased over last week. Sputum characteristics unchanged. No CP. No f/c. No hemoptysis or pleurisy.  No back pain.  No LE edema.   Last ECHO - Left ventricle: Distal septal and posterior basal hypokinesis The cavity size was normal. Wall thickness was increased in a pattern of mild LVH. Systolic function was normal. The estimated ejection fraction was in the range of 50% to 55%. Wall motion was normal; there were no regional wall motion abnormalities. - Aortic valve: Trivial regurgitation. - Left atrium: The atrium was mildly dilated. - Atrial septum: No defect or patent foramen ovale was identified. - Impressions: Basal short axis and subcostal images suggest thickening or a mass proximal to the pulmonary valve. May be off angle plane by tech but suggest MRI or  further echo imaging targeted to this area   Past Medical History  Diagnosis Date  . COPD (chronic obstructive pulmonary disease)   . Chronic back pain   . Pneumonia     01/2011 - CAP vs aspiration pneuonmia  . Depression   . PONV (postoperative nausea and vomiting)   . Hyponatremia     Previously felt secondary to SIADH  . Hypertension   . Upper GI bleed     01/2011 with EGD showing severe candida esophagitis and duodenal bulb erosion  . Anemia     In the setting of UGI bleed 01/2011 requiring blood transfusion  . Homelessness   . Dyspnea     Chronic, thought due to COPD. Echo 02/2010 with EF 30-35%, nuclear study negative, then repeat echo 03/2011 did not show systolic dysfxn, so unclear if truly HF  . Bronchitis   . Diabetes mellitus without complication   . Collar bone fracture     left   Past Surgical History  Procedure Laterality Date  . Tonsillectomy    . Vasectomy    . Appendectomy    . Tonsillectomy    . Colonoscopy    . Esophagogastroduodenoscopy    . Esophagogastroduodenoscopy  02/03/2011    Procedure: ESOPHAGOGASTRODUODENOSCOPY (EGD);  Surgeon: Juanita Craver, MD;  Location: [facility];  Service: Endoscopy;  Laterality: N/A;  . Esophagogastroduodenoscopy  02/03/2011    Procedure: ESOPHAGOGASTRODUODENOSCOPY (EGD);  Surgeon: Juanita Craver, MD;  Location: [facility];  Service: Endoscopy;  Laterality: N/A;  . Colonoscopy  01/30/2012  Procedure: COLONOSCOPY;  Surgeon: Ladene Artist, MD,FACG;  Location: [facility];  Service: Endoscopy;  Laterality: N/A;   Family History  Problem Relation Age of Onset  . Coronary artery disease Father     Starting in his 70's. Died of massive MI at age 52.  . Schizophrenia Brother   . Coronary artery disease Sister     MI at age 63  . Alzheimer's disease Mother    History  Substance Use Topics  . Smoking status: Current Some Day Smoker -- 1.00 packs/day for 60 years    Types: Cigarettes  . Smokeless tobacco: Never Used      Comment: Since age 64  . Alcohol Use: Yes     Comment: 174 ml of scotch weekly    Review of Systems  Constitutional: Negative for fever and chills.  HENT: Negative for congestion and sore throat.   Respiratory: Positive for cough and shortness of breath. Negative for chest tightness and wheezing.   Cardiovascular: Negative for chest pain.  Gastrointestinal: Positive for abdominal pain. Negative for nausea, vomiting, diarrhea, constipation, blood in stool, melena, hematochezia, abdominal distention, rectal pain, anorexia and hematemesis.  Genitourinary: Negative for dysuria, hematuria and testicular pain.  Musculoskeletal: Negative for back pain.  All other systems reviewed and are negative.   Allergies  Benadryl and Penicillins  Home Medications   Prior to Admission medications   Medication Sig Start Date End Date Taking? Authorizing Provider  azithromycin (ZITHROMAX Z-PAK) 250 MG tablet Take 1 tablet (250 mg total) by mouth daily. 500mg  PO day 1, then 250mg  PO days 205 07/25/13   Kaitlyn Szekalski, PA-C  predniSONE (DELTASONE) 20 MG tablet Take 2 tablets (40 mg total) by mouth daily. 07/25/13   Kaitlyn Szekalski, PA-C   BP 186/90  Pulse 79  Temp(Src) 98.8 F (37.1 C) (Oral)  Resp 18  Wt 138 lb (62.596 kg)  SpO2 98% Physical Exam  Nursing note and vitals reviewed. Constitutional: He is oriented to person, place, and time. He appears well-developed and well-nourished. No distress.  Unkempt  HENT:  Head: Normocephalic and atraumatic.  Nose: Nose normal.  Mouth/Throat: No oropharyngeal exudate.  Eyes: Conjunctivae are normal. Pupils are equal, round, and reactive to light.  Neck: Normal range of motion. Neck supple. No JVD present. No tracheal deviation present.  Cardiovascular: Normal rate, regular rhythm and normal heart sounds.   No murmur heard. Pulmonary/Chest: Effort normal. No respiratory distress.  Scattered rare exp wheeze and rhonchi throughout  Abdominal:  Soft. Bowel sounds are normal. He exhibits no distension and no mass. There is tenderness (LLQ). There is no rebound and no guarding.  Neg CVAT  Musculoskeletal: Normal range of motion. He exhibits no edema.  No edema, erythema, calf TTP or palpable cords  Neurological: He is alert and oriented to person, place, and time.  Normal strength and sensation  Skin: Skin is warm and dry. No rash noted.  Psychiatric: He has a normal mood and affect.    ED Course  Procedures (including critical care time) Labs Review Labs Reviewed  CBC WITH DIFFERENTIAL - Abnormal; Notable for the following:    Hemoglobin 11.4 (*)    HCT 35.7 (*)    MCH 25.7 (*)    RDW 17.0 (*)    All other components within normal limits  COMPREHENSIVE METABOLIC PANEL - Abnormal; Notable for the following:    Alkaline Phosphatase 171 (*)    GFR calc non Af Amer 66 (*)    GFR calc Af  Amer 25 (*)    All other components within normal limits  URINALYSIS, ROUTINE W REFLEX MICROSCOPIC  LIPASE, BLOOD  LACTIC ACID, PLASMA  I-STAT TROPOININ, ED    Imaging Review Dg Chest 2 View  10/14/2013   CLINICAL DATA:  Shortness of breath.  Cough.  EXAM: CHEST  2 VIEW  COMPARISON:  07/25/2013, 07/11/2013.  FINDINGS: Mediastinum and hilar structures are normal. Heart size normal. Pulmonary vascularity normal. Diffuse mild interstitial prominence is present. This is unchanged from multiple prior exams is most consistent chronic interstitial lung disease. No pleural effusion or pneumothorax. Old bilateral rib fractures are present. Diffuse osteopenia and degenerative change present.  IMPRESSION: 1. Diffuse pulmonary interstitial lung disease. 2. No acute cardiopulmonary disease.   Electronically Signed   By: Marcello Moores  Register   On: 10/14/2013 09:48   Ct Abdomen Pelvis W Contrast  10/14/2013   CLINICAL DATA:  One month history of left lower quadrant abdominal pain. Chronic cough. Surgical history includes appendectomy.  EXAM: CT ABDOMEN AND PELVIS  WITH CONTRAST  TECHNIQUE: Multidetector CT imaging of the abdomen and pelvis was performed using the standard protocol following bolus administration of intravenous contrast.  CONTRAST:  63mL OMNIPAQUE IOHEXOL 300 MG/ML IV. Oral contrast was also administered.  COMPARISON:  11/30/2012.  FINDINGS: Mild intra and extrahepatic biliary ductal dilation, the common hepatic duct measuring up to approximately 11 mm diameter. No obstructing bile duct stone or mass. Cholelithiasis without CT evidence of acute cholecystitis. No focal hepatic parenchymal masses. Normal-appearing spleen, pancreas, and adrenal glands. Cortical cysts involving both kidneys with mild diffuse cortical thinning involving both kidneys; no solid renal masses.  Severe abdominal aortic atherosclerosis with a large noncalcified plaque along the right lateral wall of the suprarenal abdominal aorta. Infrarenal abdominal aortic aneurysm with maximum measurements approximating 3.7 x 3.9 cm, slightly increased in size since the prior CT. The aneurysm extends over an approximate 4.5 cm length. Severe iliofemoral atherosclerosis without aneurysm. Visceral arteries patent though atherosclerotic. No significant lymphadenopathy in the abdomen or pelvis.  Small hiatal hernia, unchanged. Marked thickening/edema involving the wall of the distal esophagus, unchanged. Normal-appearing small bowel. Very large stool burden involving the cecum, ascending colon and transverse colon. Sigmoid colon diverticulosis without evidence acute diverticulitis. No ascites.  Moderate to marked prostate gland enlargement, unchanged. Normal seminal vesicles. Urinary bladder normal in appearance. Small bilateral inguinal hernias containing fat, unchanged.  Bone window images demonstrate multiple right lower rib fractures with nonunion, lower thoracic spondylosis, a prior compression fracture of the upper endplate of L2 on the order of 60-70% (unchanged), osseous demineralization, and facet  degenerative changes throughout the lumbar spine. Visualized lung bases demonstrate peripheral interstitial fibrosis with honeycombing involving the lower lobes, right greater than left. Heart enlarged with left ventricular predominance. New approximate 1.8 x 1.4 cm nodule involving the medial right lower lobe.  IMPRESSION: 1. No acute abnormalities involving the abdomen or pelvis. 2. New approximate 1.8 x 1.4 cm nodule involving the medial right lower lobe. A nonemergent CT of the chest with contrast may be helpful in further evaluation to exclude nodules elsewhere. Given the patient's age and current history of diabetes, though should not be performed for at least 24 hr in order to allow the intravenous contrast administered for this examination to clear. 3. Cholelithiasis without evidence of acute cholecystitis. 4. Mild intra and extrahepatic biliary ductal dilation, not significantly changed since the CT 11/2012. No obstructing bile duct stone or mass. 5. Sigmoid colon diverticulosis without evidence of acute diverticulitis.  6. Very large stool burden in the cecum, ascending colon and transverse colon. 7. Stable small hiatal hernia. Stable marked thickening/edema involving the wall of the distal esophagus, likely related to chronic GE reflux disease. 8. Stable moderate to marked prostate gland enlargement. 9. Stable small bilateral inguinal hernias containing fat. 10. Cardiomegaly. Interstitial fibrosis involving the visualized lower lobes, right greater than left.   Electronically Signed   By: Evangeline Dakin M.D.   On: 10/14/2013 12:56     EKG Interpretation   Date/Time:  Thursday October 14 2013 09:17:20 EDT Ventricular Rate:  72 PR Interval:  186 QRS Duration: 100 QT Interval:  453 QTC Calculation: 496 R Axis:   64 Text Interpretation:  Sinus rhythm Probable anteroseptal infarct, old Now  in sinus rhythm Confirmed by HORTON  MD, Loma Sousa ([id number]) on 10/14/2013  9:35:41 AM      MDM    Final diagnoses:  Constipation, unspecified constipation type  LLQ pain  Lung nodule   Pt with hx of COPD, diverticulosis presents with LLQ pain chronic for past 1 month, maybe longer. VSS, comfortable appearing.  Abd is soft, minimally TTP in LLQ.  No bleeding.  No f/c to indicate diverticulitis. BM regular, + flatus.  Review of chart demonstrates descending aortic aneurysm.  No signs of systemic toxicity or peritonitis to suggest surgical emergency.  Labs unremarkable. UA neg for infx or blood, proceed with contrast study.  CT demonstrates large stool burden indicative of constipation, no foci of obstruction. No diverticulitis.  A new lung nodule was found incidentally needing no emergent follow up with CT chest.  Pt was made aware of this result and need for f/u. Provided with PCP in resource handout.    Hx of COPD with chronic cough and progressive dyspnea on exertion. Increased cough for past few days. No signs of DVT, no  Hypoxia nor tachycardia, no hemoptysis or syncope, doubt PE, and chronic lung disease as primary cause far more likely.   Pt was noted to be hypertensive but no evidence of end organ damage (good UOP, no CP/ CXR without edema, no headache or neuro symptoms). Review of records shows pt is near baseline BP. Likely chronic, not treated as outpatient, deferred treatment in ED as I do not want to lower cerebral perfusion pressures. At discharge vitals were:  Filed Vitals:   10/14/13 1237  BP: 155/76  Pulse: 67  Temp:   Resp: 16      Tammy Sours, MD 10/14/13 1315

## 2013-10-14 NOTE — Discharge Instructions (Signed)
Incidentally, a nodule was seen of lung during abdominal imaging.  You will need a non emergent CT of chest to better characterize this nodule.    Constipation Constipation is when a person has fewer than three bowel movements a week, has difficulty having a bowel movement, or has stools that are dry, hard, or larger than normal. As people grow older, constipation is more common. If you try to fix constipation with medicines that make you have a bowel movement (laxatives), the problem may get worse. Long-term laxative use may cause the muscles of the colon to become weak. A low-fiber diet, not taking in enough fluids, and taking certain medicines may make constipation worse.  CAUSES   Certain medicines, such as antidepressants, pain medicine, iron supplements, antacids, and water pills.   Certain diseases, such as diabetes, irritable bowel syndrome (IBS), thyroid disease, or depression.   Not drinking enough water.   Not eating enough fiber-rich foods.   Stress or travel.   Lack of physical activity or exercise.   Ignoring the urge to have a bowel movement.   Using laxatives too much.  SIGNS AND SYMPTOMS   Having fewer than three bowel movements a week.   Straining to have a bowel movement.   Having stools that are hard, dry, or larger than normal.   Feeling full or bloated.   Pain in the lower abdomen.   Not feeling relief after having a bowel movement.  DIAGNOSIS  Your health care provider will take a medical history and perform a physical exam. Further testing may be done for severe constipation. Some tests may include:  A barium enema X-ray to examine your rectum, colon, and, sometimes, your small intestine.   A sigmoidoscopy to examine your lower colon.   A colonoscopy to examine your entire colon. TREATMENT  Treatment will depend on the severity of your constipation and what is causing it. Some dietary treatments include drinking more fluids and eating  more fiber-rich foods. Lifestyle treatments may include regular exercise. If these diet and lifestyle recommendations do not help, your health care provider may recommend taking over-the-counter laxative medicines to help you have bowel movements. Prescription medicines may be prescribed if over-the-counter medicines do not work.  HOME CARE INSTRUCTIONS   Eat foods that have a lot of fiber, such as fruits, vegetables, whole grains, and beans.  Limit foods high in fat and processed sugars, such as french fries, hamburgers, cookies, candies, and soda.   A fiber supplement may be added to your diet if you cannot get enough fiber from foods.   Drink enough fluids to keep your urine clear or pale yellow.   Exercise regularly or as directed by your health care provider.   Go to the restroom when you have the urge to go. Do not hold it.   Only take over-the-counter or prescription medicines as directed by your health care provider. Do not take other medicines for constipation without talking to your health care provider first.  [place] IF:   You have bright red blood in your stool.   Your constipation lasts for more than 4 days or gets worse.   You have abdominal or rectal pain.   You have thin, pencil-like stools.   You have unexplained weight loss. MAKE SURE YOU:   Understand these instructions.  Will watch your condition.  Will get help right away if you are not doing well or get worse. Document Released: 10/27/2003 Document Revised: 02/02/2013 Document Reviewed: 11/09/2012  ExitCare Patient Information 2015 [place]. This information is not intended to replace advice given to you by your health care provider. Make sure you discuss any questions you have with your health care provider.   Emergency Department Resource Guide 1) Find a Doctor and Pay Out of Pocket Although you won't have to find out who is covered by your insurance plan, it is a  good idea to ask around and get recommendations. You will then need to call the office and see if the doctor you have chosen will accept you as a new patient and what types of options they offer for patients who are self-pay. Some doctors offer discounts or will set up payment plans for their patients who do not have insurance, but you will need to ask so you aren't surprised when you get to your appointment.  2) Contact Your Local Health Department Not all health departments have doctors that can see patients for sick visits, but many do, so it is worth a call to see if yours does. If you don't know where your local health department is, you can check in your phone book. The CDC also has a tool to help you locate your state's health department, and many state websites also have listings of all of their local health departments.  3) Find a [facility] If your illness is not likely to be very severe or complicated, you may want to try a walk in clinic. These are popping up all over the country in pharmacies, drugstores, and shopping centers. They're usually staffed by nurse practitioners or physician assistants that have been trained to treat common illnesses and complaints. They're usually fairly quick and inexpensive. However, if you have serious medical issues or chronic medical problems, these are probably not your best option.  No Primary Care Doctor: - Call Health Connect at  [phone] - they can help you locate a primary care doctor that  accepts your insurance, provides certain services, etc. - Physician Referral Service- [phone]  Chronic Pain Problems: Organization         Address  Phone   Notes  [facility]  [phone] Patients need to be referred by their primary care doctor.   Medication Assistance: Organization         Address  Phone   Notes  [facility] Medication [facility] [address] [place], [place] [address] [phone] --Must be a resident of [facility] -- Must have NO insurance coverage whatsoever (no Medicaid/ Medicare, etc.) -- The pt. MUST have a primary care doctor that directs their care regularly and follows them in the community   [facility]  [phone]   [employer]  [phone]    Agencies that provide inexpensive medical care: Organization         Address  Phone   Notes  [place]  [phone]   [facility]    [phone]   [facility] [place], [place] [address] [phone]   [place] [address], [place] [phone]   [facility]    [phone]   [facility]    [phone]   [place] [address], [place] Phone:  [phone], Fax:  [phone] Hours of Operation:  9 am - 6 pm, M-F.  Also accepts Medicaid/Medicare and self-pay.  [place]  [facility] [place] [address], [address], [place] Phone: [phone], Fax: [phone]. Hours of Operation:  8:30 am - 5:30 pm, M-F.  Also accepts Medicaid and self-pay.  [facility] [place] [address], [place] Phone: [phone]   [place], [place], [place] [phone], Ext. [phone] Mondays & Thursdays: 7-9 AM.  First 15 patients are seen on a first come, first serve basis.    [place] Providers:  Organization         Address  Phone   Notes  [facility] [address], [place] [phone] Also accepts self-pay patients.  [facility] [address], [place]  [phone]   [place], [address], [place] [phone]   [facility] [address] [place] [phone]   Lucianne Lei [address], [place]   [phone] Only accepts [place] patients after they have their name applied to their card.   Self-Pay (no insurance) in [facility]:  Organization         Address  Phone   Notes  Sickle Cell Patients, [facility] [place] [phone]   [facility] [place] [phone]   [facility] [place]  [place], [place], [place] [phone]   [facility]/Dr. Osei-Bonsu  [address] [place] or [address], [place] [phone] Phone number for both [place] and [place] locations is the same.  [facility] [address], [place] [phone]   [facility] [address] [place] or [address] [phone]   [facility] [address] [place] [phone], phone; [phone], fax Sees patients 1st and 3rd Saturday of every month.  Must not qualify for public or private insurance (i.e. Medicaid, Medicare, [place] Health Choice, Veterans' Benefits)  Household income should be no more than 200% of the poverty level The clinic cannot treat you if you are pregnant or think you are pregnant  Sexually transmitted diseases are not treated at the clinic.

## 2013-10-14 NOTE — ED Notes (Signed)
Xray called to pick up patient.  Patient is currently drinking oral contrast for CT

## 2013-10-14 NOTE — ED Notes (Signed)
Patient advised that a urine sample is needed

## 2013-10-14 NOTE — ED Provider Notes (Signed)
I saw and evaluated the patient, reviewed the resident's note and I agree with the findings and plan.   EKG Interpretation   Date/Time:  Thursday October 14 2013 09:17:20 EDT Ventricular Rate:  72 PR Interval:  186 QRS Duration: 100 QT Interval:  453 QTC Calculation: 496 R Axis:   64 Text Interpretation:  Sinus rhythm Probable anteroseptal infarct, old Now  in sinus rhythm Confirmed by HORTON  MD, Loma Sousa ([id number]) on 10/14/2013  9:35:41 AM      See additional noted for details.  Merryl Hacker, MD 10/14/13 [phone]

## 2013-10-14 NOTE — ED Notes (Signed)
Pt aware of need of urine specimen; pt handed urinal to use when he can; Horton, MD present in room

## 2013-10-14 NOTE — ED Notes (Signed)
Patient returned to room, monitor reapplied

## 2013-10-14 NOTE — ED Notes (Signed)
Pt changing into a gown at this time; pt given 2 warm blankets

## 2013-10-14 NOTE — ED Notes (Addendum)
Dr. Henry Russel at bedside

## 2013-10-26 ENCOUNTER — Telehealth: Payer: Self-pay | Admitting: General Practice

## 2013-10-26 NOTE — Telephone Encounter (Signed)
Based on our last visit with pt on 02-17-2012

## 2013-10-26 NOTE — Telephone Encounter (Signed)
Mr. Foti was in [facility] back in 02-17-2012, which was the last time pt was seen by our provider, Dr. Blanchie Serve.  Mr. Preast was a New admit post hopitalization 01-28-12 - 01-31-12 to the facility.  The last progress note was 02-17-2012.   See last process notes scanned....cdavis

## 2013-11-10 ENCOUNTER — Encounter: Payer: Self-pay | Admitting: Gastroenterology

## 2014-01-09 ENCOUNTER — Other Ambulatory Visit: Payer: Self-pay

## 2014-01-09 ENCOUNTER — Encounter (HOSPITAL_COMMUNITY): Payer: Self-pay | Admitting: Emergency Medicine

## 2014-01-09 ENCOUNTER — Emergency Department (HOSPITAL_COMMUNITY): Payer: Medicare Other

## 2014-01-09 ENCOUNTER — Inpatient Hospital Stay (HOSPITAL_COMMUNITY)
Admission: AD | Admit: 2014-01-09 | Discharge: 2014-01-13 | DRG: 193 | Disposition: A | Discharge status: Home / Self Care | Payer: Medicare Other | Attending: Internal Medicine | Admitting: Internal Medicine

## 2014-01-09 DIAGNOSIS — Z720 Tobacco use: Secondary | ICD-10-CM | POA: Diagnosis present

## 2014-01-09 DIAGNOSIS — R05 Cough: Secondary | ICD-10-CM

## 2014-01-09 DIAGNOSIS — E119 Type 2 diabetes mellitus without complications: Secondary | ICD-10-CM

## 2014-01-09 DIAGNOSIS — J440 Chronic obstructive pulmonary disease with acute lower respiratory infection: Secondary | ICD-10-CM | POA: Diagnosis present

## 2014-01-09 DIAGNOSIS — J189 Pneumonia, unspecified organism: Secondary | ICD-10-CM | POA: Diagnosis not present

## 2014-01-09 DIAGNOSIS — F1020 Alcohol dependence, uncomplicated: Secondary | ICD-10-CM | POA: Diagnosis present

## 2014-01-09 DIAGNOSIS — F17210 Nicotine dependence, cigarettes, uncomplicated: Secondary | ICD-10-CM | POA: Diagnosis present

## 2014-01-09 DIAGNOSIS — K852 Alcohol induced acute pancreatitis without necrosis or infection: Secondary | ICD-10-CM | POA: Diagnosis present

## 2014-01-09 DIAGNOSIS — Z590 Homelessness: Secondary | ICD-10-CM

## 2014-01-09 DIAGNOSIS — K8020 Calculus of gallbladder without cholecystitis without obstruction: Secondary | ICD-10-CM | POA: Diagnosis present

## 2014-01-09 DIAGNOSIS — I5032 Chronic diastolic (congestive) heart failure: Secondary | ICD-10-CM | POA: Diagnosis present

## 2014-01-09 DIAGNOSIS — I509 Heart failure, unspecified: Secondary | ICD-10-CM

## 2014-01-09 DIAGNOSIS — I10 Essential (primary) hypertension: Secondary | ICD-10-CM | POA: Diagnosis present

## 2014-01-09 DIAGNOSIS — R059 Cough, unspecified: Secondary | ICD-10-CM

## 2014-01-09 DIAGNOSIS — R1084 Generalized abdominal pain: Secondary | ICD-10-CM | POA: Insufficient documentation

## 2014-01-09 DIAGNOSIS — K859 Acute pancreatitis without necrosis or infection, unspecified: Secondary | ICD-10-CM

## 2014-01-09 DIAGNOSIS — R109 Unspecified abdominal pain: Secondary | ICD-10-CM

## 2014-01-09 DIAGNOSIS — F329 Major depressive disorder, single episode, unspecified: Secondary | ICD-10-CM | POA: Diagnosis present

## 2014-01-09 DIAGNOSIS — J449 Chronic obstructive pulmonary disease, unspecified: Secondary | ICD-10-CM | POA: Diagnosis present

## 2014-01-09 DIAGNOSIS — I714 Abdominal aortic aneurysm, without rupture: Secondary | ICD-10-CM | POA: Diagnosis present

## 2014-01-09 LAB — URINALYSIS, ROUTINE W REFLEX MICROSCOPIC
BILIRUBIN URINE: NEGATIVE
Glucose, UA: NEGATIVE mg/dL
HGB URINE DIPSTICK: NEGATIVE
Ketones, ur: NEGATIVE mg/dL
Leukocytes, UA: NEGATIVE
Nitrite: NEGATIVE
PH: 6 (ref 5.0–8.0)
Protein, ur: NEGATIVE mg/dL
SPECIFIC GRAVITY, URINE: 1.019 (ref 1.005–1.030)
UROBILINOGEN UA: 1 mg/dL (ref 0.0–1.0)

## 2014-01-09 LAB — CBC WITH DIFFERENTIAL/PLATELET
Basophils Absolute: 0.1 10*3/uL (ref 0.0–0.1)
Basophils Relative: 1 % (ref 0–1)
Eosinophils Absolute: 0.4 10*3/uL (ref 0.0–0.7)
Eosinophils Relative: 4 % (ref 0–5)
HEMATOCRIT: 35 % — AB (ref 39.0–52.0)
HEMOGLOBIN: 11.1 g/dL — AB (ref 13.0–17.0)
Lymphocytes Relative: 23 % (ref 12–46)
Lymphs Abs: 1.9 10*3/uL (ref 0.7–4.0)
MCH: 26.7 pg (ref 26.0–34.0)
MCHC: 31.7 g/dL (ref 30.0–36.0)
MCV: 84.1 fL (ref 78.0–100.0)
MONO ABS: 0.6 10*3/uL (ref 0.1–1.0)
MONOS PCT: 7 % (ref 3–12)
NEUTROS ABS: 5.4 10*3/uL (ref 1.7–7.7)
Neutrophils Relative %: 65 % (ref 43–77)
Platelets: 258 10*3/uL (ref 150–400)
RBC: 4.16 MIL/uL — AB (ref 4.22–5.81)
RDW: 16.3 % — ABNORMAL HIGH (ref 11.5–15.5)
WBC: 8.3 10*3/uL (ref 4.0–10.5)

## 2014-01-09 LAB — COMPREHENSIVE METABOLIC PANEL
ALK PHOS: 137 U/L — AB (ref 39–117)
ALT: 10 U/L (ref 0–53)
ANION GAP: 12 (ref 5–15)
AST: 16 U/L (ref 0–37)
Albumin: 3.2 g/dL — ABNORMAL LOW (ref 3.5–5.2)
BUN: 21 mg/dL (ref 6–23)
CHLORIDE: 106 meq/L (ref 96–112)
CO2: 22 mEq/L (ref 19–32)
CREATININE: 0.83 mg/dL (ref 0.50–1.35)
Calcium: 8.9 mg/dL (ref 8.4–10.5)
GFR calc Af Amer: >90 mL/min (ref >90)
GFR calc non Af Amer: 80 mL/min — ABNORMAL LOW (ref >90)
Glucose, Bld: 91 mg/dL (ref 70–99)
Potassium: 4.5 mEq/L (ref 3.7–5.3)
Sodium: 140 mEq/L (ref 137–147)
Total Protein: 7 g/dL (ref 6.0–8.3)

## 2014-01-09 LAB — LIPASE, BLOOD: Lipase: 257 U/L — ABNORMAL HIGH (ref 11–59)

## 2014-01-09 MED ORDER — LEVOFLOXACIN IN D5W 750 MG/150ML IV SOLN
750.0000 mg | INTRAVENOUS | Status: DC
  Administered 2014-01-10: 750 mg via INTRAVENOUS
  Filled 2014-01-09: qty 150

## 2014-01-09 MED ORDER — LEVOFLOXACIN IN D5W 750 MG/150ML IV SOLN
750.0000 mg | Freq: Once | INTRAVENOUS | Status: AC
  Administered 2014-01-09: 750 mg via INTRAVENOUS
  Filled 2014-01-09: qty 150

## 2014-01-09 MED ORDER — SODIUM CHLORIDE 0.9 % IV BOLUS (SEPSIS)
1000.0000 mL | Freq: Once | INTRAVENOUS | Status: AC
  Administered 2014-01-09: 1000 mL via INTRAVENOUS

## 2014-01-09 MED ORDER — IOHEXOL 300 MG/ML  SOLN
50.0000 mL | Freq: Once | INTRAMUSCULAR | Status: AC | PRN
  Administered 2014-01-09: 50 mL via ORAL

## 2014-01-09 MED ORDER — IOHEXOL 300 MG/ML  SOLN
100.0000 mL | Freq: Once | INTRAMUSCULAR | Status: AC | PRN
  Administered 2014-01-09: 100 mL via INTRAVENOUS

## 2014-01-09 MED ORDER — VANCOMYCIN HCL IN DEXTROSE 750-5 MG/150ML-% IV SOLN
750.0000 mg | Freq: Two times a day (BID) | INTRAVENOUS | Status: DC
  Administered 2014-01-10 (×2): 750 mg via INTRAVENOUS
  Filled 2014-01-09 (×3): qty 150

## 2014-01-09 MED ORDER — ONDANSETRON HCL 4 MG/2ML IJ SOLN
4.0000 mg | Freq: Once | INTRAMUSCULAR | Status: AC
  Administered 2014-01-09: 4 mg via INTRAVENOUS
  Filled 2014-01-09: qty 2

## 2014-01-09 MED ORDER — VANCOMYCIN HCL IN DEXTROSE 1-5 GM/200ML-% IV SOLN
1000.0000 mg | Freq: Once | INTRAVENOUS | Status: AC
  Administered 2014-01-10: 1000 mg via INTRAVENOUS
  Filled 2014-01-09: qty 200

## 2014-01-09 MED ORDER — ACETAMINOPHEN 325 MG PO TABS
650.0000 mg | ORAL_TABLET | Freq: Once | ORAL | Status: AC
  Administered 2014-01-09: 650 mg via ORAL
  Filled 2014-01-09: qty 2

## 2014-01-09 NOTE — ED Notes (Signed)
Patient resting quietly with eyes closed. Appears in no respiratory distress. Resp: even, regular, unlabored.

## 2014-01-09 NOTE — ED Notes (Signed)
Pt knocked over his urinal, will get a urine sample when he has to go again

## 2014-01-09 NOTE — ED Notes (Signed)
Patient resting quietly with eyes closed. Appears in no respiratory distress. Respiratory is even, regular, and unlabored.

## 2014-01-09 NOTE — ED Notes (Addendum)
Per EMS-homeless with c/o of fatigue and  sore throat. Also c/o of nausea, vomiting. Hx of COPD. 4mg  Zofran given.

## 2014-01-09 NOTE — ED Provider Notes (Signed)
CSN: [id number]     Arrival date & time 01/09/14  1210 History   First MD Initiated Contact with Patient 01/09/14 1503     Chief Complaint  Patient presents with  . Homeless     (Consider location/radiation/quality/duration/timing/severity/associated sxs/prior Treatment) HPI Comments: Patient is an 80 year old male with history of COPD, depression, hyponatremia, hypertension, diabetes who presents to the ED today for fatigue. He reports that he has "had a cold" for a couple days. He is unsure if his symptoms are improving or worsening. He has associated cough and sore throat. He doesn't know if he has felt hot or cold. He has not taken anything to improve his symptoms because he does not have anything. The patient urinated on the floor because he did not feel like getting up. He has also had nausea and vomiting. He drinks "when he has the money to". He initially reports drinking yesterday, but then states he hasn't drank in a week. He does not drink every day. He generally drinks a pint of vodka once a week.   The history is provided by the patient. No language interpreter was used.    Past Medical History  Diagnosis Date  . COPD (chronic obstructive pulmonary disease)   . Chronic back pain   . Pneumonia     01/2011 - CAP vs aspiration pneuonmia  . Depression   . PONV (postoperative nausea and vomiting)   . Hyponatremia     Previously felt secondary to SIADH  . Hypertension   . Upper GI bleed     01/2011 with EGD showing severe candida esophagitis and duodenal bulb erosion  . Anemia     In the setting of UGI bleed 01/2011 requiring blood transfusion  . Homelessness   . Dyspnea     Chronic, thought due to COPD. Echo 02/2010 with EF 30-35%, nuclear study negative, then repeat echo 03/2011 did not show systolic dysfxn, so unclear if truly HF  . Bronchitis   . Diabetes mellitus without complication   . Collar bone fracture     left   Past Surgical History  Procedure Laterality Date   . Tonsillectomy    . Vasectomy    . Appendectomy    . Tonsillectomy    . Colonoscopy    . Esophagogastroduodenoscopy    . Esophagogastroduodenoscopy  02/03/2011    Procedure: ESOPHAGOGASTRODUODENOSCOPY (EGD);  Surgeon: Juanita Craver, MD;  Location: [facility];  Service: Endoscopy;  Laterality: N/A;  . Esophagogastroduodenoscopy  02/03/2011    Procedure: ESOPHAGOGASTRODUODENOSCOPY (EGD);  Surgeon: Juanita Craver, MD;  Location: [facility];  Service: Endoscopy;  Laterality: N/A;  . Colonoscopy  01/30/2012    Procedure: COLONOSCOPY;  Surgeon: Ladene Artist, MD,FACG;  Location: [facility];  Service: Endoscopy;  Laterality: N/A;   Family History  Problem Relation Age of Onset  . Coronary artery disease Father     Starting in his 77's. Died of massive MI at age 33.  . Schizophrenia Brother   . Coronary artery disease Sister     MI at age 6  . Alzheimer's disease Mother    History  Substance Use Topics  . Smoking status: Current Some Day Smoker -- 1.00 packs/day for 60 years    Types: Cigarettes  . Smokeless tobacco: Never Used     Comment: Since age 22  . Alcohol Use: Yes     Comment: 174 ml of scotch weekly    Review of Systems  Constitutional: Positive for fatigue. Negative for fever  and chills.  HENT: Positive for sore throat.   Respiratory: Positive for cough.   Cardiovascular: Negative for chest pain.  Gastrointestinal: Positive for nausea, vomiting and abdominal pain.  Genitourinary: Negative for dysuria.  All other systems reviewed and are negative.     Allergies  Benadryl and Penicillins  Home Medications   Prior to Admission medications   Not on File   BP 161/73 mmHg  Pulse 72  Temp(Src) 98.4 F (36.9 C) (Oral)  Resp 24  SpO2 99% Physical Exam  Constitutional: He is oriented to person, place, and time. He appears well-developed and well-nourished. No distress.  disheveled   HENT:  Head: Normocephalic and atraumatic.  Right Ear: Tympanic  membrane, external ear and ear canal normal.  Left Ear: Tympanic membrane, external ear and ear canal normal.  Nose: Nose normal.  Mouth/Throat: Uvula is midline, oropharynx is clear and moist and mucous membranes are normal.  Eyes: Conjunctivae are normal.  Neck: Normal range of motion. No tracheal deviation present.  Cardiovascular: Normal rate, regular rhythm and normal heart sounds.   Pulmonary/Chest: Effort normal. No stridor. He has wheezes. He has rales.  Abdominal: Soft. He exhibits no distension. There is tenderness in the epigastric area.  Musculoskeletal: Normal range of motion.  Neurological: He is alert and oriented to person, place, and time.  Skin: Skin is warm and dry. He is not diaphoretic.  Psychiatric: He has a normal mood and affect. His behavior is normal.  Nursing note and vitals reviewed.   ED Course  Procedures (including critical care time) Labs Review Labs Reviewed  CBC WITH DIFFERENTIAL - Abnormal; Notable for the following:    RBC 4.16 (*)    Hemoglobin 11.1 (*)    HCT 35.0 (*)    RDW 16.3 (*)    All other components within normal limits  COMPREHENSIVE METABOLIC PANEL - Abnormal; Notable for the following:    Albumin 3.2 (*)    Alkaline Phosphatase 137 (*)    Total Bilirubin <0.2 (*)    GFR calc non Af Amer 80 (*)    All other components within normal limits  LIPASE, BLOOD - Abnormal; Notable for the following:    Lipase 257 (*)    All other components within normal limits  URINE CULTURE  URINALYSIS, ROUTINE W REFLEX MICROSCOPIC  BASIC METABOLIC PANEL  CBC    Imaging Review Dg Chest 2 View  01/09/2014   CLINICAL DATA:  Cough  and congestion for 1 month, homeless  EXAM: CHEST  2 VIEW  COMPARISON:  10/14/2013  FINDINGS: Cardiomediastinal silhouette is stable. Again noted chronic interstitial prominence. No definite superimposed infiltrate or pulmonary edema. Mild degenerative changes thoracic spine.  IMPRESSION: Stable chronic interstitial  prominence. No definite superimposed infiltrate or pulmonary edema.   Electronically Signed   By: Lahoma Crocker M.D.   On: 01/09/2014 16:11   Ct Abdomen Pelvis W Contrast  01/09/2014   CLINICAL DATA:  Generalized abdominal pain.  EXAM: CT ABDOMEN AND PELVIS WITH CONTRAST  TECHNIQUE: Multidetector CT imaging of the abdomen and pelvis was performed using the standard protocol following bolus administration of intravenous contrast.  CONTRAST:  37mL OMNIPAQUE IOHEXOL 300 MG/ML SOLN, 192mL OMNIPAQUE IOHEXOL 300 MG/ML SOLN  COMPARISON:  CT scan of October 14, 2013.  FINDINGS: Stable compression deformity of L2 vertebral body consistent with old fracture. Slightly increased opacity is noted posteriorly in right lower lobe suggesting developing atelectasis or possibly pneumonia.  Mild cholelithiasis is noted without gallbladder wall thickening or  pericholecystic fluid. Stable mild intrahepatic and extrahepatic biliary dilatation is noted. No focal abnormality is noted in the liver, spleen or pancreas. Adrenal glands appear normal. No hydronephrosis or renal obstruction is noted. Stable left renal cyst is noted. Small sliding-type hiatal hernia is noted. 3.9 cm infrarenal abdominal aortic aneurysm is noted. Appendix appears normal. There is no evidence of bowel obstruction. No abnormal fluid collection is noted. Urinary bladder appears normal. Mild bilateral fat containing inguinal hernias are noted. Mild prostatic enlargement is noted. No significant adenopathy is noted.  IMPRESSION: Mild cholelithiasis is noted without evidence of cholecystitis.  Stable small sliding-type hiatal hernia.  3.9 cm infrarenal abdominal aortic aneurysm.  Stable mild bilateral fat containing inguinal hernias.  Stable mild intrahepatic and extrahepatic biliary dilatation.  No other significant abnormality seen in the abdomen or pelvis.   Electronically Signed   By: Sabino Dick M.D.   On: 01/09/2014 20:15     EKG Interpretation None       MDM   Final diagnoses:  Cough  Abdominal pain  Abdominal pain  CAP Elevated lipase  Patient presents to ED for evaluation of fatigue. He has felt as though he has had a cold over the past few days. Wheezing, cough, and vomiting. Epigastric tenderness with elevated lipase. CT abdomen/pelvis completed which shows mild cholelithiasis, hiatal hernia, 3.9 cm infrarenal abdominal aortic aneurysm, bilateral fat containing inguinal hernias. He was also found to have RLL infiltrate on CT scan. Will treat for CAP. Will admit to medicine for further evaluation. Patient is hemodynamically stable at this time. Dr. Doy Mince evaluated patient and agrees with plan. Patient / Family / Caregiver informed of clinical course, understand medical decision-making process, and agree with plan.    Elwyn Lade, PA-C 01/10/14 0153  Artis Delay, MD 01/11/14 [phone]

## 2014-01-09 NOTE — ED Notes (Signed)
Bed: WHALD Expected date:  Expected time:  Means of arrival:  Comments: 

## 2014-01-09 NOTE — ED Notes (Signed)
Bed: WA21 Expected date: 01/09/14 Expected time: 12:01 PM Means of arrival: Ambulance Comments: Flu like symptoms

## 2014-01-09 NOTE — Progress Notes (Signed)
ANTIBIOTIC CONSULT NOTE - INITIAL  Pharmacy Consult for Vancomycin and Levaquin Indication: Pneumonia, r/o sepsis  Allergies  Allergen Reactions  . Benadryl [Diphenhydramine Hcl] Other (See Comments)    Upset stomach   . Penicillins Hives and Itching   Patient Measurements: Height: 5\' 7"  (170.2 cm) Weight: 130 lb (58.968 kg) IBW/kg (Calculated) : 66.1  Vital Signs: Temp: 98.4 F (36.9 C) (11/29 1220) Temp Source: Oral (11/29 1220) BP: 156/74 mmHg (11/29 2048) Pulse Rate: 70 (11/29 2048) Intake/Output from previous day:   Intake/Output from this shift:    Labs:  Recent Labs  01/09/14 1558  WBC 8.3  HGB 11.1*  PLT 258  CREATININE 0.83   Estimated Creatinine Clearance: 57.3 mL/min (by C-G formula based on Cr of 0.83). No results for input(s): VANCOTROUGH, VANCOPEAK, VANCORANDOM, GENTTROUGH, GENTPEAK, GENTRANDOM, TOBRATROUGH, TOBRAPEAK, TOBRARND, AMIKACINPEAK, AMIKACINTROU, AMIKACIN in the last 72 hours.   Microbiology: No results found for this or any previous visit (from the past 720 hour(s)).  Medical History: Past Medical History  Diagnosis Date  . COPD (chronic obstructive pulmonary disease)   . Chronic back pain   . Pneumonia     01/2011 - CAP vs aspiration pneuonmia  . Depression   . PONV (postoperative nausea and vomiting)   . Hyponatremia     Previously felt secondary to SIADH  . Hypertension   . Upper GI bleed     01/2011 with EGD showing severe candida esophagitis and duodenal bulb erosion  . Anemia     In the setting of UGI bleed 01/2011 requiring blood transfusion  . Homelessness   . Dyspnea     Chronic, thought due to COPD. Echo 02/2010 with EF 30-35%, nuclear study negative, then repeat echo 03/2011 did not show systolic dysfxn, so unclear if truly HF  . Bronchitis   . Diabetes mellitus without complication   . Collar bone fracture     left    Medications:  Anti-infectives    Start     Dose/Rate Route Frequency Ordered Stop   01/10/14  2200  levofloxacin (LEVAQUIN) IVPB 750 mg     750 mg100 mL/hr over 90 Minutes Intravenous Every 24 hours 01/09/14 2255     01/10/14 1000  vancomycin (VANCOCIN) IVPB 750 mg/150 ml premix     750 mg150 mL/hr over 60 Minutes Intravenous Every 12 hours 01/09/14 2255     01/09/14 2230  levofloxacin (LEVAQUIN) IVPB 750 mg     750 mg100 mL/hr over 90 Minutes Intravenous  Once 01/09/14 2217     01/09/14 2230  vancomycin (VANCOCIN) IVPB 1000 mg/200 mL premix     1,000 mg200 mL/hr over 60 Minutes Intravenous  Once 01/09/14 2217       Assessment: 80yo homeless male c/o of fatigue, sore throat, nausea, and vomiting. Hx of COPD. Pharmacy is asked to dose Levaquin and Vanc for CAP/sepsis. First doses ordered to be given in the ED.  11/29 >> Levaquin >> 11/29 >> Vanc >>   Tmax: AF WBCs: wnl Renal: wnl, 57CG, 69N  Goal of Therapy:  Vancomycin trough level 15-20 mcg/ml  Appropriate antibiotic dosing for renal function; eradication of infection  Plan:  Vancomycin 1g x 1 then 750mg  IV q12h. Levaquin 750mg  IV q24h. Measure Vanc trough at steady state. Follow up renal fxn and culture results.  Romeo Rabon, PharmD, pager [phone]. 01/09/2014,10:58 PM.

## 2014-01-09 NOTE — ED Notes (Signed)
Assisted patient with wheelchair to the restroom and back to bed. Tolerated it well. Patient had urinated all over the bed. Performed patient hygiene and changed his sheets and gown. After getting back into bed, he was provided 2 warm blankets.

## 2014-01-10 DIAGNOSIS — K8020 Calculus of gallbladder without cholecystitis without obstruction: Secondary | ICD-10-CM | POA: Diagnosis present

## 2014-01-10 DIAGNOSIS — F1020 Alcohol dependence, uncomplicated: Secondary | ICD-10-CM | POA: Diagnosis present

## 2014-01-10 DIAGNOSIS — K852 Alcohol induced acute pancreatitis without necrosis or infection: Secondary | ICD-10-CM | POA: Diagnosis present

## 2014-01-10 DIAGNOSIS — J189 Pneumonia, unspecified organism: Secondary | ICD-10-CM | POA: Diagnosis present

## 2014-01-10 DIAGNOSIS — J440 Chronic obstructive pulmonary disease with acute lower respiratory infection: Secondary | ICD-10-CM | POA: Diagnosis present

## 2014-01-10 DIAGNOSIS — R1084 Generalized abdominal pain: Secondary | ICD-10-CM

## 2014-01-10 DIAGNOSIS — I714 Abdominal aortic aneurysm, without rupture: Secondary | ICD-10-CM | POA: Diagnosis present

## 2014-01-10 DIAGNOSIS — R05 Cough: Secondary | ICD-10-CM | POA: Diagnosis present

## 2014-01-10 DIAGNOSIS — E119 Type 2 diabetes mellitus without complications: Secondary | ICD-10-CM

## 2014-01-10 DIAGNOSIS — J42 Unspecified chronic bronchitis: Secondary | ICD-10-CM

## 2014-01-10 DIAGNOSIS — F329 Major depressive disorder, single episode, unspecified: Secondary | ICD-10-CM | POA: Diagnosis present

## 2014-01-10 DIAGNOSIS — I10 Essential (primary) hypertension: Secondary | ICD-10-CM | POA: Diagnosis present

## 2014-01-10 DIAGNOSIS — I5032 Chronic diastolic (congestive) heart failure: Secondary | ICD-10-CM | POA: Diagnosis present

## 2014-01-10 DIAGNOSIS — Z590 Homelessness: Secondary | ICD-10-CM | POA: Diagnosis not present

## 2014-01-10 DIAGNOSIS — F17210 Nicotine dependence, cigarettes, uncomplicated: Secondary | ICD-10-CM | POA: Diagnosis present

## 2014-01-10 LAB — BASIC METABOLIC PANEL
ANION GAP: 14 (ref 5–15)
BUN: 14 mg/dL (ref 6–23)
CALCIUM: 8.5 mg/dL (ref 8.4–10.5)
CO2: 21 meq/L (ref 19–32)
Chloride: 100 mEq/L (ref 96–112)
Creatinine, Ser: 0.85 mg/dL (ref 0.50–1.35)
GFR calc Af Amer: >90 mL/min (ref >90)
GFR calc non Af Amer: 79 mL/min — ABNORMAL LOW (ref >90)
Glucose, Bld: 117 mg/dL — ABNORMAL HIGH (ref 70–99)
POTASSIUM: 4.2 meq/L (ref 3.7–5.3)
SODIUM: 135 meq/L — AB (ref 137–147)

## 2014-01-10 LAB — CBC
HEMATOCRIT: 35.6 % — AB (ref 39.0–52.0)
Hemoglobin: 11 g/dL — ABNORMAL LOW (ref 13.0–17.0)
MCH: 26 pg (ref 26.0–34.0)
MCHC: 30.9 g/dL (ref 30.0–36.0)
MCV: 84.2 fL (ref 78.0–100.0)
Platelets: 256 10*3/uL (ref 150–400)
RBC: 4.23 MIL/uL (ref 4.22–5.81)
RDW: 16.3 % — AB (ref 11.5–15.5)
WBC: 7.7 10*3/uL (ref 4.0–10.5)

## 2014-01-10 MED ORDER — ACETAMINOPHEN 650 MG RE SUPP: 650.0000 mg | Freq: Four times a day (QID) | RECTAL | Status: DC | PRN

## 2014-01-10 MED ORDER — HYDROMORPHONE HCL 1 MG/ML IJ SOLN: 0.5000 mg | INTRAMUSCULAR | Status: DC | PRN

## 2014-01-10 MED ORDER — ADULT MULTIVITAMIN W/MINERALS CH
1.0000 | ORAL_TABLET | Freq: Every day | ORAL | Status: DC
  Administered 2014-01-10 – 2014-01-13 (×4): 1 via ORAL
  Filled 2014-01-10 (×4): qty 1

## 2014-01-10 MED ORDER — VITAMIN B-1 100 MG PO TABS
100.0000 mg | ORAL_TABLET | Freq: Every day | ORAL | Status: DC
  Administered 2014-01-10 – 2014-01-13 (×4): 100 mg via ORAL
  Filled 2014-01-10 (×4): qty 1

## 2014-01-10 MED ORDER — FOLIC ACID 1 MG PO TABS
1.0000 mg | ORAL_TABLET | Freq: Every day | ORAL | Status: DC
  Administered 2014-01-10 – 2014-01-13 (×4): 1 mg via ORAL
  Filled 2014-01-10 (×4): qty 1

## 2014-01-10 MED ORDER — LORAZEPAM 2 MG/ML IJ SOLN
1.0000 mg | Freq: Four times a day (QID) | INTRAMUSCULAR | Status: AC | PRN
  Administered 2014-01-10 – 2014-01-12 (×4): 1 mg via INTRAVENOUS
  Filled 2014-01-10 (×4): qty 1

## 2014-01-10 MED ORDER — ALUM & MAG HYDROXIDE-SIMETH 200-200-20 MG/5ML PO SUSP: 30.0000 mL | Freq: Four times a day (QID) | ORAL | Status: DC | PRN

## 2014-01-10 MED ORDER — LORAZEPAM 2 MG/ML IJ SOLN
0.0000 mg | Freq: Four times a day (QID) | INTRAMUSCULAR | Status: AC
  Administered 2014-01-10: 1 mg via INTRAVENOUS
  Administered 2014-01-11: 2 mg via INTRAVENOUS
  Administered 2014-01-11: 1 mg via INTRAVENOUS
  Administered 2014-01-11: 2 mg via INTRAVENOUS
  Administered 2014-01-11: 4 mg via INTRAVENOUS
  Filled 2014-01-10 (×3): qty 1
  Filled 2014-01-10: qty 2

## 2014-01-10 MED ORDER — ACETAMINOPHEN 325 MG PO TABS: 650.0000 mg | ORAL_TABLET | Freq: Four times a day (QID) | ORAL | Status: DC | PRN

## 2014-01-10 MED ORDER — SODIUM CHLORIDE 0.9 % IV SOLN
INTRAVENOUS | Status: DC
  Administered 2014-01-10: 19:00:00 via INTRAVENOUS

## 2014-01-10 MED ORDER — THIAMINE HCL 100 MG/ML IJ SOLN
100.0000 mg | Freq: Every day | INTRAMUSCULAR | Status: DC
  Filled 2014-01-10 (×4): qty 1

## 2014-01-10 MED ORDER — ALBUTEROL SULFATE (2.5 MG/3ML) 0.083% IN NEBU: 2.5000 mg | INHALATION_SOLUTION | Freq: Four times a day (QID) | RESPIRATORY_TRACT | Status: DC | PRN

## 2014-01-10 MED ORDER — ONDANSETRON HCL 4 MG PO TABS: 4.0000 mg | ORAL_TABLET | Freq: Four times a day (QID) | ORAL | Status: DC | PRN

## 2014-01-10 MED ORDER — ONDANSETRON HCL 4 MG/2ML IJ SOLN: 4.0000 mg | Freq: Four times a day (QID) | INTRAMUSCULAR | Status: DC | PRN

## 2014-01-10 MED ORDER — LEVOFLOXACIN IN D5W 500 MG/100ML IV SOLN: 500.0000 mg | INTRAVENOUS | Status: DC

## 2014-01-10 MED ORDER — LORAZEPAM 2 MG/ML IJ SOLN
0.0000 mg | Freq: Two times a day (BID) | INTRAMUSCULAR | Status: DC
  Administered 2014-01-12: 1 mg via INTRAVENOUS
  Filled 2014-01-10: qty 1

## 2014-01-10 MED ORDER — NICOTINE 14 MG/24HR TD PT24
14.0000 mg | MEDICATED_PATCH | Freq: Every day | TRANSDERMAL | Status: DC
  Administered 2014-01-11 – 2014-01-12 (×2): 14 mg via TRANSDERMAL
  Filled 2014-01-10 (×4): qty 1

## 2014-01-10 MED ORDER — LORAZEPAM 1 MG PO TABS
1.0000 mg | ORAL_TABLET | Freq: Four times a day (QID) | ORAL | Status: AC | PRN
  Administered 2014-01-11: 1 mg via ORAL
  Filled 2014-01-10: qty 1

## 2014-01-10 MED ORDER — OXYCODONE HCL 5 MG PO TABS: 5.0000 mg | ORAL_TABLET | ORAL | Status: DC | PRN

## 2014-01-10 NOTE — Plan of Care (Signed)
Problem: Phase I Progression Outcomes Goal: Dyspnea controlled at rest Outcome: Completed/Met Date Met:  01/10/14 Goal: Pain controlled with appropriate interventions Outcome: Completed/Met Date Met:  01/10/14 Goal: First antibiotic given within 6hrs of admit Outcome: Completed/Met Date Met:  01/10/14

## 2014-01-10 NOTE — H&P (Signed)
[facility] Admission History and Physical       Sandy Blouch TFT:[id number] DOB: Jan 02, 1930 DOA: 01/09/2014  Referring physician: EDP PCP: No PCP Per Patient  Specialists:   Chief Complaint: SOB and Cough  HPI: Dale Mcfarland is a 80 y.o. male with a history of COPD, CHF, DM2, HTN and ETOH Abuse who presents to the Ed with complaints of increased SOB and Increased coughing and chest congestion.  He reports a nonproductive cough worse this past week.   He denies any fevers or chills. He does report epigastric ABD pain.   He is homeless and reports that he drinks Alcohol whenever he can, and his last drink was 2 days ago, and when he drinks he will drink 1 pint daily.    He was evaluated and a Ct scan of the ABD pelvis revealed a RLL pneumonia and an incidental  finding of a 3.9 cm AAA.   He also had an elevated Lipase level of 257.   He was placed on IV Levaquin and he was referred for medical admission.     Review of Systems:  Constitutional: No Weight Loss, No Weight Gain, Night Sweats, Fevers, Chills, Dizziness, Fatigue, or Generalized Weakness HEENT: No Headaches, Difficulty Swallowing,Tooth/Dental Problems,Sore Throat,  No Sneezing, Rhinitis, Ear Ache, Nasal Congestion, or Post Nasal Drip,  Cardio-vascular:  No Chest pain, Orthopnea, PND, Edema in Lower Extremities, Anasarca, Dizziness, Palpitations  Resp: No +Dyspnea, No DOE,  +Non-Productive Cough, No Hemoptysis, +Wheezing.    GI: No Heartburn, Indigestion, Abdominal Pain, Nausea, Vomiting, Diarrhea, Hematemesis, Hematochezia, Melena, Change in Bowel Habits,  Loss of Appetite  GU: No Dysuria, Change in Color of Urine, No Urgency or Frequency, No Flank pain.  Musculoskeletal: No Joint Pain or Swelling, No Decreased Range of Motion, No Back Pain.  Neurologic: No Syncope, No Seizures, Muscle Weakness, Paresthesia, Vision Disturbance or Loss, No Diplopia, No Vertigo, No Difficulty Walking,  Skin: No Rash or Lesions. Psych: No  Change in Mood or Affect, No Depression or Anxiety, No Memory loss, No Confusion, or Hallucinations   Past Medical History  Diagnosis Date  . COPD (chronic obstructive pulmonary disease)   . Chronic back pain   . Pneumonia     01/2011 - CAP vs aspiration pneuonmia  . Depression   . PONV (postoperative nausea and vomiting)   . Hyponatremia     Previously felt secondary to SIADH  . Hypertension   . Upper GI bleed     01/2011 with EGD showing severe candida esophagitis and duodenal bulb erosion  . Anemia     In the setting of UGI bleed 01/2011 requiring blood transfusion  . Homelessness   . Dyspnea     Chronic, thought due to COPD. Echo 02/2010 with EF 30-35%, nuclear study negative, then repeat echo 03/2011 did not show systolic dysfxn, so unclear if truly HF  . Bronchitis   . Diabetes mellitus without complication   . Collar bone fracture     left      Past Surgical History  Procedure Laterality Date  . Tonsillectomy    . Vasectomy    . Appendectomy    . Tonsillectomy    . Colonoscopy    . Esophagogastroduodenoscopy    . Esophagogastroduodenoscopy  02/03/2011    Procedure: ESOPHAGOGASTRODUODENOSCOPY (EGD);  Surgeon: Juanita Craver, MD;  Location: [facility];  Service: Endoscopy;  Laterality: N/A;  . Esophagogastroduodenoscopy  02/03/2011    Procedure: ESOPHAGOGASTRODUODENOSCOPY (EGD);  Surgeon: Juanita Craver, MD;  Location: [facility];  Service: Endoscopy;  Laterality: N/A;  . Colonoscopy  01/30/2012    Procedure: COLONOSCOPY;  Surgeon: Ladene Artist, MD,FACG;  Location: [facility];  Service: Endoscopy;  Laterality: N/A;       Prior to Admission medications   Not on File      Allergies  Allergen Reactions  . Benadryl [Diphenhydramine Hcl] Other (See Comments)    Upset stomach   . Penicillins Hives and Itching     Social History:  reports that he has been smoking Cigarettes.  He has a 60 pack-year smoking history. He has never used smokeless tobacco. He  reports that he drinks alcohol. He reports that he does not use illicit drugs.     Family History  Problem Relation Age of Onset  . Coronary artery disease Father     Starting in his 56's. Died of massive MI at age 78.  . Schizophrenia Brother   . Coronary artery disease Sister     MI at age 43  . Alzheimer's disease Mother        Physical Exam:  GEN:  Pleasant Disheveled, ElderlyThin   80 y.o.  Caucasian male examined and in no acute distress; cooperative with exam Filed Vitals:   01/09/14 2048 01/09/14 2240 01/09/14 2310 01/09/14 2359  BP: 156/74   133/70  Pulse: 70   73  Temp:    98.9 F (37.2 C)  TempSrc:    Oral  Resp: 18   18  Height:  5\' 7"  (1.702 m) 5\' 7"  (1.702 m)   Weight:  58.968 kg (130 lb) 56.609 kg (124 lb 12.8 oz)   SpO2: 97%   94%   Blood pressure 133/70, pulse 73, temperature 98.9 F (37.2 C), temperature source Oral, resp. rate 18, height 5\' 7"  (1.702 m), weight 56.609 kg (124 lb 12.8 oz), SpO2 94 %. PSYCH: He is alert and oriented x4; does not appear anxious does not appear depressed; affect is normal HEENT: Normocephalic and Atraumatic, Mucous membranes pink; PERRLA; EOM intact; Fundi:  Benign;  No scleral icterus, Nares: Patent, Oropharynx: Clear, Sparse Dentition,    Neck:  FROM, No Cervical Lymphadenopathy nor Thyromegaly or Carotid Bruit; No JVD; Breasts:: Not examined CHEST WALL: No tenderness CHEST: Normal respiration, clear to auscultation bilaterally HEART: Regular rate and rhythm; no murmurs rubs or gallops BACK: No kyphosis or scoliosis; No CVA tenderness ABDOMEN: Positive Bowel Sounds, Scaphoid, Soft Non-Tender; No Masses, No Organomegaly Rectal Exam: Not done EXTREMITIES: No Cyanosis, Clubbing, or Edema; No Ulcerations. Genitalia: not examined PULSES: 2+ and symmetric SKIN: Normal hydration no rash or ulceration CNS:  Alert and Oriented x 4, No Focal Deficits Vascular: pulses palpable throughout    Labs on Admission:  Basic Metabolic  Panel:  Recent Labs Lab 01/09/14 1558  NA 140  K 4.5  CL 106  CO2 22  GLUCOSE 91  BUN 21  CREATININE 0.83  CALCIUM 8.9   Liver Function Tests:  Recent Labs Lab 01/09/14 1558  AST 16  ALT 10  ALKPHOS 137*  BILITOT <0.2*  PROT 7.0  ALBUMIN 3.2*    Recent Labs Lab 01/09/14 1558  LIPASE 257*   No results for input(s): AMMONIA in the last 168 hours. CBC:  Recent Labs Lab 01/09/14 1558  WBC 8.3  NEUTROABS 5.4  HGB 11.1*  HCT 35.0*  MCV 84.1  PLT 258   Cardiac Enzymes: No results for input(s): CKTOTAL, CKMB, CKMBINDEX, TROPONINI in the last 168 hours.  BNP (last 3 results)  Recent Labs  03/09/13 2320 06/15/13 1910 07/04/13 0845  PROBNP 675.5* 1138.0* 694.6*   CBG: No results for input(s): GLUCAP in the last 168 hours.  Radiological Exams on Admission: Dg Chest 2 View  01/09/2014   CLINICAL DATA:  Cough  and congestion for 1 month, homeless  EXAM: CHEST  2 VIEW  COMPARISON:  10/14/2013  FINDINGS: Cardiomediastinal silhouette is stable. Again noted chronic interstitial prominence. No definite superimposed infiltrate or pulmonary edema. Mild degenerative changes thoracic spine.  IMPRESSION: Stable chronic interstitial prominence. No definite superimposed infiltrate or pulmonary edema.   Electronically Signed   By: Lahoma Crocker M.D.   On: 01/09/2014 16:11   Ct Abdomen Pelvis W Contrast  01/09/2014   CLINICAL DATA:  Generalized abdominal pain.  EXAM: CT ABDOMEN AND PELVIS WITH CONTRAST  TECHNIQUE: Multidetector CT imaging of the abdomen and pelvis was performed using the standard protocol following bolus administration of intravenous contrast.  CONTRAST:  27mL OMNIPAQUE IOHEXOL 300 MG/ML SOLN, 123mL OMNIPAQUE IOHEXOL 300 MG/ML SOLN  COMPARISON:  CT scan of October 14, 2013.  FINDINGS: Stable compression deformity of L2 vertebral body consistent with old fracture. Slightly increased opacity is noted posteriorly in right lower lobe suggesting developing atelectasis  or possibly pneumonia.  Mild cholelithiasis is noted without gallbladder wall thickening or pericholecystic fluid. Stable mild intrahepatic and extrahepatic biliary dilatation is noted. No focal abnormality is noted in the liver, spleen or pancreas. Adrenal glands appear normal. No hydronephrosis or renal obstruction is noted. Stable left renal cyst is noted. Small sliding-type hiatal hernia is noted. 3.9 cm infrarenal abdominal aortic aneurysm is noted. Appendix appears normal. There is no evidence of bowel obstruction. No abnormal fluid collection is noted. Urinary bladder appears normal. Mild bilateral fat containing inguinal hernias are noted. Mild prostatic enlargement is noted. No significant adenopathy is noted.  IMPRESSION: Mild cholelithiasis is noted without evidence of cholecystitis.  Stable small sliding-type hiatal hernia.  3.9 cm infrarenal abdominal aortic aneurysm.  Stable mild bilateral fat containing inguinal hernias.  Stable mild intrahepatic and extrahepatic biliary dilatation.  No other significant abnormality seen in the abdomen or pelvis.   Electronically Signed   By: Sabino Dick M.D.   On: 01/09/2014 20:15      Assessment/Plan:   80 y.o. male with     1.   CAP (community acquired pneumonia   IV Levaquin   Albuterol Nebs   O2 PRN   Monitor O2 Sats    Active Problems:    2.   COPD (chronic obstructive pulmonary disease   Albuterol Nebs      3.   CHF (congestive heart failure)   Monitor for Signs and Sxs of Fluid Overload        4.   Diabetes mellitus without complication   SSI coverage PRN   Check HbA1C in AM     5.   Alcoholism   CIWA Protocol with IV Ativan      6.   Acute alcoholic pancreatitis   Clear liquids   Anti-Emetics PRN   Pain control PRN   Monitor Lipase levels      7.   Tobacco abuse   Nicotine Patch daily      8.   DVT Prophylaxis    SCDs      9.  Other- Homelessness   Social work consult      Code Status:    FULL CODE Family  Communication:    No Family Present Disposition Plan:     Inaptient  Time spent:  [facility] Pager [phone]   If [place] Please Contact the Day Rounding Team MD for [facility]  If 7PM-7AM, Please Contact Night-Floor Coverage  [email or web address] Password [email or web address] 01/10/2014, 12:20 AM

## 2014-01-10 NOTE — Progress Notes (Signed)
Patient admitted this morning, 80 year old male with COPD, CHF, diabetes, hypertension, and call abuse, homeless presented with increasing shortness of breath, chest congestion, epigastric abdominal pain. History of alcohol abuse.  BP 165/87 mmHg  Pulse 70  Temp(Src) 98.1 F (36.7 C) (Oral)  Resp 18  Ht 5\' 7"  (1.702 m)  Wt 56.609 kg (124 lb 12.8 oz)  BMI 19.54 kg/m2  SpO2 95%  Patient seen and examined Agree with assessment and plan  CAP: With underlying COPD - Continue albuterol nebs, IV antibiotics   acute pancreatitis - check lipase in a.m., continue IV fluid hydration, pain control - Check lipid panel, CT abdomen showed cholelithiasis with no evidence of cholecystitis  History of alcohol abuse Continue Michaelyn Barter M.D. [facility] 01/10/2014, 11:10 AM  Pager: [phone]

## 2014-01-10 NOTE — Progress Notes (Signed)
Clinical Social Work Department BRIEF PSYCHOSOCIAL ASSESSMENT 01/10/2014  Patient:  Dale Mcfarland, Dale Mcfarland     Account Number:  [id number]     Admit date:  01/09/2014  Clinical Social Worker:  Lacie Scotts  Date/Time:  01/03/2014 12:00 N  Referred by:  Physician  Date Referred:  01/09/2014 Referred for  Homelessness  Substance Abuse   Other Referral:   Interview type:  Patient Other interview type:    PSYCHOSOCIAL DATA Living Status:  OTHER Admitted from facility:   Level of care:   Primary support name:  Aeneas Longsworth Primary support relationship to patient:  CHILD, ADULT Degree of support available:   Pt declines to ask for assistance.    CURRENT CONCERNS Current Concerns  Other - See comment  Substance Abuse   Other Concerns:   Homelessness    SOCIAL WORK ASSESSMENT / PLAN Pt is an 80 yr old gentleman admitted to [facility] with a dx of CAP. CSW met with pt to assist with d/c planning needs. Pt states he is homeless by choice. He has utilized shelters in the past. He has a daughter local but does not want to live with her. Pt states he collects a social security check monthly and uses that for food and shelter. He will collect his check on 12/3. Pt states he is thinking about traveling to [place] for the winter though he has no definite plans in place at this time.  Pt states  he drinks from time to time. " I don't drink a lot. " Pt declined SBIRT screening. Pt declines SA resources . He does not feel his drinking is a problem.   Assessment/plan status:  Psychosocial Support/Ongoing Assessment of Needs Other assessment/ plan:   Information/referral to community resources:   Pt declines SA resources.    PATIENT'S/FAMILY'S RESPONSE TO PLAN OF CARE: D/C planning is ongoing. CSW will meet with pt again and offer assistance with shelter referral / bus passes . He will have social security funds at time of d/c . 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 He is pleasant and talkative. CSW will continue to follow to offer support and assistance with d/c planning  Werner Lean LCSW [phone].

## 2014-01-11 ENCOUNTER — Inpatient Hospital Stay (HOSPITAL_COMMUNITY): Payer: Medicare Other

## 2014-01-11 DIAGNOSIS — I5032 Chronic diastolic (congestive) heart failure: Secondary | ICD-10-CM

## 2014-01-11 DIAGNOSIS — Z720 Tobacco use: Secondary | ICD-10-CM

## 2014-01-11 DIAGNOSIS — R1084 Generalized abdominal pain: Secondary | ICD-10-CM | POA: Insufficient documentation

## 2014-01-11 LAB — COMPREHENSIVE METABOLIC PANEL
ALT: 9 U/L (ref 0–53)
AST: 13 U/L (ref 0–37)
Albumin: 3 g/dL — ABNORMAL LOW (ref 3.5–5.2)
Alkaline Phosphatase: 142 U/L — ABNORMAL HIGH (ref 39–117)
Anion gap: 13 (ref 5–15)
BUN: 11 mg/dL (ref 6–23)
CHLORIDE: 101 meq/L (ref 96–112)
CO2: 22 mEq/L (ref 19–32)
Calcium: 8.7 mg/dL (ref 8.4–10.5)
Creatinine, Ser: 1.03 mg/dL (ref 0.50–1.35)
GFR calc Af Amer: 76 mL/min — ABNORMAL LOW (ref >90)
GFR calc non Af Amer: 66 mL/min — ABNORMAL LOW (ref >90)
Glucose, Bld: 91 mg/dL (ref 70–99)
Potassium: 4.3 mEq/L (ref 3.7–5.3)
Sodium: 136 mEq/L — ABNORMAL LOW (ref 137–147)
Total Bilirubin: 0.3 mg/dL (ref 0.3–1.2)
Total Protein: 6.8 g/dL (ref 6.0–8.3)

## 2014-01-11 LAB — LIPID PANEL
Cholesterol: 124 mg/dL (ref 0–200)
HDL: 39 mg/dL — ABNORMAL LOW (ref >39)
LDL Cholesterol: 65 mg/dL (ref 0–99)
Total CHOL/HDL Ratio: 3.2 RATIO
Triglycerides: 98 mg/dL (ref <150)
VLDL: 20 mg/dL (ref 0–40)

## 2014-01-11 LAB — CBC
HCT: 35.5 % — ABNORMAL LOW (ref 39.0–52.0)
Hemoglobin: 11.2 g/dL — ABNORMAL LOW (ref 13.0–17.0)
MCH: 26.2 pg (ref 26.0–34.0)
MCHC: 31.5 g/dL (ref 30.0–36.0)
MCV: 82.9 fL (ref 78.0–100.0)
PLATELETS: 283 10*3/uL (ref 150–400)
RBC: 4.28 MIL/uL (ref 4.22–5.81)
RDW: 16.2 % — AB (ref 11.5–15.5)
WBC: 8.2 10*3/uL (ref 4.0–10.5)

## 2014-01-11 LAB — URINE CULTURE: Colony Count: 30000

## 2014-01-11 LAB — LIPASE, BLOOD: LIPASE: 39 U/L (ref 11–59)

## 2014-01-11 MED ORDER — GUAIFENESIN ER 600 MG PO TB12
600.0000 mg | ORAL_TABLET | Freq: Two times a day (BID) | ORAL | Status: DC
  Administered 2014-01-11 – 2014-01-13 (×5): 600 mg via ORAL
  Filled 2014-01-11 (×6): qty 1

## 2014-01-11 MED ORDER — BENZONATATE 100 MG PO CAPS: 200.0000 mg | ORAL_CAPSULE | Freq: Two times a day (BID) | ORAL | Status: DC | PRN

## 2014-01-11 MED ORDER — ALBUTEROL SULFATE (2.5 MG/3ML) 0.083% IN NEBU
2.5000 mg | INHALATION_SOLUTION | RESPIRATORY_TRACT | Status: DC | PRN
Start: 2014-01-11 — End: 2014-01-13

## 2014-01-11 MED ORDER — LEVOFLOXACIN IN D5W 750 MG/150ML IV SOLN
750.0000 mg | INTRAVENOUS | Status: DC
  Administered 2014-01-12: 750 mg via INTRAVENOUS
  Filled 2014-01-11: qty 150

## 2014-01-11 NOTE — Progress Notes (Addendum)
ANTIBIOTIC CONSULT NOTE   Pharmacy Consult for levofloxacin Indication: pneumonia  Allergies  Allergen Reactions  . Benadryl [Diphenhydramine Hcl] Other (See Comments)    Upset stomach   . Penicillins Hives and Itching    Patient Measurements: Height: 5\' 7"  (170.2 cm) Weight: 124 lb 12.8 oz (56.609 kg) IBW/kg (Calculated) : 66.1 Adjusted Body Weight:   Vital Signs: Temp: 98.1 F (36.7 C) (12/01 1237) Temp Source: Oral (12/01 1237) BP: 134/74 mmHg (12/01 1237) Pulse Rate: 72 (12/01 1237) Intake/Output from previous day: 11/30 0701 - 12/01 0700 In: 1270 [P.O.:720; I.V.:550] Out: 825 [Urine:825] Intake/Output from this shift: Total I/O In: 440 [P.O.:240; I.V.:200] Out: 300 [Urine:300]  Labs:  Recent Labs  01/09/14 1558 01/10/14 0429 01/11/14 0350  WBC 8.3 7.7 8.2  HGB 11.1* 11.0* 11.2*  PLT 258 256 283  CREATININE 0.83 0.85 1.03   Estimated Creatinine Clearance: 44.3 mL/min (by C-G formula based on Cr of 1.03). No results for input(s): VANCOTROUGH, VANCOPEAK, VANCORANDOM, GENTTROUGH, GENTPEAK, GENTRANDOM, TOBRATROUGH, TOBRAPEAK, TOBRARND, AMIKACINPEAK, AMIKACINTROU, AMIKACIN in the last 72 hours.   Microbiology: Recent Results (from the past 720 hour(s))  Urine culture     Status: None   Collection Time: 01/09/14  7:37 PM  Result Value Ref Range Status   Specimen Description URINE, CLEAN CATCH  Final   Special Requests NONE  Final   Culture  Setup Time   Final    01/10/2014 01:19 Performed at [facility]   Final    30,000 COLONIES/ML Performed at [facility]    Culture   Final    Multiple bacterial morphotypes present, none predominant. Suggest appropriate recollection if clinically indicated. Performed at [facility]    Report Status 01/11/2014 FINAL  Final   \ Assessment: 80yo homeless male c/o of fatigue, sore throat, nausea, and vomiting. Hx of COPD. Pharmacy is asked to dose Levaquin and Vanc for  CAP/sepsis. First doses ordered to be given in the ED.  11/29 >> Levaquin >> 11/29 >> Vanc >> 12/1  Tmax: AF WBCs: WNL Renal: WNL, est CrCl = 68ml/min  11/29 urine: 30K multiple morph  Drug level / dose changes info:  Goal of Therapy:  Dose for renal function/indication  Plan:  Day #2 levofloxacin  Based on CrCl < 70ml/min, change levofloxacin to 750mg  IV q48h  Doreene Eland, PharmD, BCPS.   Pager: [phone]  01/11/2014,2:17 PM

## 2014-01-11 NOTE — Progress Notes (Signed)
Patient is alert to person only, patient found sitting on the floor by nurse Denton Ar and told me and Suan Halter that he sat on the floor intentionally Neta Mends RN 12:38 PM 01-11-2014 12:39am

## 2014-01-11 NOTE — ED Provider Notes (Signed)
Medical screening examination/treatment/procedure(s) were conducted as a shared visit with non-physician practitioner(s) and myself.  I personally evaluated the patient during the encounter.   EKG Interpretation   Date/Time:  Sunday January 09 2014 22:30:55 EST Ventricular Rate:  66 PR Interval:  194 QRS Duration: 92 QT Interval:  424 QTC Calculation: 444 R Axis:   55 Text Interpretation:  Normal sinus rhythm Normal ECG ED PHYSICIAN  INTERPRETATION AVAILABLE IN [facility] Confirmed by TEST, Record  ([id number]) on 01/11/2014 7:10:58 AM      80  yo male presenting with malaise and cough.  On exam, well appearing, nontoxic, not distressed, disheveled, normal respiratory effort, normal perfusion, abd soft but tender in epigastrium.  Workup revealed elevated lipase and RLL opacity, ? Pneumonia.  Admit for further treatment.    Clinical Impression: 1. Cough   2. Abdominal pain   3. Abdominal pain       Artis Delay, MD 01/11/14 [phone]

## 2014-01-11 NOTE — Clinical Documentation Improvement (Signed)
PLEASE SPECIFY TYPE & ACUITY IF CHF:   Possible Clinical Conditions? Chronic Systolic Congestive Heart Failure Chronic Diastolic Congestive Heart Failure Chronic Systolic & Diastolic Congestive Heart Failure Acute Systolic Congestive Heart Failure Acute Diastolic Congestive Heart Failure Acute Systolic & Diastolic Congestive Heart Failure Acute on Chronic Systolic Congestive Heart Failure Acute on Chronic Diastolic Congestive Heart Failure Acute on Chronic Systolic & Diastolic Congestive Heart Failure Other Condition Cannot Clinically Determine  Supporting Information:(As per notes) CHF (congestive heart failure)  Monitor for Signs and Sxs of Fluid Overload   Thank You, Alessandra Grout, RN, BSN, CCDS,Clinical Documentation Specialist:  [phone]  [phone]=[facility] [place]- Health Information Management

## 2014-01-11 NOTE — Progress Notes (Signed)
PATIENT DETAILS Name: Dale Mcfarland Age: 80 y.o. Sex: male Date of Birth: Mar 03, 1929 Admit Date: 01/09/2014 Admitting Physician Theressa Millard, MD PCP:No PCP Per Patient  Subjective: Mildly confused this morning-But answers most of my questions appropriately.  Assessment/Plan: Active Problems:   XNT:ZGYFVCBS, afebrile. Continue Levaquin, stop IV Vancomycin.    ?Acute Pancreatitis:denies abd pain today-but mild confused. Not sure if he had pain on admission. Although CT Abd shows possible Cholelithiassis and stable extra/intra hepatic dilatation-suspect the etiology of possible pancreatitis is ETOH. However will get a Ultrasound Abdomen as it is a more reliable study.Advance to full liquids.    ETOH WHQ:PRFFMB confusion this am, but not tremulous. Continue Ativan per CIWA protocol.Continue MVI/Thiamine/Folate.    COPD:lungs clear, continue with nebs.    Chronic Diastolic WGY:[id number].Follow clinically.    Tobacco Abuse:continue transdermal Nicotine.    3.9 cm infra-renal aneurysm: incidental finding on CT Abdomen.Will need periodic outpatient surveillance CT Scans.    Homelessness: Social work eval appreciated  Disposition: Remain inpatient  Antibiotics: See below  Anti-infectives    Start     Dose/Rate Route Frequency Ordered Stop   01/10/14 2200  levofloxacin (LEVAQUIN) IVPB 750 mg     750 mg100 mL/hr over 90 Minutes Intravenous Every 24 hours 01/09/14 2255     01/10/14 1000  vancomycin (VANCOCIN) IVPB 750 mg/150 ml premix  Status:  Discontinued     750 mg150 mL/hr over 60 Minutes Intravenous Every 12 hours 01/09/14 2255 01/11/14 0846   01/10/14 0030  levofloxacin (LEVAQUIN) IVPB 500 mg  Status:  Discontinued     500 mg100 mL/hr over 60 Minutes Intravenous Every 24 hours 01/10/14 0023 01/10/14 0026   01/09/14 2230  levofloxacin (LEVAQUIN) IVPB 750 mg     750 mg100 mL/hr over 90 Minutes Intravenous  Once 01/09/14 2217 01/10/14 0009   01/09/14 2230   vancomycin (VANCOCIN) IVPB 1000 mg/200 mL premix     1,000 mg200 mL/hr over 60 Minutes Intravenous  Once 01/09/14 2217 01/10/14 0149      DVT Prophylaxis: Prophylactic Lovenox   Code Status: Full code   Family Communication None at bedside  Procedures:  None  CONSULTS:  None  Time spent 40 minutes-which includes 50% of the time with face-to-face with patient/ family and coordinating care related to the above assessment and plan.  MEDICATIONS: Scheduled Meds: . folic acid  1 mg Oral Daily  . guaiFENesin  600 mg Oral BID  . levofloxacin (LEVAQUIN) IV  750 mg Intravenous Q24H  . LORazepam  0-4 mg Intravenous Q6H   Followed by  . [START ON 01/12/2014] LORazepam  0-4 mg Intravenous Q12H  . multivitamin with minerals  1 tablet Oral Daily  . nicotine  14 mg Transdermal Daily  . thiamine  100 mg Oral Daily   Or  . thiamine  100 mg Intravenous Daily   Continuous Infusions:  PRN Meds:.acetaminophen **OR** acetaminophen, albuterol, alum & mag hydroxide-simeth, benzonatate, HYDROmorphone (DILAUDID) injection, LORazepam **OR** LORazepam, ondansetron **OR** ondansetron (ZOFRAN) IV, oxyCODONE    PHYSICAL EXAM: Vital signs in last 24 hours: Filed Vitals:   01/10/14 2130 01/11/14 0150 01/11/14 0440 01/11/14 1237  BP: 140/65 133/70 135/70 134/74  Pulse: 70 70 65 72  Temp: 98.9 F (37.2 C) 97.7 F (36.5 C) 98.3 F (36.8 C) 98.1 F (36.7 C)  TempSrc: Oral Oral Oral Oral  Resp: 18 18 18    Height:      Weight:  SpO2: 97% 96% 99% 99%    Weight change:  Filed Weights   01/09/14 2240 01/09/14 2310  Weight: 58.968 kg (130 lb) 56.609 kg (124 lb 12.8 oz)   Body mass index is 19.54 kg/(m^2).   Gen Exam: Awake, mostly, alert with clear speech.   Neck: Supple, No JVD.   Chest: B/L Clear.   CVS: S1 S2 Regular, no murmurs.  Abdomen: soft, BS +, non tender, non distended.  Extremities: no edema, lower extremities warm to touch. Neurologic: Non Focal.   Skin: No Rash.     Wounds: N/A.   Intake/Output from previous day:  Intake/Output Summary (Last 24 hours) at 01/11/14 1256 Last data filed at 01/11/14 1000  Gross per 24 hour  Intake   1470 ml  Output    825 ml  Net    645 ml     LAB RESULTS: CBC  Recent Labs Lab 01/09/14 1558 01/10/14 0429 01/11/14 0350  WBC 8.3 7.7 8.2  HGB 11.1* 11.0* 11.2*  HCT 35.0* 35.6* 35.5*  PLT 258 256 283  MCV 84.1 84.2 82.9  MCH 26.7 26.0 26.2  MCHC 31.7 30.9 31.5  RDW 16.3* 16.3* 16.2*  LYMPHSABS 1.9  --   --   MONOABS 0.6  --   --   EOSABS 0.4  --   --   BASOSABS 0.1  --   --     Chemistries   Recent Labs Lab 01/09/14 1558 01/10/14 0429 01/11/14 0350  NA 140 135* 136*  K 4.5 4.2 4.3  CL 106 100 101  CO2 22 21 22   GLUCOSE 91 117* 91  BUN 21 14 11   CREATININE 0.83 0.85 1.03  CALCIUM 8.9 8.5 8.7    CBG: No results for input(s): GLUCAP in the last 168 hours.  GFR Estimated Creatinine Clearance: 44.3 mL/min (by C-G formula based on Cr of 1.03).  Coagulation profile No results for input(s): INR, PROTIME in the last 168 hours.  Cardiac Enzymes No results for input(s): CKMB, TROPONINI, MYOGLOBIN in the last 168 hours.  Invalid input(s): CK  Invalid input(s): POCBNP No results for input(s): DDIMER in the last 72 hours. No results for input(s): HGBA1C in the last 72 hours.  Recent Labs  01/11/14 0350  CHOL 124  HDL 39*  LDLCALC 65  TRIG 98  CHOLHDL 3.2   No results for input(s): TSH, T4TOTAL, T3FREE, THYROIDAB in the last 72 hours.  Invalid input(s): FREET3 No results for input(s): VITAMINB12, FOLATE, FERRITIN, TIBC, IRON, RETICCTPCT in the last 72 hours.  Recent Labs  01/09/14 1558 01/11/14 0350  LIPASE 257* 39    Urine Studies No results for input(s): UHGB, CRYS in the last 72 hours.  Invalid input(s): UACOL, UAPR, USPG, UPH, UTP, UGL, UKET, UBIL, UNIT, UROB, ULEU, UEPI, UWBC, URBC, UBAC, CAST, UCOM, BILUA  MICROBIOLOGY: Recent Results (from the past 240 hour(s))   Urine culture     Status: None   Collection Time: 01/09/14  7:37 PM  Result Value Ref Range Status   Specimen Description URINE, CLEAN CATCH  Final   Special Requests NONE  Final   Culture  Setup Time   Final    01/10/2014 01:19 Performed at [place]   Final    30,000 COLONIES/ML Performed at [facility]    Culture   Final    Multiple bacterial morphotypes present, none predominant. Suggest appropriate recollection if clinically indicated. Performed at [facility]  Report Status 01/11/2014 FINAL  Final    RADIOLOGY STUDIES/RESULTS: Dg Chest 2 View  01/09/2014   CLINICAL DATA:  Cough  and congestion for 1 month, homeless  EXAM: CHEST  2 VIEW  COMPARISON:  10/14/2013  FINDINGS: Cardiomediastinal silhouette is stable. Again noted chronic interstitial prominence. No definite superimposed infiltrate or pulmonary edema. Mild degenerative changes thoracic spine.  IMPRESSION: Stable chronic interstitial prominence. No definite superimposed infiltrate or pulmonary edema.   Electronically Signed   By: Lahoma Crocker M.D.   On: 01/09/2014 16:11   Ct Abdomen Pelvis W Contrast  01/09/2014   CLINICAL DATA:  Generalized abdominal pain.  EXAM: CT ABDOMEN AND PELVIS WITH CONTRAST  TECHNIQUE: Multidetector CT imaging of the abdomen and pelvis was performed using the standard protocol following bolus administration of intravenous contrast.  CONTRAST:  30mL OMNIPAQUE IOHEXOL 300 MG/ML SOLN, 123mL OMNIPAQUE IOHEXOL 300 MG/ML SOLN  COMPARISON:  CT scan of October 14, 2013.  FINDINGS: Stable compression deformity of L2 vertebral body consistent with old fracture. Slightly increased opacity is noted posteriorly in right lower lobe suggesting developing atelectasis or possibly pneumonia.  Mild cholelithiasis is noted without gallbladder wall thickening or pericholecystic fluid. Stable mild intrahepatic and extrahepatic biliary dilatation is noted. No focal abnormality  is noted in the liver, spleen or pancreas. Adrenal glands appear normal. No hydronephrosis or renal obstruction is noted. Stable left renal cyst is noted. Small sliding-type hiatal hernia is noted. 3.9 cm infrarenal abdominal aortic aneurysm is noted. Appendix appears normal. There is no evidence of bowel obstruction. No abnormal fluid collection is noted. Urinary bladder appears normal. Mild bilateral fat containing inguinal hernias are noted. Mild prostatic enlargement is noted. No significant adenopathy is noted.  IMPRESSION: Mild cholelithiasis is noted without evidence of cholecystitis.  Stable small sliding-type hiatal hernia.  3.9 cm infrarenal abdominal aortic aneurysm.  Stable mild bilateral fat containing inguinal hernias.  Stable mild intrahepatic and extrahepatic biliary dilatation.  No other significant abnormality seen in the abdomen or pelvis.   Electronically Signed   By: Sabino Dick M.D.   On: 01/09/2014 20:15    Oren Binet, MD  [facility] Pager:[phone]  If 7PM-7AM, please contact night-coverage [email or web address] Password [email or web address] 01/11/2014, 12:56 PM   LOS: 2 days

## 2014-01-11 NOTE — Plan of Care (Signed)
Problem: Phase I Progression Outcomes Goal: OOB as tolerated unless otherwise ordered Outcome: Progressing Goal: Code status addressed with pt/family Outcome: Completed/Met Date Met:  01/11/14 Goal: Hemodynamically stable Outcome: Progressing  Problem: Phase II Progression Outcomes Goal: Wean O2 if indicated Outcome: Completed/Met Date Met:  01/11/14 Goal: Pain controlled Outcome: Completed/Met Date Met:  01/11/14

## 2014-01-11 NOTE — Plan of Care (Signed)
Problem: Phase I Progression Outcomes Goal: Initial discharge plan identified Outcome: Completed/Met Date Met:  01/11/14

## 2014-01-11 NOTE — Plan of Care (Signed)
Problem: Phase I Progression Outcomes Goal: Voiding-avoid urinary catheter unless indicated Outcome: Completed/Met Date Met:  01/11/14     

## 2014-01-12 DIAGNOSIS — J438 Other emphysema: Secondary | ICD-10-CM

## 2014-01-12 LAB — COMPREHENSIVE METABOLIC PANEL
ALK PHOS: 130 U/L — AB (ref 39–117)
ALT: 8 U/L (ref 0–53)
AST: 14 U/L (ref 0–37)
Albumin: 2.7 g/dL — ABNORMAL LOW (ref 3.5–5.2)
Anion gap: 10 (ref 5–15)
BUN: 11 mg/dL (ref 6–23)
CALCIUM: 8.7 mg/dL (ref 8.4–10.5)
CO2: 23 meq/L (ref 19–32)
Chloride: 106 mEq/L (ref 96–112)
Creatinine, Ser: 0.95 mg/dL (ref 0.50–1.35)
GFR, EST AFRICAN AMERICAN: 87 mL/min — AB (ref >90)
GFR, EST NON AFRICAN AMERICAN: 75 mL/min — AB (ref >90)
Glucose, Bld: 87 mg/dL (ref 70–99)
POTASSIUM: 4.1 meq/L (ref 3.7–5.3)
SODIUM: 139 meq/L (ref 137–147)
Total Bilirubin: <0.2 mg/dL — ABNORMAL LOW (ref 0.3–1.2)
Total Protein: 6.1 g/dL (ref 6.0–8.3)

## 2014-01-12 LAB — CBC
HCT: 33.3 % — ABNORMAL LOW (ref 39.0–52.0)
Hemoglobin: 10.5 g/dL — ABNORMAL LOW (ref 13.0–17.0)
MCH: 26.4 pg (ref 26.0–34.0)
MCHC: 31.5 g/dL (ref 30.0–36.0)
MCV: 83.9 fL (ref 78.0–100.0)
Platelets: 245 10*3/uL (ref 150–400)
RBC: 3.97 MIL/uL — AB (ref 4.22–5.81)
RDW: 16.3 % — ABNORMAL HIGH (ref 11.5–15.5)
WBC: 7.2 10*3/uL (ref 4.0–10.5)

## 2014-01-12 LAB — LIPASE, BLOOD: Lipase: 33 U/L (ref 11–59)

## 2014-01-12 NOTE — Progress Notes (Signed)
CSW assisting with d/c planning. PN reviewed. PT is recommending SNF placement following hospital d/c. CSW will meet with pt and offer assistance with rehab placement.  Werner Lean LCSW [phone]

## 2014-01-12 NOTE — Progress Notes (Signed)
PATIENT DETAILS Name: Dale Mcfarland Age: 80 y.o. Sex: male Date of Birth: 31-Oct-1929 Admit Date: 01/09/2014 Admitting Physician Theressa Millard, MD PCP:No PCP Per Patient  Subjective: Sleepy but easily awakens-remains slightly confused. Continues to have a productive cough. No complaints of pain.  Assessment/Plan: Active Problems:   QVZ:DGLOVFIE, afebrile. Continue Levaquin-  IV Vancomycin is continued.    ?Acute Pancreatitis: Continues to deny abdominal pain and is tolerating full liquids quite well. Not sure if he had pain on admission. - CT Abd shows possible cholelithiasis and stable extra/intra hepatic - ultrasound abdomen negative for acute pancreatitis as well therefore suspect if it was present, it has resolved now. -Advanced to solids  Cholelithiasis -Seen on CTs can and ultrasound imaging    ETOH use: Continues to have ongoing mild confusion- Continue Ativan per CIWA protocol.Continue MVI/Thiamine/Folate.    COPD:lungs clear, continue with nebs.    Chronic Diastolic PPI:RJJOACZYSAY.Follow clinically.    Tobacco Abuse:continue transdermal Nicotine.    3.9 cm infra-renal aneurysm: incidental finding on CT Abdomen.Will need periodic outpatient surveillance CT Scans.    Homelessness: Social work eval appreciated  Disposition: Remain inpatient  Antibiotics: See below  Anti-infectives    Start     Dose/Rate Route Frequency Ordered Stop   01/12/14 2200  levofloxacin (LEVAQUIN) IVPB 750 mg     750 mg100 mL/hr over 90 Minutes Intravenous Every 48 hours 01/11/14 1420     01/10/14 2200  levofloxacin (LEVAQUIN) IVPB 750 mg  Status:  Discontinued     750 mg100 mL/hr over 90 Minutes Intravenous Every 24 hours 01/09/14 2255 01/11/14 1420   01/10/14 1000  vancomycin (VANCOCIN) IVPB 750 mg/150 ml premix  Status:  Discontinued     750 mg150 mL/hr over 60 Minutes Intravenous Every 12 hours 01/09/14 2255 01/11/14 0846   01/10/14 0030  levofloxacin (LEVAQUIN) IVPB  500 mg  Status:  Discontinued     500 mg100 mL/hr over 60 Minutes Intravenous Every 24 hours 01/10/14 0023 01/10/14 0026   01/09/14 2230  levofloxacin (LEVAQUIN) IVPB 750 mg     750 mg100 mL/hr over 90 Minutes Intravenous  Once 01/09/14 2217 01/10/14 0009   01/09/14 2230  vancomycin (VANCOCIN) IVPB 1000 mg/200 mL premix     1,000 mg200 mL/hr over 60 Minutes Intravenous  Once 01/09/14 2217 01/10/14 0149      DVT Prophylaxis: Prophylactic Lovenox   Code Status: Full code   Family Communication None at bedside  Procedures:  None  CONSULTS:  None  Time spent 30 minutes  MEDICATIONS: Scheduled Meds: . folic acid  1 mg Oral Daily  . guaiFENesin  600 mg Oral BID  . levofloxacin (LEVAQUIN) IV  750 mg Intravenous Q48H  . LORazepam  0-4 mg Intravenous Q12H  . multivitamin with minerals  1 tablet Oral Daily  . nicotine  14 mg Transdermal Daily  . thiamine  100 mg Oral Daily   Or  . thiamine  100 mg Intravenous Daily   Continuous Infusions:  PRN Meds:.acetaminophen **OR** acetaminophen, albuterol, alum & mag hydroxide-simeth, benzonatate, HYDROmorphone (DILAUDID) injection, LORazepam **OR** LORazepam, ondansetron **OR** ondansetron (ZOFRAN) IV, oxyCODONE    PHYSICAL EXAM: Vital signs in last 24 hours: Filed Vitals:   01/12/14 0300 01/12/14 0449 01/12/14 1000 01/12/14 1530  BP: 154/65 154/64 157/65 149/71  Pulse: 59 59 66 72  Temp:  97.5 F (36.4 C) 97.9 F (36.6 C) 97.7 F (36.5 C)  TempSrc:  Axillary Oral Oral  Resp: 14  14 16 16   Height:      Weight:      SpO2: 98% 98% 97% 99%    Weight change:  Filed Weights   01/09/14 2240 01/09/14 2310  Weight: 58.968 kg (130 lb) 56.609 kg (124 lb 12.8 oz)   Body mass index is 19.54 kg/(m^2).   Gen Exam: Sleepy but easily awakened -follows commands well-oriented to time place and person. Neck: Supple, No JVD.   Chest: B/L Clear. Congested cough noted.   CVS: S1 S2 Regular, no murmurs.  Abdomen: soft, BS +, non tender,  non distended.  Extremities: no edema, lower extremities warm to touch. Neurologic: Non Focal.   Skin: No Rash.   Wounds: N/A.   Intake/Output from previous day:  Intake/Output Summary (Last 24 hours) at 01/12/14 1531 Last data filed at 01/12/14 1302  Gross per 24 hour  Intake    720 ml  Output    950 ml  Net   -230 ml     LAB RESULTS: CBC  Recent Labs Lab 01/09/14 1558 01/10/14 0429 01/11/14 0350 01/12/14 0421  WBC 8.3 7.7 8.2 7.2  HGB 11.1* 11.0* 11.2* 10.5*  HCT 35.0* 35.6* 35.5* 33.3*  PLT 258 256 283 245  MCV 84.1 84.2 82.9 83.9  MCH 26.7 26.0 26.2 26.4  MCHC 31.7 30.9 31.5 31.5  RDW 16.3* 16.3* 16.2* 16.3*  LYMPHSABS 1.9  --   --   --   MONOABS 0.6  --   --   --   EOSABS 0.4  --   --   --   BASOSABS 0.1  --   --   --     Chemistries   Recent Labs Lab 01/09/14 1558 01/10/14 0429 01/11/14 0350 01/12/14 0421  NA 140 135* 136* 139  K 4.5 4.2 4.3 4.1  CL 106 100 101 106  CO2 22 21 22 23   GLUCOSE 91 117* 91 87  BUN 21 14 11 11   CREATININE 0.83 0.85 1.03 0.95  CALCIUM 8.9 8.5 8.7 8.7    CBG: No results for input(s): GLUCAP in the last 168 hours.  GFR Estimated Creatinine Clearance: 48 mL/min (by C-G formula based on Cr of 0.95).  Coagulation profile No results for input(s): INR, PROTIME in the last 168 hours.  Cardiac Enzymes No results for input(s): CKMB, TROPONINI, MYOGLOBIN in the last 168 hours.  Invalid input(s): CK  Invalid input(s): POCBNP No results for input(s): DDIMER in the last 72 hours. No results for input(s): HGBA1C in the last 72 hours.  Recent Labs  01/11/14 0350  CHOL 124  HDL 39*  LDLCALC 65  TRIG 98  CHOLHDL 3.2   No results for input(s): TSH, T4TOTAL, T3FREE, THYROIDAB in the last 72 hours.  Invalid input(s): FREET3 No results for input(s): VITAMINB12, FOLATE, FERRITIN, TIBC, IRON, RETICCTPCT in the last 72 hours.  Recent Labs  01/11/14 0350 01/12/14 0421  LIPASE 39 33    Urine Studies No results for  input(s): UHGB, CRYS in the last 72 hours.  Invalid input(s): UACOL, UAPR, USPG, UPH, UTP, UGL, UKET, UBIL, UNIT, UROB, ULEU, UEPI, UWBC, URBC, UBAC, CAST, UCOM, BILUA  MICROBIOLOGY: Recent Results (from the past 240 hour(s))  Urine culture     Status: None   Collection Time: 01/09/14  7:37 PM  Result Value Ref Range Status   Specimen Description URINE, CLEAN CATCH  Final   Special Requests NONE  Final   Culture  Setup Time   Final    01/10/2014  01:19 Performed at [place]   Final    30,000 COLONIES/ML Performed at [facility]   Final    Multiple bacterial morphotypes present, none predominant. Suggest appropriate recollection if clinically indicated. Performed at [facility]    Report Status 01/11/2014 FINAL  Final    RADIOLOGY STUDIES/RESULTS: Dg Chest 2 View  01/09/2014   CLINICAL DATA:  Cough  and congestion for 1 month, homeless  EXAM: CHEST  2 VIEW  COMPARISON:  10/14/2013  FINDINGS: Cardiomediastinal silhouette is stable. Again noted chronic interstitial prominence. No definite superimposed infiltrate or pulmonary edema. Mild degenerative changes thoracic spine.  IMPRESSION: Stable chronic interstitial prominence. No definite superimposed infiltrate or pulmonary edema.   Electronically Signed   By: Lahoma Crocker M.D.   On: 01/09/2014 16:11   Ct Abdomen Pelvis W Contrast  01/09/2014   CLINICAL DATA:  Generalized abdominal pain.  EXAM: CT ABDOMEN AND PELVIS WITH CONTRAST  TECHNIQUE: Multidetector CT imaging of the abdomen and pelvis was performed using the standard protocol following bolus administration of intravenous contrast.  CONTRAST:  29mL OMNIPAQUE IOHEXOL 300 MG/ML SOLN, 163mL OMNIPAQUE IOHEXOL 300 MG/ML SOLN  COMPARISON:  CT scan of October 14, 2013.  FINDINGS: Stable compression deformity of L2 vertebral body consistent with old fracture. Slightly increased opacity is noted posteriorly in right lower lobe  suggesting developing atelectasis or possibly pneumonia.  Mild cholelithiasis is noted without gallbladder wall thickening or pericholecystic fluid. Stable mild intrahepatic and extrahepatic biliary dilatation is noted. No focal abnormality is noted in the liver, spleen or pancreas. Adrenal glands appear normal. No hydronephrosis or renal obstruction is noted. Stable left renal cyst is noted. Small sliding-type hiatal hernia is noted. 3.9 cm infrarenal abdominal aortic aneurysm is noted. Appendix appears normal. There is no evidence of bowel obstruction. No abnormal fluid collection is noted. Urinary bladder appears normal. Mild bilateral fat containing inguinal hernias are noted. Mild prostatic enlargement is noted. No significant adenopathy is noted.  IMPRESSION: Mild cholelithiasis is noted without evidence of cholecystitis.  Stable small sliding-type hiatal hernia.  3.9 cm infrarenal abdominal aortic aneurysm.  Stable mild bilateral fat containing inguinal hernias.  Stable mild intrahepatic and extrahepatic biliary dilatation.  No other significant abnormality seen in the abdomen or pelvis.   Electronically Signed   By: Sabino Dick M.D.   On: 01/09/2014 20:15   US Abdomen Limited Ruq  01/12/2014   CLINICAL DATA:  Follow up pancreatitis.  Subsequent encounter.  EXAM: US ABDOMEN LIMITED - RIGHT UPPER QUADRANT  COMPARISON:  Abdominal ultrasound 02/28/2011, and CT of the abdomen and pelvis performed 01/09/2014  FINDINGS: Gallbladder:  Multiple mobile stones are seen layering dependently within the gallbladder, measuring up to 1.4 cm in size. No gallbladder wall thickening or pericholecystic fluid is seen. No ultrasonographic Murphy's sign is elicited.  Common bile duct:  Diameter: 0.9 cm, borderline prominent for the patient's age.  Liver:  No focal lesion identified. Within normal limits in parenchymal echogenicity. There is mild prominence of the intrahepatic biliary ducts.  IMPRESSION: 1. No acute abnormality  seen within the abdomen. 2. Cholelithiasis; gallbladder otherwise unremarkable in appearance. 3. Borderline prominence of the common hepatic duct and intrahepatic biliary ducts. This may remain within normal limits, as there is no evidence of distal obstruction on recent CT.   Electronically Signed   By: Garald Balding M.D.   On: 01/12/2014 05:25    Debbe Odea, MD  [facility]  Pager: [email or web address] Password [email or web address] 01/12/2014, 3:31 PM   LOS: 3 days
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
## 2014-01-13 MED ORDER — GUAIFENESIN ER 600 MG PO TB12
600.0000 mg | ORAL_TABLET | Freq: Two times a day (BID) | ORAL | Status: DC | PRN
Start: 2014-01-13 — End: 2015-10-07

## 2014-01-13 MED ORDER — THIAMINE HCL 100 MG PO TABS: 100.0000 mg | ORAL_TABLET | Freq: Every day | ORAL | Status: DC

## 2014-01-13 MED ORDER — NICOTINE 7 MG/24HR TD PT24: 7.0000 mg | MEDICATED_PATCH | Freq: Every day | TRANSDERMAL | Status: DC

## 2014-01-13 MED ORDER — FOLIC ACID 1 MG PO TABS: 1.0000 mg | ORAL_TABLET | Freq: Every day | ORAL | Status: DC

## 2014-01-13 MED ORDER — LEVOFLOXACIN 750 MG PO TABS: 750.0000 mg | ORAL_TABLET | Freq: Every day | ORAL | Status: DC

## 2014-01-13 MED ORDER — AMLODIPINE BESYLATE 5 MG PO TABS: 5.0000 mg | ORAL_TABLET | Freq: Every day | ORAL | Status: DC
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
## 2014-01-13 NOTE — Progress Notes (Signed)
Assessment unchanged. Pt given dc instructions and prescriptions as provided by Dr. Wynelle Cleveland. Pt verbalized understanding through teach back as well as friends at bedside who plans to accompany pt on errands and to home. Discharged via wc to [place] entrance to meet awaiting vehicle. Accompanied by friends x 2 and NT.

## 2014-01-13 NOTE — Progress Notes (Signed)
CSW assisting with d/c planning. Met with pt at bedside. Pt has two friends in the room with him . " Is there a bank at this hospital. I owe these men money. "  Friends confirm that he loans them money and they loan him money. Friends plan to take him to the bank and assist him with renting a room. Friends state they will assist him with care / support. Friends also note that they have attempted to get him in rehab in the past but he has walked out. They have tried to assist him with housing but when it's time to sign papers pt cannot be found. PT is recommending SNF placement at this time. Pt is declining. Capacity consult would be helpful prior to d/c. CSW will continue to follow to assist with d/c planning.  Werner Lean LCSW [phone]

## 2014-01-15 ENCOUNTER — Emergency Department (HOSPITAL_COMMUNITY): Payer: Non-veteran care

## 2014-01-15 ENCOUNTER — Other Ambulatory Visit: Payer: Self-pay

## 2014-01-15 ENCOUNTER — Emergency Department (HOSPITAL_COMMUNITY)
Admission: EM | Admit: 2014-01-15 | Discharge: 2014-01-15 | Disposition: A | Discharge status: Home / Self Care | Payer: Non-veteran care | Attending: Emergency Medicine | Admitting: Emergency Medicine

## 2014-01-15 ENCOUNTER — Encounter (HOSPITAL_COMMUNITY): Payer: Self-pay

## 2014-01-15 DIAGNOSIS — Z8719 Personal history of other diseases of the digestive system: Secondary | ICD-10-CM | POA: Diagnosis not present

## 2014-01-15 DIAGNOSIS — Z87828 Personal history of other (healed) physical injury and trauma: Secondary | ICD-10-CM | POA: Diagnosis not present

## 2014-01-15 DIAGNOSIS — E119 Type 2 diabetes mellitus without complications: Secondary | ICD-10-CM | POA: Insufficient documentation

## 2014-01-15 DIAGNOSIS — Z880 Allergy status to penicillin: Secondary | ICD-10-CM | POA: Insufficient documentation

## 2014-01-15 DIAGNOSIS — Z8659 Personal history of other mental and behavioral disorders: Secondary | ICD-10-CM | POA: Insufficient documentation

## 2014-01-15 DIAGNOSIS — G8929 Other chronic pain: Secondary | ICD-10-CM | POA: Insufficient documentation

## 2014-01-15 DIAGNOSIS — D649 Anemia, unspecified: Secondary | ICD-10-CM | POA: Diagnosis not present

## 2014-01-15 DIAGNOSIS — Z720 Tobacco use: Secondary | ICD-10-CM | POA: Diagnosis not present

## 2014-01-15 DIAGNOSIS — Z8701 Personal history of pneumonia (recurrent): Secondary | ICD-10-CM | POA: Diagnosis not present

## 2014-01-15 DIAGNOSIS — Z79899 Other long term (current) drug therapy: Secondary | ICD-10-CM | POA: Insufficient documentation

## 2014-01-15 DIAGNOSIS — J449 Chronic obstructive pulmonary disease, unspecified: Secondary | ICD-10-CM | POA: Diagnosis not present

## 2014-01-15 DIAGNOSIS — R0602 Shortness of breath: Secondary | ICD-10-CM

## 2014-01-15 DIAGNOSIS — I10 Essential (primary) hypertension: Secondary | ICD-10-CM | POA: Insufficient documentation

## 2014-01-15 DIAGNOSIS — R110 Nausea: Secondary | ICD-10-CM | POA: Diagnosis present

## 2014-01-15 DIAGNOSIS — R5383 Other fatigue: Secondary | ICD-10-CM | POA: Diagnosis not present

## 2014-01-15 LAB — CBC WITH DIFFERENTIAL/PLATELET
Basophils Absolute: 0 10*3/uL (ref 0.0–0.1)
Basophils Relative: 0 % (ref 0–1)
EOS ABS: 0.3 10*3/uL (ref 0.0–0.7)
EOS PCT: 4 % (ref 0–5)
HEMATOCRIT: 34.5 % — AB (ref 39.0–52.0)
Hemoglobin: 11.2 g/dL — ABNORMAL LOW (ref 13.0–17.0)
LYMPHS PCT: 16 % (ref 12–46)
Lymphs Abs: 1.4 10*3/uL (ref 0.7–4.0)
MCH: 27.1 pg (ref 26.0–34.0)
MCHC: 32.5 g/dL (ref 30.0–36.0)
MCV: 83.5 fL (ref 78.0–100.0)
MONO ABS: 0.7 10*3/uL (ref 0.1–1.0)
Monocytes Relative: 8 % (ref 3–12)
Neutro Abs: 6.6 10*3/uL (ref 1.7–7.7)
Neutrophils Relative %: 72 % (ref 43–77)
Platelets: 253 10*3/uL (ref 150–400)
RBC: 4.13 MIL/uL — ABNORMAL LOW (ref 4.22–5.81)
RDW: 16.5 % — AB (ref 11.5–15.5)
WBC: 9.1 10*3/uL (ref 4.0–10.5)

## 2014-01-15 LAB — BASIC METABOLIC PANEL
Anion gap: 12 (ref 5–15)
BUN: 25 mg/dL — AB (ref 6–23)
CO2: 23 meq/L (ref 19–32)
Calcium: 9 mg/dL (ref 8.4–10.5)
Chloride: 102 mEq/L (ref 96–112)
Creatinine, Ser: 0.93 mg/dL (ref 0.50–1.35)
GFR calc Af Amer: 88 mL/min — ABNORMAL LOW (ref >90)
GFR, EST NON AFRICAN AMERICAN: 76 mL/min — AB (ref >90)
GLUCOSE: 90 mg/dL (ref 70–99)
Potassium: 4.1 mEq/L (ref 3.7–5.3)
SODIUM: 137 meq/L (ref 137–147)

## 2014-01-15 LAB — I-STAT TROPONIN, ED
Troponin i, poc: 0 ng/mL (ref 0.00–0.08)
Troponin i, poc: 0 ng/mL (ref 0.00–0.08)

## 2014-01-15 LAB — PRO B NATRIURETIC PEPTIDE: Pro B Natriuretic peptide (BNP): 634.7 pg/mL — ABNORMAL HIGH (ref 0–450)

## 2014-01-15 MED ORDER — IPRATROPIUM BROMIDE 0.02 % IN SOLN
0.5000 mg | Freq: Once | RESPIRATORY_TRACT | Status: AC
  Administered 2014-01-15: 0.5 mg via RESPIRATORY_TRACT

## 2014-01-15 MED ORDER — ALBUTEROL SULFATE (2.5 MG/3ML) 0.083% IN NEBU
5.0000 mg | INHALATION_SOLUTION | Freq: Once | RESPIRATORY_TRACT | Status: AC
  Administered 2014-01-15: 5 mg via RESPIRATORY_TRACT

## 2014-01-15 MED ORDER — PREDNISONE 20 MG PO TABS
60.0000 mg | ORAL_TABLET | Freq: Once | ORAL | Status: AC
  Administered 2014-01-15: 60 mg via ORAL
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
## 2014-01-15 NOTE — ED Provider Notes (Signed)
CSN: [id number]     Arrival date & time 01/15/14  0214 History  This chart was scribed for Dale Cable, MD by Starleen Arms, ED Scribe. This patient was seen in room A07C/A07C and the patient's care was started at 2:50 AM.   Chief Complaint  Patient presents with  . Nausea   The history is provided by the patient. No language interpreter was used.   HPI Comments: Dale Mcfarland is a 80 y.o. male who presents to the Emergency Department complaining of a cough with associated mild SOB and headache onset several days ago.  Patient states he has "bronchitis".  Patient denies aggravating or alleviating factors.  Patient is a current smoker.  Patient denies CP.  He reports he feels fatigued.  Apparently pt was found outside as he is homeless and reported feeling sick. Past Medical History  Diagnosis Date  . COPD (chronic obstructive pulmonary disease)   . Chronic back pain   . Pneumonia     01/2011 - CAP vs aspiration pneuonmia  . Depression   . PONV (postoperative nausea and vomiting)   . Hyponatremia     Previously felt secondary to SIADH  . Hypertension   . Upper GI bleed     01/2011 with EGD showing severe candida esophagitis and duodenal bulb erosion  . Anemia     In the setting of UGI bleed 01/2011 requiring blood transfusion  . Homelessness   . Dyspnea     Chronic, thought due to COPD. Echo 02/2010 with EF 30-35%, nuclear study negative, then repeat echo 03/2011 did not show systolic dysfxn, so unclear if truly HF  . Bronchitis   . Diabetes mellitus without complication   . Collar bone fracture     left   Past Surgical History  Procedure Laterality Date  . Tonsillectomy    . Vasectomy    . Appendectomy    . Tonsillectomy    . Colonoscopy    . Esophagogastroduodenoscopy    . Esophagogastroduodenoscopy  02/03/2011    Procedure: ESOPHAGOGASTRODUODENOSCOPY (EGD);  Surgeon: Juanita Craver, MD;  Location: [facility];  Service: Endoscopy;  Laterality: N/A;  .  Esophagogastroduodenoscopy  02/03/2011    Procedure: ESOPHAGOGASTRODUODENOSCOPY (EGD);  Surgeon: Juanita Craver, MD;  Location: [facility];  Service: Endoscopy;  Laterality: N/A;  . Colonoscopy  01/30/2012    Procedure: COLONOSCOPY;  Surgeon: Ladene Artist, MD,FACG;  Location: [facility];  Service: Endoscopy;  Laterality: N/A;   Family History  Problem Relation Age of Onset  . Coronary artery disease Father     Starting in his 53's. Died of massive MI at age 42.  . Schizophrenia Brother   . Coronary artery disease Sister     MI at age 68  . Alzheimer's disease Mother    History  Substance Use Topics  . Smoking status: Current Some Day Smoker -- 1.00 packs/day for 60 years    Types: Cigarettes  . Smokeless tobacco: Never Used     Comment: Since age 76  . Alcohol Use: Yes     Comment: 174 ml of scotch weekly    Review of Systems  Respiratory: Positive for cough and shortness of breath.   Cardiovascular: Negative for chest pain.  Gastrointestinal: Negative for abdominal pain.  Neurological: Positive for headaches.  All other systems reviewed and are negative.     Allergies  Benadryl and Penicillins  Home Medications   Prior to Admission medications   Medication Sig Start Date End Date Taking? Authorizing Provider  amLODipine (NORVASC) 5 MG tablet Take 1 tablet (5 mg total) by mouth daily. 01/13/14   Debbe Odea, MD  folic acid (FOLVITE) 1 MG tablet Take 1 tablet (1 mg total) by mouth daily. 01/13/14   Debbe Odea, MD  guaiFENesin (MUCINEX) 600 MG 12 hr tablet Take 1 tablet (600 mg total) by mouth 2 (two) times daily as needed for to loosen phlegm. 01/13/14   Debbe Odea, MD  levofloxacin (LEVAQUIN) 750 MG tablet Take 1 tablet (750 mg total) by mouth daily. 01/13/14   Debbe Odea, MD  nicotine (NICODERM CQ - DOSED IN MG/24 HR) 7 mg/24hr patch Place 1 patch (7 mg total) onto the skin daily. 01/13/14   Debbe Odea, MD  thiamine 100 MG tablet Take 1 tablet (100 mg total) by  mouth daily. 01/13/14   Debbe Odea, MD   BP 142/60 mmHg  Pulse 74  Temp(Src) 98.7 F (37.1 C) (Oral)  SpO2 97% Physical Exam  Nursing note and vitals reviewed. CONSTITUTIONAL: disheveled HEAD: Normocephalic/atraumatic EYES: EOMI/PERRL ENMT: Mucous membranes moist NECK: supple no meningeal signs SPINE/BACK:entire spine nontender CV: S1/S2 noted, no murmurs/rubs/gallops noted LUNGS: coarse wheeze noted bilaterally ABDOMEN: soft, nontender, no rebound or guarding, bowel sounds noted throughout abdomen GU:no cva tenderness NEURO: Pt is awake/alert/appropriate, moves all extremitiesx4.  No facial droop.   EXTREMITIES: pulses normal/equal, full ROM, nicotine patch on right arm was removed, All other extremities/joints palpated/ranged and nontender SKIN: warm, color normal PSYCH: no abnormalities of mood noted, alert and oriented to situation   ED Course  Procedures   DIAGNOSTIC STUDIES: Oxygen Saturation is 97% on RA, adequate by my interpretation.    COORDINATION OF CARE:  2:53 AM Discussed treatment plan with patient at bedside.  Patient acknowledges and agrees with plan.    6:53 AM Pt has been stable in the ED He is is in no distress He has ambulated without any difficulty Labs reassuring.  Medically he is appropriate for discharge.  However, review of chart reveals recent hospitalization and it was recommended he placed in SNF.  See note below from SW while inpatient    Expand All Collapse All   CSW assisting with d/c planning. Met with pt at bedside. Pt has two friends in the room with him . " Is there a bank at this hospital. I owe these men money. " Friends confirm that he loans them money and they loan him money. Friends plan to take him to the bank and assist him with renting a room. Friends state they will assist him with care / support. Friends also note that they have attempted to get him in rehab in the past but he has walked out. They have tried to assist him with  housing but when it's time to sign papers pt cannot be found. PT is recommending SNF placement at this time. Pt is declining. Capacity consult would be helpful prior to d/c. CSW will continue to follow to assist with d/c planning.  Roselyn Reef Haidinger LCSW [phone]     PLAN AT SIGNOUT IS FOR SOCIAL WORK TO EVALUATE PATIENT FOR POSSIBLE PLACEMENT.  IF PATIENT CONTINUES TO REFUSE SERVICES HE CAN BE DISCHARGED.  Labs Review Labs Reviewed  BASIC METABOLIC PANEL - Abnormal; Notable for the following:    BUN 25 (*)    GFR calc non Af Amer 76 (*)    GFR calc Af Amer 88 (*)    All other components within normal limits  CBC WITH DIFFERENTIAL - Abnormal; Notable for the  following:    RBC 4.13 (*)    Hemoglobin 11.2 (*)    HCT 34.5 (*)    RDW 16.5 (*)    All other components within normal limits  PRO B NATRIURETIC PEPTIDE - Abnormal; Notable for the following:    Pro B Natriuretic peptide (BNP) 634.7 (*)    All other components within normal limits  I-STAT TROPOININ, ED  I-STAT TROPOININ, ED    Imaging Review Dg Chest Portable 1 View  01/15/2014   CLINICAL DATA:  Acute onset of cough, congestion, fever and chills for 3 days. Initial encounter.  EXAM: PORTABLE CHEST - 1 VIEW  COMPARISON:  Chest radiograph performed 01/09/2014  FINDINGS: The lungs are well-aerated. Mild vascular congestion is noted. Mild bilateral atelectasis is seen. There is no evidence of pleural effusion or pneumothorax.  The cardiomediastinal silhouette is borderline normal in size. No acute osseous abnormalities are seen.  IMPRESSION: Mild vascular congestion noted; mild bilateral atelectasis seen.   Electronically Signed   By: Garald Balding M.D.   On: 01/15/2014 03:40     Date: 01/15/2014 4069  Rate: 75  Rhythm: normal sinus rhythm  QRS Axis: normal  Intervals: normal  ST/T Wave abnormalities: nonspecific ST changes  Conduction Disutrbances:none  Narrative Interpretation: artifact noted     Date: 01/15/2014 8614AD   Rate: 81  Rhythm: normal sinus rhythm  QRS Axis: normal  Intervals: normal  ST/T Wave abnormalities: nonspecific ST changes  Conduction Disutrbances:none  Narrative Interpretation:   Old EKG Reviewed: unchanged    MDM   Final diagnoses:  Shortness of breath  Chronic obstructive pulmonary disease, unspecified COPD, unspecified chronic bronchitis type  Other fatigue    Nursing notes including past medical history and social history reviewed and considered in documentation Labs/vital reviewed myself and considered during evaluation Previous records reviewed and considered    I personally performed the services described in this documentation, which was scribed in my presence. The recorded information has been reviewed and is accurate.       Dale Cable, MD 01/15/14 [phone]

## 2014-01-15 NOTE — ED Provider Notes (Signed)
11:27- he has been seen by social work.  He continues to refuse any kind of placement.  Social work is going to work with him as an outpatient, to try to arrange for a domicile.  For today, the patient's plan is to go see his friend at [employer], a local variety store.  He does not express any further needs or desires.  Social work plans on giving him a bus pass.  Richarda Blade, MD 01/15/14 [phone]

## 2014-01-15 NOTE — ED Notes (Signed)
Per EMS: Pt homeless, complains of feeling sick. Found by police. Rales bliaterally. BP 182/90, 86bpm, 98%, 100CBG 16resp.

## 2014-01-15 NOTE — ED Notes (Signed)
Pt. Ambulated around pod with no difficulties

## 2014-01-23 NOTE — Discharge Summary (Addendum)
Physician Discharge Summary  Dale Mcfarland SEG:[id number] DOB: 05-10-1929 DOA: 01/09/2014  PCP: No PCP Per Patient  Admit date: 01/09/2014 Discharge date: 01/13/2014  Time spent: 32minutes   Discharge Condition: stable Diet recommendation: heart healthy  Discharge Diagnoses:  Active Problems:   CAP (community acquired pneumonia)    Alcoholism   CHF (congestive heart failure)- chronic diastolic    COPD (chronic obstructive pulmonary disease)   Tobacco abuse   AAA  History of present illness:  Dale Mcfarland is a 80 y.o. male with a history of COPD, CHF, DM2, HTN and ETOH Abuse who presents to the Ed with complaints of increased SOB and Increased coughing and chest congestion. He reports a nonproductive cough worse this past week. He denies any fevers or chills. He does report epigastric ABD pain. He is homeless and reports that he drinks Alcohol whenever he can, and his last drink was 2 days ago, and when he drinks he will drink 1 pint daily. He was evaluated and a Ct scan of the ABD pelvis revealed a RLL pneumonia and an incidental finding of a 3.9 cm AAA. He also had an elevated Lipase level of 257. He was placed on IV Levaquin and he was referred for medical admission.   Hospital Course:  Active Problems:  CAP: -improved, afebrile. Continue Levaquin to complete course. He states he will be able to afford the prescription.   ?Acute Pancreatitis:  -Continues to deny abdominal pain and is tolerating full liquids quite well.  - Lipase improved from 257 to 33 - CT Abd shows possible cholelithiasis and stable extra/intra hepatic - ultrasound abdomen negative for acute pancreatitis as well therefore suspect if it was present, it has resolved now. - tolerating solids well  Cholelithiasis -Seen on CTs can and ultrasound imaging   ETOH use:  -withdrawal managed with Ativan per CIWA protocol. Has been oriented x 3 for > 24 hrs.    COPD: - no exacerbation noted in  hospital   Chronic Diastolic CHF: - remained compensated.   Tobacco Abuse: - continue transdermal Nicotine .   3.9 cm infra-renal aneurysm:  - incidental finding on CT Abdomen.Will need periodic outpatient surveillance CT Scans.   Homelessness:  - Social work eval appreciated- patient picked up at the hospital by 2 of his friends who state he can stay with them.    Discharge Exam: Filed Weights   01/09/14 2240 01/09/14 2310  Weight: 58.968 kg (130 lb) 56.609 kg (124 lb 12.8 oz)   Filed Vitals:   01/13/14 0559  BP: 152/72  Pulse: 78  Temp: 98.4 F (36.9 C)  Resp: 14    General: AAO x 3, no distress Cardiovascular: RRR, no murmurs  Respiratory: clear to auscultation bilaterally GI: soft, non-tender, non-distended, bowel sound positive  Discharge Instructions You were cared for by a hospitalist during your hospital stay. If you have any questions about your discharge medications or the care you received while you were in the hospital after you are discharged, you can call the unit and asked to speak with the hospitalist on call if the hospitalist that took care of you is not available. Once you are discharged, your primary care physician will handle any further medical issues. Please note that NO REFILLS for any discharge medications will be authorized once you are discharged, as it is imperative that you return to your primary care physician (or establish a relationship with a primary care physician if you do not have one) for your aftercare needs so  that they can reassess your need for medications and monitor your lab values.  Discharge Instructions    Diet - low sodium heart healthy    Complete by:  As directed      Increase activity slowly    Complete by:  As directed             Medication List    TAKE these medications        amLODipine 5 MG tablet  Commonly known as:  NORVASC  Take 1 tablet (5 mg total) by mouth daily.     folic acid 1 MG tablet   Commonly known as:  FOLVITE  Take 1 tablet (1 mg total) by mouth daily.     guaiFENesin 600 MG 12 hr tablet  Commonly known as:  MUCINEX  Take 1 tablet (600 mg total) by mouth 2 (two) times daily as needed for to loosen phlegm.     levofloxacin 750 MG tablet  Commonly known as:  LEVAQUIN  Take 1 tablet (750 mg total) by mouth daily.     nicotine 7 mg/24hr patch  Commonly known as:  NICODERM CQ - dosed in mg/24 hr  Place 1 patch (7 mg total) onto the skin daily.     thiamine 100 MG tablet  Take 1 tablet (100 mg total) by mouth daily.       Allergies  Allergen Reactions  . Benadryl [Diphenhydramine Hcl] Other (See Comments)    Upset stomach   . Penicillins Hives and Itching      The results of significant diagnostics from this hospitalization (including imaging, microbiology, ancillary and laboratory) are listed below for reference.    Significant Diagnostic Studies: Dg Chest 2 View  01/09/2014   CLINICAL DATA:  Cough  and congestion for 1 month, homeless  EXAM: CHEST  2 VIEW  COMPARISON:  10/14/2013  FINDINGS: Cardiomediastinal silhouette is stable. Again noted chronic interstitial prominence. No definite superimposed infiltrate or pulmonary edema. Mild degenerative changes thoracic spine.  IMPRESSION: Stable chronic interstitial prominence. No definite superimposed infiltrate or pulmonary edema.   Electronically Signed   By: Lahoma Crocker M.D.   On: 01/09/2014 16:11   Ct Abdomen Pelvis W Contrast  01/09/2014   CLINICAL DATA:  Generalized abdominal pain.  EXAM: CT ABDOMEN AND PELVIS WITH CONTRAST  TECHNIQUE: Multidetector CT imaging of the abdomen and pelvis was performed using the standard protocol following bolus administration of intravenous contrast.  CONTRAST:  57mL OMNIPAQUE IOHEXOL 300 MG/ML SOLN, 179mL OMNIPAQUE IOHEXOL 300 MG/ML SOLN  COMPARISON:  CT scan of October 14, 2013.  FINDINGS: Stable compression deformity of L2 vertebral body consistent with old fracture.  Slightly increased opacity is noted posteriorly in right lower lobe suggesting developing atelectasis or possibly pneumonia.  Mild cholelithiasis is noted without gallbladder wall thickening or pericholecystic fluid. Stable mild intrahepatic and extrahepatic biliary dilatation is noted. No focal abnormality is noted in the liver, spleen or pancreas. Adrenal glands appear normal. No hydronephrosis or renal obstruction is noted. Stable left renal cyst is noted. Small sliding-type hiatal hernia is noted. 3.9 cm infrarenal abdominal aortic aneurysm is noted. Appendix appears normal. There is no evidence of bowel obstruction. No abnormal fluid collection is noted. Urinary bladder appears normal. Mild bilateral fat containing inguinal hernias are noted. Mild prostatic enlargement is noted. No significant adenopathy is noted.  IMPRESSION: Mild cholelithiasis is noted without evidence of cholecystitis.  Stable small sliding-type hiatal hernia.  3.9 cm infrarenal abdominal aortic aneurysm.  Stable mild bilateral  fat containing inguinal hernias.  Stable mild intrahepatic and extrahepatic biliary dilatation.  No other significant abnormality seen in the abdomen or pelvis.   Electronically Signed   By: Sabino Dick M.D.   On: 01/09/2014 20:15   Dg Chest Portable 1 View  01/15/2014   CLINICAL DATA:  Acute onset of cough, congestion, fever and chills for 3 days. Initial encounter.  EXAM: PORTABLE CHEST - 1 VIEW  COMPARISON:  Chest radiograph performed 01/09/2014  FINDINGS: The lungs are well-aerated. Mild vascular congestion is noted. Mild bilateral atelectasis is seen. There is no evidence of pleural effusion or pneumothorax.  The cardiomediastinal silhouette is borderline normal in size. No acute osseous abnormalities are seen.  IMPRESSION: Mild vascular congestion noted; mild bilateral atelectasis seen.   Electronically Signed   By: Garald Balding M.D.   On: 01/15/2014 03:40   US Abdomen Limited Ruq  01/12/2014    CLINICAL DATA:  Follow up pancreatitis.  Subsequent encounter.  EXAM: US ABDOMEN LIMITED - RIGHT UPPER QUADRANT  COMPARISON:  Abdominal ultrasound 02/28/2011, and CT of the abdomen and pelvis performed 01/09/2014  FINDINGS: Gallbladder:  Multiple mobile stones are seen layering dependently within the gallbladder, measuring up to 1.4 cm in size. No gallbladder wall thickening or pericholecystic fluid is seen. No ultrasonographic Murphy's sign is elicited.  Common bile duct:  Diameter: 0.9 cm, borderline prominent for the patient's age.  Liver:  No focal lesion identified. Within normal limits in parenchymal echogenicity. There is mild prominence of the intrahepatic biliary ducts.  IMPRESSION: 1. No acute abnormality seen within the abdomen. 2. Cholelithiasis; gallbladder otherwise unremarkable in appearance. 3. Borderline prominence of the common hepatic duct and intrahepatic biliary ducts. This may remain within normal limits, as there is no evidence of distal obstruction on recent CT.   Electronically Signed   By: Garald Balding M.D.   On: 01/12/2014 05:25    Microbiology: No results found for this or any previous visit (from the past 240 hour(s)).   Labs: Basic Metabolic Panel: No results for input(s): NA, K, CL, CO2, GLUCOSE, BUN, CREATININE, CALCIUM, MG, PHOS in the last 168 hours. Liver Function Tests: No results for input(s): AST, ALT, ALKPHOS, BILITOT, PROT, ALBUMIN in the last 168 hours. No results for input(s): LIPASE, AMYLASE in the last 168 hours. No results for input(s): AMMONIA in the last 168 hours. CBC: No results for input(s): WBC, NEUTROABS, HGB, HCT, MCV, PLT in the last 168 hours. Cardiac Enzymes: No results for input(s): CKTOTAL, CKMB, CKMBINDEX, TROPONINI in the last 168 hours. BNP: BNP (last 3 results)  Recent Labs  06/15/13 1910 07/04/13 0845 01/15/14 0301  PROBNP 1138.0* 694.6* 634.7*   CBG: No results for input(s): GLUCAP in the last 168  hours.     SignedDebbe Odea, MD [facility] 01/13/2014, 6:37 PM

## 2014-01-28 ENCOUNTER — Encounter (HOSPITAL_COMMUNITY): Payer: Self-pay | Admitting: Emergency Medicine

## 2014-01-28 ENCOUNTER — Emergency Department (HOSPITAL_COMMUNITY)
Admission: EM | Admit: 2014-01-28 | Discharge: 2014-01-29 | Disposition: A | Discharge status: Home / Self Care | Payer: Medicare Other | Attending: Emergency Medicine | Admitting: Emergency Medicine

## 2014-01-28 DIAGNOSIS — I10 Essential (primary) hypertension: Secondary | ICD-10-CM | POA: Insufficient documentation

## 2014-01-28 DIAGNOSIS — M545 Low back pain: Secondary | ICD-10-CM | POA: Insufficient documentation

## 2014-01-28 DIAGNOSIS — Z79899 Other long term (current) drug therapy: Secondary | ICD-10-CM | POA: Diagnosis not present

## 2014-01-28 DIAGNOSIS — Z720 Tobacco use: Secondary | ICD-10-CM | POA: Diagnosis not present

## 2014-01-28 DIAGNOSIS — Z880 Allergy status to penicillin: Secondary | ICD-10-CM | POA: Diagnosis not present

## 2014-01-28 DIAGNOSIS — R110 Nausea: Secondary | ICD-10-CM | POA: Diagnosis present

## 2014-01-28 DIAGNOSIS — D649 Anemia, unspecified: Secondary | ICD-10-CM | POA: Insufficient documentation

## 2014-01-28 DIAGNOSIS — J189 Pneumonia, unspecified organism: Secondary | ICD-10-CM

## 2014-01-28 DIAGNOSIS — G8929 Other chronic pain: Secondary | ICD-10-CM | POA: Diagnosis not present

## 2014-01-28 DIAGNOSIS — J159 Unspecified bacterial pneumonia: Secondary | ICD-10-CM | POA: Diagnosis not present

## 2014-01-28 DIAGNOSIS — Z8719 Personal history of other diseases of the digestive system: Secondary | ICD-10-CM | POA: Diagnosis not present

## 2014-01-28 DIAGNOSIS — E119 Type 2 diabetes mellitus without complications: Secondary | ICD-10-CM | POA: Diagnosis not present

## 2014-01-28 DIAGNOSIS — Z8781 Personal history of (healed) traumatic fracture: Secondary | ICD-10-CM | POA: Insufficient documentation

## 2014-01-28 DIAGNOSIS — Z590 Homelessness: Secondary | ICD-10-CM | POA: Diagnosis not present

## 2014-01-28 DIAGNOSIS — Z8659 Personal history of other mental and behavioral disorders: Secondary | ICD-10-CM | POA: Diagnosis not present

## 2014-01-28 DIAGNOSIS — R109 Unspecified abdominal pain: Secondary | ICD-10-CM | POA: Insufficient documentation

## 2014-01-28 DIAGNOSIS — R079 Chest pain, unspecified: Secondary | ICD-10-CM

## 2014-01-28 DIAGNOSIS — J449 Chronic obstructive pulmonary disease, unspecified: Secondary | ICD-10-CM | POA: Diagnosis not present

## 2014-01-28 NOTE — ED Notes (Signed)
Bed: [facility] Expected date:  Expected time:  Means of arrival:  Comments: EMS back pain, bronchitis

## 2014-01-28 NOTE — ED Notes (Signed)
Pt reports chronic back pain and hx of bronchitis. Denies nausea at this time. Pt states he came tonight "because I was too tired and hungry to stay outside in the cold." Denies any new complaints.
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
## 2014-01-29 ENCOUNTER — Emergency Department (HOSPITAL_COMMUNITY): Payer: Medicare Other

## 2014-01-29 LAB — CBC WITH DIFFERENTIAL/PLATELET
BASOS ABS: 0 10*3/uL (ref 0.0–0.1)
Basophils Relative: 1 % (ref 0–1)
EOS PCT: 6 % — AB (ref 0–5)
Eosinophils Absolute: 0.5 10*3/uL (ref 0.0–0.7)
HEMATOCRIT: 35.3 % — AB (ref 39.0–52.0)
HEMOGLOBIN: 11 g/dL — AB (ref 13.0–17.0)
LYMPHS PCT: 22 % (ref 12–46)
Lymphs Abs: 1.7 10*3/uL (ref 0.7–4.0)
MCH: 26.8 pg (ref 26.0–34.0)
MCHC: 31.2 g/dL (ref 30.0–36.0)
MCV: 86.1 fL (ref 78.0–100.0)
MONOS PCT: 8 % (ref 3–12)
Monocytes Absolute: 0.6 10*3/uL (ref 0.1–1.0)
NEUTROS ABS: 5.2 10*3/uL (ref 1.7–7.7)
Neutrophils Relative %: 65 % (ref 43–77)
Platelets: 327 10*3/uL (ref 150–400)
RBC: 4.1 MIL/uL — AB (ref 4.22–5.81)
RDW: 17 % — AB (ref 11.5–15.5)
WBC: 8 10*3/uL (ref 4.0–10.5)

## 2014-01-29 LAB — COMPREHENSIVE METABOLIC PANEL
ALT: 11 U/L (ref 0–53)
AST: 21 U/L (ref 0–37)
Albumin: 3.4 g/dL — ABNORMAL LOW (ref 3.5–5.2)
Alkaline Phosphatase: 122 U/L — ABNORMAL HIGH (ref 39–117)
Anion gap: 13 (ref 5–15)
BILIRUBIN TOTAL: 0.2 mg/dL — AB (ref 0.3–1.2)
BUN: 28 mg/dL — ABNORMAL HIGH (ref 6–23)
CALCIUM: 9.2 mg/dL (ref 8.4–10.5)
CHLORIDE: 102 meq/L (ref 96–112)
CO2: 22 meq/L (ref 19–32)
CREATININE: 1 mg/dL (ref 0.50–1.35)
GFR, EST AFRICAN AMERICAN: 79 mL/min — AB (ref >90)
GFR, EST NON AFRICAN AMERICAN: 68 mL/min — AB (ref >90)
GLUCOSE: 99 mg/dL (ref 70–99)
Potassium: 4.1 mEq/L (ref 3.7–5.3)
Sodium: 137 mEq/L (ref 137–147)
Total Protein: 7.1 g/dL (ref 6.0–8.3)

## 2014-01-29 LAB — TROPONIN I: Troponin I: <0.3 ng/mL (ref <0.30)

## 2014-01-29 LAB — PRO B NATRIURETIC PEPTIDE: Pro B Natriuretic peptide (BNP): 629.3 pg/mL — ABNORMAL HIGH (ref 0–450)

## 2014-01-29 MED ORDER — LEVOFLOXACIN IN D5W 500 MG/100ML IV SOLN
500.0000 mg | Freq: Once | INTRAVENOUS | Status: AC
  Administered 2014-01-29: 500 mg via INTRAVENOUS
  Filled 2014-01-29: qty 100

## 2014-01-29 MED ORDER — HYDROCODONE-ACETAMINOPHEN 5-325 MG PO TABS
1.0000 | ORAL_TABLET | Freq: Once | ORAL | Status: AC
  Administered 2014-01-29: 1 via ORAL
  Filled 2014-01-29: qty 1

## 2014-01-29 MED ORDER — ONDANSETRON 4 MG PO TBDP
4.0000 mg | ORAL_TABLET | Freq: Once | ORAL | Status: AC
  Administered 2014-01-29: 4 mg via ORAL
  Filled 2014-01-29: qty 1

## 2014-01-29 MED ORDER — LEVOFLOXACIN 750 MG PO TABS: 750.0000 mg | ORAL_TABLET | Freq: Every day | ORAL | Status: DC

## 2014-01-29 NOTE — Progress Notes (Signed)
CARE MANAGEMENT NOTE 01/29/2014  Patient:  ABUNDIO, TEUSCHER   Account Number:  [id number]  Date Initiated:  01/29/2014  Documentation initiated by:  [facility]  Subjective/Objective Assessment:   CAP     Action/Plan:   homeless   Anticipated DC Date:  01/29/2014   Anticipated DC Plan:  [place]  CM consult  Medication Assistance      Choice offered to / List presented to:             Status of service:  Completed, signed off Medicare Important Message given?   (If response is "NO", the following Medicare IM given date fields will be blank) Date Medicare IM given:   Medicare IM given by:   Date Additional Medicare IM given:   Additional Medicare IM given by:    Discharge Disposition:  HOME/SELF CARE  Per UR Regulation:    If discussed at Long Length of Stay Meetings, dates discussed:    Comments:  01/29/2014 1100 NCM spoke to pt and states he gets his meds from the [place]. He also get a SS check of $1300 each month. Pt had money but states they money does not belong to him. Pt given Rx to take to pharmacy to fill. Call to [facility] and Levaquin $ 10.90.  Instructed pt to go to [facility] at [facility].   Jonnie Finner RN CCM Case Mgmt phone [phone]

## 2014-01-29 NOTE — ED Notes (Signed)
Pt was found to be smoking in room. Pt was moved to Dale Mcfarland so that he can be monitored until CM can bring his rx'd meds

## 2014-01-29 NOTE — ED Notes (Signed)
Pt sts that he has no way of getting rx filled. Charge RN Chong Sicilian is talking with social work to see about getting pt meds before d/c

## 2014-01-29 NOTE — ED Notes (Signed)
Pt is being allowed to eat breakfast before being d/c

## 2014-01-29 NOTE — ED Notes (Signed)
Pt has a $20.00 bill in pocket, when told he was not able to receive help with meds he stated it is not his money. RN explained he can use the bill to buy his meds and then repay whom ever. Pt became agitated, got up and left without signing discharge.
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
## 2014-01-29 NOTE — Discharge Instructions (Signed)
Pneumonia, Adult  Pneumonia is an infection of the lungs. It may be caused by a germ (virus or bacteria). Some types of pneumonia can spread easily from person to person. This can happen when you cough or sneeze.  HOME CARE   Only take medicine as told by your doctor.   Take your medicine (antibiotics) as told. Finish it even if you start to feel better.   Do not smoke.   You may use a vaporizer or humidifier in your room. This can help loosen thick spit (mucus).   Sleep so you are almost sitting up (semi-upright). This helps reduce coughing.   Rest.  A shot (vaccine) can help prevent pneumonia. Shots are often advised for:   People over 65 years old.   Patients on chemotherapy.   People with long-term (chronic) lung problems.   People with immune system problems.  GET HELP RIGHT AWAY IF:    You are getting worse.   You cannot control your cough, and you are losing sleep.   You cough up blood.   Your pain gets worse, even with medicine.   You have a fever.   Any of your problems are getting worse, not better.   You have shortness of breath or chest pain.  MAKE SURE YOU:    Understand these instructions.   Will watch your condition.   Will get help right away if you are not doing well or get worse.  Document Released: 07/17/2007 Document Revised: 04/22/2011 Document Reviewed: 04/20/2010  Exit[employer] Patient Information 2015 [employer]. This information is not intended to replace advice given to you by your health care provider. Make sure you discuss any questions you have with your health care provider.
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
## 2014-02-16 ENCOUNTER — Inpatient Hospital Stay (HOSPITAL_COMMUNITY)
Admission: EM | Admit: 2014-02-16 | Discharge: 2014-02-21 | DRG: 308 | Disposition: A | Discharge status: Home / Self Care | Payer: Medicare Other | Attending: Internal Medicine | Admitting: Internal Medicine

## 2014-02-16 ENCOUNTER — Inpatient Hospital Stay (HOSPITAL_COMMUNITY): Payer: Medicare Other

## 2014-02-16 ENCOUNTER — Encounter (HOSPITAL_COMMUNITY): Payer: Self-pay | Admitting: Emergency Medicine

## 2014-02-16 ENCOUNTER — Emergency Department (HOSPITAL_COMMUNITY): Payer: Medicare Other

## 2014-02-16 DIAGNOSIS — Z590 Homelessness: Secondary | ICD-10-CM | POA: Diagnosis not present

## 2014-02-16 DIAGNOSIS — J441 Chronic obstructive pulmonary disease with (acute) exacerbation: Secondary | ICD-10-CM | POA: Diagnosis present

## 2014-02-16 DIAGNOSIS — D509 Iron deficiency anemia, unspecified: Secondary | ICD-10-CM | POA: Diagnosis present

## 2014-02-16 DIAGNOSIS — F329 Major depressive disorder, single episode, unspecified: Secondary | ICD-10-CM | POA: Diagnosis present

## 2014-02-16 DIAGNOSIS — G8929 Other chronic pain: Secondary | ICD-10-CM | POA: Diagnosis present

## 2014-02-16 DIAGNOSIS — E43 Unspecified severe protein-calorie malnutrition: Secondary | ICD-10-CM | POA: Diagnosis present

## 2014-02-16 DIAGNOSIS — D638 Anemia in other chronic diseases classified elsewhere: Secondary | ICD-10-CM | POA: Diagnosis present

## 2014-02-16 DIAGNOSIS — Z888 Allergy status to other drugs, medicaments and biological substances status: Secondary | ICD-10-CM

## 2014-02-16 DIAGNOSIS — Z880 Allergy status to penicillin: Secondary | ICD-10-CM

## 2014-02-16 DIAGNOSIS — I10 Essential (primary) hypertension: Secondary | ICD-10-CM | POA: Diagnosis present

## 2014-02-16 DIAGNOSIS — Z6820 Body mass index (BMI) 20.0-20.9, adult: Secondary | ICD-10-CM

## 2014-02-16 DIAGNOSIS — Z8249 Family history of ischemic heart disease and other diseases of the circulatory system: Secondary | ICD-10-CM | POA: Diagnosis not present

## 2014-02-16 DIAGNOSIS — R1110 Vomiting, unspecified: Secondary | ICD-10-CM

## 2014-02-16 DIAGNOSIS — I4892 Unspecified atrial flutter: Principal | ICD-10-CM | POA: Diagnosis present

## 2014-02-16 DIAGNOSIS — I4891 Unspecified atrial fibrillation: Secondary | ICD-10-CM | POA: Diagnosis present

## 2014-02-16 DIAGNOSIS — Z720 Tobacco use: Secondary | ICD-10-CM

## 2014-02-16 LAB — BASIC METABOLIC PANEL
ANION GAP: 9 (ref 5–15)
BUN: 28 mg/dL — ABNORMAL HIGH (ref 6–23)
CHLORIDE: 103 meq/L (ref 96–112)
CO2: 22 mmol/L (ref 19–32)
CREATININE: 1.16 mg/dL (ref 0.50–1.35)
Calcium: 8.8 mg/dL (ref 8.4–10.5)
GFR calc non Af Amer: 57 mL/min — ABNORMAL LOW (ref >90)
GFR, EST AFRICAN AMERICAN: 66 mL/min — AB (ref >90)
GLUCOSE: 81 mg/dL (ref 70–99)
Potassium: 4.1 mmol/L (ref 3.5–5.1)
SODIUM: 134 mmol/L — AB (ref 135–145)

## 2014-02-16 LAB — CBC WITH DIFFERENTIAL/PLATELET
Basophils Absolute: 0 10*3/uL (ref 0.0–0.1)
Basophils Relative: 0 % (ref 0–1)
EOS PCT: 2 % (ref 0–5)
Eosinophils Absolute: 0.1 10*3/uL (ref 0.0–0.7)
HCT: 34.2 % — ABNORMAL LOW (ref 39.0–52.0)
HEMOGLOBIN: 10.9 g/dL — AB (ref 13.0–17.0)
LYMPHS ABS: 1.4 10*3/uL (ref 0.7–4.0)
Lymphocytes Relative: 17 % (ref 12–46)
MCH: 27.3 pg (ref 26.0–34.0)
MCHC: 31.9 g/dL (ref 30.0–36.0)
MCV: 85.7 fL (ref 78.0–100.0)
Monocytes Absolute: 0.8 10*3/uL (ref 0.1–1.0)
Monocytes Relative: 9 % (ref 3–12)
NEUTROS ABS: 5.9 10*3/uL (ref 1.7–7.7)
Neutrophils Relative %: 72 % (ref 43–77)
Platelets: 340 10*3/uL (ref 150–400)
RBC: 3.99 MIL/uL — AB (ref 4.22–5.81)
RDW: 16.4 % — ABNORMAL HIGH (ref 11.5–15.5)
WBC: 8.2 10*3/uL (ref 4.0–10.5)

## 2014-02-16 LAB — MAGNESIUM: Magnesium: 1.9 mg/dL (ref 1.5–2.5)

## 2014-02-16 LAB — I-STAT TROPONIN, ED: Troponin i, poc: 0 ng/mL (ref 0.00–0.08)

## 2014-02-16 LAB — BRAIN NATRIURETIC PEPTIDE: B Natriuretic Peptide: 112.7 pg/mL — ABNORMAL HIGH (ref 0.0–100.0)

## 2014-02-16 MED ORDER — VITAMIN B-1 100 MG PO TABS
100.0000 mg | ORAL_TABLET | Freq: Every day | ORAL | Status: DC
  Administered 2014-02-17 – 2014-02-21 (×5): 100 mg via ORAL
  Filled 2014-02-16 (×6): qty 1

## 2014-02-16 MED ORDER — METHYLPREDNISOLONE SODIUM SUCC 125 MG IJ SOLR
125.0000 mg | Freq: Once | INTRAMUSCULAR | Status: AC
  Administered 2014-02-16: 125 mg via INTRAVENOUS
  Filled 2014-02-16: qty 2

## 2014-02-16 MED ORDER — ONDANSETRON HCL 4 MG/2ML IJ SOLN: 4.0000 mg | Freq: Four times a day (QID) | INTRAMUSCULAR | Status: DC | PRN

## 2014-02-16 MED ORDER — DILTIAZEM HCL 25 MG/5ML IV SOLN
10.0000 mg | Freq: Once | INTRAVENOUS | Status: AC
  Administered 2014-02-16: 10 mg via INTRAVENOUS
  Filled 2014-02-16: qty 5

## 2014-02-16 MED ORDER — PANTOPRAZOLE SODIUM 40 MG IV SOLR
40.0000 mg | Freq: Two times a day (BID) | INTRAVENOUS | Status: DC
  Administered 2014-02-16 – 2014-02-19 (×6): 40 mg via INTRAVENOUS
  Filled 2014-02-16 (×7): qty 40

## 2014-02-16 MED ORDER — ALBUTEROL (5 MG/ML) CONTINUOUS INHALATION SOLN
15.0000 mg/h | INHALATION_SOLUTION | RESPIRATORY_TRACT | Status: DC
  Administered 2014-02-16: 15 mg/h via RESPIRATORY_TRACT
  Filled 2014-02-16: qty 20

## 2014-02-16 MED ORDER — NICOTINE 7 MG/24HR TD PT24
7.0000 mg | MEDICATED_PATCH | Freq: Every day | TRANSDERMAL | Status: DC
  Administered 2014-02-16 – 2014-02-19 (×4): 7 mg via TRANSDERMAL
  Filled 2014-02-16 (×6): qty 1

## 2014-02-16 MED ORDER — PROMETHAZINE HCL 25 MG/ML IJ SOLN
12.5000 mg | INTRAMUSCULAR | Status: DC | PRN
  Administered 2014-02-16: 12.5 mg via INTRAVENOUS
  Filled 2014-02-16: qty 1

## 2014-02-16 MED ORDER — HYDROCODONE-ACETAMINOPHEN 5-325 MG PO TABS: 1.0000 | ORAL_TABLET | ORAL | Status: DC | PRN

## 2014-02-16 MED ORDER — ONDANSETRON HCL 4 MG PO TABS: 4.0000 mg | ORAL_TABLET | Freq: Four times a day (QID) | ORAL | Status: DC | PRN

## 2014-02-16 MED ORDER — GUAIFENESIN-DM 100-10 MG/5ML PO SYRP: 5.0000 mL | ORAL_SOLUTION | ORAL | Status: DC | PRN

## 2014-02-16 MED ORDER — DILTIAZEM LOAD VIA INFUSION
10.0000 mg | Freq: Once | INTRAVENOUS | Status: DC
  Filled 2014-02-16: qty 10

## 2014-02-16 MED ORDER — GUAIFENESIN ER 600 MG PO TB12
600.0000 mg | ORAL_TABLET | Freq: Two times a day (BID) | ORAL | Status: DC | PRN
  Filled 2014-02-16: qty 1

## 2014-02-16 MED ORDER — LEVALBUTEROL HCL 0.63 MG/3ML IN NEBU
0.6300 mg | INHALATION_SOLUTION | Freq: Four times a day (QID) | RESPIRATORY_TRACT | Status: DC
  Administered 2014-02-16: 0.63 mg via RESPIRATORY_TRACT
  Filled 2014-02-16: qty 3

## 2014-02-16 MED ORDER — ACETAMINOPHEN 500 MG PO TABS
1000.0000 mg | ORAL_TABLET | Freq: Once | ORAL | Status: AC
  Administered 2014-02-16: 1000 mg via ORAL
  Filled 2014-02-16: qty 2

## 2014-02-16 MED ORDER — DILTIAZEM HCL 100 MG IV SOLR
5.0000 mg/h | Freq: Once | INTRAVENOUS | Status: AC
  Administered 2014-02-16: 5 mg/h via INTRAVENOUS

## 2014-02-16 MED ORDER — LEVALBUTEROL HCL 0.63 MG/3ML IN NEBU
0.6300 mg | INHALATION_SOLUTION | Freq: Three times a day (TID) | RESPIRATORY_TRACT | Status: DC
  Administered 2014-02-17 – 2014-02-18 (×4): 0.63 mg via RESPIRATORY_TRACT
  Filled 2014-02-16 (×4): qty 3

## 2014-02-16 MED ORDER — SODIUM CHLORIDE 0.9 % IV SOLN
INTRAVENOUS | Status: DC
  Administered 2014-02-16: 10:00:00 via INTRAVENOUS

## 2014-02-16 MED ORDER — ONDANSETRON HCL 4 MG/2ML IJ SOLN
4.0000 mg | INTRAMUSCULAR | Status: DC | PRN
  Administered 2014-02-16: 4 mg via INTRAVENOUS
  Filled 2014-02-16: qty 2

## 2014-02-16 MED ORDER — METHYLPREDNISOLONE SODIUM SUCC 40 MG IJ SOLR: 40.0000 mg | Freq: Every day | INTRAMUSCULAR | Status: DC

## 2014-02-16 MED ORDER — ENOXAPARIN SODIUM 40 MG/0.4ML ~~LOC~~ SOLN
40.0000 mg | SUBCUTANEOUS | Status: DC
  Administered 2014-02-16 – 2014-02-20 (×5): 40 mg via SUBCUTANEOUS
  Filled 2014-02-16 (×7): qty 0.4

## 2014-02-16 MED ORDER — AMLODIPINE BESYLATE 5 MG PO TABS
5.0000 mg | ORAL_TABLET | Freq: Every day | ORAL | Status: DC
  Administered 2014-02-17 – 2014-02-21 (×5): 5 mg via ORAL
  Filled 2014-02-16 (×6): qty 1

## 2014-02-16 MED ORDER — ONDANSETRON HCL 4 MG PO TABS: 4.0000 mg | ORAL_TABLET | ORAL | Status: DC | PRN

## 2014-02-16 MED ORDER — DILTIAZEM HCL 100 MG IV SOLR: 5.0000 mg/h | INTRAVENOUS | Status: DC

## 2014-02-16 MED ORDER — ASPIRIN 81 MG PO CHEW
324.0000 mg | CHEWABLE_TABLET | Freq: Once | ORAL | Status: AC
  Administered 2014-02-16: 324 mg via ORAL
  Filled 2014-02-16: qty 4

## 2014-02-16 MED ORDER — LEVALBUTEROL HCL 0.63 MG/3ML IN NEBU
0.6300 mg | INHALATION_SOLUTION | Freq: Four times a day (QID) | RESPIRATORY_TRACT | Status: DC | PRN
  Administered 2014-02-16: 0.63 mg via RESPIRATORY_TRACT
  Filled 2014-02-16: qty 3

## 2014-02-16 MED ORDER — FOLIC ACID 1 MG PO TABS
1.0000 mg | ORAL_TABLET | Freq: Every day | ORAL | Status: DC
  Administered 2014-02-17 – 2014-02-21 (×5): 1 mg via ORAL
  Filled 2014-02-16 (×6): qty 1

## 2014-02-16 MED ORDER — SODIUM CHLORIDE 0.9 % IJ SOLN
3.0000 mL | Freq: Two times a day (BID) | INTRAMUSCULAR | Status: DC
  Administered 2014-02-16 – 2014-02-21 (×8): 3 mL via INTRAVENOUS
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
## 2014-02-16 NOTE — Progress Notes (Signed)
Patient in sinus rhythm w HR 90-100s on arrival to floor.  Cardizem d/c'ed per Dr. Doyle Askew. Patient bed alarm on, patient resting in bed, telemetry confirmed with CMT Renee.  Will continue to monitor.
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
## 2014-02-16 NOTE — Progress Notes (Signed)
Patient had several large episodes of coffee ground emesis.  It was all over the floor and the patient's bed.  Patient's vitals are stable and patient is actually more alert/less confused now than on arrival to the floor.  Have notified Dr. Doyle Askew.  Will continue to monitor.

## 2014-02-16 NOTE — ED Notes (Signed)
Checked on patient HR 200, pt states just vomited. Dr. Leonides Schanz notified, new EKG preformed.
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
## 2014-02-16 NOTE — ED Notes (Signed)
Albuterol neb stopped due to patients heart rate150. New EKG done. Dr. Leonides Schanz notified.

## 2014-02-16 NOTE — Progress Notes (Signed)
Patient had another 2 episodes of coffee ground emesis.  Vitals and HR still stable.  Notified Dr. Doyle Askew, new orders received.  Patient in and out of confusion, agitated at times and cooperative at others.  Will continue to monitor.

## 2014-02-16 NOTE — ED Notes (Signed)
Bed: AY04 Expected date:  Expected time:  Means of arrival:  Comments: 81 y/o M leg pain homeless

## 2014-02-17 DIAGNOSIS — I4891 Unspecified atrial fibrillation: Secondary | ICD-10-CM

## 2014-02-17 LAB — CBC
HCT: 30.2 % — ABNORMAL LOW (ref 39.0–52.0)
Hemoglobin: 9.5 g/dL — ABNORMAL LOW (ref 13.0–17.0)
MCH: 27.1 pg (ref 26.0–34.0)
MCHC: 31.5 g/dL (ref 30.0–36.0)
MCV: 86 fL (ref 78.0–100.0)
PLATELETS: 308 10*3/uL (ref 150–400)
RBC: 3.51 MIL/uL — ABNORMAL LOW (ref 4.22–5.81)
RDW: 16.8 % — AB (ref 11.5–15.5)
WBC: 6.6 10*3/uL (ref 4.0–10.5)

## 2014-02-17 LAB — BASIC METABOLIC PANEL
Anion gap: 8 (ref 5–15)
BUN: 30 mg/dL — AB (ref 6–23)
CO2: 23 mmol/L (ref 19–32)
CREATININE: 1.06 mg/dL (ref 0.50–1.35)
Calcium: 8.8 mg/dL (ref 8.4–10.5)
Chloride: 107 mEq/L (ref 96–112)
GFR calc Af Amer: 73 mL/min — ABNORMAL LOW (ref >90)
GFR calc non Af Amer: 63 mL/min — ABNORMAL LOW (ref >90)
Glucose, Bld: 114 mg/dL — ABNORMAL HIGH (ref 70–99)
Potassium: 4.3 mmol/L (ref 3.5–5.1)
Sodium: 138 mmol/L (ref 135–145)

## 2014-02-17 MED ORDER — SODIUM CHLORIDE 0.9 % IV SOLN
INTRAVENOUS | Status: DC
  Administered 2014-02-17 – 2014-02-18 (×2): via INTRAVENOUS

## 2014-02-17 MED ORDER — METHYLPREDNISOLONE SODIUM SUCC 40 MG IJ SOLR
40.0000 mg | Freq: Two times a day (BID) | INTRAMUSCULAR | Status: DC
Start: 2014-02-17 — End: 2014-02-19
  Administered 2014-02-17 – 2014-02-19 (×4): 40 mg via INTRAVENOUS
  Filled 2014-02-17 (×5): qty 1

## 2014-02-17 NOTE — Care Management Note (Addendum)
    Page 1 of 1   02/21/2014     2:58:48 PM CARE MANAGEMENT NOTE 02/21/2014  Patient:  Dale Mcfarland, Dale Mcfarland   Account Number:  [id number]  Date Initiated:  02/17/2014  Documentation initiated by:  Dessa Phi  Subjective/Objective Assessment:   81 y/o m admitted w/afib w/rvr.EX:NTZG.     Action/Plan:   Homeless.   Anticipated DC Date:  02/21/2014   Anticipated DC Plan:  HOMELESS [facility]LTER  In-house referral  Clinical Social Worker      DC Planning Services  CM consult  Medication Assistance      Choice offered to / List presented to:             Status of service:  Completed, signed off Medicare Important Message given?  YES (If response is "NO", the following Medicare IM given date fields will be blank) Date Medicare IM given:  02/18/2014 Medicare IM given by:  [facility] Date Additional Medicare IM given:  02/21/2014 Additional Medicare IM given by:  Sunday Spillers  Discharge Disposition:  HOME/SELF CARE  Per UR Regulation:  Reviewed for med. necessity/level of care/duration of stay  If discussed at [place] of Stay Meetings, dates discussed:    Comments:  02-21-13 Sunday Spillers RN CM 1500 Spoke with patient at bedside, he states he has medication coverage and is able to get his medication without issue.  02/17/14 Dessa Phi RN BSN NCM [phone] See ED DM note.Patient gets meds from [place].CSW following for return to shelter.
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
## 2014-02-18 LAB — BASIC METABOLIC PANEL
Anion gap: 6 (ref 5–15)
BUN: 22 mg/dL (ref 6–23)
CALCIUM: 8.5 mg/dL (ref 8.4–10.5)
CHLORIDE: 107 meq/L (ref 96–112)
CO2: 24 mmol/L (ref 19–32)
CREATININE: 1.07 mg/dL (ref 0.50–1.35)
GFR calc non Af Amer: 63 mL/min — ABNORMAL LOW (ref >90)
GFR, EST AFRICAN AMERICAN: 73 mL/min — AB (ref >90)
Glucose, Bld: 135 mg/dL — ABNORMAL HIGH (ref 70–99)
Potassium: 4.5 mmol/L (ref 3.5–5.1)
SODIUM: 137 mmol/L (ref 135–145)

## 2014-02-18 LAB — CBC
HEMATOCRIT: 29 % — AB (ref 39.0–52.0)
Hemoglobin: 9.1 g/dL — ABNORMAL LOW (ref 13.0–17.0)
MCH: 27 pg (ref 26.0–34.0)
MCHC: 31.4 g/dL (ref 30.0–36.0)
MCV: 86.1 fL (ref 78.0–100.0)
PLATELETS: 253 10*3/uL (ref 150–400)
RBC: 3.37 MIL/uL — ABNORMAL LOW (ref 4.22–5.81)
RDW: 16.9 % — AB (ref 11.5–15.5)
WBC: 8.2 10*3/uL (ref 4.0–10.5)

## 2014-02-18 MED ORDER — DEXTROSE 5 % IV SOLN
500.0000 mg | INTRAVENOUS | Status: DC
  Administered 2014-02-18 – 2014-02-19 (×2): 500 mg via INTRAVENOUS
  Filled 2014-02-18 (×2): qty 500

## 2014-02-18 MED ORDER — ENSURE COMPLETE PO LIQD
237.0000 mL | Freq: Two times a day (BID) | ORAL | Status: DC
  Administered 2014-02-18 – 2014-02-20 (×4): 237 mL via ORAL
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
## 2014-02-18 NOTE — Progress Notes (Signed)
Pt had 5 beats of Vtach. Pt asymptomatic. Vital signs stable. MD made aware. No further orders at this time. Will continue to monitor pt.  Othella Boyer [facility] 02/18/2014 9:21 AM
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
## 2014-02-19 DIAGNOSIS — E43 Unspecified severe protein-calorie malnutrition: Secondary | ICD-10-CM | POA: Insufficient documentation

## 2014-02-19 LAB — CBC
HEMATOCRIT: 30.7 % — AB (ref 39.0–52.0)
Hemoglobin: 9.6 g/dL — ABNORMAL LOW (ref 13.0–17.0)
MCH: 27.1 pg (ref 26.0–34.0)
MCHC: 31.3 g/dL (ref 30.0–36.0)
MCV: 86.7 fL (ref 78.0–100.0)
Platelets: 274 10*3/uL (ref 150–400)
RBC: 3.54 MIL/uL — ABNORMAL LOW (ref 4.22–5.81)
RDW: 16.6 % — ABNORMAL HIGH (ref 11.5–15.5)
WBC: 10.2 10*3/uL (ref 4.0–10.5)

## 2014-02-19 LAB — BASIC METABOLIC PANEL
ANION GAP: 7 (ref 5–15)
BUN: 29 mg/dL — AB (ref 6–23)
CHLORIDE: 107 meq/L (ref 96–112)
CO2: 24 mmol/L (ref 19–32)
Calcium: 8.6 mg/dL (ref 8.4–10.5)
Creatinine, Ser: 1.08 mg/dL (ref 0.50–1.35)
GFR calc non Af Amer: 62 mL/min — ABNORMAL LOW (ref >90)
GFR, EST AFRICAN AMERICAN: 72 mL/min — AB (ref >90)
Glucose, Bld: 125 mg/dL — ABNORMAL HIGH (ref 70–99)
POTASSIUM: 4 mmol/L (ref 3.5–5.1)
Sodium: 138 mmol/L (ref 135–145)

## 2014-02-19 MED ORDER — LORAZEPAM 2 MG/ML IJ SOLN
1.0000 mg | Freq: Once | INTRAMUSCULAR | Status: AC
  Administered 2014-02-19: 1 mg via INTRAVENOUS
  Filled 2014-02-19: qty 1

## 2014-02-19 MED ORDER — PREDNISONE 50 MG PO TABS
50.0000 mg | ORAL_TABLET | Freq: Every day | ORAL | Status: DC
  Administered 2014-02-20: 50 mg via ORAL
  Filled 2014-02-19: qty 1

## 2014-02-19 MED ORDER — AZITHROMYCIN 500 MG PO TABS
500.0000 mg | ORAL_TABLET | Freq: Every day | ORAL | Status: DC
  Administered 2014-02-20 – 2014-02-21 (×2): 500 mg via ORAL
  Filled 2014-02-19 (×2): qty 1

## 2014-02-19 MED ORDER — PANTOPRAZOLE SODIUM 40 MG PO TBEC
40.0000 mg | DELAYED_RELEASE_TABLET | Freq: Two times a day (BID) | ORAL | Status: DC
  Administered 2014-02-19 – 2014-02-21 (×4): 40 mg via ORAL
  Filled 2014-02-19 (×6): qty 1
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
## 2014-02-19 NOTE — Progress Notes (Signed)
PHARMACIST - PHYSICIAN COMMUNICATION DR:   Doyle Askew CONCERNING: Antibiotic IV to Oral Route Change Policy  RECOMMENDATION: This patient is receiving Zitrhomax by the intravenous route.  Based on criteria approved by the Pharmacy and Therapeutics Committee, the antibiotic(s) is/are being converted to the equivalent oral dose form(s).   DESCRIPTION: These criteria include:  Patient being treated for a respiratory tract infection, urinary tract infection, cellulitis or clostridium difficile associated diarrhea if on metronidazole  The patient is not neutropenic and does not exhibit a GI malabsorption state  The patient is eating (either orally or via tube) and/or has been taking other orally administered medications for a least 24 hours  The patient is improving clinically and has a Tmax < 100.5  If you have questions about this conversion, please contact the Pharmacy Department  []   [phone] )  [facility] Na []   [phone] )  [facility]  []   [phone] )  [facility] [x]   [phone] )  [facility]   Key Points: Use following P&T approved IV to PO medication change policy.  Description contains the criteria that are approved Note: Policy Excludes:  Esophagectomy patientsPHARMACIST - PHYSICIAN COMMUNICATION  RECOMMENDATION: This patient is receiving protonix by the intravenous route.  Based on criteria approved by the Pharmacy and Therapeutics Committee, the intravenous medication(s) is/are being converted to the equivalent oral dose form(s).   DESCRIPTION: These criteria include:  The patient is eating (either orally or via tube) and/or has been taking other orally administered medications for a least 24 hours  The patient has no evidence of active gastrointestinal bleeding or impaired GI absorption (gastrectomy, short bowel, patient on TNA or NPO).  If you have questions about this conversion, please contact the Pharmacy Department  []   [phone] )  [facility]stine Na []   [phone] )  [facility] Pontes  []   [phone] )  [facility] [x]   [phone] )  [place], [place], [facility] 02/19/2014 12:29 PM

## 2014-02-20 LAB — BASIC METABOLIC PANEL
Anion gap: 0 — ABNORMAL LOW (ref 5–15)
BUN: 39 mg/dL — AB (ref 6–23)
CHLORIDE: 106 meq/L (ref 96–112)
CO2: 29 mmol/L (ref 19–32)
CREATININE: 1.21 mg/dL (ref 0.50–1.35)
Calcium: 8 mg/dL — ABNORMAL LOW (ref 8.4–10.5)
GFR calc Af Amer: 63 mL/min — ABNORMAL LOW (ref >90)
GFR calc non Af Amer: 54 mL/min — ABNORMAL LOW (ref >90)
Glucose, Bld: 94 mg/dL (ref 70–99)
POTASSIUM: 4.1 mmol/L (ref 3.5–5.1)
Sodium: 135 mmol/L (ref 135–145)

## 2014-02-20 LAB — CBC
HCT: 30.6 % — ABNORMAL LOW (ref 39.0–52.0)
Hemoglobin: 9.8 g/dL — ABNORMAL LOW (ref 13.0–17.0)
MCH: 27.7 pg (ref 26.0–34.0)
MCHC: 32 g/dL (ref 30.0–36.0)
MCV: 86.4 fL (ref 78.0–100.0)
PLATELETS: 255 10*3/uL (ref 150–400)
RBC: 3.54 MIL/uL — AB (ref 4.22–5.81)
RDW: 16.4 % — AB (ref 11.5–15.5)
WBC: 9.9 10*3/uL (ref 4.0–10.5)

## 2014-02-20 MED ORDER — PREDNISONE 20 MG PO TABS
40.0000 mg | ORAL_TABLET | Freq: Every day | ORAL | Status: DC
  Administered 2014-02-21: 40 mg via ORAL
  Filled 2014-02-20 (×2): qty 2
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
## 2014-02-21 LAB — CBC
HCT: 33.4 % — ABNORMAL LOW (ref 39.0–52.0)
Hemoglobin: 10.3 g/dL — ABNORMAL LOW (ref 13.0–17.0)
MCH: 26.9 pg (ref 26.0–34.0)
MCHC: 30.8 g/dL (ref 30.0–36.0)
MCV: 87.2 fL (ref 78.0–100.0)
Platelets: 294 10*3/uL (ref 150–400)
RBC: 3.83 MIL/uL — ABNORMAL LOW (ref 4.22–5.81)
RDW: 16.6 % — ABNORMAL HIGH (ref 11.5–15.5)
WBC: 10.2 10*3/uL (ref 4.0–10.5)

## 2014-02-21 LAB — BASIC METABOLIC PANEL
ANION GAP: 7 (ref 5–15)
BUN: 41 mg/dL — AB (ref 6–23)
CALCIUM: 8.6 mg/dL (ref 8.4–10.5)
CO2: 28 mmol/L (ref 19–32)
CREATININE: 1.21 mg/dL (ref 0.50–1.35)
Chloride: 105 mEq/L (ref 96–112)
GFR calc Af Amer: 63 mL/min — ABNORMAL LOW (ref >90)
GFR calc non Af Amer: 54 mL/min — ABNORMAL LOW (ref >90)
GLUCOSE: 103 mg/dL — AB (ref 70–99)
Potassium: 4.2 mmol/L (ref 3.5–5.1)
SODIUM: 140 mmol/L (ref 135–145)

## 2014-02-21 MED ORDER — AZITHROMYCIN 250 MG PO TABS
250.0000 mg | ORAL_TABLET | Freq: Every day | ORAL | Status: DC
Start: 2014-02-21 — End: 2014-03-01

## 2014-02-21 MED ORDER — HYDROCODONE-ACETAMINOPHEN 5-325 MG PO TABS: 1.0000 | ORAL_TABLET | ORAL | Status: DC | PRN

## 2014-02-21 MED ORDER — PREDNISONE 10 MG PO TABS: ORAL_TABLET | ORAL | Status: DC
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
## 2014-02-21 NOTE — Progress Notes (Signed)
Pt d/c prior to being seen by CSW. NSG provided resources ( provided by CSW ) for [facility] / Shelters/ Buss pass.   Werner Lean LCSW [phone]

## 2014-02-21 NOTE — Progress Notes (Signed)
Discharge instructions reviewed with patient, vital signs are stable, prescriptions given and bus pass, patient instructed to go to shelter center Neta Mends RN

## 2014-02-21 NOTE — Discharge Instructions (Signed)

## 2014-02-24 ENCOUNTER — Encounter (HOSPITAL_COMMUNITY): Payer: Self-pay | Admitting: Gastroenterology

## 2014-02-27 ENCOUNTER — Other Ambulatory Visit: Payer: Self-pay

## 2014-02-27 ENCOUNTER — Emergency Department (HOSPITAL_COMMUNITY)
Admission: EM | Admit: 2014-02-27 | Discharge: 2014-02-27 | Disposition: A | Discharge status: Home / Self Care | Payer: Medicare Other | Attending: Emergency Medicine | Admitting: Emergency Medicine

## 2014-02-27 ENCOUNTER — Encounter (HOSPITAL_COMMUNITY): Payer: Self-pay | Admitting: Emergency Medicine

## 2014-02-27 ENCOUNTER — Emergency Department (HOSPITAL_COMMUNITY): Payer: Medicare Other

## 2014-02-27 DIAGNOSIS — Z79899 Other long term (current) drug therapy: Secondary | ICD-10-CM | POA: Diagnosis not present

## 2014-02-27 DIAGNOSIS — R05 Cough: Secondary | ICD-10-CM | POA: Diagnosis present

## 2014-02-27 DIAGNOSIS — Z590 Homelessness: Secondary | ICD-10-CM | POA: Diagnosis not present

## 2014-02-27 DIAGNOSIS — G8929 Other chronic pain: Secondary | ICD-10-CM | POA: Diagnosis not present

## 2014-02-27 DIAGNOSIS — Z792 Long term (current) use of antibiotics: Secondary | ICD-10-CM | POA: Insufficient documentation

## 2014-02-27 DIAGNOSIS — I10 Essential (primary) hypertension: Secondary | ICD-10-CM | POA: Insufficient documentation

## 2014-02-27 DIAGNOSIS — Z880 Allergy status to penicillin: Secondary | ICD-10-CM | POA: Insufficient documentation

## 2014-02-27 DIAGNOSIS — J441 Chronic obstructive pulmonary disease with (acute) exacerbation: Secondary | ICD-10-CM | POA: Diagnosis not present

## 2014-02-27 DIAGNOSIS — E119 Type 2 diabetes mellitus without complications: Secondary | ICD-10-CM | POA: Diagnosis not present

## 2014-02-27 DIAGNOSIS — Z720 Tobacco use: Secondary | ICD-10-CM | POA: Diagnosis not present

## 2014-02-27 DIAGNOSIS — D649 Anemia, unspecified: Secondary | ICD-10-CM | POA: Diagnosis not present

## 2014-02-27 DIAGNOSIS — Z8701 Personal history of pneumonia (recurrent): Secondary | ICD-10-CM | POA: Diagnosis not present

## 2014-02-27 DIAGNOSIS — J449 Chronic obstructive pulmonary disease, unspecified: Secondary | ICD-10-CM

## 2014-02-27 LAB — CBC WITH DIFFERENTIAL/PLATELET
Basophils Absolute: 0 10*3/uL (ref 0.0–0.1)
Basophils Relative: 0 % (ref 0–1)
Eosinophils Absolute: 0.2 10*3/uL (ref 0.0–0.7)
Eosinophils Relative: 2 % (ref 0–5)
HCT: 29.4 % — ABNORMAL LOW (ref 39.0–52.0)
Hemoglobin: 9.2 g/dL — ABNORMAL LOW (ref 13.0–17.0)
Lymphocytes Relative: 18 % (ref 12–46)
Lymphs Abs: 1.7 10*3/uL (ref 0.7–4.0)
MCH: 26.7 pg (ref 26.0–34.0)
MCHC: 31.3 g/dL (ref 30.0–36.0)
MCV: 85.5 fL (ref 78.0–100.0)
Monocytes Absolute: 0.8 10*3/uL (ref 0.1–1.0)
Monocytes Relative: 8 % (ref 3–12)
NEUTROS ABS: 6.9 10*3/uL (ref 1.7–7.7)
NEUTROS PCT: 72 % (ref 43–77)
Platelets: 309 10*3/uL (ref 150–400)
RBC: 3.44 MIL/uL — AB (ref 4.22–5.81)
RDW: 16.2 % — ABNORMAL HIGH (ref 11.5–15.5)
WBC: 9.6 10*3/uL (ref 4.0–10.5)

## 2014-02-27 LAB — BASIC METABOLIC PANEL
ANION GAP: 5 (ref 5–15)
BUN: 19 mg/dL (ref 6–23)
CO2: 23 mmol/L (ref 19–32)
CREATININE: 0.76 mg/dL (ref 0.50–1.35)
Calcium: 8.2 mg/dL — ABNORMAL LOW (ref 8.4–10.5)
Chloride: 106 mEq/L (ref 96–112)
GFR calc Af Amer: >90 mL/min (ref >90)
GFR, EST NON AFRICAN AMERICAN: 83 mL/min — AB (ref >90)
GLUCOSE: 88 mg/dL (ref 70–99)
Potassium: 4.1 mmol/L (ref 3.5–5.1)
SODIUM: 134 mmol/L — AB (ref 135–145)

## 2014-02-27 MED ORDER — PREDNISONE (PAK) 10 MG PO TABS: ORAL_TABLET | Freq: Every day | ORAL | Status: DC

## 2014-02-27 MED ORDER — IPRATROPIUM-ALBUTEROL 0.5-2.5 (3) MG/3ML IN SOLN
3.0000 mL | Freq: Once | RESPIRATORY_TRACT | Status: AC
  Administered 2014-02-27: 3 mL via RESPIRATORY_TRACT
  Filled 2014-02-27: qty 3

## 2014-02-27 MED ORDER — ALBUTEROL SULFATE HFA 108 (90 BASE) MCG/ACT IN AERS
2.0000 | INHALATION_SPRAY | Freq: Once | RESPIRATORY_TRACT | Status: AC
Start: 2014-02-27 — End: 2014-02-27
  Administered 2014-02-27: 2 via RESPIRATORY_TRACT
  Filled 2014-02-27: qty 6.7

## 2014-02-27 MED ORDER — PREDNISONE 20 MG PO TABS
60.0000 mg | ORAL_TABLET | Freq: Once | ORAL | Status: AC
  Administered 2014-02-27: 60 mg via ORAL
  Filled 2014-02-27: qty 3

## 2014-02-27 NOTE — ED Notes (Signed)
Pt refusing to leave, discharge teaching done, CN advised.

## 2014-02-27 NOTE — Discharge Instructions (Signed)
Read the information below.  Use the prescribed medication as directed.  Please discuss all new medications with your pharmacist.  You may return to the Emergency Department at any time for worsening condition or any new symptoms that concern you.   If you develop worsening shortness of breath, uncontrolled wheezing, severe chest pain, or fevers despite using tylenol and/or ibuprofen, return for a recheck.     °
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
## 2014-02-27 NOTE — ED Notes (Signed)
I gave patient a sandwich and two pieces of cheese and a sprite per nurse.
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
## 2014-03-01 ENCOUNTER — Emergency Department (HOSPITAL_COMMUNITY)
Admission: EM | Admit: 2014-03-01 | Discharge: 2014-03-01 | Disposition: A | Discharge status: Home / Self Care | Payer: Medicare Other | Attending: Emergency Medicine | Admitting: Emergency Medicine

## 2014-03-01 ENCOUNTER — Emergency Department (HOSPITAL_COMMUNITY): Payer: Medicare Other

## 2014-03-01 ENCOUNTER — Encounter (HOSPITAL_COMMUNITY): Payer: Self-pay | Admitting: *Deleted

## 2014-03-01 DIAGNOSIS — Z862 Personal history of diseases of the blood and blood-forming organs and certain disorders involving the immune mechanism: Secondary | ICD-10-CM | POA: Insufficient documentation

## 2014-03-01 DIAGNOSIS — G8929 Other chronic pain: Secondary | ICD-10-CM | POA: Insufficient documentation

## 2014-03-01 DIAGNOSIS — R109 Unspecified abdominal pain: Secondary | ICD-10-CM | POA: Insufficient documentation

## 2014-03-01 DIAGNOSIS — J449 Chronic obstructive pulmonary disease, unspecified: Secondary | ICD-10-CM | POA: Diagnosis not present

## 2014-03-01 DIAGNOSIS — E119 Type 2 diabetes mellitus without complications: Secondary | ICD-10-CM | POA: Insufficient documentation

## 2014-03-01 DIAGNOSIS — Z8781 Personal history of (healed) traumatic fracture: Secondary | ICD-10-CM | POA: Diagnosis not present

## 2014-03-01 DIAGNOSIS — S32029D Unspecified fracture of second lumbar vertebra, subsequent encounter for fracture with routine healing: Secondary | ICD-10-CM | POA: Insufficient documentation

## 2014-03-01 DIAGNOSIS — I10 Essential (primary) hypertension: Secondary | ICD-10-CM | POA: Insufficient documentation

## 2014-03-01 DIAGNOSIS — Z8659 Personal history of other mental and behavioral disorders: Secondary | ICD-10-CM | POA: Insufficient documentation

## 2014-03-01 DIAGNOSIS — Z8719 Personal history of other diseases of the digestive system: Secondary | ICD-10-CM | POA: Insufficient documentation

## 2014-03-01 DIAGNOSIS — R110 Nausea: Secondary | ICD-10-CM | POA: Insufficient documentation

## 2014-03-01 DIAGNOSIS — X58XXXD Exposure to other specified factors, subsequent encounter: Secondary | ICD-10-CM | POA: Diagnosis not present

## 2014-03-01 DIAGNOSIS — Z720 Tobacco use: Secondary | ICD-10-CM | POA: Insufficient documentation

## 2014-03-01 DIAGNOSIS — Z880 Allergy status to penicillin: Secondary | ICD-10-CM | POA: Insufficient documentation

## 2014-03-01 DIAGNOSIS — M545 Low back pain: Secondary | ICD-10-CM | POA: Diagnosis present

## 2014-03-01 DIAGNOSIS — Z8701 Personal history of pneumonia (recurrent): Secondary | ICD-10-CM | POA: Insufficient documentation

## 2014-03-01 DIAGNOSIS — Z590 Homelessness: Secondary | ICD-10-CM | POA: Insufficient documentation

## 2014-03-01 DIAGNOSIS — M549 Dorsalgia, unspecified: Secondary | ICD-10-CM

## 2014-03-01 LAB — URINALYSIS, ROUTINE W REFLEX MICROSCOPIC
BILIRUBIN URINE: NEGATIVE
GLUCOSE, UA: NEGATIVE mg/dL
Hgb urine dipstick: NEGATIVE
Ketones, ur: NEGATIVE mg/dL
LEUKOCYTES UA: NEGATIVE
NITRITE: NEGATIVE
Protein, ur: NEGATIVE mg/dL
Specific Gravity, Urine: 1.012 (ref 1.005–1.030)
UROBILINOGEN UA: 0.2 mg/dL (ref 0.0–1.0)
pH: 7 (ref 5.0–8.0)

## 2014-03-01 LAB — COMPREHENSIVE METABOLIC PANEL
ALBUMIN: 3.1 g/dL — AB (ref 3.5–5.2)
ALK PHOS: 93 U/L (ref 39–117)
ALT: 18 U/L (ref 0–53)
AST: 21 U/L (ref 0–37)
Anion gap: 6 (ref 5–15)
BUN: 20 mg/dL (ref 6–23)
CALCIUM: 8.7 mg/dL (ref 8.4–10.5)
CHLORIDE: 101 meq/L (ref 96–112)
CO2: 26 mmol/L (ref 19–32)
Creatinine, Ser: 1.17 mg/dL (ref 0.50–1.35)
GFR calc non Af Amer: 56 mL/min — ABNORMAL LOW (ref >90)
GFR, EST AFRICAN AMERICAN: 65 mL/min — AB (ref >90)
GLUCOSE: 90 mg/dL (ref 70–99)
POTASSIUM: 4.4 mmol/L (ref 3.5–5.1)
SODIUM: 133 mmol/L — AB (ref 135–145)
Total Bilirubin: 0.4 mg/dL (ref 0.3–1.2)
Total Protein: 6.2 g/dL (ref 6.0–8.3)

## 2014-03-01 LAB — CBC WITH DIFFERENTIAL/PLATELET
BASOS PCT: 0 % (ref 0–1)
Basophils Absolute: 0 10*3/uL (ref 0.0–0.1)
EOS PCT: 2 % (ref 0–5)
Eosinophils Absolute: 0.2 10*3/uL (ref 0.0–0.7)
HCT: 29.8 % — ABNORMAL LOW (ref 39.0–52.0)
Hemoglobin: 9.8 g/dL — ABNORMAL LOW (ref 13.0–17.0)
LYMPHS PCT: 14 % (ref 12–46)
Lymphs Abs: 1.4 10*3/uL (ref 0.7–4.0)
MCH: 27.4 pg (ref 26.0–34.0)
MCHC: 32.9 g/dL (ref 30.0–36.0)
MCV: 83.2 fL (ref 78.0–100.0)
Monocytes Absolute: 0.5 10*3/uL (ref 0.1–1.0)
Monocytes Relative: 5 % (ref 3–12)
NEUTROS PCT: 79 % — AB (ref 43–77)
Neutro Abs: 8.1 10*3/uL — ABNORMAL HIGH (ref 1.7–7.7)
Platelets: 317 10*3/uL (ref 150–400)
RBC: 3.58 MIL/uL — ABNORMAL LOW (ref 4.22–5.81)
RDW: 16 % — ABNORMAL HIGH (ref 11.5–15.5)
WBC: 10.2 10*3/uL (ref 4.0–10.5)

## 2014-03-01 LAB — LIPASE, BLOOD: Lipase: 32 U/L (ref 11–59)

## 2014-03-01 MED ORDER — ONDANSETRON HCL 4 MG/2ML IJ SOLN
4.0000 mg | Freq: Once | INTRAMUSCULAR | Status: AC
  Administered 2014-03-01: 4 mg via INTRAVENOUS
  Filled 2014-03-01: qty 2

## 2014-03-01 MED ORDER — SODIUM CHLORIDE 0.9 % IV BOLUS (SEPSIS)
1000.0000 mL | Freq: Once | INTRAVENOUS | Status: AC
  Administered 2014-03-01: 1000 mL via INTRAVENOUS

## 2014-03-01 MED ORDER — IOHEXOL 350 MG/ML SOLN
100.0000 mL | Freq: Once | INTRAVENOUS | Status: AC | PRN
  Administered 2014-03-01: 100 mL via INTRAVENOUS

## 2014-03-01 NOTE — ED Notes (Signed)
Patient given urinal and something to drink.

## 2014-03-01 NOTE — ED Notes (Signed)
Pt returned from CT °

## 2014-03-01 NOTE — ED Notes (Signed)
Pt homeless in via [facility], pt c/o lower R back pain, pt ambulatory with assistance that is reported by pt to be baseline, pt denies dysuria, pt A&O x4, follows commands, speaks in complete, denies injury to the area

## 2014-03-01 NOTE — Discharge Instructions (Signed)
Stay hydrated.   Take tylenol, motrin for pain.   Follow up with your doctor.   Go to a shelter.   Return to ER if you have severe pain, vomiting, fever, trouble breathing.

## 2014-03-01 NOTE — Progress Notes (Signed)
CSW familiar with pt from previous visits. Pt uninterested in going to shelter this pm, stating that he wants to stay in bed in ED.  Explained to pt that this was not possible.  Pt agreeable to pay for bed.  Again, CSW explained that this was not an option. Pt familiar with local shelters/resources.  Ok to give him a buss pass at d/c.

## 2014-03-01 NOTE — ED Notes (Signed)
MD Yao at the bedside.  
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
## 2014-03-04 ENCOUNTER — Encounter (HOSPITAL_COMMUNITY): Payer: Self-pay | Admitting: *Deleted

## 2014-03-04 ENCOUNTER — Emergency Department (HOSPITAL_COMMUNITY)
Admission: EM | Admit: 2014-03-04 | Discharge: 2014-03-05 | Disposition: A | Discharge status: Home / Self Care | Payer: Medicare Other | Attending: Emergency Medicine | Admitting: Emergency Medicine

## 2014-03-04 DIAGNOSIS — Z7952 Long term (current) use of systemic steroids: Secondary | ICD-10-CM | POA: Insufficient documentation

## 2014-03-04 DIAGNOSIS — Z79899 Other long term (current) drug therapy: Secondary | ICD-10-CM | POA: Insufficient documentation

## 2014-03-04 DIAGNOSIS — Z720 Tobacco use: Secondary | ICD-10-CM | POA: Insufficient documentation

## 2014-03-04 DIAGNOSIS — Z8781 Personal history of (healed) traumatic fracture: Secondary | ICD-10-CM | POA: Diagnosis not present

## 2014-03-04 DIAGNOSIS — J449 Chronic obstructive pulmonary disease, unspecified: Secondary | ICD-10-CM | POA: Diagnosis not present

## 2014-03-04 DIAGNOSIS — Z590 Homelessness: Secondary | ICD-10-CM | POA: Insufficient documentation

## 2014-03-04 DIAGNOSIS — I10 Essential (primary) hypertension: Secondary | ICD-10-CM | POA: Diagnosis not present

## 2014-03-04 DIAGNOSIS — D649 Anemia, unspecified: Secondary | ICD-10-CM | POA: Diagnosis not present

## 2014-03-04 DIAGNOSIS — Z8701 Personal history of pneumonia (recurrent): Secondary | ICD-10-CM | POA: Diagnosis not present

## 2014-03-04 DIAGNOSIS — R059 Cough, unspecified: Secondary | ICD-10-CM

## 2014-03-04 DIAGNOSIS — E119 Type 2 diabetes mellitus without complications: Secondary | ICD-10-CM | POA: Diagnosis not present

## 2014-03-04 DIAGNOSIS — R112 Nausea with vomiting, unspecified: Secondary | ICD-10-CM | POA: Diagnosis present

## 2014-03-04 DIAGNOSIS — R05 Cough: Secondary | ICD-10-CM | POA: Insufficient documentation

## 2014-03-04 DIAGNOSIS — Z880 Allergy status to penicillin: Secondary | ICD-10-CM | POA: Insufficient documentation

## 2014-03-04 DIAGNOSIS — G8929 Other chronic pain: Secondary | ICD-10-CM | POA: Diagnosis not present

## 2014-03-04 DIAGNOSIS — Z8659 Personal history of other mental and behavioral disorders: Secondary | ICD-10-CM | POA: Diagnosis not present

## 2014-03-04 NOTE — ED Notes (Signed)
Patient stated he started feeling nauseated about 2 1/2 hours ago after eating pinto beans at [facility].  Vomited 2-3 times since starting to feel bad.   EMS reported BP 168/92, P 76, R 18, 98% RA

## 2014-03-04 NOTE — ED Provider Notes (Signed)
CSN: [id number]     Arrival date & time 03/04/14  2342 History   First MD Initiated Contact with Patient 03/04/14 2349     Chief Complaint  Patient presents with  . Nausea  . Emesis     (Consider location/radiation/quality/duration/timing/severity/associated sxs/prior Treatment) Patient is a 81 y.o. male presenting with vomiting. The history is provided by the patient.  Emesis He complains of nausea and vomiting for the last 3 hours. He is a very fake historian. On one occasion, he stated that he was having abdominal pain but on 2 other occasions she stated that it wasn't pain but just nausea. Nothing makes symptoms better nothing makes them worse. He denies any alcohol consumption tonight stating that he stopped drinking quite a while ago but then stating that he drinks only occasionally but still denies having had anything tonight. He also has a chronic cough which is unchanged. Cough is nonproductive. He is complaining of very cold. It is noted on old records that he is homeless.  Past Medical History  Diagnosis Date  . COPD (chronic obstructive pulmonary disease)   . Chronic back pain   . Pneumonia     01/2011 - CAP vs aspiration pneuonmia  . Depression   . PONV (postoperative nausea and vomiting)   . Hyponatremia     Previously felt secondary to SIADH  . Hypertension   . Upper GI bleed     01/2011 with EGD showing severe candida esophagitis and duodenal bulb erosion  . Anemia     In the setting of UGI bleed 01/2011 requiring blood transfusion  . Homelessness   . Dyspnea     Chronic, thought due to COPD. Echo 02/2010 with EF 30-35%, nuclear study negative, then repeat echo 03/2011 did not show systolic dysfxn, so unclear if truly HF  . Bronchitis   . Diabetes mellitus without complication   . Collar bone fracture     left   Past Surgical History  Procedure Laterality Date  . Tonsillectomy    . Vasectomy    . Appendectomy    . Tonsillectomy    . Colonoscopy    .  Esophagogastroduodenoscopy    . Esophagogastroduodenoscopy  02/03/2011    Procedure: ESOPHAGOGASTRODUODENOSCOPY (EGD);  Surgeon: Juanita Craver, MD;  Location: [facility];  Service: Endoscopy;  Laterality: N/A;  . Colonoscopy  01/30/2012    Procedure: COLONOSCOPY;  Surgeon: Ladene Artist, MD,FACG;  Location: [facility];  Service: Endoscopy;  Laterality: N/A;   Family History  Problem Relation Age of Onset  . Coronary artery disease Father     Starting in his 46's. Died of massive MI at age 48.  . Schizophrenia Brother   . Coronary artery disease Sister     MI at age 29  . Alzheimer's disease Mother    History  Substance Use Topics  . Smoking status: Current Some Day Smoker -- 1.00 packs/day for 60 years    Types: Cigarettes  . Smokeless tobacco: Never Used     Comment: Since age 68  . Alcohol Use: Yes     Comment: 174 ml of scotch weekly    Review of Systems  Gastrointestinal: Positive for vomiting.  All other systems reviewed and are negative.     Allergies  Benadryl and Penicillins  Home Medications   Prior to Admission medications   Medication Sig Start Date End Date Taking? Authorizing Provider  amLODipine (NORVASC) 5 MG tablet Take 1 tablet (5 mg total) by mouth daily. Patient not  taking: Reported on 02/27/2014 01/13/14   Debbe Odea, MD  folic acid (FOLVITE) 1 MG tablet Take 1 tablet (1 mg total) by mouth daily. Patient not taking: Reported on 02/27/2014 01/13/14   Debbe Odea, MD  guaiFENesin (MUCINEX) 600 MG 12 hr tablet Take 1 tablet (600 mg total) by mouth 2 (two) times daily as needed for to loosen phlegm. Patient not taking: Reported on 02/27/2014 01/13/14   Debbe Odea, MD  HYDROcodone-acetaminophen (NORCO/VICODIN) 5-325 MG per tablet Take 1-2 tablets by mouth every 4 (four) hours as needed for moderate pain. Patient not taking: Reported on 02/27/2014 02/21/14   Theodis Blaze, MD  nicotine (NICODERM CQ - DOSED IN MG/24 HR) 7 mg/24hr patch Place 1 patch (7 mg  total) onto the skin daily. Patient not taking: Reported on 02/27/2014 01/13/14   Debbe Odea, MD  predniSONE (STERAPRED UNI-PAK) 10 MG tablet Take by mouth daily. Day 1: take 6 tabs.  Day 2: 5 tabs  Day 3: 4 tabs  Day 4: 3 tabs  Day 5: 2 tabs  Day 6: 1 tab Patient not taking: Reported on 03/01/2014 02/27/14   Clayton Bibles, PA-C  thiamine 100 MG tablet Take 1 tablet (100 mg total) by mouth daily. Patient not taking: Reported on 02/27/2014 01/13/14   Debbe Odea, MD   BP 171/78 mmHg  Pulse 77  Temp(Src) 98.8 F (37.1 C) (Oral)  Resp 16  Ht 5' 7.5" (1.715 m)  Wt 133 lb (60.328 kg)  BMI 20.51 kg/m2  SpO2 99% Physical Exam  Nursing note and vitals reviewed.  81 year old male, resting comfortably and in no acute distress. Vital signs are significant for hypertension. Oxygen saturation is 99%, which is normal. Head is normocephalic and atraumatic. PERRLA, EOMI. Oropharynx is clear. Neck is nontender and supple without adenopathy or JVD. Back is nontender and there is no CVA tenderness. Lungs have faint expiratory wheezes without rales or rhonchi. Chest is nontender. Heart has regular rate and rhythm without murmur. Abdomen is soft, flat, nontender without masses or hepatosplenomegaly and peristalsis is hypoactive. Extremities have no cyanosis or edema, full range of motion is present. Skin is warm and dry without rash. Neurologic: Mental status is normal, cranial nerves are intact, there are no motor or sensory deficits.  ED Course  Procedures (including critical care time) Labs Review Results for orders placed or performed during the hospital encounter of 03/04/14  CBC with Differential  Result Value Ref Range   WBC 8.1 4.0 - 10.5 K/uL   RBC 3.20 (L) 4.22 - 5.81 MIL/uL   Hemoglobin 8.8 (L) 13.0 - 17.0 g/dL   HCT 27.8 (L) 39.0 - 52.0 %   MCV 86.9 78.0 - 100.0 fL   MCH 27.5 26.0 - 34.0 pg   MCHC 31.7 30.0 - 36.0 g/dL   RDW 16.4 (H) 11.5 - 15.5 %   Platelets 255 150 - 400 K/uL    Neutrophils Relative % 71 43 - 77 %   Neutro Abs 5.7 1.7 - 7.7 K/uL   Lymphocytes Relative 20 12 - 46 %   Lymphs Abs 1.6 0.7 - 4.0 K/uL   Monocytes Relative 6 3 - 12 %   Monocytes Absolute 0.5 0.1 - 1.0 K/uL   Eosinophils Relative 3 0 - 5 %   Eosinophils Absolute 0.3 0.0 - 0.7 K/uL   Basophils Relative 0 0 - 1 %   Basophils Absolute 0.0 0.0 - 0.1 K/uL  Comprehensive metabolic panel  Result Value Ref Range  Sodium 134 (L) 135 - 145 mmol/L   Potassium 4.5 3.5 - 5.1 mmol/L   Chloride 104 96 - 112 mmol/L   CO2 26 19 - 32 mmol/L   Glucose, Bld 98 70 - 99 mg/dL   BUN 19 6 - 23 mg/dL   Creatinine, Ser 1.08 0.50 - 1.35 mg/dL   Calcium 8.5 8.4 - 10.5 mg/dL   Total Protein 6.2 6.0 - 8.3 g/dL   Albumin 3.1 (L) 3.5 - 5.2 g/dL   AST 19 0 - 37 U/L   ALT 21 0 - 53 U/L   Alkaline Phosphatase 92 39 - 117 U/L   Total Bilirubin 0.3 0.3 - 1.2 mg/dL   GFR calc non Af Amer 62 (L) >90 mL/min   GFR calc Af Amer 72 (L) >90 mL/min   Anion gap 4 (L) 5 - 15  Lipase, blood  Result Value Ref Range   Lipase 42 11 - 59 U/L  POC occult blood, ED Provider will collect  Result Value Ref Range   Fecal Occult Bld NEGATIVE NEGATIVE   Imaging Review Dg Chest 2 View  03/05/2014   CLINICAL DATA:  Acute onset of vomiting with cough. Initial encounter.  EXAM: CHEST  2 VIEW  COMPARISON:  Chest radiograph from 02/27/2014  FINDINGS: The lungs are well-aerated. Vascular congestion is noted, with diffusely increased interstitial markings, concerning for mild interstitial edema. Underlying chronic interstitial lung disease is suggested. There is no evidence of pleural effusion or pneumothorax.  The heart is mildly enlarged. No acute osseous abnormalities are seen.  IMPRESSION: Vascular congestion and mild cardiomegaly, with diffusely increased interstitial markings, concerning for mild interstitial edema. Suggestion of mild underlying chronic interstitial lung disease.   Electronically Signed   By: Garald Balding M.D.   On:  03/05/2014 00:37     Date: 03/04/2014  Rate: 76  Rhythm: normal sinus rhythm  QRS Axis: normal  Intervals: normal  ST/T Wave abnormalities: normal  Conduction Disutrbances:none  Narrative Interpretation: Left atrial hypertrophy, old anteroseptal myocardial infarction. When compared with ECG of 02/28/2014, PVCs are no longer present.  Old EKG Reviewed: changes noted    MDM   Final diagnoses:  Cough  Nausea and vomiting, vomiting of unspecified type  Normochromic normocytic anemia    Nausea and vomiting of uncertain cause. Old records are reviewed and he has been in the ED for alcohol abuse and also for pancreatitis as well as COPD and pneumonia. Chest x-ray will be obtained and he will be given albuterol with ipratropium nebulizer treatment. He is given IV fluids as well as some ondansetron for nausea.  Laboratory workup is significant for drop in hemoglobin. Stool was sent for Hemoccult testing which was negative. He is been sleeping comfortably in the ED and will be discharged.  Delora Fuel, MD 21/30/86 [id number]

## 2014-03-05 ENCOUNTER — Emergency Department (HOSPITAL_COMMUNITY): Payer: Medicare Other

## 2014-03-05 LAB — POC OCCULT BLOOD, ED: FECAL OCCULT BLD: NEGATIVE

## 2014-03-05 LAB — URINALYSIS, ROUTINE W REFLEX MICROSCOPIC
BILIRUBIN URINE: NEGATIVE
GLUCOSE, UA: NEGATIVE mg/dL
Hgb urine dipstick: NEGATIVE
Ketones, ur: NEGATIVE mg/dL
Leukocytes, UA: NEGATIVE
Nitrite: NEGATIVE
Protein, ur: NEGATIVE mg/dL
Specific Gravity, Urine: 1.007 (ref 1.005–1.030)
Urobilinogen, UA: 0.2 mg/dL (ref 0.0–1.0)
pH: 7 (ref 5.0–8.0)

## 2014-03-05 LAB — COMPREHENSIVE METABOLIC PANEL
ALT: 21 U/L (ref 0–53)
ANION GAP: 4 — AB (ref 5–15)
AST: 19 U/L (ref 0–37)
Albumin: 3.1 g/dL — ABNORMAL LOW (ref 3.5–5.2)
Alkaline Phosphatase: 92 U/L (ref 39–117)
BILIRUBIN TOTAL: 0.3 mg/dL (ref 0.3–1.2)
BUN: 19 mg/dL (ref 6–23)
CHLORIDE: 104 mmol/L (ref 96–112)
CO2: 26 mmol/L (ref 19–32)
CREATININE: 1.08 mg/dL (ref 0.50–1.35)
Calcium: 8.5 mg/dL (ref 8.4–10.5)
GFR calc Af Amer: 72 mL/min — ABNORMAL LOW (ref >90)
GFR calc non Af Amer: 62 mL/min — ABNORMAL LOW (ref >90)
Glucose, Bld: 98 mg/dL (ref 70–99)
Potassium: 4.5 mmol/L (ref 3.5–5.1)
SODIUM: 134 mmol/L — AB (ref 135–145)
TOTAL PROTEIN: 6.2 g/dL (ref 6.0–8.3)

## 2014-03-05 LAB — LIPASE, BLOOD: Lipase: 42 U/L (ref 11–59)

## 2014-03-05 LAB — CBC WITH DIFFERENTIAL/PLATELET
BASOS ABS: 0 10*3/uL (ref 0.0–0.1)
Basophils Relative: 0 % (ref 0–1)
EOS ABS: 0.3 10*3/uL (ref 0.0–0.7)
Eosinophils Relative: 3 % (ref 0–5)
HCT: 27.8 % — ABNORMAL LOW (ref 39.0–52.0)
HEMOGLOBIN: 8.8 g/dL — AB (ref 13.0–17.0)
LYMPHS PCT: 20 % (ref 12–46)
Lymphs Abs: 1.6 10*3/uL (ref 0.7–4.0)
MCH: 27.5 pg (ref 26.0–34.0)
MCHC: 31.7 g/dL (ref 30.0–36.0)
MCV: 86.9 fL (ref 78.0–100.0)
MONO ABS: 0.5 10*3/uL (ref 0.1–1.0)
MONOS PCT: 6 % (ref 3–12)
Neutro Abs: 5.7 10*3/uL (ref 1.7–7.7)
Neutrophils Relative %: 71 % (ref 43–77)
Platelets: 255 10*3/uL (ref 150–400)
RBC: 3.2 MIL/uL — AB (ref 4.22–5.81)
RDW: 16.4 % — AB (ref 11.5–15.5)
WBC: 8.1 10*3/uL (ref 4.0–10.5)

## 2014-03-05 MED ORDER — SODIUM CHLORIDE 0.9 % IV SOLN
1000.0000 mL | INTRAVENOUS | Status: DC
  Administered 2014-03-05: 1000 mL via INTRAVENOUS

## 2014-03-05 MED ORDER — IPRATROPIUM-ALBUTEROL 0.5-2.5 (3) MG/3ML IN SOLN
3.0000 mL | Freq: Once | RESPIRATORY_TRACT | Status: DC
  Filled 2014-03-05: qty 3

## 2014-03-05 MED ORDER — ONDANSETRON HCL 4 MG/2ML IJ SOLN
4.0000 mg | Freq: Once | INTRAMUSCULAR | Status: AC
  Administered 2014-03-05: 4 mg via INTRAVENOUS
  Filled 2014-03-05: qty 2

## 2014-03-05 MED ORDER — ONDANSETRON HCL 4 MG PO TABS: 4.0000 mg | ORAL_TABLET | Freq: Four times a day (QID) | ORAL | Status: DC | PRN

## 2014-03-05 MED ORDER — SODIUM CHLORIDE 0.9 % IV SOLN
1000.0000 mL | Freq: Once | INTRAVENOUS | Status: AC
  Administered 2014-03-05: 1000 mL via INTRAVENOUS

## 2014-03-05 NOTE — Discharge Instructions (Signed)
Nausea and Vomiting °Nausea is a sick feeling that often comes before throwing up (vomiting). Vomiting is a reflex where stomach contents come out of your mouth. Vomiting can cause severe loss of body fluids (dehydration). Children and elderly adults can become dehydrated quickly, especially if they also have diarrhea. Nausea and vomiting are symptoms of a condition or disease. It is important to find the cause of your symptoms. °CAUSES  °· Direct irritation of the stomach lining. This irritation can result from increased acid production (gastroesophageal reflux disease), infection, food poisoning, taking certain medicines (such as nonsteroidal anti-inflammatory drugs), alcohol use, or tobacco use. °· Signals from the brain. These signals could be caused by a headache, heat exposure, an inner ear disturbance, increased pressure in the brain from injury, infection, a tumor, or a concussion, pain, emotional stimulus, or metabolic problems. °· An obstruction in the gastrointestinal tract (bowel obstruction). °· Illnesses such as diabetes, hepatitis, gallbladder problems, appendicitis, kidney problems, cancer, sepsis, atypical symptoms of a heart attack, or eating disorders. °· Medical treatments such as chemotherapy and radiation. °· Receiving medicine that makes you sleep (general anesthetic) during surgery. °DIAGNOSIS °Your caregiver may ask for tests to be done if the problems do not improve after a few days. Tests may also be done if symptoms are severe or if the reason for the nausea and vomiting is not clear. Tests may include: °· Urine tests. °· Blood tests. °· Stool tests. °· Cultures (to look for evidence of infection). °· X-rays or other imaging studies. °Test results can help your caregiver make decisions about treatment or the need for additional tests. °TREATMENT °You need to stay well hydrated. Drink frequently but in small amounts. You may wish to drink water, sports drinks, clear broth, or eat frozen  ice pops or gelatin dessert to help stay hydrated. When you eat, eating slowly may help prevent nausea. There are also some antinausea medicines that may help prevent nausea. °HOME CARE INSTRUCTIONS  °· Take all medicine as directed by your caregiver. °· If you do not have an appetite, do not force yourself to eat. However, you must continue to drink fluids. °· If you have an appetite, eat a normal diet unless your caregiver tells you differently. °¨ Eat a variety of complex carbohydrates (rice, wheat, potatoes, bread), lean meats, yogurt, fruits, and vegetables. °¨ Avoid high-fat foods because they are more difficult to digest. °· Drink enough water and fluids to keep your urine clear or pale yellow. °· If you are dehydrated, ask your caregiver for specific rehydration instructions. Signs of dehydration may include: °¨ Severe thirst. °¨ Dry lips and mouth. °¨ Dizziness. °¨ Dark urine. °¨ Decreasing urine frequency and amount. °¨ Confusion. °¨ Rapid breathing or pulse. °SEEK IMMEDIATE MEDICAL CARE IF:  °· You have blood or brown flecks (like coffee grounds) in your vomit. °· You have black or bloody stools. °· You have a severe headache or stiff neck. °· You are confused. °· You have severe abdominal pain. °· You have chest pain or trouble breathing. °· You do not urinate at least once every 8 hours. °· You develop cold or clammy skin. °· You continue to vomit for longer than 24 to 48 hours. °· You have a fever. °MAKE SURE YOU:  °· Understand these instructions. °· Will watch your condition. °· Will get help right away if you are not doing well or get worse. °Document Released: 01/28/2005 Document Revised: 04/22/2011 Document Reviewed: 06/27/2010 °[employer]® Patient Information ©2015 [employer]. This information is not intended   to replace advice given to you by your health care provider. Make sure you discuss any questions you have with your health care provider. ° °Ondansetron tablets °What is this  medicine? °ONDANSETRON (on DAN se tron) is used to treat nausea and vomiting caused by chemotherapy. It is also used to prevent or treat nausea and vomiting after surgery. °This medicine may be used for other purposes; ask your health care provider or pharmacist if you have questions. °COMMON BRAND NAME(S): Zofran °What should I tell my health care provider before I take this medicine? °They need to know if you have any of these conditions: °-heart disease °-history of irregular heartbeat °-liver disease °-low levels of magnesium or potassium in the blood °-an unusual or allergic reaction to ondansetron, granisetron, other medicines, foods, dyes, or preservatives °-pregnant or trying to get pregnant °-breast-feeding °How should I use this medicine? °Take this medicine by mouth with a glass of water. Follow the directions on your prescription label. Take your doses at regular intervals. Do not take your medicine more often than directed. °Talk to your pediatrician regarding the use of this medicine in children. Special care may be needed. °Overdosage: If you think you have taken too much of this medicine contact a poison control center or emergency room at once. °NOTE: This medicine is only for you. Do not share this medicine with others. °What if I miss a dose? °If you miss a dose, take it as soon as you can. If it is almost time for your next dose, take only that dose. Do not take double or extra doses. °What may interact with this medicine? °Do not take this medicine with any of the following medications: °-apomorphine °-certain medicines for fungal infections like fluconazole, itraconazole, ketoconazole, posaconazole, voriconazole °-cisapride °-dofetilide °-dronedarone °-pimozide °-thioridazine °-ziprasidone °This medicine may also interact with the following medications: °-carbamazepine °-certain medicines for depression, anxiety, or psychotic disturbances °-fentanyl °-linezolid °-MAOIs like Carbex, Eldepryl,  Marplan, Nardil, and Parnate °-methylene blue (injected into a vein) °-other medicines that prolong the QT interval (cause an abnormal heart rhythm) °-phenytoin °-rifampicin °-tramadol °This list may not describe all possible interactions. Give your health care provider a list of all the medicines, herbs, non-prescription drugs, or dietary supplements you use. Also tell them if you smoke, drink alcohol, or use illegal drugs. Some items may interact with your medicine. °What should I watch for while using this medicine? °Check with your doctor or health care professional right away if you have any sign of an allergic reaction. °What side effects may I notice from receiving this medicine? °Side effects that you should report to your doctor or health care professional as soon as possible: °-allergic reactions like skin rash, itching or hives, swelling of the face, lips or tongue °-breathing problems °-confusion °-dizziness °-fast or irregular heartbeat °-feeling faint or lightheaded, falls °-fever and chills °-loss of balance or coordination °-seizures °-sweating °-swelling of the hands or feet °-tightness in the chest °-tremors °-unusually weak or tired °Side effects that usually do not require medical attention (report to your doctor or health care professional if they continue or are bothersome): °-constipation or diarrhea °-headache °This list may not describe all possible side effects. Call your doctor for medical advice about side effects. You may report side effects to FDA at 1-800-FDA-1088. °Where should I keep my medicine? °Keep out of the reach of children. °Store between 2 and 30 degrees C (36 and 86 degrees F). Throw away any unused medicine after the expiration   date. °NOTE: This sheet is a summary. It may not cover all possible information. If you have questions about this medicine, talk to your doctor, pharmacist, or health care provider. °© 2015, [employer]. (2012-11-04 16:27:45) ° °

## 2014-03-05 NOTE — ED Notes (Signed)
Breakfast tray ordered 

## 2014-03-08 ENCOUNTER — Emergency Department (HOSPITAL_COMMUNITY)
Admission: EM | Admit: 2014-03-08 | Discharge: 2014-03-08 | Disposition: A | Discharge status: Home / Self Care | Payer: Medicare Other | Attending: Emergency Medicine | Admitting: Emergency Medicine

## 2014-03-08 ENCOUNTER — Encounter (HOSPITAL_COMMUNITY): Payer: Self-pay | Admitting: Emergency Medicine

## 2014-03-08 DIAGNOSIS — Z5900 Homelessness unspecified: Secondary | ICD-10-CM

## 2014-03-08 DIAGNOSIS — Z8781 Personal history of (healed) traumatic fracture: Secondary | ICD-10-CM | POA: Insufficient documentation

## 2014-03-08 DIAGNOSIS — M79605 Pain in left leg: Secondary | ICD-10-CM | POA: Insufficient documentation

## 2014-03-08 DIAGNOSIS — I10 Essential (primary) hypertension: Secondary | ICD-10-CM | POA: Insufficient documentation

## 2014-03-08 DIAGNOSIS — J449 Chronic obstructive pulmonary disease, unspecified: Secondary | ICD-10-CM | POA: Insufficient documentation

## 2014-03-08 DIAGNOSIS — D649 Anemia, unspecified: Secondary | ICD-10-CM | POA: Insufficient documentation

## 2014-03-08 DIAGNOSIS — J40 Bronchitis, not specified as acute or chronic: Secondary | ICD-10-CM

## 2014-03-08 DIAGNOSIS — Z8719 Personal history of other diseases of the digestive system: Secondary | ICD-10-CM | POA: Insufficient documentation

## 2014-03-08 DIAGNOSIS — M549 Dorsalgia, unspecified: Secondary | ICD-10-CM | POA: Diagnosis not present

## 2014-03-08 DIAGNOSIS — M79604 Pain in right leg: Secondary | ICD-10-CM | POA: Insufficient documentation

## 2014-03-08 DIAGNOSIS — Z8659 Personal history of other mental and behavioral disorders: Secondary | ICD-10-CM | POA: Insufficient documentation

## 2014-03-08 DIAGNOSIS — Z8701 Personal history of pneumonia (recurrent): Secondary | ICD-10-CM | POA: Diagnosis not present

## 2014-03-08 DIAGNOSIS — Z880 Allergy status to penicillin: Secondary | ICD-10-CM | POA: Diagnosis not present

## 2014-03-08 DIAGNOSIS — M79606 Pain in leg, unspecified: Secondary | ICD-10-CM

## 2014-03-08 DIAGNOSIS — Z720 Tobacco use: Secondary | ICD-10-CM | POA: Insufficient documentation

## 2014-03-08 DIAGNOSIS — G8929 Other chronic pain: Secondary | ICD-10-CM | POA: Diagnosis not present

## 2014-03-08 DIAGNOSIS — R531 Weakness: Secondary | ICD-10-CM | POA: Insufficient documentation

## 2014-03-08 DIAGNOSIS — E119 Type 2 diabetes mellitus without complications: Secondary | ICD-10-CM | POA: Diagnosis not present

## 2014-03-08 MED ORDER — OXYCODONE-ACETAMINOPHEN 5-325 MG PO TABS
1.0000 | ORAL_TABLET | Freq: Once | ORAL | Status: AC
  Administered 2014-03-08: 1 via ORAL
  Filled 2014-03-08: qty 1

## 2014-03-08 MED ORDER — ALBUTEROL SULFATE HFA 108 (90 BASE) MCG/ACT IN AERS
2.0000 | INHALATION_SPRAY | Freq: Once | RESPIRATORY_TRACT | Status: AC
  Administered 2014-03-08: 2 via RESPIRATORY_TRACT
  Filled 2014-03-08: qty 6.7

## 2014-03-08 NOTE — Discharge Instructions (Signed)
Take inhaler 2 puffs for cough and shortness of breath. Stay off of your legs, elevate. Follow up with [facility].
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
## 2014-03-09 ENCOUNTER — Emergency Department (HOSPITAL_COMMUNITY)
Admission: EM | Admit: 2014-03-09 | Discharge: 2014-03-10 | Disposition: A | Discharge status: Home / Self Care | Payer: Medicare Other | Attending: Emergency Medicine | Admitting: Emergency Medicine

## 2014-03-09 ENCOUNTER — Encounter (HOSPITAL_COMMUNITY): Payer: Self-pay | Admitting: *Deleted

## 2014-03-09 DIAGNOSIS — Z880 Allergy status to penicillin: Secondary | ICD-10-CM | POA: Diagnosis not present

## 2014-03-09 DIAGNOSIS — Y998 Other external cause status: Secondary | ICD-10-CM | POA: Diagnosis not present

## 2014-03-09 DIAGNOSIS — Z8781 Personal history of (healed) traumatic fracture: Secondary | ICD-10-CM | POA: Insufficient documentation

## 2014-03-09 DIAGNOSIS — S3992XA Unspecified injury of lower back, initial encounter: Secondary | ICD-10-CM | POA: Insufficient documentation

## 2014-03-09 DIAGNOSIS — Z720 Tobacco use: Secondary | ICD-10-CM | POA: Insufficient documentation

## 2014-03-09 DIAGNOSIS — Z7952 Long term (current) use of systemic steroids: Secondary | ICD-10-CM | POA: Diagnosis not present

## 2014-03-09 DIAGNOSIS — Z79899 Other long term (current) drug therapy: Secondary | ICD-10-CM | POA: Diagnosis not present

## 2014-03-09 DIAGNOSIS — J449 Chronic obstructive pulmonary disease, unspecified: Secondary | ICD-10-CM | POA: Diagnosis not present

## 2014-03-09 DIAGNOSIS — Y9389 Activity, other specified: Secondary | ICD-10-CM | POA: Diagnosis not present

## 2014-03-09 DIAGNOSIS — E119 Type 2 diabetes mellitus without complications: Secondary | ICD-10-CM | POA: Insufficient documentation

## 2014-03-09 DIAGNOSIS — Z5900 Homelessness unspecified: Secondary | ICD-10-CM

## 2014-03-09 DIAGNOSIS — Z8719 Personal history of other diseases of the digestive system: Secondary | ICD-10-CM | POA: Diagnosis not present

## 2014-03-09 DIAGNOSIS — Z862 Personal history of diseases of the blood and blood-forming organs and certain disorders involving the immune mechanism: Secondary | ICD-10-CM | POA: Insufficient documentation

## 2014-03-09 DIAGNOSIS — Z8701 Personal history of pneumonia (recurrent): Secondary | ICD-10-CM | POA: Diagnosis not present

## 2014-03-09 DIAGNOSIS — Y9289 Other specified places as the place of occurrence of the external cause: Secondary | ICD-10-CM | POA: Insufficient documentation

## 2014-03-09 DIAGNOSIS — W1839XA Other fall on same level, initial encounter: Secondary | ICD-10-CM | POA: Insufficient documentation

## 2014-03-09 DIAGNOSIS — Z8659 Personal history of other mental and behavioral disorders: Secondary | ICD-10-CM | POA: Diagnosis not present

## 2014-03-09 DIAGNOSIS — I10 Essential (primary) hypertension: Secondary | ICD-10-CM | POA: Insufficient documentation

## 2014-03-09 NOTE — ED Notes (Signed)
Bed: WA07 Expected date:  Expected time:  Means of arrival:  Comments: EMS 81 yo with stomach pain and back pain 7 months
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
## 2014-03-10 NOTE — Discharge Instructions (Signed)
°Emergency Department Resource Guide °1) Find a Doctor and Pay Out of Pocket °Although you won't have to find out who is covered by your insurance plan, it is a good idea to ask around and get recommendations. You will then need to call the office and see if the doctor you have chosen will accept you as a new patient and what types of options they offer for patients who are self-pay. Some doctors offer discounts or will set up payment plans for their patients who do not have insurance, but you will need to ask so you aren't surprised when you get to your appointment. ° °2) Contact Your Local Health Department °Not all health departments have doctors that can see patients for sick visits, but many do, so it is worth a call to see if yours does. If you don't know where your local health department is, you can check in your phone book. The CDC also has a tool to help you locate your state's health department, and many state websites also have listings of all of their local health departments. ° °3) Find a Walk-in Clinic °If your illness is not likely to be very severe or complicated, you may want to try a walk in clinic. These are popping up all over the country in pharmacies, drugstores, and shopping centers. They're usually staffed by nurse practitioners or physician assistants that have been trained to treat common illnesses and complaints. They're usually fairly quick and inexpensive. However, if you have serious medical issues or chronic medical problems, these are probably not your best option. ° °No Primary Care Doctor: °- Call [facility] at  832-8000 - they can help you locate a primary care doctor that  accepts your insurance, provides certain services, etc. °- Physician Referral Service- 1-800-533-3463 ° °Chronic Pain Problems: °Organization         Address  Phone   Notes  °[facility]  (336) 297-2271 Patients need to be referred by their primary care doctor.  ° °Medication  Assistance: °Organization         Address  Phone   Notes  °[place] [facility] [address] °[place], [place] [address] (336) 641-8030 --Must be a resident of [place] °-- Must have NO insurance coverage whatsoever (no Medicaid/ Medicare, etc.) °-- The pt. MUST have a primary care doctor that directs their care regularly and follows them in the community °  °[facility]  (866) 331-1348   °[employer]  (888) 892-1162   ° °Agencies that provide inexpensive medical care: °Organization         Address  Phone   Notes  °[facility]  (336) 832-8035   °[facility]    (336) 832-7272   °[facility] [address] °[place], [place] [address] (336) 832-4777   °[facility] [address], °[place] (336) 271-4999   °[facility]    (336) 373-0678   °[facility]    (336) 272-1050   °[facility] ° [address], [place] Phone:  (336) 832-4444, Fax:  (336) 832-4440 Hours of Operation:  9 am - 6 pm, M-F.  Also accepts Medicaid/Medicare and self-pay.  °[facility] ° [address], [place] Phone: (336) 832-3150, Fax: (336) 832-3151. Hours of Operation:  8:30 am - 5:30 pm, M-F.  Also accepts Medicaid and self-pay.  °[facility] [place] [address], [place] Phone: (336) 878-6027   °[facility] [address], [place], [place] (336)723-1848, Ext. 123 Mondays & Thursdays: 7-9 AM.  First 15 patients are seen on a first come, first serve basis. °  ° °Medicaid-accepting [place] Providers: ° °Organization         Address  Phone   Notes  °[facility] [address], [place] (336) 641-2100 Also accepts self-pay patients.  °[facility] [address], [place] ° (336) 856-9996   °[facility] [address], [place]  (336) 288-8857   °[facility] [address] [place] [address], [place] (336) 299-7000   °Veita Bland [address], [place]  ° (336) 373-1557 Only accepts [place] Access Medicaid patients after they have their name applied to their card.  ° °Self-Pay (no insurance) in [place]: ° °Organization         Address  Phone   Notes  °Sickle Cell Patients, [facility] [address], [place] (336) 832-1970   °[facility] [address], [place] (336) 832-4400   °[facility] ° [address], [place] (336) 992-4800   °[facility]/Dr. Osei-Bonsu ° [address] [place] [address], [place] or [address], [place] (336) 841-8500 Phone number for both [place] and [place] locations is the same.  °[facility] [address], [place] (336) 299-0000   °[facility] [address] [place] [address], [place] or [address] (336) 852-7530 °(336) 878-2260   °[facility] [address], [place] (336) 350-1642, phone; (336) 294-5005, fax Sees patients 1st and 3rd Saturday of every month.  Must not qualify for public or private insurance (i.e. Medicaid, Medicare, [place] Health Choice, Veterans' Benefits) • Household income should be no more than 200% of the poverty level •The clinic cannot treat you if you are pregnant or think you are pregnant • Sexually transmitted diseases are not treated at the clinic.  ° ° °Dental Care: °Organization         Address  Phone  Notes  °[place] [facility] [address], [place] (336) 641-6152 Accepts children up to age 21 who are enrolled in Medicaid or [place] Health Choice; pregnant women with a Medicaid card; and children who have applied for Medicaid or [place] Health Choice, but were declined, whose parents can pay a reduced fee at time of service.  °[place]  [facility] [place]  [address], [place] (336) 641-7733 Accepts children up to age 21 who are enrolled in Medicaid or [place] Health Choice; pregnant women with a Medicaid card; and children who have applied for Medicaid or  Health Choice, but were declined, whose parents can pay a reduced fee at time of service.  °[facility] ° [address], [place] (336) 641-4533 Patients are seen by appointment only. Walk-ins are not accepted. [facility] will see patients 18 years of age and older. °Monday - Tuesday (8am-5pm) °Most Wednesdays (8:30-5pm) °$30 per visit, cash only  °[facility] ° [address], [place] (336) 641-4533 Patients are seen by appointment only. Walk-ins are not accepted. [facility] will see patients 18 years of age and older. °One   Wednesday Evening (Monthly: Volunteer Based).  $30 per visit, cash only  °[facility]  (919) 537-3737 for adults; Children under age 4, call Graduate Pediatric Dentistry at (919) [phone]. Children aged 4-14, please call (919) [phone] to request a pediatric application. ° Dental services are provided in all areas of dental care including fillings, crowns and bridges, complete and partial dentures, implants, gum treatment, root canals, and extractions. Preventive care is also provided. Treatment is provided to both adults and children. °Patients are selected via a lottery and there is often a waiting list. °  °[facility] [address], °[place] ° (336) 763-8833 [email or web address] °  °[facility] [address], [place], [place] (336)723-1848, Ext. 123 Second and Fourth Thursday of each month, opens at 6:30 AM; Clinic ends at 9 AM.  Patients are seen on a first-come first-served basis, and a limited number are seen during each clinic.  ° °[facility] ° [address], [place], [place] (336) 723-7904    Eligibility Requirements °You must have lived in [place], [place], or [place] counties for at least the last three months. °  You cannot be eligible for state or federal sponsored healthcare insurance, including Veterans Administration, Medicaid, or Medicare. °  You generally cannot be eligible for healthcare insurance through your employer.  °  How to apply: °Eligibility screenings are held every Tuesday and Wednesday afternoon from 1:00 pm until 4:00 pm. You do not need an appointment for the interview!  °[facility] [address], [place], [place] 336-631-2330   °[facility]  336-342-8273   °[facility]  336-703-3100   °[facility]  336-570-6415   ° °Behavioral Health Resources in the Community: °Intensive Outpatient Programs °Organization         Address  Phone  Notes  °[facility] [address], [place], [place] 336-878-6098   °[facility] [address], [place], [place] 336-832-9800   °[facility] [address], [place], [place] ° 336-882-2125   °[facility] [address],  °[place], [place] 1-800-853-5163 or 336-641-4981   °Substance Abuse Resources °Organization         Address  Phone  Notes  °[facility]  336-882-2125   °[facility]  336-784-9470   °[facility]  336-285-9073   °[facility]  336-845-3988   °Residential & Outpatient Substance Abuse Program  1-800-659-3381   °Psychological Services °Organization         Address  Phone  Notes  °[facility]  336- 832-9600   °[facility]  336- 378-7881   °[facility] [address], [place] 1-800-853-5163 or 336-641-4981   ° °Mobile Crisis Teams °Organization         Address  Phone  Notes  °[facility], Mobile Crisis Care Unit  1-877-626-1772   °Assertive °[facility] ° [address]  [place], [place] 336-834-9664   °Sharon DeEsch [address] °[place] 336-554-5454   ° °Self-Help/Support Groups °Organization         Address  Phone             Notes  °[facility] - variety of support groups  336- 373-1402 Call for more information  °Narcotics Anonymous (NA), [facility] [address], °[place]  2 meetings at this location  ° °  Residential Treatment Programs °Organization         Address  Phone  Notes  °[facility] [address],    °[place]  1-866-801-8205   °[facility] ° [address], [place], [place] 704-293-8524   °[facility] [address], [place] 336-845-3988 Admissions: 8am-3pm M-F  °[facility] [address]    °[place], [place] 336-841-1104   °[facility] [address], [place], [place] 336-379-7146   °[facility] [address]  °[place], [place] 336-285-9073   °[facility] - Intensive Outpatient [address], [place], [place] 336-852-3033   °[facility] [address]  °[place], [place] 1-877-615-2722 or 336-784-9470   °[facility] [address] [place], [place] 336-227-7417 Accepts Medicaid  °[facility] [address]  °[place] 1-800-659-3381 Substance Abuse/Addiction Treatment  ° °[place] Behavioral Health Resources °Organization         Address  Phone  Notes  °[facility]  (888) 581-9988   °Julie Brannon, PhD [address] [place], [place]   (336) 349-5553 or (336) 951-0000   °[facility]   [address] °[place], [place] (336) 349-4454   °[facility] [address], [place], [place] (336) 342-8316 Insurance/Medicaid/sponsorship through [facility]  °[facility] [address]                                    [place], [place] (336) 342-8316 Therapy/tele-psych/case    °[facility] [address]  ° [place], [place] (336) 349-2233    °Dr. Arfeen  (336) 349-4544   °[facility] [place]  [employer] [place] [facility] 1) [address], [place] °2) [address], [place] °3)  [address], [place] (336) 349-3220 °(336) 342-7768 ° °(336) 342-8140   °[place] [facility] (336) 342-1394 or (336) 342-3537 (After Hours)    ° ° °
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
## 2014-03-12 ENCOUNTER — Encounter (HOSPITAL_COMMUNITY): Payer: Self-pay | Admitting: Emergency Medicine

## 2014-03-12 ENCOUNTER — Emergency Department (HOSPITAL_COMMUNITY)
Admission: EM | Admit: 2014-03-12 | Discharge: 2014-03-12 | Disposition: A | Discharge status: Home / Self Care | Payer: Medicare Other | Attending: Emergency Medicine | Admitting: Emergency Medicine

## 2014-03-12 DIAGNOSIS — Z8781 Personal history of (healed) traumatic fracture: Secondary | ICD-10-CM | POA: Diagnosis not present

## 2014-03-12 DIAGNOSIS — D649 Anemia, unspecified: Secondary | ICD-10-CM | POA: Diagnosis not present

## 2014-03-12 DIAGNOSIS — Z9852 Vasectomy status: Secondary | ICD-10-CM | POA: Diagnosis not present

## 2014-03-12 DIAGNOSIS — J449 Chronic obstructive pulmonary disease, unspecified: Secondary | ICD-10-CM | POA: Insufficient documentation

## 2014-03-12 DIAGNOSIS — Z9089 Acquired absence of other organs: Secondary | ICD-10-CM | POA: Insufficient documentation

## 2014-03-12 DIAGNOSIS — Z7952 Long term (current) use of systemic steroids: Secondary | ICD-10-CM | POA: Insufficient documentation

## 2014-03-12 DIAGNOSIS — Z880 Allergy status to penicillin: Secondary | ICD-10-CM | POA: Diagnosis not present

## 2014-03-12 DIAGNOSIS — G8929 Other chronic pain: Secondary | ICD-10-CM | POA: Insufficient documentation

## 2014-03-12 DIAGNOSIS — Z8719 Personal history of other diseases of the digestive system: Secondary | ICD-10-CM | POA: Insufficient documentation

## 2014-03-12 DIAGNOSIS — Z590 Homelessness: Secondary | ICD-10-CM | POA: Insufficient documentation

## 2014-03-12 DIAGNOSIS — E119 Type 2 diabetes mellitus without complications: Secondary | ICD-10-CM | POA: Insufficient documentation

## 2014-03-12 DIAGNOSIS — Z8659 Personal history of other mental and behavioral disorders: Secondary | ICD-10-CM | POA: Insufficient documentation

## 2014-03-12 DIAGNOSIS — Z8701 Personal history of pneumonia (recurrent): Secondary | ICD-10-CM | POA: Insufficient documentation

## 2014-03-12 DIAGNOSIS — Z79899 Other long term (current) drug therapy: Secondary | ICD-10-CM | POA: Insufficient documentation

## 2014-03-12 DIAGNOSIS — I10 Essential (primary) hypertension: Secondary | ICD-10-CM | POA: Insufficient documentation

## 2014-03-12 DIAGNOSIS — R1084 Generalized abdominal pain: Secondary | ICD-10-CM | POA: Diagnosis not present

## 2014-03-12 DIAGNOSIS — Z720 Tobacco use: Secondary | ICD-10-CM | POA: Insufficient documentation

## 2014-03-12 DIAGNOSIS — R109 Unspecified abdominal pain: Secondary | ICD-10-CM | POA: Diagnosis present

## 2014-03-12 NOTE — ED Notes (Signed)
Pt presents to ED via EMS with c/o chronic back and legs pain for months. Pt found in a parking lot by a Animal nutritionist. Pt alerts and oriented x4 at this time, airway intact.

## 2014-03-12 NOTE — Discharge Instructions (Signed)

## 2014-03-12 NOTE — ED Provider Notes (Signed)
CSN: [id number]     Arrival date & time 03/12/14  0214 History  This chart was scribe for No att. providers found by Judithann Sauger, ED Scribe. The patient was seen in room D34C/D34C and the patient's care was started at 2:51 AM.    Chief Complaint  Patient presents with  . Leg Pain  . Back Pain   HPI HPI Comments: Dale Mcfarland is a 81 y.o. male with a hx of bronchitis who presents to the Emergency Department complaining of abdominal pain. He states it is he is hungry and food will help his symptoms. Patient's denying any further complaints.  Past Medical History  Diagnosis Date  . COPD (chronic obstructive pulmonary disease)   . Chronic back pain   . Pneumonia     01/2011 - CAP vs aspiration pneuonmia  . Depression   . PONV (postoperative nausea and vomiting)   . Hyponatremia     Previously felt secondary to SIADH  . Hypertension   . Upper GI bleed     01/2011 with EGD showing severe candida esophagitis and duodenal bulb erosion  . Anemia     In the setting of UGI bleed 01/2011 requiring blood transfusion  . Homelessness   . Dyspnea     Chronic, thought due to COPD. Echo 02/2010 with EF 30-35%, nuclear study negative, then repeat echo 03/2011 did not show systolic dysfxn, so unclear if truly HF  . Bronchitis   . Diabetes mellitus without complication   . Collar bone fracture     left   Past Surgical History  Procedure Laterality Date  . Tonsillectomy    . Vasectomy    . Appendectomy    . Tonsillectomy    . Colonoscopy    . Esophagogastroduodenoscopy    . Esophagogastroduodenoscopy  02/03/2011    Procedure: ESOPHAGOGASTRODUODENOSCOPY (EGD);  Surgeon: Juanita Craver, MD;  Location: [facility];  Service: Endoscopy;  Laterality: N/A;  . Colonoscopy  01/30/2012    Procedure: COLONOSCOPY;  Surgeon: Ladene Artist, MD,FACG;  Location: [facility];  Service: Endoscopy;  Laterality: N/A;   Family History  Problem Relation Age of Onset  . Coronary artery disease Father      Starting in his 34's. Died of massive MI at age 51.  . Schizophrenia Brother   . Coronary artery disease Sister     MI at age 5  . Alzheimer's disease Mother    History  Substance Use Topics  . Smoking status: Current Some Day Smoker -- 1.00 packs/day for 60 years    Types: Cigarettes  . Smokeless tobacco: Never Used     Comment: Since age 34  . Alcohol Use: Yes     Comment: 174 ml of scotch weekly    Review of Systems    Allergies  Benadryl and Penicillins  Home Medications   Prior to Admission medications   Medication Sig Start Date End Date Taking? Authorizing Provider  amLODipine (NORVASC) 5 MG tablet Take 1 tablet (5 mg total) by mouth daily. Patient not taking: Reported on 02/27/2014 01/13/14   Debbe Odea, MD  folic acid (FOLVITE) 1 MG tablet Take 1 tablet (1 mg total) by mouth daily. Patient not taking: Reported on 02/27/2014 01/13/14   Debbe Odea, MD  guaiFENesin (MUCINEX) 600 MG 12 hr tablet Take 1 tablet (600 mg total) by mouth 2 (two) times daily as needed for to loosen phlegm. Patient not taking: Reported on 02/27/2014 01/13/14   Debbe Odea, MD  HYDROcodone-acetaminophen (NORCO/VICODIN) 5-325 MG  per tablet Take 1-2 tablets by mouth every 4 (four) hours as needed for moderate pain. Patient not taking: Reported on 02/27/2014 02/21/14   Theodis Blaze, MD  nicotine (NICODERM CQ - DOSED IN MG/24 HR) 7 mg/24hr patch Place 1 patch (7 mg total) onto the skin daily. Patient not taking: Reported on 02/27/2014 01/13/14   Debbe Odea, MD  ondansetron (ZOFRAN) 4 MG tablet Take 1 tablet (4 mg total) by mouth every 6 (six) hours as needed for nausea or vomiting. Patient not taking: Reported on 03/10/2014 2/58/52   Delora Fuel, MD  predniSONE (STERAPRED UNI-PAK) 10 MG tablet Take by mouth daily. Day 1: take 6 tabs.  Day 2: 5 tabs  Day 3: 4 tabs  Day 4: 3 tabs  Day 5: 2 tabs  Day 6: 1 tab Patient not taking: Reported on 03/01/2014 02/27/14   Clayton Bibles, PA-C  thiamine 100 MG tablet  Take 1 tablet (100 mg total) by mouth daily. Patient not taking: Reported on 02/27/2014 01/13/14   Debbe Odea, MD   BP 176/73 mmHg  Pulse 66  Temp(Src) 97.8 F (36.6 C)  Resp 16  SpO2 100% Physical Exam  Constitutional: He is oriented to person, place, and time. Vital signs are normal. He appears well-developed and well-nourished.  Non-toxic appearance. He does not appear ill. No distress.  HENT:  Head: Normocephalic and atraumatic.  Nose: Nose normal.  Mouth/Throat: Oropharynx is clear and moist. No oropharyngeal exudate.  Eyes: Conjunctivae and EOM are normal. Pupils are equal, round, and reactive to light. No scleral icterus.  Neck: Normal range of motion. Neck supple. No tracheal deviation, no edema, no erythema and normal range of motion present. No thyroid mass and no thyromegaly present.  Cardiovascular: Normal rate, regular rhythm, S1 normal, S2 normal, normal heart sounds, intact distal pulses and normal pulses.  Exam reveals no gallop and no friction rub.   No murmur heard. Pulses:      Radial pulses are 2+ on the right side, and 2+ on the left side.       Dorsalis pedis pulses are 2+ on the right side, and 2+ on the left side.  Pulmonary/Chest: Effort normal and breath sounds normal. No respiratory distress. He has no wheezes. He has no rhonchi. He has no rales.  Abdominal: Soft. Normal appearance and bowel sounds are normal. He exhibits no distension, no ascites and no mass. There is no hepatosplenomegaly. There is no tenderness. There is no rebound, no guarding and no CVA tenderness.  Musculoskeletal: Normal range of motion. He exhibits no edema or tenderness.  Lymphadenopathy:    He has no cervical adenopathy.  Neurological: He is alert and oriented to person, place, and time. He has normal strength. No cranial nerve deficit or sensory deficit.  Skin: Skin is warm, dry and intact. No petechiae and no rash noted. He is not diaphoretic. No erythema. No pallor.  Psychiatric:  He has a normal mood and affect. His behavior is normal. Judgment normal.  Nursing note and vitals reviewed.   ED Course  Procedures (including critical care time) DIAGNOSTIC STUDIES: Oxygen Saturation is 100% on room air, normal by my interpretation.    COORDINATION OF CARE: 2:53 AM- Pt advised of plan for treatment and pt agrees.    Labs Review Labs Reviewed - No data to display  Imaging Review No results found.   EKG Interpretation None      MDM   Final diagnoses:  None    Patient presents  emergency department for abdominal pain secondary to hunger. He is denying any other symptoms. Physical exam is unremarkable. Patient was given a meal and he'll be discharged. His vital signs remain within his normal limits. Primary care follow-up was provided.   I personally performed the services described in this documentation, which was scribed in my presence. The recorded information has been reviewed and is accurate.     Everlene Balls, MD 03/12/14 [phone]

## 2014-03-21 ENCOUNTER — Encounter (HOSPITAL_COMMUNITY): Payer: Self-pay

## 2014-03-21 ENCOUNTER — Emergency Department (HOSPITAL_COMMUNITY): Payer: Medicare Other

## 2014-03-21 ENCOUNTER — Emergency Department (HOSPITAL_COMMUNITY)
Admission: EM | Admit: 2014-03-21 | Discharge: 2014-03-22 | Disposition: A | Discharge status: Home / Self Care | Payer: Medicare Other | Attending: Emergency Medicine | Admitting: Emergency Medicine

## 2014-03-21 DIAGNOSIS — R531 Weakness: Secondary | ICD-10-CM | POA: Diagnosis present

## 2014-03-21 DIAGNOSIS — R1013 Epigastric pain: Secondary | ICD-10-CM | POA: Diagnosis not present

## 2014-03-21 DIAGNOSIS — R059 Cough, unspecified: Secondary | ICD-10-CM

## 2014-03-21 DIAGNOSIS — Z8719 Personal history of other diseases of the digestive system: Secondary | ICD-10-CM | POA: Diagnosis not present

## 2014-03-21 DIAGNOSIS — Z862 Personal history of diseases of the blood and blood-forming organs and certain disorders involving the immune mechanism: Secondary | ICD-10-CM | POA: Insufficient documentation

## 2014-03-21 DIAGNOSIS — R05 Cough: Secondary | ICD-10-CM | POA: Diagnosis not present

## 2014-03-21 DIAGNOSIS — J449 Chronic obstructive pulmonary disease, unspecified: Secondary | ICD-10-CM | POA: Insufficient documentation

## 2014-03-21 DIAGNOSIS — R1110 Vomiting, unspecified: Secondary | ICD-10-CM | POA: Insufficient documentation

## 2014-03-21 DIAGNOSIS — Z880 Allergy status to penicillin: Secondary | ICD-10-CM | POA: Diagnosis not present

## 2014-03-21 DIAGNOSIS — Z8701 Personal history of pneumonia (recurrent): Secondary | ICD-10-CM | POA: Diagnosis not present

## 2014-03-21 DIAGNOSIS — I10 Essential (primary) hypertension: Secondary | ICD-10-CM | POA: Insufficient documentation

## 2014-03-21 DIAGNOSIS — Z5900 Homelessness unspecified: Secondary | ICD-10-CM

## 2014-03-21 DIAGNOSIS — Z720 Tobacco use: Secondary | ICD-10-CM | POA: Diagnosis not present

## 2014-03-21 DIAGNOSIS — E119 Type 2 diabetes mellitus without complications: Secondary | ICD-10-CM | POA: Diagnosis not present

## 2014-03-21 DIAGNOSIS — Z8781 Personal history of (healed) traumatic fracture: Secondary | ICD-10-CM | POA: Insufficient documentation

## 2014-03-21 DIAGNOSIS — G8929 Other chronic pain: Secondary | ICD-10-CM | POA: Diagnosis not present

## 2014-03-21 LAB — LIPASE, BLOOD: Lipase: 31 U/L (ref 11–59)

## 2014-03-21 LAB — URINALYSIS, ROUTINE W REFLEX MICROSCOPIC
Glucose, UA: NEGATIVE mg/dL
Hgb urine dipstick: NEGATIVE
KETONES UR: NEGATIVE mg/dL
Leukocytes, UA: NEGATIVE
NITRITE: NEGATIVE
Protein, ur: NEGATIVE mg/dL
Specific Gravity, Urine: 1.025 (ref 1.005–1.030)
Urobilinogen, UA: 1 mg/dL (ref 0.0–1.0)
pH: 5.5 (ref 5.0–8.0)

## 2014-03-21 LAB — COMPREHENSIVE METABOLIC PANEL
ALBUMIN: 3.4 g/dL — AB (ref 3.5–5.2)
ALK PHOS: 114 U/L (ref 39–117)
ALT: 14 U/L (ref 0–53)
AST: 22 U/L (ref 0–37)
Anion gap: 8 (ref 5–15)
BUN: 22 mg/dL (ref 6–23)
CALCIUM: 8.7 mg/dL (ref 8.4–10.5)
CHLORIDE: 108 mmol/L (ref 96–112)
CO2: 24 mmol/L (ref 19–32)
CREATININE: 1.24 mg/dL (ref 0.50–1.35)
GFR calc Af Amer: 61 mL/min — ABNORMAL LOW (ref >90)
GFR calc non Af Amer: 52 mL/min — ABNORMAL LOW (ref >90)
Glucose, Bld: 81 mg/dL (ref 70–99)
POTASSIUM: 3.8 mmol/L (ref 3.5–5.1)
SODIUM: 140 mmol/L (ref 135–145)
TOTAL PROTEIN: 6.8 g/dL (ref 6.0–8.3)
Total Bilirubin: 0.5 mg/dL (ref 0.3–1.2)

## 2014-03-21 LAB — CBC WITH DIFFERENTIAL/PLATELET
BASOS PCT: 1 % (ref 0–1)
Basophils Absolute: 0.1 10*3/uL (ref 0.0–0.1)
Eosinophils Absolute: 0.3 10*3/uL (ref 0.0–0.7)
Eosinophils Relative: 3 % (ref 0–5)
HCT: 33.3 % — ABNORMAL LOW (ref 39.0–52.0)
HEMOGLOBIN: 10.2 g/dL — AB (ref 13.0–17.0)
LYMPHS ABS: 1.2 10*3/uL (ref 0.7–4.0)
LYMPHS PCT: 14 % (ref 12–46)
MCH: 26.4 pg (ref 26.0–34.0)
MCHC: 30.6 g/dL (ref 30.0–36.0)
MCV: 86 fL (ref 78.0–100.0)
MONOS PCT: 8 % (ref 3–12)
Monocytes Absolute: 0.7 10*3/uL (ref 0.1–1.0)
NEUTROS ABS: 6.2 10*3/uL (ref 1.7–7.7)
NEUTROS PCT: 74 % (ref 43–77)
PLATELETS: 392 10*3/uL (ref 150–400)
RBC: 3.87 MIL/uL — ABNORMAL LOW (ref 4.22–5.81)
RDW: 15.4 % (ref 11.5–15.5)
WBC: 8.4 10*3/uL (ref 4.0–10.5)

## 2014-03-21 LAB — BRAIN NATRIURETIC PEPTIDE: B NATRIURETIC PEPTIDE 5: 333.1 pg/mL — AB (ref 0.0–100.0)

## 2014-03-21 MED ORDER — GI COCKTAIL ~~LOC~~
30.0000 mL | Freq: Once | ORAL | Status: AC
  Administered 2014-03-21: 30 mL via ORAL
  Filled 2014-03-21: qty 30

## 2014-03-21 MED ORDER — ONDANSETRON 4 MG PO TBDP
4.0000 mg | ORAL_TABLET | Freq: Once | ORAL | Status: AC
  Administered 2014-03-21: 4 mg via ORAL
  Filled 2014-03-21: qty 1

## 2014-03-21 MED ORDER — ALBUTEROL SULFATE HFA 108 (90 BASE) MCG/ACT IN AERS
2.0000 | INHALATION_SPRAY | Freq: Once | RESPIRATORY_TRACT | Status: AC
  Administered 2014-03-21: 2 via RESPIRATORY_TRACT
  Filled 2014-03-21: qty 6.7

## 2014-03-21 NOTE — ED Notes (Signed)
Security at bedside for assistance.

## 2014-03-21 NOTE — ED Notes (Signed)
Pt refusing to leave - continues to request more food after pt has been supplied w/ multiple food items throughout stay.

## 2014-03-21 NOTE — Discharge Instructions (Signed)

## 2014-03-21 NOTE — ED Notes (Signed)
Pt presents via EMS with c/o generalized weakness, diffuse abdominal pain, vomiting and diarrhea, and symptoms of what pt reports to be bronchitis. Pt reports he develops pneumonia on occasion and feels like he is going to get it again soon.

## 2014-03-21 NOTE — ED Notes (Signed)
Pt ambulating independently w/ steady gait on d/c in no acute distress, A&Ox4. D/c instructions reviewed w/ pt - pt denies any further questions or concerns at present.  

## 2014-03-21 NOTE — Progress Notes (Signed)
  CARE MANAGEMENT ED NOTE 03/21/2014  Patient:  Dale Mcfarland, Dale Mcfarland   Account Number:  [id number]  Date Initiated:  03/21/2014  Documentation initiated by:  Livia Snellen  Subjective/Objective Assessment:   Patient presents to Ed with generalized weakness, n/v diarrhea     Subjective/Objective Assessment Detail:   Patient noted to have 10 ED visits within the last six months     Action/Plan:   Action/Plan Detail:   Anticipated DC Date:       Status Recommendation to Physician:   Result of Recommendation:    Other ED [place]  Other  PCP issues    Choice offered to / List presented to:            Status of service:  Completed, signed off  ED Comments:   ED Comments Detail:  EDCM spoke to patient at bedside.  Patient confirms he is hoemless with [employer].  EDCM asked patient if he has a pcp?  Patient responded, "I go to all the clinics when I get sick."  I only go to the doctor when I need to." [facility] explained to patient the importance and purpose of having a pcp and that the ED is for emergencies only. Patient reports he is familiar with the [facility].  'I go to breakfast and lunch there. I get there when I can."  EDCM explained to patient that if becomes affliliated with the [facility], he may get his prescriptions form the [facility] for free.  Patient was unaware of this.  Patient reports he is not taking any medication anyway.  No further EDCM needs at this time.

## 2014-03-21 NOTE — Progress Notes (Signed)
CSW was notified by Nurse CM that patient is homeless. CSW met with patient at bedside. There was no family present. Patient informed CSW that he was BIB ambulance. Per note, pt c/o generalized weakness, diffuse abdominal pain, vomiting and diarrhea, and symptoms of what pt reports to be bronchitis.   Pt confirms that he is homeless. Patient states that he lives on the streets. Patient stated "wherever I stop, I sleep there". Patient states that he is not feeling well. Per patient, his head, stomach, and feet are hurting.   Patient states that he does not enjoy the cold food. Nurse and nurse tech came to bedside. They are aware of pt complaint.  CSW will give patient information regarding shelters and food.  Willette Brace [phone] ED CSW 03/21/2014 8:19 PM

## 2014-03-21 NOTE — ED Notes (Signed)
Pt states he is unable to urinate at this time. Requisition accidentally clicked off by this Probation officer.

## 2014-03-21 NOTE — ED Provider Notes (Signed)
CSN: [id number]     Arrival date & time 03/21/14  15 History   First MD Initiated Contact with Patient 03/21/14 1731     Chief Complaint  Patient presents with  . Emesis  . Weakness  . Diarrhea     (Consider location/radiation/quality/duration/timing/severity/associated sxs/prior Treatment) Patient is a 81 y.o. male presenting with vomiting.  Emesis Severity:  Moderate Duration:  2 months Timing:  Intermittent Quality:  Stomach contents Progression:  Unchanged Chronicity:  Recurrent Context comment:  "I just have a weak stomach" Relieved by:  Nothing Exacerbated by: Eating. Associated symptoms: no fever     Past Medical History  Diagnosis Date  . COPD (chronic obstructive pulmonary disease)   . Chronic back pain   . Pneumonia     01/2011 - CAP vs aspiration pneuonmia  . Depression   . PONV (postoperative nausea and vomiting)   . Hyponatremia     Previously felt secondary to SIADH  . Hypertension   . Upper GI bleed     01/2011 with EGD showing severe candida esophagitis and duodenal bulb erosion  . Anemia     In the setting of UGI bleed 01/2011 requiring blood transfusion  . Homelessness   . Dyspnea     Chronic, thought due to COPD. Echo 02/2010 with EF 30-35%, nuclear study negative, then repeat echo 03/2011 did not show systolic dysfxn, so unclear if truly HF  . Bronchitis   . Diabetes mellitus without complication   . Collar bone fracture     left   Past Surgical History  Procedure Laterality Date  . Tonsillectomy    . Vasectomy    . Appendectomy    . Tonsillectomy    . Colonoscopy    . Esophagogastroduodenoscopy    . Esophagogastroduodenoscopy  02/03/2011    Procedure: ESOPHAGOGASTRODUODENOSCOPY (EGD);  Surgeon: Juanita Craver, MD;  Location: [facility];  Service: Endoscopy;  Laterality: N/A;  . Colonoscopy  01/30/2012    Procedure: COLONOSCOPY;  Surgeon: Ladene Artist, MD,FACG;  Location: [facility];  Service: Endoscopy;  Laterality: N/A;   Family  History  Problem Relation Age of Onset  . Coronary artery disease Father     Starting in his 52's. Died of massive MI at age 45.  . Schizophrenia Brother   . Coronary artery disease Sister     MI at age 66  . Alzheimer's disease Mother    History  Substance Use Topics  . Smoking status: Current Some Day Smoker -- 1.00 packs/day for 60 years    Types: Cigarettes  . Smokeless tobacco: Never Used     Comment: Since age 58  . Alcohol Use: Yes     Comment: 174 ml of scotch weekly    Review of Systems  Gastrointestinal: Positive for vomiting.  All other systems reviewed and are negative.     Allergies  Benadryl and Penicillins  Home Medications   Prior to Admission medications   Medication Sig Start Date End Date Taking? Authorizing Provider  amLODipine (NORVASC) 5 MG tablet Take 1 tablet (5 mg total) by mouth daily. Patient not taking: Reported on 02/27/2014 01/13/14   Debbe Odea, MD  folic acid (FOLVITE) 1 MG tablet Take 1 tablet (1 mg total) by mouth daily. Patient not taking: Reported on 02/27/2014 01/13/14   Debbe Odea, MD  guaiFENesin (MUCINEX) 600 MG 12 hr tablet Take 1 tablet (600 mg total) by mouth 2 (two) times daily as needed for to loosen phlegm. Patient not taking: Reported on  02/27/2014 01/13/14   Debbe Odea, MD  HYDROcodone-acetaminophen (NORCO/VICODIN) 5-325 MG per tablet Take 1-2 tablets by mouth every 4 (four) hours as needed for moderate pain. Patient not taking: Reported on 02/27/2014 02/21/14   Theodis Blaze, MD  nicotine (NICODERM CQ - DOSED IN MG/24 HR) 7 mg/24hr patch Place 1 patch (7 mg total) onto the skin daily. Patient not taking: Reported on 02/27/2014 01/13/14   Debbe Odea, MD  ondansetron (ZOFRAN) 4 MG tablet Take 1 tablet (4 mg total) by mouth every 6 (six) hours as needed for nausea or vomiting. Patient not taking: Reported on 03/10/2014 6/96/29   Delora Fuel, MD  predniSONE (STERAPRED UNI-PAK) 10 MG tablet Take by mouth daily. Day 1: take 6 tabs.   Day 2: 5 tabs  Day 3: 4 tabs  Day 4: 3 tabs  Day 5: 2 tabs  Day 6: 1 tab Patient not taking: Reported on 03/01/2014 02/27/14   Clayton Bibles, PA-C  thiamine 100 MG tablet Take 1 tablet (100 mg total) by mouth daily. Patient not taking: Reported on 02/27/2014 01/13/14   Debbe Odea, MD   BP 134/53 mmHg  Pulse 81  Temp(Src) 98 F (36.7 C) (Oral)  Resp 16  SpO2 100% Physical Exam  Constitutional: He is oriented to person, place, and time. He appears well-developed and well-nourished. No distress.  HENT:  Head: Normocephalic and atraumatic.  Mouth/Throat: Oropharynx is clear and moist.  Eyes: Conjunctivae are normal. Pupils are equal, round, and reactive to light. No scleral icterus.  Neck: Neck supple.  Cardiovascular: Normal rate, regular rhythm, normal heart sounds and intact distal pulses.   No murmur heard. Pulmonary/Chest: Effort normal and breath sounds normal. No stridor. No respiratory distress. He has no wheezes. He has no rales.  Abdominal: Soft. He exhibits no distension. There is no tenderness. There is no rebound and no guarding.  Musculoskeletal: Normal range of motion. He exhibits no edema.  Neurological: He is alert and oriented to person, place, and time.  Skin: Skin is warm and dry. No rash noted.  Psychiatric: He has a normal mood and affect. His behavior is normal.  Nursing note and vitals reviewed.   ED Course  Procedures (including critical care time) Labs Review Labs Reviewed  CBC WITH DIFFERENTIAL/PLATELET - Abnormal; Notable for the following:    RBC 3.87 (*)    Hemoglobin 10.2 (*)    HCT 33.3 (*)    All other components within normal limits  COMPREHENSIVE METABOLIC PANEL - Abnormal; Notable for the following:    Albumin 3.4 (*)    GFR calc non Af Amer 52 (*)    GFR calc Af Amer 61 (*)    All other components within normal limits  LIPASE, BLOOD  URINALYSIS, ROUTINE W REFLEX MICROSCOPIC    Imaging Review Dg Chest Port 1 View  03/21/2014   CLINICAL DATA:   Chest pain, cough with congestion, shortness of breath. Smoker.  EXAM: PORTABLE CHEST - 1 VIEW  COMPARISON:  03/05/2014  FINDINGS: Shallow inspiration. Mild cardiac enlargement with diffuse vascular congestion. Probable underlying interstitial process which could represent interstitial edema or fibrosis. No focal consolidation or airspace disease. No blunting of costophrenic angles. No pneumothorax. Old bilateral rib fractures. Old left clavicular fracture.  IMPRESSION: Cardiac enlargement with mild pulmonary vascular congestion. Interstitial changes may represent chronic fibrosis or acute interstitial edema or pneumonitis. Interstitial pattern demonstrates mild progression since previous study.   Electronically Signed   By: Lucienne Capers M.D.   On:  03/21/2014 18:13  All radiology studies independently viewed by me.      EKG Interpretation None      MDM   Final diagnoses:  Cough  Epigastric pain  Homelessness    81 year old homeless gentleman who presents with epigastric abdominal pain and recurrent vomiting. When asked the reason that he came to the emergency room today, he states "I know I'm hungry," and later states "I'm old and I'm cold."  Patient is homeless with frequent emergency department visits. However, he is generally well-appearing, nontoxic, abdomen is soft and nontender without rebound, rigidity, or guarding. He complains that he was coughing earlier today, but not currently.  Plan GI cocktail, Zofran, labs, feed, and reevaluate.  Repeat abdominal exam showed soft nontender abdomen.  He tolerated PO food and fluids.  His CXR showed some increased interstitial markings.  He had no peripheral edema and no hypoxia at rest or exertion.  He did have some mild wheezing and he requested an inhaler since he had run out of previous inhalers.  He appeared stable for discharge with outpatient follow up.  Return precautions given.    Houston Siren III, MD 03/22/14 [phone]

## 2014-03-22 NOTE — ED Notes (Signed)
Off duty [employer] officer at bedside - offering pt a ride to the [employer], pt agreeable to plan.

## 2014-03-31 ENCOUNTER — Emergency Department (HOSPITAL_COMMUNITY)
Admission: EM | Admit: 2014-03-31 | Discharge: 2014-03-31 | Disposition: A | Discharge status: Home / Self Care | Payer: Medicare Other | Attending: Emergency Medicine | Admitting: Emergency Medicine

## 2014-03-31 ENCOUNTER — Encounter (HOSPITAL_COMMUNITY): Payer: Self-pay | Admitting: Emergency Medicine

## 2014-03-31 DIAGNOSIS — Z880 Allergy status to penicillin: Secondary | ICD-10-CM | POA: Insufficient documentation

## 2014-03-31 DIAGNOSIS — Z8781 Personal history of (healed) traumatic fracture: Secondary | ICD-10-CM | POA: Diagnosis not present

## 2014-03-31 DIAGNOSIS — Z8719 Personal history of other diseases of the digestive system: Secondary | ICD-10-CM | POA: Insufficient documentation

## 2014-03-31 DIAGNOSIS — E119 Type 2 diabetes mellitus without complications: Secondary | ICD-10-CM | POA: Diagnosis not present

## 2014-03-31 DIAGNOSIS — Z8701 Personal history of pneumonia (recurrent): Secondary | ICD-10-CM | POA: Diagnosis not present

## 2014-03-31 DIAGNOSIS — Z8659 Personal history of other mental and behavioral disorders: Secondary | ICD-10-CM | POA: Insufficient documentation

## 2014-03-31 DIAGNOSIS — M79601 Pain in right arm: Secondary | ICD-10-CM

## 2014-03-31 DIAGNOSIS — D649 Anemia, unspecified: Secondary | ICD-10-CM | POA: Insufficient documentation

## 2014-03-31 DIAGNOSIS — M79621 Pain in right upper arm: Secondary | ICD-10-CM | POA: Insufficient documentation

## 2014-03-31 DIAGNOSIS — I10 Essential (primary) hypertension: Secondary | ICD-10-CM | POA: Diagnosis not present

## 2014-03-31 DIAGNOSIS — J449 Chronic obstructive pulmonary disease, unspecified: Secondary | ICD-10-CM | POA: Insufficient documentation

## 2014-03-31 DIAGNOSIS — Z590 Homelessness: Secondary | ICD-10-CM | POA: Diagnosis not present

## 2014-03-31 DIAGNOSIS — R110 Nausea: Secondary | ICD-10-CM | POA: Diagnosis not present

## 2014-03-31 DIAGNOSIS — Z720 Tobacco use: Secondary | ICD-10-CM | POA: Insufficient documentation

## 2014-03-31 DIAGNOSIS — Z7952 Long term (current) use of systemic steroids: Secondary | ICD-10-CM | POA: Diagnosis not present

## 2014-03-31 DIAGNOSIS — G8929 Other chronic pain: Secondary | ICD-10-CM | POA: Diagnosis not present

## 2014-03-31 LAB — CBG MONITORING, ED: GLUCOSE-CAPILLARY: 114 mg/dL — AB (ref 70–99)

## 2014-03-31 MED ORDER — ONDANSETRON 4 MG PO TBDP
4.0000 mg | ORAL_TABLET | Freq: Once | ORAL | Status: AC
  Administered 2014-03-31: 4 mg via ORAL
  Filled 2014-03-31: qty 1

## 2014-03-31 NOTE — ED Notes (Signed)
PT escorted from triage via security

## 2014-03-31 NOTE — ED Notes (Signed)
PT yelling and hollering at staff; pt states "You can't discharge me, It's cold outside"; pt advised that that he is being discharged and need to leave the property; pt states "take me to jail"; pt continues to be verbally aggressive with staff; Security at bedside

## 2014-03-31 NOTE — ED Provider Notes (Signed)
CSN: [id number]     Arrival date & time 03/31/14  1818 History   First MD Initiated Contact with Patient 03/31/14 1921     Chief Complaint  Patient presents with  . Nausea     (Consider location/radiation/quality/duration/timing/severity/associated sxs/prior Treatment) Patient is a 81 y.o. male presenting with arm injury. The history is provided by the patient.  Arm Injury Location:  Arm Time since incident:  1 week Injury: no   Arm location:  R arm Pain details:    Quality:  Aching   Radiates to:  Does not radiate   Severity:  Mild   Onset quality:  Gradual   Duration:  1 week   Timing:  Constant   Progression:  Unchanged Chronicity:  New Dislocation: no   Foreign body present:  No foreign bodies Prior injury to area:  No Relieved by:  Nothing Worsened by:  Nothing tried Ineffective treatments:  None tried Associated symptoms: no fever and no neck pain     Past Medical History  Diagnosis Date  . COPD (chronic obstructive pulmonary disease)   . Chronic back pain   . Pneumonia     01/2011 - CAP vs aspiration pneuonmia  . Depression   . PONV (postoperative nausea and vomiting)   . Hyponatremia     Previously felt secondary to SIADH  . Hypertension   . Upper GI bleed     01/2011 with EGD showing severe candida esophagitis and duodenal bulb erosion  . Anemia     In the setting of UGI bleed 01/2011 requiring blood transfusion  . Homelessness   . Dyspnea     Chronic, thought due to COPD. Echo 02/2010 with EF 30-35%, nuclear study negative, then repeat echo 03/2011 did not show systolic dysfxn, so unclear if truly HF  . Bronchitis   . Diabetes mellitus without complication   . Collar bone fracture     left   Past Surgical History  Procedure Laterality Date  . Tonsillectomy    . Vasectomy    . Appendectomy    . Tonsillectomy    . Colonoscopy    . Esophagogastroduodenoscopy    . Esophagogastroduodenoscopy  02/03/2011    Procedure: ESOPHAGOGASTRODUODENOSCOPY  (EGD);  Surgeon: Juanita Craver, MD;  Location: [facility];  Service: Endoscopy;  Laterality: N/A;  . Colonoscopy  01/30/2012    Procedure: COLONOSCOPY;  Surgeon: Ladene Artist, MD,FACG;  Location: [facility];  Service: Endoscopy;  Laterality: N/A;   Family History  Problem Relation Age of Onset  . Coronary artery disease Father     Starting in his 70's. Died of massive MI at age 51.  . Schizophrenia Brother   . Coronary artery disease Sister     MI at age 42  . Alzheimer's disease Mother    History  Substance Use Topics  . Smoking status: Current Some Day Smoker -- 1.00 packs/day for 60 years    Types: Cigarettes  . Smokeless tobacco: Never Used     Comment: Since age 43  . Alcohol Use: Yes     Comment: 174 ml of scotch weekly    Review of Systems  Constitutional: Negative for fever.  HENT: Negative for drooling and rhinorrhea.   Eyes: Negative for pain.  Respiratory: Negative for cough and shortness of breath.   Cardiovascular: Negative for chest pain and leg swelling.  Gastrointestinal: Positive for nausea. Negative for vomiting, abdominal pain and diarrhea.  Genitourinary: Negative for dysuria and hematuria.  Musculoskeletal: Negative for gait problem  and neck pain.       Arm pain  Skin: Negative for color change.  Neurological: Negative for numbness and headaches.  Hematological: Negative for adenopathy.  Psychiatric/Behavioral: Negative for behavioral problems.  All other systems reviewed and are negative.     Allergies  Benadryl and Penicillins  Home Medications   Prior to Admission medications   Medication Sig Start Date End Date Taking? Authorizing Provider  amLODipine (NORVASC) 5 MG tablet Take 1 tablet (5 mg total) by mouth daily. Patient not taking: Reported on 02/27/2014 01/13/14   Debbe Odea, MD  folic acid (FOLVITE) 1 MG tablet Take 1 tablet (1 mg total) by mouth daily. Patient not taking: Reported on 02/27/2014 01/13/14   Debbe Odea, MD   guaiFENesin (MUCINEX) 600 MG 12 hr tablet Take 1 tablet (600 mg total) by mouth 2 (two) times daily as needed for to loosen phlegm. Patient not taking: Reported on 02/27/2014 01/13/14   Debbe Odea, MD  HYDROcodone-acetaminophen (NORCO/VICODIN) 5-325 MG per tablet Take 1-2 tablets by mouth every 4 (four) hours as needed for moderate pain. Patient not taking: Reported on 02/27/2014 02/21/14   Theodis Blaze, MD  nicotine (NICODERM CQ - DOSED IN MG/24 HR) 7 mg/24hr patch Place 1 patch (7 mg total) onto the skin daily. Patient not taking: Reported on 02/27/2014 01/13/14   Debbe Odea, MD  ondansetron (ZOFRAN) 4 MG tablet Take 1 tablet (4 mg total) by mouth every 6 (six) hours as needed for nausea or vomiting. Patient not taking: Reported on 03/10/2014 9/50/93   Delora Fuel, MD  predniSONE (STERAPRED UNI-PAK) 10 MG tablet Take by mouth daily. Day 1: take 6 tabs.  Day 2: 5 tabs  Day 3: 4 tabs  Day 4: 3 tabs  Day 5: 2 tabs  Day 6: 1 tab Patient not taking: Reported on 03/01/2014 02/27/14   Clayton Bibles, PA-C  thiamine 100 MG tablet Take 1 tablet (100 mg total) by mouth daily. Patient not taking: Reported on 02/27/2014 01/13/14   Debbe Odea, MD   BP 188/75 mmHg  Pulse 86  Temp(Src) 98.7 F (37.1 C) (Oral)  Resp 16  SpO2 100% Physical Exam  Constitutional: He is oriented to person, place, and time. He appears well-developed and well-nourished.  HENT:  Head: Normocephalic and atraumatic.  Right Ear: External ear normal.  Left Ear: External ear normal.  Nose: Nose normal.  Mouth/Throat: Oropharynx is clear and moist. No oropharyngeal exudate.  Eyes: Conjunctivae and EOM are normal. Pupils are equal, round, and reactive to light.  Neck: Normal range of motion. Neck supple.  Cardiovascular: Normal rate, regular rhythm, normal heart sounds and intact distal pulses.  Exam reveals no gallop and no friction rub.   No murmur heard. Pulmonary/Chest: Effort normal and breath sounds normal. No respiratory  distress. He has no wheezes.  Abdominal: Soft. Bowel sounds are normal. He exhibits no distension. There is no tenderness. There is no rebound and no guarding.  Musculoskeletal: Normal range of motion. He exhibits no edema or tenderness.  Normal symmetric-appearing upper extremities.  Normal sensation and strength in the upper extremities.  2+ distal and proximal pulses in the upper extremities which are equal bilaterally.  Tenderness to palpation nonspecifically of the right upper arm.  Pain appears to be reproduced with flexion and extension of the bicep.  Neurological: He is alert and oriented to person, place, and time.  Skin: Skin is warm and dry.  Psychiatric: He has a normal mood and affect. His behavior  is normal.  Nursing note and vitals reviewed.   ED Course  Procedures (including critical care time) Labs Review Labs Reviewed  CBG MONITORING, ED - Abnormal; Notable for the following:    Glucose-Capillary 114 (*)    All other components within normal limits    Imaging Review No results found.   EKG Interpretation   Date/Time:  Thursday March 31 2014 19:35:31 EST Ventricular Rate:  80 PR Interval:  179 QRS Duration: 90 QT Interval:  431 QTC Calculation: 497 R Axis:   59 Text Interpretation:  Sinus rhythm Abnormal R-wave progression, early  transition Baseline wander in lead(s) I III aVL aVF No significant change  since last tracing Confirmed by Anna Beaird  MD, Sally Reimers ([id number]) on 03/31/2014  7:42:00 PM      MDM   Final diagnoses:  Right arm pain    7:32 PM 81 y.o. male with history of homelessness who presents with right arm pain for the last week. He also notes some mild nausea which began today. He is hungry on exam and is requesting coffee and soup. He is afebrile and mildly hypertensive here. Vital signs otherwise unremarkable. Patient is well-appearing on exam. He has reproducible tenderness of the right arm with palpation. Normal strength and sensation  in the upper extremities with 2+ distal pulses. Pain is reproducible and atypical for cardiac. Likely skin skeletal in nature. We'll get screening CBG, EKG, Zofran and give him something to eat.  7:54 PM: ECG non-contrib. BS normal. Pt tolerating po. Do not think this is ACS.  I have discussed the diagnosis/risks/treatment options with the patient and believe the pt to be eligible for discharge home to follow-up with the wellness center. We also discussed returning to the ED immediately if new or worsening sx occur. We discussed the sx which are most concerning (e.g., worsening pain, fever, sob, cp) that necessitate immediate return. Medications administered to the patient during their visit and any new prescriptions provided to the patient are listed below.  Medications given during this visit Medications  ondansetron (ZOFRAN-ODT) disintegrating tablet 4 mg (4 mg Oral Given 03/31/14 1942)    New Prescriptions   No medications on file     Pamella Pert, MD 03/31/14 1955

## 2014-03-31 NOTE — ED Notes (Signed)
Pt c/o nausea x5 days. VS: BP 160/90 HR 80 Regular.

## 2014-04-17 ENCOUNTER — Emergency Department (HOSPITAL_COMMUNITY): Payer: Medicare Other

## 2014-04-17 ENCOUNTER — Encounter (HOSPITAL_COMMUNITY): Payer: Self-pay | Admitting: Emergency Medicine

## 2014-04-17 ENCOUNTER — Inpatient Hospital Stay (HOSPITAL_COMMUNITY)
Admission: EM | Admit: 2014-04-17 | Discharge: 2014-04-22 | DRG: 380 | Disposition: A | Discharge status: Home / Self Care | Payer: Medicare Other | Attending: Family Medicine | Admitting: Family Medicine

## 2014-04-17 DIAGNOSIS — D509 Iron deficiency anemia, unspecified: Secondary | ICD-10-CM | POA: Diagnosis present

## 2014-04-17 DIAGNOSIS — M549 Dorsalgia, unspecified: Secondary | ICD-10-CM | POA: Diagnosis present

## 2014-04-17 DIAGNOSIS — I4891 Unspecified atrial fibrillation: Secondary | ICD-10-CM | POA: Diagnosis present

## 2014-04-17 DIAGNOSIS — Z8701 Personal history of pneumonia (recurrent): Secondary | ICD-10-CM | POA: Diagnosis not present

## 2014-04-17 DIAGNOSIS — K209 Esophagitis, unspecified without bleeding: Secondary | ICD-10-CM

## 2014-04-17 DIAGNOSIS — D123 Benign neoplasm of transverse colon: Secondary | ICD-10-CM | POA: Insufficient documentation

## 2014-04-17 DIAGNOSIS — F1010 Alcohol abuse, uncomplicated: Secondary | ICD-10-CM | POA: Diagnosis present

## 2014-04-17 DIAGNOSIS — K921 Melena: Secondary | ICD-10-CM | POA: Diagnosis present

## 2014-04-17 DIAGNOSIS — Z888 Allergy status to other drugs, medicaments and biological substances status: Secondary | ICD-10-CM | POA: Diagnosis not present

## 2014-04-17 DIAGNOSIS — I700 Atherosclerosis of aorta: Secondary | ICD-10-CM | POA: Diagnosis present

## 2014-04-17 DIAGNOSIS — K2980 Duodenitis without bleeding: Secondary | ICD-10-CM | POA: Insufficient documentation

## 2014-04-17 DIAGNOSIS — Z681 Body mass index (BMI) 19 or less, adult: Secondary | ICD-10-CM

## 2014-04-17 DIAGNOSIS — G8929 Other chronic pain: Secondary | ICD-10-CM | POA: Diagnosis present

## 2014-04-17 DIAGNOSIS — E876 Hypokalemia: Secondary | ICD-10-CM | POA: Diagnosis not present

## 2014-04-17 DIAGNOSIS — Z860100 Personal history of colon polyps, unspecified: Secondary | ICD-10-CM | POA: Insufficient documentation

## 2014-04-17 DIAGNOSIS — I5022 Chronic systolic (congestive) heart failure: Secondary | ICD-10-CM | POA: Diagnosis present

## 2014-04-17 DIAGNOSIS — Z590 Homelessness: Secondary | ICD-10-CM | POA: Diagnosis not present

## 2014-04-17 DIAGNOSIS — E43 Unspecified severe protein-calorie malnutrition: Secondary | ICD-10-CM | POA: Diagnosis present

## 2014-04-17 DIAGNOSIS — J8410 Pulmonary fibrosis, unspecified: Secondary | ICD-10-CM | POA: Diagnosis present

## 2014-04-17 DIAGNOSIS — D122 Benign neoplasm of ascending colon: Secondary | ICD-10-CM | POA: Diagnosis present

## 2014-04-17 DIAGNOSIS — K2211 Ulcer of esophagus with bleeding: Principal | ICD-10-CM | POA: Diagnosis present

## 2014-04-17 DIAGNOSIS — Z79899 Other long term (current) drug therapy: Secondary | ICD-10-CM

## 2014-04-17 DIAGNOSIS — F329 Major depressive disorder, single episode, unspecified: Secondary | ICD-10-CM | POA: Diagnosis present

## 2014-04-17 DIAGNOSIS — I10 Essential (primary) hypertension: Secondary | ICD-10-CM | POA: Diagnosis present

## 2014-04-17 DIAGNOSIS — K5641 Fecal impaction: Secondary | ICD-10-CM | POA: Diagnosis present

## 2014-04-17 DIAGNOSIS — J449 Chronic obstructive pulmonary disease, unspecified: Secondary | ICD-10-CM | POA: Diagnosis present

## 2014-04-17 DIAGNOSIS — I708 Atherosclerosis of other arteries: Secondary | ICD-10-CM | POA: Diagnosis present

## 2014-04-17 DIAGNOSIS — D124 Benign neoplasm of descending colon: Secondary | ICD-10-CM | POA: Diagnosis present

## 2014-04-17 DIAGNOSIS — D125 Benign neoplasm of sigmoid colon: Secondary | ICD-10-CM | POA: Insufficient documentation

## 2014-04-17 DIAGNOSIS — K922 Gastrointestinal hemorrhage, unspecified: Secondary | ICD-10-CM | POA: Diagnosis present

## 2014-04-17 DIAGNOSIS — K8020 Calculus of gallbladder without cholecystitis without obstruction: Secondary | ICD-10-CM | POA: Diagnosis present

## 2014-04-17 DIAGNOSIS — Z880 Allergy status to penicillin: Secondary | ICD-10-CM

## 2014-04-17 DIAGNOSIS — K449 Diaphragmatic hernia without obstruction or gangrene: Secondary | ICD-10-CM | POA: Diagnosis present

## 2014-04-17 DIAGNOSIS — E119 Type 2 diabetes mellitus without complications: Secondary | ICD-10-CM | POA: Diagnosis present

## 2014-04-17 DIAGNOSIS — K5730 Diverticulosis of large intestine without perforation or abscess without bleeding: Secondary | ICD-10-CM | POA: Diagnosis present

## 2014-04-17 DIAGNOSIS — K920 Hematemesis: Secondary | ICD-10-CM | POA: Diagnosis present

## 2014-04-17 DIAGNOSIS — Z9114 Patient's other noncompliance with medication regimen: Secondary | ICD-10-CM | POA: Diagnosis present

## 2014-04-17 DIAGNOSIS — F17210 Nicotine dependence, cigarettes, uncomplicated: Secondary | ICD-10-CM | POA: Diagnosis present

## 2014-04-17 DIAGNOSIS — I493 Ventricular premature depolarization: Secondary | ICD-10-CM | POA: Diagnosis present

## 2014-04-17 LAB — URINALYSIS, ROUTINE W REFLEX MICROSCOPIC
Bilirubin Urine: NEGATIVE
Glucose, UA: NEGATIVE mg/dL
HGB URINE DIPSTICK: NEGATIVE
Ketones, ur: 15 mg/dL — AB
Leukocytes, UA: NEGATIVE
Nitrite: NEGATIVE
Protein, ur: NEGATIVE mg/dL
Specific Gravity, Urine: 1.024 (ref 1.005–1.030)
Urobilinogen, UA: 4 mg/dL — ABNORMAL HIGH (ref 0.0–1.0)
pH: 7.5 (ref 5.0–8.0)

## 2014-04-17 LAB — CBC
HCT: 35.6 % — ABNORMAL LOW (ref 39.0–52.0)
Hemoglobin: 11.5 g/dL — ABNORMAL LOW (ref 13.0–17.0)
MCH: 26.2 pg (ref 26.0–34.0)
MCHC: 32.3 g/dL (ref 30.0–36.0)
MCV: 81.1 fL (ref 78.0–100.0)
PLATELETS: 373 10*3/uL (ref 150–400)
RBC: 4.39 MIL/uL (ref 4.22–5.81)
RDW: 14.9 % (ref 11.5–15.5)
WBC: 9.6 10*3/uL (ref 4.0–10.5)

## 2014-04-17 LAB — COMPREHENSIVE METABOLIC PANEL
ALT: 10 U/L (ref 0–53)
ANION GAP: 11 (ref 5–15)
AST: 19 U/L (ref 0–37)
Albumin: 3.4 g/dL — ABNORMAL LOW (ref 3.5–5.2)
Alkaline Phosphatase: 109 U/L (ref 39–117)
BILIRUBIN TOTAL: 0.5 mg/dL (ref 0.3–1.2)
BUN: 26 mg/dL — ABNORMAL HIGH (ref 6–23)
CO2: 25 mmol/L (ref 19–32)
Calcium: 8.9 mg/dL (ref 8.4–10.5)
Chloride: 103 mmol/L (ref 96–112)
Creatinine, Ser: 1.08 mg/dL (ref 0.50–1.35)
GFR calc Af Amer: 72 mL/min — ABNORMAL LOW (ref >90)
GFR, EST NON AFRICAN AMERICAN: 62 mL/min — AB (ref >90)
Glucose, Bld: 126 mg/dL — ABNORMAL HIGH (ref 70–99)
Potassium: 3.8 mmol/L (ref 3.5–5.1)
Sodium: 139 mmol/L (ref 135–145)
Total Protein: 6.8 g/dL (ref 6.0–8.3)

## 2014-04-17 LAB — I-STAT TROPONIN, ED: Troponin i, poc: 0 ng/mL (ref 0.00–0.08)

## 2014-04-17 LAB — PROTIME-INR
INR: 1.15 (ref 0.00–1.49)
Prothrombin Time: 14.8 seconds (ref 11.6–15.2)

## 2014-04-17 LAB — TROPONIN I

## 2014-04-17 LAB — POCT GASTRIC OCCULT BLOOD (1-CARD TO LAB): Occult Blood, Gastric: POSITIVE — AB

## 2014-04-17 LAB — LIPASE, BLOOD: Lipase: 36 U/L (ref 11–59)

## 2014-04-17 MED ORDER — IOHEXOL 300 MG/ML  SOLN
100.0000 mL | Freq: Once | INTRAMUSCULAR | Status: AC | PRN
  Administered 2014-04-17: 100 mL via INTRAVENOUS

## 2014-04-17 MED ORDER — SODIUM CHLORIDE 0.9 % IV SOLN
1000.0000 mL | Freq: Once | INTRAVENOUS | Status: AC
  Administered 2014-04-17: 1000 mL via INTRAVENOUS

## 2014-04-17 MED ORDER — SODIUM CHLORIDE 0.9 % IJ SOLN
3.0000 mL | Freq: Two times a day (BID) | INTRAMUSCULAR | Status: DC
  Administered 2014-04-17 – 2014-04-22 (×8): 3 mL via INTRAVENOUS

## 2014-04-17 MED ORDER — SODIUM CHLORIDE 0.9 % IV SOLN
80.0000 mg | Freq: Two times a day (BID) | INTRAVENOUS | Status: DC
  Administered 2014-04-17 – 2014-04-18 (×2): 80 mg via INTRAVENOUS
  Filled 2014-04-17 (×5): qty 80

## 2014-04-17 MED ORDER — ADULT MULTIVITAMIN W/MINERALS CH
1.0000 | ORAL_TABLET | Freq: Every day | ORAL | Status: DC
  Administered 2014-04-19 – 2014-04-22 (×4): 1 via ORAL
  Filled 2014-04-17 (×5): qty 1

## 2014-04-17 MED ORDER — DEXTROSE-NACL 5-0.45 % IV SOLN
INTRAVENOUS | Status: DC
  Administered 2014-04-17: 17:00:00 via INTRAVENOUS

## 2014-04-17 MED ORDER — ONDANSETRON HCL 4 MG/2ML IJ SOLN: 4.0000 mg | Freq: Four times a day (QID) | INTRAMUSCULAR | Status: DC | PRN

## 2014-04-17 MED ORDER — AMLODIPINE BESYLATE 5 MG PO TABS
5.0000 mg | ORAL_TABLET | Freq: Every day | ORAL | Status: DC
  Administered 2014-04-17 – 2014-04-22 (×6): 5 mg via ORAL
  Filled 2014-04-17 (×6): qty 1

## 2014-04-17 MED ORDER — ONDANSETRON HCL 4 MG PO TABS: 4.0000 mg | ORAL_TABLET | Freq: Four times a day (QID) | ORAL | Status: DC | PRN

## 2014-04-17 MED ORDER — LORAZEPAM 1 MG PO TABS
1.0000 mg | ORAL_TABLET | Freq: Four times a day (QID) | ORAL | Status: AC | PRN
  Filled 2014-04-17: qty 1

## 2014-04-17 MED ORDER — M.V.I. ADULT IV INJ
INJECTION | Freq: Once | INTRAVENOUS | Status: AC
  Administered 2014-04-17: 15:00:00 via INTRAVENOUS
  Filled 2014-04-17: qty 1000

## 2014-04-17 MED ORDER — FOLIC ACID 1 MG PO TABS
1.0000 mg | ORAL_TABLET | Freq: Every day | ORAL | Status: DC
  Administered 2014-04-19 – 2014-04-22 (×4): 1 mg via ORAL
  Filled 2014-04-17 (×5): qty 1

## 2014-04-17 MED ORDER — IOHEXOL 300 MG/ML  SOLN
25.0000 mL | Freq: Once | INTRAMUSCULAR | Status: AC | PRN
  Administered 2014-04-17: 25 mL via ORAL

## 2014-04-17 MED ORDER — LORAZEPAM 2 MG/ML IJ SOLN
1.0000 mg | Freq: Four times a day (QID) | INTRAMUSCULAR | Status: AC | PRN
  Administered 2014-04-17: 1 mg via INTRAVENOUS
  Filled 2014-04-17: qty 1

## 2014-04-17 MED ORDER — ADULT MULTIVITAMIN W/MINERALS CH: 1.0000 | ORAL_TABLET | Freq: Every day | ORAL | Status: DC

## 2014-04-17 MED ORDER — ONDANSETRON HCL 4 MG/2ML IJ SOLN: 4.0000 mg | Freq: Once | INTRAMUSCULAR | Status: AC

## 2014-04-17 MED ORDER — SODIUM CHLORIDE 0.9 % IV SOLN
80.0000 mg | Freq: Once | INTRAVENOUS | Status: AC
  Administered 2014-04-17: 80 mg via INTRAVENOUS
  Filled 2014-04-17: qty 80

## 2014-04-17 MED ORDER — ONDANSETRON HCL 4 MG/2ML IJ SOLN
4.0000 mg | Freq: Once | INTRAMUSCULAR | Status: AC
  Administered 2014-04-17: 4 mg via INTRAVENOUS
  Filled 2014-04-17: qty 2

## 2014-04-17 MED ORDER — VITAMIN B-1 100 MG PO TABS
100.0000 mg | ORAL_TABLET | Freq: Every day | ORAL | Status: DC
  Administered 2014-04-19 – 2014-04-22 (×4): 100 mg via ORAL
  Filled 2014-04-17 (×5): qty 1

## 2014-04-17 NOTE — H&P (Signed)
[facility] Admission History and Physical Service Pager: [phone]  Patient name: Lynk Marti Medical record number: [id number] Date of birth: 01/08/1930 Age: 81 y.o. Gender: male  Primary Care Provider: No PCP Per Patient Consultants: GI Code Status: full  Chief Complaint: nausea/vomiting  Assessment and Plan: Parmvir Boomer is a 81 y.o. male presenting with n/v and abdominal pain x2 days . PMH is significant for afib, COPD, GIB, CHF, anemia, homelessness and alcohol abuse.  # Nausea/voming/GIB: Hgb at baseline, denies melena or BRBPR, coffee ground emesis in ED. FOBT positive from above and below. CT with esophageal edema/thickening. Normal lipase, LFTs - IV PPI bid - GI consult - NPO for possible endoscopy - Monitor CBC  # Afib/T-wave inversions on EKG: NSR at this point - repeat EKG in am - cycle trops  # COPD: No sign of exacerbation at this point, no meds - monitor respiratory status  # CHF: No meds at home, no sign of volume overload - Monitor volume status  # H/o alcohol abuse - CIWA - thiamine and folate  # HTN: amlodipine rx'ed, not taking any meds - restart home amlodipine  FEN/GI: NPO, MIVF Prophylaxis: SCDs  Disposition: admit to [place], Dr. Ree Kida attending  History of Present Illness: Fabion Gatson is a 81 y.o. male presenting with nausea and vomiting starting yesterday. Patient reports yellow vomitus without blood. No fever, no melena or BRBPR. Endorses diffuse mild abdominal pain also since yesterday. No diarrhea. Has had similar episodes in the past.   Review Of Systems: Per HPI  Otherwise 12 point review of systems was performed and was unremarkable.  Patient Active Problem List   Diagnosis Date Noted  . Protein-calorie malnutrition, severe 02/19/2014  . Atrial flutter with rapid ventricular response 02/16/2014  . Generalized abdominal pain   . Acute alcoholic pancreatitis 97/41/6384  . Diabetes mellitus without  complication 53/64/6803  . CAP (community acquired pneumonia) 01/09/2014  . Atrial fibrillation with rapid ventricular response 07/04/2013  . A-fib 07/04/2013  . COPD exacerbation 06/15/2013  . Community acquired pneumonia 06/15/2013  . Atrial fibrillation with RVR 03/03/2013  . CHF exacerbation 03/02/2013  . Normocytic anemia 03/01/2013  . Long QT interval 03/01/2013  . Influenza due to identified novel influenza A virus with other respiratory manifestations 03/01/2013  . GI bleed 01/06/2013  . Reflux esophagitis 01/06/2013  . Esophageal mass 12/03/2012  . Rib fractures 11/22/2012  . Elbow fracture, right 11/22/2012  . Benign neoplasm of colon 01/30/2012  . Heme positive stool 01/29/2012  . Dyspnea on exertion 01/29/2012  . Iron deficiency anemia, unspecified 01/29/2012  . Homelessness 05/04/2011  . CHF (congestive heart failure) 03/28/2011  . COPD (chronic obstructive pulmonary disease) 03/28/2011  . Tobacco abuse 03/28/2011  . Shortness of breath 03/28/2011  . Dehydration 02/27/2011  . Syncope 02/27/2011  . Pneumonia 02/02/2011  . Upper GI bleeding 02/02/2011  . Alcoholism 02/02/2011  . Leukocytosis 02/02/2011  . Anemia associated with acute blood loss 02/02/2011   Past Medical History: Past Medical History  Diagnosis Date  . COPD (chronic obstructive pulmonary disease)   . Chronic back pain   . Pneumonia     01/2011 - CAP vs aspiration pneuonmia  . Depression   . PONV (postoperative nausea and vomiting)   . Hyponatremia     Previously felt secondary to SIADH  . Hypertension   . Upper GI bleed     01/2011 with EGD showing severe candida esophagitis and duodenal bulb erosion  . Anemia  In the setting of UGI bleed 01/2011 requiring blood transfusion  . Homelessness   . Dyspnea     Chronic, thought due to COPD. Echo 02/2010 with EF 30-35%, nuclear study negative, then repeat echo 03/2011 did not show systolic dysfxn, so unclear if truly HF  . Bronchitis   .  Diabetes mellitus without complication   . Collar bone fracture     left   Past Surgical History: Past Surgical History  Procedure Laterality Date  . Tonsillectomy    . Vasectomy    . Appendectomy    . Tonsillectomy    . Colonoscopy    . Esophagogastroduodenoscopy    . Esophagogastroduodenoscopy  02/03/2011    Procedure: ESOPHAGOGASTRODUODENOSCOPY (EGD);  Surgeon: Juanita Craver, MD;  Location: [facility];  Service: Endoscopy;  Laterality: N/A;  . Colonoscopy  01/30/2012    Procedure: COLONOSCOPY;  Surgeon: Ladene Artist, MD,FACG;  Location: [facility];  Service: Endoscopy;  Laterality: N/A;   Social History: History  Substance Use Topics  . Smoking status: Current Some Day Smoker -- 1.00 packs/day for 60 years    Types: Cigarettes, Cigars  . Smokeless tobacco: Never Used     Comment: Since age 41  . Alcohol Use: Yes     Comment: 174 ml of scotch weekly ---- "I only drink by the drink and speicial occasion/celebrate." 04/17/14   Please also refer to relevant sections of EMR.  Family History: Family History  Problem Relation Age of Onset  . Coronary artery disease Father     Starting in his 33's. Died of massive MI at age 40.  . Schizophrenia Brother   . Coronary artery disease Sister     MI at age 14  . Alzheimer's disease Mother    Allergies and Medications: Allergies  Allergen Reactions  . Benadryl [Diphenhydramine Hcl] Other (See Comments)    Upset stomach   . Penicillins Hives and Itching   No current facility-administered medications on file prior to encounter.   Current Outpatient Prescriptions on File Prior to Encounter  Medication Sig Dispense Refill  . amLODipine (NORVASC) 5 MG tablet Take 1 tablet (5 mg total) by mouth daily. (Patient not taking: Reported on 02/27/2014) 30 tablet 0  . folic acid (FOLVITE) 1 MG tablet Take 1 tablet (1 mg total) by mouth daily. (Patient not taking: Reported on 02/27/2014) 30 tablet 0  . guaiFENesin (MUCINEX) 600 MG 12 hr  tablet Take 1 tablet (600 mg total) by mouth 2 (two) times daily as needed for to loosen phlegm. (Patient not taking: Reported on 02/27/2014) 60 tablet 0  . HYDROcodone-acetaminophen (NORCO/VICODIN) 5-325 MG per tablet Take 1-2 tablets by mouth every 4 (four) hours as needed for moderate pain. (Patient not taking: Reported on 02/27/2014) 30 tablet 0  . ondansetron (ZOFRAN) 4 MG tablet Take 1 tablet (4 mg total) by mouth every 6 (six) hours as needed for nausea or vomiting. (Patient not taking: Reported on 03/10/2014) 12 tablet 0  . predniSONE (STERAPRED UNI-PAK) 10 MG tablet Take by mouth daily. Day 1: take 6 tabs.  Day 2: 5 tabs  Day 3: 4 tabs  Day 4: 3 tabs  Day 5: 2 tabs  Day 6: 1 tab (Patient not taking: Reported on 03/01/2014) 21 tablet 0  . thiamine 100 MG tablet Take 1 tablet (100 mg total) by mouth daily. (Patient not taking: Reported on 02/27/2014) 30 tablet 0    Objective: BP 171/71 mmHg  Pulse 84  Temp(Src) 98.2 F (36.8 C) (Oral)  Resp 18  Ht 5\' 7"  (1.702 m)  Wt 126 lb 4.8 oz (57.289 kg)  BMI 19.78 kg/m2  SpO2 100% Exam: General: unkempt elderly man, lying in bed, NAD HEENT: MMM, NCAT, PERRL, EOMI Cardiovascular: RRR, no MRG, 2+dp pulses Respiratory: CTAB, normal WOB Abdomen: soft, ND, diffuse mild tenderness, +BS Extremities: WWP, no LE edema Skin: intact, no rashes Neuro: alert and oriented if somewhat tangential, no focal deficits  Labs and Imaging: CBC BMET   Recent Labs Lab 04/17/14 0915  WBC 9.6  HGB 11.5*  HCT 35.6*  PLT 373    Recent Labs Lab 04/17/14 0915  NA 139  K 3.8  CL 103  CO2 25  BUN 26*  CREATININE 1.08  GLUCOSE 126*  CALCIUM 8.9    CT abdomen: 1. Large amount of stool within the rectum, suggesting fecal impaction. No other explanation for chronic abdominal pain. 2. Small hiatal hernia with distal esophageal wall thickening and new or increased edema surrounding the distal esophagus. Contiguous edema versus fluid seen adjacent to a markedly  atherosclerotic irregular lower thoracic aorta. Favor the process secondary to esophagitis. Consider correlation with endoscopy to exclude ulcer or malignancy. Concurrent vascular pathology secondary to vasculitis or even small volume periaortic hemorrhage is felt less likely. 3. Advanced atherosclerosis with aortic and left common iliac artery ectasia. 4. Cholelithiasis. 5. Centrilobular emphysema with basilar mild pulmonary fibrosis.  CXR: No interval change or acute cardiopulmonary disease. Chronic nonspecific diffuse interstitial pattern.  Frazier Richards, MD 04/17/2014, 4:31 PM PGY-2, [place] [facility] pager: [phone], text pages welcome

## 2014-04-17 NOTE — ED Provider Notes (Signed)
CSN: [id number]     Arrival date & time 04/17/14  3832 History   First MD Initiated Contact with Patient 04/17/14 0915     Chief Complaint  Patient presents with  . Abdominal Pain  . Cough     (Consider location/radiation/quality/duration/timing/severity/associated sxs/prior Treatment) HPI 81 year old man history of COPD, diabetes, and homeless presents today complaining of nausea. He also has had some cough productive of clear sputum. He states this is his baseline. He denies that his abdomen hurts and states that he has just been regurgitating. Patient denies any recent alcohol intake. He does have a history of upper GI bleeding in the past. He denies hematemesis or coffee-ground emesis here. He denies any change in bowel habits. Patient denies chest pain, head injury, lateralized weakness. Past Medical History  Diagnosis Date  . COPD (chronic obstructive pulmonary disease)   . Chronic back pain   . Pneumonia     01/2011 - CAP vs aspiration pneuonmia  . Depression   . PONV (postoperative nausea and vomiting)   . Hyponatremia     Previously felt secondary to SIADH  . Hypertension   . Upper GI bleed     01/2011 with EGD showing severe candida esophagitis and duodenal bulb erosion  . Anemia     In the setting of UGI bleed 01/2011 requiring blood transfusion  . Homelessness   . Dyspnea     Chronic, thought due to COPD. Echo 02/2010 with EF 30-35%, nuclear study negative, then repeat echo 03/2011 did not show systolic dysfxn, so unclear if truly HF  . Bronchitis   . Diabetes mellitus without complication   . Collar bone fracture     left   Past Surgical History  Procedure Laterality Date  . Tonsillectomy    . Vasectomy    . Appendectomy    . Tonsillectomy    . Colonoscopy    . Esophagogastroduodenoscopy    . Esophagogastroduodenoscopy  02/03/2011    Procedure: ESOPHAGOGASTRODUODENOSCOPY (EGD);  Surgeon: Juanita Craver, MD;  Location: [facility];  Service: Endoscopy;  Laterality:  N/A;  . Colonoscopy  01/30/2012    Procedure: COLONOSCOPY;  Surgeon: Ladene Artist, MD,FACG;  Location: [facility];  Service: Endoscopy;  Laterality: N/A;   Family History  Problem Relation Age of Onset  . Coronary artery disease Father     Starting in his 41's. Died of massive MI at age 41.  . Schizophrenia Brother   . Coronary artery disease Sister     MI at age 20  . Alzheimer's disease Mother    History  Substance Use Topics  . Smoking status: Current Some Day Smoker -- 1.00 packs/day for 60 years    Types: Cigarettes, Cigars  . Smokeless tobacco: Never Used     Comment: Since age 38  . Alcohol Use: Yes     Comment: 174 ml of scotch weekly ---- "I only drink by the drink and speicial occasion/celebrate." 04/17/14    Review of Systems  All other systems reviewed and are negative.     Allergies  Benadryl and Penicillins  Home Medications   Prior to Admission medications   Medication Sig Start Date End Date Taking? Authorizing Provider  amLODipine (NORVASC) 5 MG tablet Take 1 tablet (5 mg total) by mouth daily. Patient not taking: Reported on 02/27/2014 01/13/14   Debbe Odea, MD  folic acid (FOLVITE) 1 MG tablet Take 1 tablet (1 mg total) by mouth daily. Patient not taking: Reported on 02/27/2014 01/13/14  Debbe Odea, MD  guaiFENesin (MUCINEX) 600 MG 12 hr tablet Take 1 tablet (600 mg total) by mouth 2 (two) times daily as needed for to loosen phlegm. Patient not taking: Reported on 02/27/2014 01/13/14   Debbe Odea, MD  HYDROcodone-acetaminophen (NORCO/VICODIN) 5-325 MG per tablet Take 1-2 tablets by mouth every 4 (four) hours as needed for moderate pain. Patient not taking: Reported on 02/27/2014 02/21/14   Theodis Blaze, MD  nicotine (NICODERM CQ - DOSED IN MG/24 HR) 7 mg/24hr patch Place 1 patch (7 mg total) onto the skin daily. Patient not taking: Reported on 02/27/2014 01/13/14   Debbe Odea, MD  ondansetron (ZOFRAN) 4 MG tablet Take 1 tablet (4 mg total) by mouth  every 6 (six) hours as needed for nausea or vomiting. Patient not taking: Reported on 03/10/2014 3/54/65   Delora Fuel, MD  predniSONE (STERAPRED UNI-PAK) 10 MG tablet Take by mouth daily. Day 1: take 6 tabs.  Day 2: 5 tabs  Day 3: 4 tabs  Day 4: 3 tabs  Day 5: 2 tabs  Day 6: 1 tab Patient not taking: Reported on 03/01/2014 02/27/14   Clayton Bibles, PA-C  thiamine 100 MG tablet Take 1 tablet (100 mg total) by mouth daily. Patient not taking: Reported on 02/27/2014 01/13/14   Debbe Odea, MD   BP 161/71 mmHg  Pulse 83  Temp(Src) 98.7 F (37.1 C) (Oral)  Resp 21  SpO2 99% Physical Exam  Constitutional: He is oriented to person, place, and time. He appears well-developed and well-nourished.  Unkempt  HENT:  Head: Normocephalic and atraumatic.  Right Ear: External ear normal.  Left Ear: External ear normal.  Nose: Nose normal.  Mouth/Throat: Oropharynx is clear and moist.  Eyes: Conjunctivae and EOM are normal. Pupils are equal, round, and reactive to light.  Neck: Normal range of motion. Neck supple.  Cardiovascular: Normal rate, regular rhythm, normal heart sounds and intact distal pulses.   Pulmonary/Chest: Effort normal and breath sounds normal.  Abdominal: Soft. Bowel sounds are normal.  Musculoskeletal: Normal range of motion.  Neurological: He is alert and oriented to person, place, and time. He displays normal reflexes. No cranial nerve deficit. He exhibits normal muscle tone. Coordination normal.  Skin: Skin is warm and dry.  Psychiatric: His speech is normal. Judgment and thought content normal. His affect is labile and inappropriate. He is agitated. Cognition and memory are normal.  Nursing note and vitals reviewed.   ED Course  Procedures (including critical care time) Labs Review Labs Reviewed  CBC - Abnormal; Notable for the following:    Hemoglobin 11.5 (*)    HCT 35.6 (*)    All other components within normal limits  COMPREHENSIVE METABOLIC PANEL - Abnormal; Notable for  the following:    Glucose, Bld 126 (*)    BUN 26 (*)    Albumin 3.4 (*)    GFR calc non Af Amer 62 (*)    GFR calc Af Amer 72 (*)    All other components within normal limits  URINALYSIS, ROUTINE W REFLEX MICROSCOPIC - Abnormal; Notable for the following:    Ketones, ur 15 (*)    Urobilinogen, UA 4.0 (*)    All other components within normal limits  LIPASE, BLOOD  PROTIME-INR  OCCULT BLOOD X 1 CARD TO LAB, STOOL  I-STAT TROPOININ, ED  POCT GASTRIC OCCULT BLOOD (1-CARD TO LAB)    Imaging Review Dg Chest 2 View  04/17/2014   CLINICAL DATA:  Chest pain. Cough and  emesis for 2 days. Abdominal pain.  EXAM: CHEST  2 VIEW  COMPARISON:  03/21/2014.  FINDINGS: Cardiopericardial silhouette upper limits of normal. Chronic prominence of the pulmonary arteries, probably due to pulmonary arterial hypertension. Diffuse interstitial pattern remains present which is a chronic finding in this patient. Aortic arch atherosclerosis. No airspace consolidation. No effusion.  IMPRESSION: No interval change or acute cardiopulmonary disease. Chronic nonspecific diffuse interstitial pattern.   Electronically Signed   By: Dereck Ligas M.D.   On: 04/17/2014 09:54   Ct Abdomen Pelvis W Contrast  04/17/2014   CLINICAL DATA:  Generalized abdominal pain for 2 years. Cough. History of upper GI bleed. Alcoholism. COPD. Tobacco abuse. CHF.  EXAM: CT ABDOMEN AND PELVIS WITH CONTRAST  TECHNIQUE: Multidetector CT imaging of the abdomen and pelvis was performed using the standard protocol following bolus administration of intravenous contrast.  CONTRAST:  156mL OMNIPAQUE IOHEXOL 300 MG/ML  SOLN  COMPARISON:  03/01/2014  FINDINGS: Lower chest: Centrilobular emphysema. Subpleural reticulation which is again suspicious for mild pulmonary fibrosis at the lung bases. Mild cardiomegaly, without pericardial or pleural effusion.  A small hiatal hernia. Thickening of the distal esophagus with surrounding mediastinal soft tissue thickening.  Similar to slightly increased on image 1 today compared to prior. Soft tissue thickening or fluid is also identified adjacent to the descending thoracic aorta. This is new or increased since the prior exam. There is extensive ulcerative plaque within this portion of aorta.  Hepatobiliary: Normal liver. Multiple gallstones without biliary duct dilatation or evidence acute cholecystitis.  Pancreas: Pancreatic atrophy which is mild. No dominant mass or evidence of pancreatitis.  Spleen: Normal  Adrenals/Urinary Tract: Normal adrenal glands. Mild renal cortical thinning bilaterally. Interpolar left renal cyst of 9 mm. Bilateral too small to characterize renal lesions. No hydronephrosis. Normal urinary bladder.  Stomach/Bowel: Normal distal stomach. Large amount of stool within the rectum. Extensive colonic diverticulosis. Normal terminal ileum. Normal appendix on coronal image 55. Normal small bowel.  Vascular/Lymphatic: Extensive atherosclerotic irregularity within the aorta and branch vessels. Ectasia of the suprarenal aorta is similar. Infrarenal aortic ectasia is also similar at 3.6 cm.  Similar left common iliac ectasia at 1.5 cm. No abdominopelvic adenopathy.  Reproductive: Moderate prostatomegaly.  Other: Small left fat containing inguinal hernia. No significant free fluid.  Musculoskeletal: Bilateral lower remote rib fractures. A moderate L2 compression deformity is similar with minimal ventral canal encroachment  IMPRESSION: 1. Large amount of stool within the rectum, suggesting fecal impaction. No other explanation for chronic abdominal pain. 2. Small hiatal hernia with distal esophageal wall thickening and new or increased edema surrounding the distal esophagus. Contiguous edema versus fluid seen adjacent to a markedly atherosclerotic irregular lower thoracic aorta. Favor the process secondary to esophagitis. Consider correlation with endoscopy to exclude ulcer or malignancy. Concurrent vascular pathology  secondary to vasculitis or even small volume periaortic hemorrhage is felt less likely. 3. Advanced atherosclerosis with aortic and left common iliac artery ectasia. 4. Cholelithiasis. 5. Centrilobular emphysema with basilar mild pulmonary fibrosis.   Electronically Signed   By: Abigail Miyamoto M.D.   On: 04/17/2014 12:09     EKG Interpretation   Date/Time:  Sunday April 17 2014 09:03:40 EST Ventricular Rate:  86 PR Interval:  183 QRS Duration: 97 QT Interval:  415 QTC Calculation: 496 R Axis:   35 Text Interpretation:  Sinus rhythm Multiform ventricular premature  complexes Borderline T abnormalities, lateral leads Borderline prolonged  QT interval pvc are new from first prior ekg of  31 Mar 2014 Confirmed by  Adin Laker MD, Andee Poles [phone]) on 04/17/2014 10:51:01 AM      MDM   Final diagnoses:  Esophagitis  Gastrointestinal hemorrhage, unspecified gastritis, unspecified gastrointestinal hemorrhage type    Patient with coffee-ground emesis here. He is being hemocculted and remainder of work up is pending.   1- gi bleeding- patient with positive coffee ground emessis and stool. Known history of upper gi bleed.   Hemoglobin stable  (increased from last) Patient with some history of alcohol use, but does not currently have any evidence of etoh intoxication- could contribute to bleeding.    2- abnormal ekg- troponin negative   Discussed with Dr. Sherril Cong and plan admission to telemetry bed under Dr. Ree Kida as attending.   Shaune Pollack, MD 04/17/14 [phone]

## 2014-04-17 NOTE — ED Notes (Signed)
Pt from the [facility] via [facility] with c/o generalized abdominal pain and "regurgitation" since this am.  Pt reports abdominal pain x 2 years.  Pt denies nausea.  When asked if he is in pain the response is "I'm wheezy.  Do you know what that means?"  Pt additionally has a productive cough with clear thick mucus.  Pt delays cooperating with staff telling us that we "should be doing it all" for him. Pt in NAD, A&O.

## 2014-04-18 DIAGNOSIS — J42 Unspecified chronic bronchitis: Secondary | ICD-10-CM

## 2014-04-18 DIAGNOSIS — F1010 Alcohol abuse, uncomplicated: Secondary | ICD-10-CM

## 2014-04-18 DIAGNOSIS — R110 Nausea: Secondary | ICD-10-CM

## 2014-04-18 DIAGNOSIS — K922 Gastrointestinal hemorrhage, unspecified: Secondary | ICD-10-CM | POA: Insufficient documentation

## 2014-04-18 DIAGNOSIS — K209 Esophagitis, unspecified without bleeding: Secondary | ICD-10-CM | POA: Insufficient documentation

## 2014-04-18 DIAGNOSIS — Z720 Tobacco use: Secondary | ICD-10-CM

## 2014-04-18 DIAGNOSIS — J449 Chronic obstructive pulmonary disease, unspecified: Secondary | ICD-10-CM | POA: Insufficient documentation

## 2014-04-18 DIAGNOSIS — Z86010 Personal history of colonic polyps: Secondary | ICD-10-CM

## 2014-04-18 DIAGNOSIS — I10 Essential (primary) hypertension: Secondary | ICD-10-CM

## 2014-04-18 LAB — BASIC METABOLIC PANEL
ANION GAP: 6 (ref 5–15)
BUN: 12 mg/dL (ref 6–23)
CALCIUM: 8.1 mg/dL — AB (ref 8.4–10.5)
CO2: 25 mmol/L (ref 19–32)
Chloride: 106 mmol/L (ref 96–112)
Creatinine, Ser: 1.04 mg/dL (ref 0.50–1.35)
GFR calc Af Amer: 75 mL/min — ABNORMAL LOW (ref >90)
GFR calc non Af Amer: 65 mL/min — ABNORMAL LOW (ref >90)
GLUCOSE: 103 mg/dL — AB (ref 70–99)
Potassium: 3.4 mmol/L — ABNORMAL LOW (ref 3.5–5.1)
Sodium: 137 mmol/L (ref 135–145)

## 2014-04-18 LAB — CBC
HCT: 32.4 % — ABNORMAL LOW (ref 39.0–52.0)
HEMOGLOBIN: 10.2 g/dL — AB (ref 13.0–17.0)
MCH: 26.2 pg (ref 26.0–34.0)
MCHC: 31.5 g/dL (ref 30.0–36.0)
MCV: 83.1 fL (ref 78.0–100.0)
PLATELETS: 319 10*3/uL (ref 150–400)
RBC: 3.9 MIL/uL — ABNORMAL LOW (ref 4.22–5.81)
RDW: 15.3 % (ref 11.5–15.5)
WBC: 11.7 10*3/uL — AB (ref 4.0–10.5)

## 2014-04-18 LAB — OCCULT BLOOD, POC DEVICE: FECAL OCCULT BLD: POSITIVE — AB

## 2014-04-18 MED ORDER — PANTOPRAZOLE SODIUM 40 MG PO TBEC
40.0000 mg | DELAYED_RELEASE_TABLET | Freq: Every day | ORAL | Status: DC
  Administered 2014-04-18: 40 mg via ORAL
  Filled 2014-04-18: qty 1

## 2014-04-18 MED ORDER — DEXTROSE IN LACTATED RINGERS 5 % IV SOLN
INTRAVENOUS | Status: DC
  Administered 2014-04-18: 10:00:00 via INTRAVENOUS

## 2014-04-18 MED ORDER — PANTOPRAZOLE SODIUM 40 MG PO TBEC: 40.0000 mg | DELAYED_RELEASE_TABLET | Freq: Two times a day (BID) | ORAL | Status: DC

## 2014-04-18 NOTE — Progress Notes (Signed)
Family Medicine Teaching Service Daily Progress Note Intern Pager: [phone]  Patient name: Dale Mcfarland Medical record number: [id number] Date of birth: 1929-11-12 Age: 81 y.o. Gender: male  Primary Care Provider: No PCP Per Patient Consultants: GI Code Status: Full  Pt Overview and Major Events to Date:  3/6: Patient admitted for n/v w/ coffee ground emesis  Assessment and Plan: Dale Mcfarland is a 81 y.o. male presenting with n/v and abdominal pain x2 days . PMH is significant for afib, COPD, GIB, CHF, anemia, homelessness and alcohol abuse.  # Nausea/voming/Melena/GIB: Hgb at baseline, denies BRBPR. Coffee ground emesis in ED. FOBT positive from above and below. CT with esophageal edema/thickening. Normal lipase, LFTs - IV PPI bid - GI consulted >> has not seemed patient yet  - NPO for possible endoscopy - Melena and Urobilinogen 4.0 >> suspect Upper GI - Monitor CBC - Hgb 11.5 >> 10.2 (3/7)  # Afib/T-wave inversions on EKG: NSR at this point - repeat EKG in am - cycle trops: neg x1  # COPD: No sign of exacerbation at this point, no meds - monitor respiratory status  # chronic/systolic CHF: Last Echo (6/75/91) w/ EF 50-55%. EF 30-35% in 2013. No meds at home, no sign of volume overload - Monitor volume status  # H/o alcohol abuse - CIWA - thiamine and folate  # HTN: amlodipine rx'ed, not taking any meds - restart home amlodipine  FEN/GI: NPO, MIVF >> changed from D5/half NS to D5/LR due to K+ Prophylaxis: SCDs   Disposition: Pending medical stability  Subjective:  Patient had very little questions at this time. Seemed fairly indifferent about everything we discussed including his current NPO status and the likely necessity to r/o esophageal CA via EGD.   Patient DID endorse Melena x2 weeks today. This was denied on admission but seems to be something that had been ongoing for a while. Otherwise, no issues at this time.  Objective: Temp:  [98.2 F (36.8 C)-99.1 F  (37.3 C)] 98.4 F (36.9 C) (03/07 0446) Pulse Rate:  [25-88] 70 (03/07 0446) Resp:  [17-21] 17 (03/07 0446) BP: (121-171)/(60-82) 136/60 mmHg (03/07 0446) SpO2:  [95 %-100 %] 98 % (03/07 0446) Weight:  [126 lb 4.8 oz (57.289 kg)] 126 lb 4.8 oz (57.289 kg) (03/06 1341) Physical Exam: General: unkempt elderly man, lying in bed, NAD  HEENT: MMM, NCAT Cardiovascular: RRR, no MRG, 2+dp pulses Respiratory: CTAB, normal WOB Abdomen: soft, ND, diffuse mild tenderness, +BS Extremities: WWP, no LE edema Skin: intact, no rashes Neuro: alert and oriented, no focal deficits  Laboratory:  Recent Labs Lab 04/17/14 0915 04/18/14 0728  WBC 9.6 11.7*  HGB 11.5* 10.2*  HCT 35.6* 32.4*  PLT 373 319    Recent Labs Lab 04/17/14 0915 04/18/14 0728  NA 139 137  K 3.8 3.4*  CL 103 106  CO2 25 25  BUN 26* 12  CREATININE 1.08 1.04  CALCIUM 8.9 8.1*  PROT 6.8  --   BILITOT 0.5  --   ALKPHOS 109  --   ALT 10  --   AST 19  --   GLUCOSE 126* 103*   POC Gastric Occult Blood -- Positive Lipase 36 (N)  Imaging/Diagnostic Tests: CT Abd/Pelvis 3/6 IMPRESSION: 1. Large amount of stool within the rectum, suggesting fecal impaction. No other explanation for chronic abdominal pain. 2. Small hiatal hernia with distal esophageal wall thickening and new or increased edema surrounding the distal esophagus. Contiguous edema versus fluid seen adjacent to a markedly atherosclerotic  irregular lower thoracic aorta. Favor the process secondary to esophagitis. Consider correlation with endoscopy to exclude ulcer or malignancy. Concurrent vascular pathology secondary to vasculitis or even small volume periaortic hemorrhage is felt less likely. 3. Advanced atherosclerosis with aortic and left common iliac artery ectasia. 4. Cholelithiasis. 5. Centrilobular emphysema with basilar mild pulmonary fibrosis.   Elberta Leatherwood, MD 04/18/2014, 9:39 AM PGY-1, [place] Intern pager:  [phone], text pages welcome
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
## 2014-04-18 NOTE — Anesthesia Preprocedure Evaluation (Addendum)
Anesthesia Evaluation  Patient identified by MRN, date of birth, ID band Patient awake    Reviewed: Allergy & Precautions, NPO status , Patient's Chart, lab work & pertinent test results  History of Anesthesia Complications (+) PONV  Airway        Dental   Pulmonary COPDCurrent Smoker,          Cardiovascular hypertension, Pt. on medications  ECHO 2015 EF 55%   Neuro/Psych Depression L carotid 79% stenosis, R ok    GI/Hepatic   Endo/Other  diabetes  Renal/GU      Musculoskeletal   Abdominal   Peds  Hematology 10/32   Anesthesia Other Findings   Reproductive/Obstetrics                            Anesthesia Physical Anesthesia Plan  ASA: III  Anesthesia Plan: MAC   Post-op Pain Management:    Induction: Intravenous  Airway Management Planned: Nasal Cannula  Additional Equipment:   Intra-op Plan:   Post-operative Plan:   Informed Consent: I have reviewed the patients History and Physical, chart, labs and discussed the procedure including the risks, benefits and alternatives for the proposed anesthesia with the patient or authorized representative who has indicated his/her understanding and acceptance.     Plan Discussed with:   Anesthesia Plan Comments:         Anesthesia Quick Evaluation

## 2014-04-18 NOTE — Clinical Documentation Improvement (Signed)
Supporting Information: Patient with a history of CHF per 3/06 progress notes. No signs of volume overload, monitor volume status per 3/07 progress notes.    Possible Clinical Condition? . Document acuity --Acute --Chronic --Acute on Chronic . Document type --Diastolic --Systolic --Combined systolic and diastolic . Due to or associated with --Cardiac or other surgery --Hypertension --Valvular disease --Rheumatic heart disease Endocarditis (valvitis) Pericarditis Myocarditis --Other (specify)     Thank Sherian Maroon Documentation Specialist [phone] Brinda [email or web address]

## 2014-04-18 NOTE — Clinical Social Work Psychosocial (Signed)
Clinical Social Work Department BRIEF PSYCHOSOCIAL ASSESSMENT 04/18/2014  Patient:  Dale Mcfarland, Dale Mcfarland     Account Number:  [id number]     Admit date:  04/17/2014  Clinical Social Worker:  Lovey Newcomer  Date/Time:  04/18/2014 01:57 PM  Referred by:  Physician  Date Referred:  04/18/2014 Referred for  Homelessness   Other Referral:   NA   Interview type:  Patient Other interview type:   Patient alert and oriented at time of assessment.    PSYCHOSOCIAL DATA Living Status:  ALONE Admitted from facility:   Level of care:   Primary support name:  Findley Vi Primary support relationship to patient:  CHILD, ADULT Degree of support available:   Support is limited.    CURRENT CONCERNS Current Concerns  Other - See comment   Other Concerns:   Patient referred for homelessness and ETOH use.    SOCIAL WORK ASSESSMENT / PLAN CSW met with patient at bedside to complete assessment. Patient appears unkempt and guarded. CSW explained reason for visit and patient was agreeable to speaking with CSW. Patient states that he has been living on the streets. Patient states that he does not drink and has not had alcohol in over a month. Patient reports that he has very littled family support in the area.    CSW also assessed patient for SNF placement in the event this is recommended for placement. Patient states that he is agreeable to placement if needed for rehab. CSW inquired about patient's living situation and whether the patient would like permanent housing. The patient states that he is interested in this. CSW explained to patient that [employer] has case managers that can help with placement. CSW will also assist with completing a VI-SPDAT if time allows.   Assessment/plan status:  Psychosocial Support/Ongoing Assessment of Needs Other assessment/ plan:   Complete FL2, Fax, PASRR (if SNF placement is indicated), and complete VI-SPDAT.   Information/referral to  community resources:   CSW provided patient with shelter list. Patient declined substance abuse resources as he doesn't feel that he needs these.    PATIENT'S/FAMILY'S RESPONSE TO PLAN OF CARE: Patient states that he will utilize resources provided at discharge. Patient does request bus passes, which can be provided at time of DC. CSW will continue to follow and assist as appropriate.       Liz Beach MSW, [place], [place], [phone]

## 2014-04-18 NOTE — Progress Notes (Signed)
Pt has not voided all day. Bladder scan revealed 130 cc in bladder. Pt states he does not feel the urge to go yet. Family teaching resident informed and aware. RN to promote more fluid intake. Will continue to monitor.

## 2014-04-19 DIAGNOSIS — J449 Chronic obstructive pulmonary disease, unspecified: Secondary | ICD-10-CM

## 2014-04-19 LAB — CBC WITH DIFFERENTIAL/PLATELET
BASOS PCT: 1 % (ref 0–1)
Basophils Absolute: 0.1 10*3/uL (ref 0.0–0.1)
EOS ABS: 0.3 10*3/uL (ref 0.0–0.7)
Eosinophils Relative: 3 % (ref 0–5)
HCT: 30.7 % — ABNORMAL LOW (ref 39.0–52.0)
Hemoglobin: 9.9 g/dL — ABNORMAL LOW (ref 13.0–17.0)
Lymphocytes Relative: 15 % (ref 12–46)
Lymphs Abs: 1.5 10*3/uL (ref 0.7–4.0)
MCH: 27 pg (ref 26.0–34.0)
MCHC: 32.2 g/dL (ref 30.0–36.0)
MCV: 83.7 fL (ref 78.0–100.0)
Monocytes Absolute: 0.9 10*3/uL (ref 0.1–1.0)
Monocytes Relative: 10 % (ref 3–12)
NEUTROS ABS: 6.8 10*3/uL (ref 1.7–7.7)
NEUTROS PCT: 71 % (ref 43–77)
Platelets: 274 10*3/uL (ref 150–400)
RBC: 3.67 MIL/uL — ABNORMAL LOW (ref 4.22–5.81)
RDW: 15.1 % (ref 11.5–15.5)
WBC: 9.5 10*3/uL (ref 4.0–10.5)

## 2014-04-19 LAB — BASIC METABOLIC PANEL
Anion gap: 6 (ref 5–15)
BUN: 8 mg/dL (ref 6–23)
CO2: 25 mmol/L (ref 19–32)
Calcium: 8.3 mg/dL — ABNORMAL LOW (ref 8.4–10.5)
Chloride: 105 mmol/L (ref 96–112)
Creatinine, Ser: 1.09 mg/dL (ref 0.50–1.35)
GFR calc Af Amer: 71 mL/min — ABNORMAL LOW (ref >90)
GFR, EST NON AFRICAN AMERICAN: 61 mL/min — AB (ref >90)
Glucose, Bld: 92 mg/dL (ref 70–99)
POTASSIUM: 3.7 mmol/L (ref 3.5–5.1)
SODIUM: 136 mmol/L (ref 135–145)

## 2014-04-19 MED ORDER — POTASSIUM CHLORIDE CRYS ER 20 MEQ PO TBCR
20.0000 meq | EXTENDED_RELEASE_TABLET | Freq: Once | ORAL | Status: AC
  Administered 2014-04-19: 20 meq via ORAL
  Filled 2014-04-19: qty 1

## 2014-04-19 MED ORDER — POLYETHYLENE GLYCOL 3350 17 G PO PACK
17.0000 g | PACK | Freq: Once | ORAL | Status: AC
  Administered 2014-04-19: 17 g via ORAL
  Filled 2014-04-19: qty 1

## 2014-04-19 MED ORDER — PEG-KCL-NACL-NASULF-NA ASC-C 100 G PO SOLR
1.0000 | Freq: Once | ORAL | Status: AC
  Administered 2014-04-19: 200 g via ORAL
  Filled 2014-04-19: qty 1

## 2014-04-19 MED ORDER — PANTOPRAZOLE SODIUM 40 MG PO TBEC
40.0000 mg | DELAYED_RELEASE_TABLET | Freq: Every day | ORAL | Status: DC
  Administered 2014-04-20: 40 mg via ORAL
  Filled 2014-04-19: qty 1

## 2014-04-19 MED ORDER — BISACODYL 5 MG PO TBEC
10.0000 mg | DELAYED_RELEASE_TABLET | Freq: Once | ORAL | Status: AC
  Administered 2014-04-19: 10 mg via ORAL
  Filled 2014-04-19: qty 2

## 2014-04-19 NOTE — Progress Notes (Signed)
Daily Rounding Note  04/19/2014, 10:26 AM  LOS: 2 days   SUBJECTIVE:       Had BM yesterday, hungry on clears today.  Colonoscopy/egd rescheduled for 3/9 No nausea, vomiting, no abdominal pain .  Breathing easily, no complaints  OBJECTIVE:         Vital signs in last 24 hours:    Temp:  [97.8 F (36.6 C)-99.3 F (37.4 C)] 97.8 F (36.6 C) (03/08 0511) Pulse Rate:  [84-85] 84 (03/07 2119) Resp:  [20-24] 20 (03/08 0511) BP: (110-138)/(51-75) 110/51 mmHg (03/08 0511) SpO2:  [95 %-97 %] 96 % (03/08 0511) Last BM Date: 04/17/14 Filed Weights   04/17/14 1341  Weight: 126 lb 4.8 oz (57.289 kg)   General: looks well.    Heart: RRR Chest: clear bil.  No dyspnea Abdomen: soft, NT, ND.  Active BS  Extremities: no CCE Neuro/Psych:  Pleasant, cooperative, relaxed.  Oriented x 3.   Intake/Output from previous day: 03/07 0701 - 03/08 0700 In: 539.2 [I.V.:439.2; IV Piggyback:100] Out: 500 [Urine:500]  Intake/Output this shift: Total I/O In: 200 [P.O.:200] Out: 100 [Urine:100]  Lab Results:  Recent Labs  04/17/14 0915 04/18/14 0728  WBC 9.6 11.7*  HGB 11.5* 10.2*  HCT 35.6* 32.4*  PLT 373 319   BMET  Recent Labs  04/17/14 0915 04/18/14 0728  NA 139 137  K 3.8 3.4*  CL 103 106  CO2 25 25  GLUCOSE 126* 103*  BUN 26* 12  CREATININE 1.08 1.04  CALCIUM 8.9 8.1*   LFT  Recent Labs  04/17/14 0915  PROT 6.8  ALBUMIN 3.4*  AST 19  ALT 10  ALKPHOS 109  BILITOT 0.5   PT/INR  Recent Labs  04/17/14 0915  LABPROT 14.8  INR 1.15   Hepatitis Panel No results for input(s): HEPBSAG, HCVAB, HEPAIGM, HEPBIGM in the last 72 hours.  Studies/Results: Ct Abdomen Pelvis W Contrast  04/17/2014   CLINICAL DATA:  Generalized abdominal pain for 2 years. Cough. History of upper GI bleed. Alcoholism. COPD. Tobacco abuse. CHF.  EXAM: CT ABDOMEN AND PELVIS WITH CONTRAST  TECHNIQUE: Multidetector CT imaging of the  abdomen and pelvis was performed using the standard protocol following bolus administration of intravenous contrast.  CONTRAST:  137mL OMNIPAQUE IOHEXOL 300 MG/ML  SOLN  COMPARISON:  03/01/2014  FINDINGS: Lower chest: Centrilobular emphysema. Subpleural reticulation which is again suspicious for mild pulmonary fibrosis at the lung bases. Mild cardiomegaly, without pericardial or pleural effusion.  A small hiatal hernia. Thickening of the distal esophagus with surrounding mediastinal soft tissue thickening. Similar to slightly increased on image 1 today compared to prior. Soft tissue thickening or fluid is also identified adjacent to the descending thoracic aorta. This is new or increased since the prior exam. There is extensive ulcerative plaque within this portion of aorta.  Hepatobiliary: Normal liver. Multiple gallstones without biliary duct dilatation or evidence acute cholecystitis.  Pancreas: Pancreatic atrophy which is mild. No dominant mass or evidence of pancreatitis.  Spleen: Normal  Adrenals/Urinary Tract: Normal adrenal glands. Mild renal cortical thinning bilaterally. Interpolar left renal cyst of 9 mm. Bilateral too small to characterize renal lesions. No hydronephrosis. Normal urinary bladder.  Stomach/Bowel: Normal distal stomach. Large amount of stool within the rectum. Extensive colonic diverticulosis. Normal terminal ileum. Normal appendix on coronal image 55. Normal small bowel.  Vascular/Lymphatic: Extensive atherosclerotic irregularity within the aorta and branch vessels. Ectasia of the suprarenal aorta is similar. Infrarenal aortic ectasia  is also similar at 3.6 cm.  Similar left common iliac ectasia at 1.5 cm. No abdominopelvic adenopathy.  Reproductive: Moderate prostatomegaly.  Other: Small left fat containing inguinal hernia. No significant free fluid.  Musculoskeletal: Bilateral lower remote rib fractures. A moderate L2 compression deformity is similar with minimal ventral canal  encroachment  IMPRESSION: 1. Large amount of stool within the rectum, suggesting fecal impaction. No other explanation for chronic abdominal pain. 2. Small hiatal hernia with distal esophageal wall thickening and new or increased edema surrounding the distal esophagus. Contiguous edema versus fluid seen adjacent to a markedly atherosclerotic irregular lower thoracic aorta. Favor the process secondary to esophagitis. Consider correlation with endoscopy to exclude ulcer or malignancy. Concurrent vascular pathology secondary to vasculitis or even small volume periaortic hemorrhage is felt less likely. 3. Advanced atherosclerosis with aortic and left common iliac artery ectasia. 4. Cholelithiasis. 5. Centrilobular emphysema with basilar mild pulmonary fibrosis.   Electronically Signed   By: Abigail Miyamoto M.D.   On: 04/17/2014 12:09    ASSESMENT:   * Hematemesis in a homeless alcohollic CT showing changes in distal esophagus. Rule out esophagitis, r/o neoplasia.   * Fecal impaction.   * Normocytic anemia.   *  Hypokalemia.  Mild  * Uncomplicated gallstones. LFTs normal.   * Atherosclerosis of aorta and branch vessels, left common iliac.   * Emphysema.   * Hx Afib. Not AC candidate due to non-compliance.     PLAN   *  Colon/EGD have been rescheduled for tomorrow at 10 AM.  Split dose movi prep ordered to start tonite. On clears.  *  Also ordered 20 meq of oral potassium, as anesthesia has cancelled endo cases for hypokalemia in past. BMET for AM in place.     Azucena Freed  04/19/2014, 10:26 AM Pager: [phone]

## 2014-04-19 NOTE — Progress Notes (Signed)
Family Medicine Teaching Service Daily Progress Note Intern Pager: [phone]  Patient name: Dale Mcfarland Medical record number: [id number] Date of birth: 18-May-1929 Age: 81 y.o. Gender: male  Primary Care Provider: No PCP Per Patient Consultants: GI Code Status: Full  Pt Overview and Major Events to Date:  3/6: Patient admitted for n/v w/ coffee ground emesis 3/7-3/8: Hgb trending down. GI consulted and plan for Endoscopies U/L.   Assessment and Plan: Dale Mcfarland is a 81 y.o. male presenting with n/v and abdominal pain x2 days . PMH is significant for afib, COPD, GIB, CHF, anemia, homelessness and alcohol abuse.  # Nausea/voming/Melena/GIB: Endorsing melena. Hemoglobin trending down. Coffee ground emesis in ED. FOBT positive from above and below. CT with esophageal edema/thickening. Normal lipase, LFTs. History of multiple adenomatous polyps in 2013, and Esophageal candidiasis.  - GI consulted >> Upper and lower endoscopy today.  - Melena and Urobilinogen 4.0 >> suspect Upper GI - Monitor CBC. Trending down from 11.5 > 10.2 at last check. AM CBC pending.  - Daily protonix per GI recs.  - Hx of esophageal candida on last scope. HIV neg. In 2014. Recheck. Here.   # Afib/T-wave inversions on EKG: NSR at this point. PVC's on auscultation and by Tele.  - pending repeat EKG. Asymptomatic.  - cycle trops: neg x 1.   # Hypokalemia - Slightly low K at 3.4 on 3/7.  - Trending BMET's - Replete as indicated.   # COPD: No sign of exacerbation at this point, no meds - monitor respiratory status  # chronic/systolic CHF: Last Echo (06/09/74) w/ EF 50-55%. EF 30-35% in 2013. No meds at home, no sign of volume overload here.  - Monitor volume status   # H/o alcohol abuse - CIWA has been 1-2 for anxiety and orientation.  - thiamine and folate here  - MCV 83 though in the setting of likely blood loss / iron deficiency anemia.   # HTN: amlodipine rx'ed, not taking any meds - amlodipine 5mg   daily.  - Normotensive here.   FEN/GI: NPO >> advance diet as tolerated after endoscopy.  Prophylaxis: SCDs  Disposition: Pending endoscopy and plan.   Subjective:  No acute events overnight. Denies further melena, hematemesis, fever, chills, nausea, or vomiting. Feels some slight epigastric pain this am, but otherwise no complaints.    Objective: Temp:  [97.8 F (36.6 C)-99.3 F (37.4 C)] 97.8 F (36.6 C) (03/08 0511) Pulse Rate:  [84-85] 84 (03/07 2119) Resp:  [20-24] 20 (03/08 0511) BP: (110-138)/(51-75) 110/51 mmHg (03/08 0511) SpO2:  [95 %-97 %] 96 % (03/08 0511) Physical Exam: General: NAD, AAOx3  HEENT: NCAT, Discheveled, PERRLA, No LAD Cardiovascular: RRR, Occasional PVC's, no MRG, 2+ distal pulses Respiratory: CTAB, Appropriate rate, unlabored.  Abdomen: S, ND, Mild TTP over epigastrum, all other quadrants nontender, +BS, No organomegaly.  Extremities: WWP, no peripheral edema Skin: no rashes, no lesions.  Neuro: AAOx3, no focal deficits.   Laboratory:  Recent Labs Lab 04/17/14 0915 04/18/14 0728  WBC 9.6 11.7*  HGB 11.5* 10.2*  HCT 35.6* 32.4*  PLT 373 319    Recent Labs Lab 04/17/14 0915 04/18/14 0728  NA 139 137  K 3.8 3.4*  CL 103 106  CO2 25 25  BUN 26* 12  CREATININE 1.08 1.04  CALCIUM 8.9 8.1*  PROT 6.8  --   BILITOT 0.5  --   ALKPHOS 109  --   ALT 10  --   AST 19  --   GLUCOSE 126*  103*   POC Gastric Occult Blood -- Positive Lipase 36 (N)  Imaging/Diagnostic Tests: CT Abd/Pelvis 3/6 IMPRESSION: 1. Large amount of stool within the rectum, suggesting fecal impaction. No other explanation for chronic abdominal pain. 2. Small hiatal hernia with distal esophageal wall thickening and new or increased edema surrounding the distal esophagus. Contiguous edema versus fluid seen adjacent to a markedly atherosclerotic irregular lower thoracic aorta. Favor the process secondary to esophagitis. Consider correlation with endoscopy to exclude  ulcer or malignancy. Concurrent vascular pathology secondary to vasculitis or even small volume periaortic hemorrhage is felt less likely. 3. Advanced atherosclerosis with aortic and left common iliac artery ectasia. 4. Cholelithiasis. 5. Centrilobular emphysema with basilar mild pulmonary fibrosis.   Aquilla Hacker, MD 04/19/2014, 8:30 AM PGY-1, [place] Intern pager: [phone], text pages welcome

## 2014-04-19 NOTE — Clinical Social Work Note (Signed)
CSW met with patient at bedside to complete VI-SPDAT screener for a referral to [facility]. Referral has been sent to Ms. Curtains with program and agency will be responsible for following up with patient. After completion of referral patient stated that he has actually "chosen" to be homeless and will only accept help with permanent housing if he feels like it will "fit" for him. No other CSW needs identified at this time. CSW signing off.   Liz Beach MSW, [place], [place], [phone]

## 2014-04-20 ENCOUNTER — Inpatient Hospital Stay (HOSPITAL_COMMUNITY): Payer: Medicare Other | Admitting: Anesthesiology

## 2014-04-20 ENCOUNTER — Encounter (HOSPITAL_COMMUNITY)
Admission: EM | Disposition: A | Discharge status: Home / Self Care | Payer: Self-pay | Source: Home / Self Care | Attending: Family Medicine

## 2014-04-20 ENCOUNTER — Encounter (HOSPITAL_COMMUNITY): Payer: Self-pay | Admitting: Certified Registered Nurse Anesthetist

## 2014-04-20 DIAGNOSIS — K2980 Duodenitis without bleeding: Secondary | ICD-10-CM

## 2014-04-20 DIAGNOSIS — K209 Esophagitis, unspecified without bleeding: Secondary | ICD-10-CM | POA: Insufficient documentation

## 2014-04-20 DIAGNOSIS — K920 Hematemesis: Secondary | ICD-10-CM | POA: Insufficient documentation

## 2014-04-20 DIAGNOSIS — D124 Benign neoplasm of descending colon: Secondary | ICD-10-CM | POA: Insufficient documentation

## 2014-04-20 DIAGNOSIS — Z860100 Personal history of colon polyps, unspecified: Secondary | ICD-10-CM | POA: Insufficient documentation

## 2014-04-20 DIAGNOSIS — D125 Benign neoplasm of sigmoid colon: Secondary | ICD-10-CM | POA: Insufficient documentation

## 2014-04-20 DIAGNOSIS — D123 Benign neoplasm of transverse colon: Secondary | ICD-10-CM

## 2014-04-20 DIAGNOSIS — D122 Benign neoplasm of ascending colon: Secondary | ICD-10-CM | POA: Insufficient documentation

## 2014-04-20 HISTORY — PX: ESOPHAGOGASTRODUODENOSCOPY: SHX5428

## 2014-04-20 HISTORY — PX: COLONOSCOPY: SHX5424

## 2014-04-20 LAB — CBC WITH DIFFERENTIAL/PLATELET
BASOS PCT: 1 % (ref 0–1)
Basophils Absolute: 0.1 10*3/uL (ref 0.0–0.1)
EOS PCT: 3 % (ref 0–5)
Eosinophils Absolute: 0.2 10*3/uL (ref 0.0–0.7)
HCT: 33.2 % — ABNORMAL LOW (ref 39.0–52.0)
HEMOGLOBIN: 10.4 g/dL — AB (ref 13.0–17.0)
Lymphocytes Relative: 18 % (ref 12–46)
Lymphs Abs: 1.4 10*3/uL (ref 0.7–4.0)
MCH: 26.3 pg (ref 26.0–34.0)
MCHC: 31.3 g/dL (ref 30.0–36.0)
MCV: 84.1 fL (ref 78.0–100.0)
Monocytes Absolute: 0.7 10*3/uL (ref 0.1–1.0)
Monocytes Relative: 9 % (ref 3–12)
Neutro Abs: 5.4 10*3/uL (ref 1.7–7.7)
Neutrophils Relative %: 71 % (ref 43–77)
PLATELETS: 295 10*3/uL (ref 150–400)
RBC: 3.95 MIL/uL — ABNORMAL LOW (ref 4.22–5.81)
RDW: 15.1 % (ref 11.5–15.5)
WBC: 7.7 10*3/uL (ref 4.0–10.5)

## 2014-04-20 LAB — BASIC METABOLIC PANEL
ANION GAP: 7 (ref 5–15)
BUN: 7 mg/dL (ref 6–23)
CALCIUM: 8.5 mg/dL (ref 8.4–10.5)
CHLORIDE: 107 mmol/L (ref 96–112)
CO2: 24 mmol/L (ref 19–32)
Creatinine, Ser: 1.11 mg/dL (ref 0.50–1.35)
GFR calc non Af Amer: 60 mL/min — ABNORMAL LOW (ref >90)
GFR, EST AFRICAN AMERICAN: 69 mL/min — AB (ref >90)
Glucose, Bld: 92 mg/dL (ref 70–99)
POTASSIUM: 4 mmol/L (ref 3.5–5.1)
Sodium: 138 mmol/L (ref 135–145)

## 2014-04-20 LAB — HIV ANTIBODY (ROUTINE TESTING W REFLEX): HIV SCREEN 4TH GENERATION: NONREACTIVE

## 2014-04-20 SURGERY — COLONOSCOPY
Anesthesia: Monitor Anesthesia Care

## 2014-04-20 MED ORDER — FENTANYL CITRATE 0.05 MG/ML IJ SOLN: 50.0000 ug | INTRAMUSCULAR | Status: DC | PRN

## 2014-04-20 MED ORDER — PROMETHAZINE HCL 25 MG/ML IJ SOLN: 6.2500 mg | INTRAMUSCULAR | Status: DC | PRN

## 2014-04-20 MED ORDER — ONDANSETRON HCL 4 MG/2ML IJ SOLN
INTRAMUSCULAR | Status: DC | PRN
  Administered 2014-04-20: 4 mg via INTRAVENOUS

## 2014-04-20 MED ORDER — EPHEDRINE SULFATE 50 MG/ML IJ SOLN
INTRAMUSCULAR | Status: DC | PRN
Start: 2014-04-20 — End: 2014-04-20
  Administered 2014-04-20: 5 mg via INTRAVENOUS

## 2014-04-20 MED ORDER — BUTAMBEN-TETRACAINE-BENZOCAINE 2-2-14 % EX AERO
INHALATION_SPRAY | CUTANEOUS | Status: DC | PRN
  Administered 2014-04-20: 2 via TOPICAL

## 2014-04-20 MED ORDER — PANTOPRAZOLE SODIUM 40 MG PO TBEC
40.0000 mg | DELAYED_RELEASE_TABLET | Freq: Two times a day (BID) | ORAL | Status: DC
  Administered 2014-04-20 – 2014-04-22 (×4): 40 mg via ORAL
  Filled 2014-04-20 (×4): qty 1

## 2014-04-20 MED ORDER — FENTANYL CITRATE 0.05 MG/ML IJ SOLN: 25.0000 ug | INTRAMUSCULAR | Status: DC | PRN

## 2014-04-20 MED ORDER — MEPERIDINE HCL 25 MG/ML IJ SOLN: 6.2500 mg | INTRAMUSCULAR | Status: DC | PRN

## 2014-04-20 MED ORDER — SODIUM CHLORIDE 0.9 % IV SOLN: INTRAVENOUS | Status: DC

## 2014-04-20 MED ORDER — LACTATED RINGERS IV SOLN
INTRAVENOUS | Status: DC | PRN
  Administered 2014-04-20: 11:00:00 via INTRAVENOUS

## 2014-04-20 MED ORDER — PROPOFOL INFUSION 10 MG/ML OPTIME
INTRAVENOUS | Status: DC | PRN
  Administered 2014-04-20: 75 ug/kg/min via INTRAVENOUS

## 2014-04-20 MED ORDER — MIDAZOLAM HCL 2 MG/2ML IJ SOLN: 1.0000 mg | INTRAMUSCULAR | Status: DC | PRN

## 2014-04-20 MED ORDER — PROPOFOL 10 MG/ML IV BOLUS
INTRAVENOUS | Status: DC | PRN
  Administered 2014-04-20: 10 mg via INTRAVENOUS
  Administered 2014-04-20: 20 mg via INTRAVENOUS

## 2014-04-20 MED ORDER — LACTATED RINGERS IV SOLN
INTRAVENOUS | Status: DC
  Administered 2014-04-20: 100 mL/h via INTRAVENOUS

## 2014-04-20 MED ORDER — MIDAZOLAM HCL 5 MG/5ML IJ SOLN
INTRAMUSCULAR | Status: DC | PRN
  Administered 2014-04-20: 2 mg via INTRAVENOUS

## 2014-04-20 MED ORDER — LIDOCAINE HCL (CARDIAC) 20 MG/ML IV SOLN
INTRAVENOUS | Status: DC | PRN
  Administered 2014-04-20: 100 mg via INTRAVENOUS

## 2014-04-20 MED ORDER — PHENYLEPHRINE HCL 10 MG/ML IJ SOLN
INTRAMUSCULAR | Status: DC | PRN
Start: 2014-04-20 — End: 2014-04-20
  Administered 2014-04-20: 120 ug via INTRAVENOUS
  Administered 2014-04-20: 80 ug via INTRAVENOUS
  Administered 2014-04-20: 120 ug via INTRAVENOUS
  Administered 2014-04-20: 80 ug via INTRAVENOUS

## 2014-04-20 NOTE — Anesthesia Postprocedure Evaluation (Signed)
  Anesthesia Post-op Note  Patient: Dale Mcfarland  Procedure(s) Performed: Procedure(s): COLONOSCOPY (N/A) ESOPHAGOGASTRODUODENOSCOPY (EGD) (N/A)  Patient Location: PACU  Anesthesia Type:MAC  Level of Consciousness: awake  Airway and Oxygen Therapy: Patient Spontanous Breathing  Post-op Pain: mild  Post-op Assessment: Post-op Vital signs reviewed  Post-op Vital Signs: Reviewed  Last Vitals:  Filed Vitals:   04/20/14 1303  BP: 159/55  Pulse: 73  Temp: 36.4 C  Resp:     Complications: No apparent anesthesia complications

## 2014-04-20 NOTE — Progress Notes (Signed)
Family Medicine Teaching Service Daily Progress Note Intern Pager: [phone]  Patient name: Dale Mcfarland Medical record number: [id number] Date of birth: 1929/06/05 Age: 81 y.o. Gender: male  Primary Care Provider: No PCP Per Patient Consultants: GI Code Status: Full  Pt Overview and Major Events to Date:  3/6: Patient admitted for n/v w/ coffee ground emesis 3/7-3/8: Hgb trending down. GI consulted and plan for Endoscopies U/L. 3/9: EGD and colonoscopy to be performed today   Assessment and Plan: Dale Mcfarland is a 81 y.o. male presenting with n/v and abdominal pain x2 days . PMH is significant for afib, COPD, GIB, CHF, anemia, homelessness and alcohol abuse.  # Nausea/voming/Melena/GIB: Endorsing melena. Hemoglobin trending down. Coffee ground emesis in ED. FOBT positive from above and below. CT with esophageal edema/thickening. Normal lipase, LFTs. History of multiple adenomatous polyps in 2013, and Esophageal candidiasis.  - GI consulted >> Upper and lower endoscopy today.  - Melena and Urobilinogen 4.0 >> suspect Upper GI - Monitor CBC. Trending down from 11.5 > 10.2 > 9.9 - Daily protonix per GI recs.  - Hx of esophageal candida on last scope. HIV neg. In 2014. Recheck. Here.   # Afib/T-wave inversions on EKG: NSR at this point. PVC's on auscultation and by Tele.  - pending repeat EKG. Asymptomatic.  - cycle trops: neg x 1.   # Hypokalemia - Resolved; Slightly low K at 3.4 on 3/7. Currently 3.7 (3/8) - Trending BMET's - Replete as indicated.   # COPD: No sign of exacerbation at this point, no meds - monitor respiratory status  # chronic/systolic CHF: Last Echo (0/32/12) w/ EF 50-55%. EF 30-35% in 2013. No meds at home, no sign of volume overload here.  - Monitor volume status   # H/o alcohol abuse - CIWA has been 1-2 for anxiety and orientation.  - thiamine and folate here  - MCV 83 though in the setting of likely blood loss / iron deficiency anemia.   # HTN: amlodipine  rx'ed, not taking any meds - amlodipine 5mg  daily.  - Normotensive here.   FEN/GI: NPO >> advance diet as tolerated after endoscopy.  Prophylaxis: SCDs  Disposition: Pending endoscopy and plan.   Subjective:  Feels pretty good. Continues to have some slight epigastric discomfort. Very grateful for our care. Denies any symptoms at this time.   Objective: Temp:  [97.9 F (36.6 C)-98.3 F (36.8 C)] 98 F (36.7 C) (03/09 2482) Pulse Rate:  [67-90] 90 (03/09 0633) Resp:  [16-18] 18 (03/09 0633) BP: (120-135)/(57-76) 120/64 mmHg (03/09 0633) SpO2:  [95 %-100 %] 99 % (03/09 5003) Physical Exam: General: NAD, AAOx3  HEENT: NCAT, Discheveled, PERRLA, No LAD Cardiovascular: RRR, Occasional PVC's, no MRG, 2+ distal pulses Respiratory: CTAB, Appropriate rate, unlabored.  Abdomen: S, ND, Mild TTP over epigastrum, all other quadrants nontender, +BS, No organomegaly.  Extremities: WWP, no peripheral edema Skin: no rashes, no lesions.  Neuro: AAOx3, no focal deficits.   Laboratory:  Recent Labs Lab 04/17/14 0915 04/18/14 0728 04/19/14 0829  WBC 9.6 11.7* 9.5  HGB 11.5* 10.2* 9.9*  HCT 35.6* 32.4* 30.7*  PLT 373 319 274    Recent Labs Lab 04/17/14 0915 04/18/14 0728 04/19/14 0829  NA 139 137 136  K 3.8 3.4* 3.7  CL 103 106 105  CO2 25 25 25   BUN 26* 12 8  CREATININE 1.08 1.04 1.09  CALCIUM 8.9 8.1* 8.3*  PROT 6.8  --   --   BILITOT 0.5  --   --  ALKPHOS 109  --   --   ALT 10  --   --   AST 19  --   --   GLUCOSE 126* 103* 92   POC Gastric Occult Blood -- Positive Lipase 36 (N)  Imaging/Diagnostic Tests: CT Abd/Pelvis 3/6 IMPRESSION: 1. Large amount of stool within the rectum, suggesting fecal impaction. No other explanation for chronic abdominal pain. 2. Small hiatal hernia with distal esophageal wall thickening and new or increased edema surrounding the distal esophagus. Contiguous edema versus fluid seen adjacent to a markedly atherosclerotic irregular  lower thoracic aorta. Favor the process secondary to esophagitis. Consider correlation with endoscopy to exclude ulcer or malignancy. Concurrent vascular pathology secondary to vasculitis or even small volume periaortic hemorrhage is felt less likely. 3. Advanced atherosclerosis with aortic and left common iliac artery ectasia. 4. Cholelithiasis. 5. Centrilobular emphysema with basilar mild pulmonary fibrosis.   Elberta Leatherwood, MD 04/20/2014, 8:45 AM PGY-1, [place] Intern pager: [phone], text pages welcome

## 2014-04-20 NOTE — Transfer of Care (Signed)
Immediate Anesthesia Transfer of Care Note  Patient: Dale Mcfarland  Procedure(s) Performed: Procedure(s): COLONOSCOPY (N/A) ESOPHAGOGASTRODUODENOSCOPY (EGD) (N/A)  Patient Location: Endoscopy Unit  Anesthesia Type:MAC  Level of Consciousness: sedated, patient cooperative and responds to stimulation  Airway & Oxygen Therapy: Patient Spontanous Breathing and Patient connected to nasal cannula oxygen  Post-op Assessment: Report given to RN and Post -op Vital signs reviewed and stable  Post vital signs: Reviewed and stable  Last Vitals:  Filed Vitals:   04/20/14 0927  BP: 139/62  Pulse: 64  Temp: 36.5 C  Resp: 18    Complications: No apparent anesthesia complications

## 2014-04-20 NOTE — Op Note (Signed)
[facility] [place], [address]   ENDOSCOPY PROCEDURE REPORT  PATIENT: Dale Mcfarland, Dale Mcfarland  MR#: [id number] BIRTHDATE: 1930-01-18 , 84  yrs. old GENDER: male ENDOSCOPIST: Jerene Bears, MD REFERRED BY:  [facility] PROCEDURE DATE:  04/20/2014 PROCEDURE:  EGD w/ biopsy ASA CLASS:     Class III INDICATIONS:  hematemesis. MEDICATIONS: Monitored anesthesia care TOPICAL ANESTHETIC: none  DESCRIPTION OF PROCEDURE: After the risks benefits and alternatives of the procedure were thoroughly explained, informed consent was obtained.  The Pentax Gastroscope [id number] endoscope was introduced through the mouth and advanced to the second portion of the duodenum , Without limitations.  The instrument was slowly withdrawn as the mucosa was fully examined.   ESOPHAGUS: There was severe, LA Class D, esophagitis (Mucosal breaks involving more than 75% of esophageal circumference) noted in the mid and distal esophagus.   A single non-bleeding and clean-based ulcer measuring 15 x 23mm in size with heaped up edges was found in the lower third of the esophagus.  This lesion is concerning for neoplasm.  Biopsies were taken at edge of the ulcer.  STOMACH: A 4 cm hiatal hernia was noted.   The stomach otherwise appeared normal.   Cold forcep biopsies were taken at the antrum and angularis to evaluate for h.  pylori.  DUODENUM: Moderate duodenal inflammation was found in the duodenal bulb.   The duodenal mucosa showed no abnormalities in the 2nd part of the duodenum.  Retroflexed views revealed as previously described.     The scope was then withdrawn from the patient and the procedure completed.  COMPLICATIONS: There were no immediate complications.   ENDOSCOPIC IMPRESSION: 1.   There was severe esophagitis noted 2.   Single ulcer concerning for neoplasm was found in the lower third of the esophagus; biopsies were taken 3.   4 cm hiatal hernia 4.   The  stomach otherwise appeared normal; biopsies taken from the body and antrum of the stomach 5.   Duodenal inflammation was found in the duodenal bulb 6.   The duodenal mucosa showed no abnormalities in the 2nd part of the duodenum  RECOMMENDATIONS: 1.  Await biopsy results 2.  Twice daily PPI 3.  No NSAIDs   eSigned:  Jerene Bears, MD 04/20/2014 11:51 AM    CC:the patient  PATIENT NAME:  [name], [name] MR#: [id number]

## 2014-04-20 NOTE — Interval H&P Note (Signed)
History and Physical Interval Note: Patient with recent hematemesis with CT showing reckoning in distal esophagus. Also history of colon polyps and intermittent rectal bleeding Plan for upper endoscopy and colonoscopy today with monitored anesthesia care The nature of the procedure, as well as the risks, benefits, and alternatives were carefully and thoroughly reviewed with the patient. Ample time for discussion and questions allowed. The patient understood, was satisfied, and agreed to proceed.    04/20/2014 10:16 AM  [name]  has presented today for surgery, with the diagnosis of Anemia, history adenomatous colon polyps, recent limited coffee ground emesis. Esophagitis on CT scan  The various methods of treatment have been discussed with the patient and family. After consideration of risks, benefits and other options for treatment, the patient has consented to  Procedure(s): COLONOSCOPY (N/A) ESOPHAGOGASTRODUODENOSCOPY (EGD) (N/A) as a surgical intervention .  The patient's history has been reviewed, patient examined, no change in status, stable for surgery.  I have reviewed the patient's chart and labs.  Questions were answered to the patient's satisfaction.     Dale Mcfarland

## 2014-04-20 NOTE — H&P (View-Only) (Signed)
Daily Rounding Note  04/19/2014, 10:26 AM  LOS: 2 days   SUBJECTIVE:       Had BM yesterday, hungry on clears today.  Colonoscopy/egd rescheduled for 3/9 No nausea, vomiting, no abdominal pain .  Breathing easily, no complaints  OBJECTIVE:         Vital signs in last 24 hours:    Temp:  [97.8 F (36.6 C)-99.3 F (37.4 C)] 97.8 F (36.6 C) (03/08 0511) Pulse Rate:  [84-85] 84 (03/07 2119) Resp:  [20-24] 20 (03/08 0511) BP: (110-138)/(51-75) 110/51 mmHg (03/08 0511) SpO2:  [95 %-97 %] 96 % (03/08 0511) Last BM Date: 04/17/14 Filed Weights   04/17/14 1341  Weight: 126 lb 4.8 oz (57.289 kg)   General: looks well.    Heart: RRR Chest: clear bil.  No dyspnea Abdomen: soft, NT, ND.  Active BS  Extremities: no CCE Neuro/Psych:  Pleasant, cooperative, relaxed.  Oriented x 3.   Intake/Output from previous day: 03/07 0701 - 03/08 0700 In: 539.2 [I.V.:439.2; IV Piggyback:100] Out: 500 [Urine:500]  Intake/Output this shift: Total I/O In: 200 [P.O.:200] Out: 100 [Urine:100]  Lab Results:  Recent Labs  04/17/14 0915 04/18/14 0728  WBC 9.6 11.7*  HGB 11.5* 10.2*  HCT 35.6* 32.4*  PLT 373 319   BMET  Recent Labs  04/17/14 0915 04/18/14 0728  NA 139 137  K 3.8 3.4*  CL 103 106  CO2 25 25  GLUCOSE 126* 103*  BUN 26* 12  CREATININE 1.08 1.04  CALCIUM 8.9 8.1*   LFT  Recent Labs  04/17/14 0915  PROT 6.8  ALBUMIN 3.4*  AST 19  ALT 10  ALKPHOS 109  BILITOT 0.5   PT/INR  Recent Labs  04/17/14 0915  LABPROT 14.8  INR 1.15   Hepatitis Panel No results for input(s): HEPBSAG, HCVAB, HEPAIGM, HEPBIGM in the last 72 hours.  Studies/Results: Ct Abdomen Pelvis W Contrast  04/17/2014   CLINICAL DATA:  Generalized abdominal pain for 2 years. Cough. History of upper GI bleed. Alcoholism. COPD. Tobacco abuse. CHF.  EXAM: CT ABDOMEN AND PELVIS WITH CONTRAST  TECHNIQUE: Multidetector CT imaging of the  abdomen and pelvis was performed using the standard protocol following bolus administration of intravenous contrast.  CONTRAST:  113mL OMNIPAQUE IOHEXOL 300 MG/ML  SOLN  COMPARISON:  03/01/2014  FINDINGS: Lower chest: Centrilobular emphysema. Subpleural reticulation which is again suspicious for mild pulmonary fibrosis at the lung bases. Mild cardiomegaly, without pericardial or pleural effusion.  A small hiatal hernia. Thickening of the distal esophagus with surrounding mediastinal soft tissue thickening. Similar to slightly increased on image 1 today compared to prior. Soft tissue thickening or fluid is also identified adjacent to the descending thoracic aorta. This is new or increased since the prior exam. There is extensive ulcerative plaque within this portion of aorta.  Hepatobiliary: Normal liver. Multiple gallstones without biliary duct dilatation or evidence acute cholecystitis.  Pancreas: Pancreatic atrophy which is mild. No dominant mass or evidence of pancreatitis.  Spleen: Normal  Adrenals/Urinary Tract: Normal adrenal glands. Mild renal cortical thinning bilaterally. Interpolar left renal cyst of 9 mm. Bilateral too small to characterize renal lesions. No hydronephrosis. Normal urinary bladder.  Stomach/Bowel: Normal distal stomach. Large amount of stool within the rectum. Extensive colonic diverticulosis. Normal terminal ileum. Normal appendix on coronal image 55. Normal small bowel.  Vascular/Lymphatic: Extensive atherosclerotic irregularity within the aorta and branch vessels. Ectasia of the suprarenal aorta is similar. Infrarenal aortic ectasia  is also similar at 3.6 cm.  Similar left common iliac ectasia at 1.5 cm. No abdominopelvic adenopathy.  Reproductive: Moderate prostatomegaly.  Other: Small left fat containing inguinal hernia. No significant free fluid.  Musculoskeletal: Bilateral lower remote rib fractures. A moderate L2 compression deformity is similar with minimal ventral canal  encroachment  IMPRESSION: 1. Large amount of stool within the rectum, suggesting fecal impaction. No other explanation for chronic abdominal pain. 2. Small hiatal hernia with distal esophageal wall thickening and new or increased edema surrounding the distal esophagus. Contiguous edema versus fluid seen adjacent to a markedly atherosclerotic irregular lower thoracic aorta. Favor the process secondary to esophagitis. Consider correlation with endoscopy to exclude ulcer or malignancy. Concurrent vascular pathology secondary to vasculitis or even small volume periaortic hemorrhage is felt less likely. 3. Advanced atherosclerosis with aortic and left common iliac artery ectasia. 4. Cholelithiasis. 5. Centrilobular emphysema with basilar mild pulmonary fibrosis.   Electronically Signed   By: Abigail Miyamoto M.D.   On: 04/17/2014 12:09    ASSESMENT:   * Hematemesis in a homeless alcohollic CT showing changes in distal esophagus. Rule out esophagitis, r/o neoplasia.   * Fecal impaction.   * Normocytic anemia.   *  Hypokalemia.  Mild  * Uncomplicated gallstones. LFTs normal.   * Atherosclerosis of aorta and branch vessels, left common iliac.   * Emphysema.   * Hx Afib. Not AC candidate due to non-compliance.     PLAN   *  Colon/EGD have been rescheduled for tomorrow at 10 AM.  Split dose movi prep ordered to start tonite. On clears.  *  Also ordered 20 meq of oral potassium, as anesthesia has cancelled endo cases for hypokalemia in past. BMET for AM in place.     Azucena Freed  04/19/2014, 10:26 AM Pager: [phone]

## 2014-04-20 NOTE — Op Note (Signed)
[facility] [place], [address]   COLONOSCOPY PROCEDURE REPORT  PATIENT: Dale, Mcfarland  MR#: [id number] BIRTHDATE: 1929/06/14 , 84  yrs. old GENDER: male ENDOSCOPIST: Jerene Bears, MD REFERRED EK:[facility] PROCEDURE DATE:  04/20/2014 PROCEDURE:   Colonoscopy with snare polypectomy First Screening Colonoscopy - Avg.  risk and is 50 yrs.  old or older - No.  Prior Negative Screening - Now for repeat screening. N/A  History of Adenoma - Now for follow-up colonoscopy & has been > or = to 3 yrs.  N/A  Polyps Removed Today? Yes. ASA CLASS:   Class III INDICATIONS:high risk patient with personal history of colonic polyps and recent hematemesis. MEDICATIONS: Monitored anesthesia care and Per Anesthesia  DESCRIPTION OF PROCEDURE:   After the risks benefits and alternatives of the procedure were thoroughly explained, informed consent was obtained.  The digital rectal exam revealed no rectal mass.   The Pentax Adult Colon [id number]  endoscope was introduced through the anus and advanced to the cecum, which was identified by both the appendix and ileocecal valve. No adverse events experienced.   The quality of the prep was (MoviPrep was used) good.  The instrument was then slowly withdrawn as the colon was fully examined.  COLON FINDINGS: Twelve sessile and pedunculated polyps ranging from 5 to 3mm in size were found in the transverse colon (4), ascending colon (1), sigmoid colon (6), and descending colon (2). Polypectomies were performed using snare cautery (5) and with a cold snare (7).  The resection was complete, the polyp tissue was completely retrieved and sent to histology.   There was moderate diverticulosis noted in the left colon.  Retroflexed views revealed internal hemorrhoids.      The scope was withdrawn and the procedure completed.  COMPLICATIONS: There were no immediate complications.  ENDOSCOPIC IMPRESSION: 1.   12  sessile and pedunculated polyps ranging from 5 to 61mm in size were found in the transverse colon, ascending colon, sigmoid colon, and descending colon; polypectomies were performed using snare cautery and with a cold snare 2.   Moderate diverticulosis was noted in the left colon  RECOMMENDATIONS: 1.  Hold Aspirin and all other NSAIDS for at least 2 weeks. 2.  Await pathology results 3.  You will receive a letter within 1-2 weeks with the results of your biopsy as well as final recommendations.  Please call my office if you have not received a letter after 3 weeks.  eSigned:  Jerene Bears, MD 04/20/2014 11:45 AM   cc: the patient   PATIENT NAME:  Dale, Mcfarland MR#: [id number]

## 2014-04-20 NOTE — Anesthesia Postprocedure Evaluation (Signed)
  Anesthesia Post-op Note  Patient: Dale Mcfarland  Procedure(s) Performed: Procedure(s): COLONOSCOPY (N/A) ESOPHAGOGASTRODUODENOSCOPY (EGD) (N/A)  Patient Location: Endoscopy Unit  Anesthesia Type:MAC  Level of Consciousness: sedated, patient cooperative and responds to stimulation  Airway and Oxygen Therapy: Patient Spontanous Breathing and Patient connected to nasal cannula oxygen  Post-op Pain: none  Post-op Assessment: Post-op Vital signs reviewed, Patient's Cardiovascular Status Stable, Respiratory Function Stable and Patent Airway  Post-op Vital Signs: Reviewed and stable  Last Vitals:  Filed Vitals:   04/20/14 0927  BP: 139/62  Pulse: 64  Temp: 36.5 C  Resp: 18    Complications: No apparent anesthesia complications

## 2014-04-20 NOTE — Care Management Note (Addendum)
    Page 1 of 1   04/22/2014     11:08:55 AM CARE MANAGEMENT NOTE 04/22/2014  Patient:  Dale Mcfarland, Dale Mcfarland   Account Number:  [id number]  Date Initiated:  04/20/2014  Documentation initiated by:  Tomi Bamberger  Subjective/Objective Assessment:   dx gib  admit-homeless     Action/Plan:   Anticipated DC Date:  04/20/2014   Anticipated DC Plan:  HOMELESS SHELTER  In-house referral  Clinical Social Worker      DC Planning Services  CM consult      Choice offered to / List presented to:             Status of service:  Completed, signed off Medicare Important Message given?  YES (If response is "NO", the following Medicare IM given date fields will be blank) Date Medicare IM given:  04/20/2014 Medicare IM given by:  Tomi Bamberger Date Additional Medicare IM given:  04/22/2014 Additional Medicare IM given by:  Lorne Skeens  Discharge Disposition:  HOME/SELF CARE  Per UR Regulation:  Reviewed for med. necessity/level of care/duration of stay  If discussed at [place] of Stay Meetings, dates discussed:    Comments:  04/22/14 [place], MSN, CM- Additional Medicare IM letter provided.    04/20/14 [place] BSN [phone] patient is for dc today, CSW following.

## 2014-04-21 ENCOUNTER — Encounter (HOSPITAL_COMMUNITY): Payer: Self-pay | Admitting: Internal Medicine

## 2014-04-21 DIAGNOSIS — K920 Hematemesis: Secondary | ICD-10-CM

## 2014-04-21 LAB — BASIC METABOLIC PANEL
Anion gap: 3 — ABNORMAL LOW (ref 5–15)
BUN: 9 mg/dL (ref 6–23)
CO2: 28 mmol/L (ref 19–32)
CREATININE: 0.98 mg/dL (ref 0.50–1.35)
Calcium: 8.5 mg/dL (ref 8.4–10.5)
Chloride: 105 mmol/L (ref 96–112)
GFR calc Af Amer: 86 mL/min — ABNORMAL LOW (ref >90)
GFR calc non Af Amer: 74 mL/min — ABNORMAL LOW (ref >90)
GLUCOSE: 91 mg/dL (ref 70–99)
POTASSIUM: 3.8 mmol/L (ref 3.5–5.1)
Sodium: 136 mmol/L (ref 135–145)

## 2014-04-21 LAB — CBC WITH DIFFERENTIAL/PLATELET
BASOS PCT: 0 % (ref 0–1)
Basophils Absolute: 0 10*3/uL (ref 0.0–0.1)
Eosinophils Absolute: 0.3 10*3/uL (ref 0.0–0.7)
Eosinophils Relative: 4 % (ref 0–5)
HEMATOCRIT: 31.4 % — AB (ref 39.0–52.0)
Hemoglobin: 10 g/dL — ABNORMAL LOW (ref 13.0–17.0)
Lymphocytes Relative: 23 % (ref 12–46)
Lymphs Abs: 1.7 10*3/uL (ref 0.7–4.0)
MCH: 26.3 pg (ref 26.0–34.0)
MCHC: 31.8 g/dL (ref 30.0–36.0)
MCV: 82.6 fL (ref 78.0–100.0)
MONOS PCT: 8 % (ref 3–12)
Monocytes Absolute: 0.6 10*3/uL (ref 0.1–1.0)
NEUTROS ABS: 4.8 10*3/uL (ref 1.7–7.7)
Neutrophils Relative %: 65 % (ref 43–77)
Platelets: 280 10*3/uL (ref 150–400)
RBC: 3.8 MIL/uL — AB (ref 4.22–5.81)
RDW: 15.3 % (ref 11.5–15.5)
WBC: 7.5 10*3/uL (ref 4.0–10.5)

## 2014-04-21 MED ORDER — PANTOPRAZOLE SODIUM 40 MG PO TBEC: 40.0000 mg | DELAYED_RELEASE_TABLET | Freq: Two times a day (BID) | ORAL | Status: AC

## 2014-04-21 NOTE — Discharge Summary (Signed)
[facility] Discharge Summary  Patient name: Dale Mcfarland Medical record number: [id number] Date of birth: January 15, 1930 Age: 81 y.o. Gender: male Date of Admission: 04/17/2014  Date of Discharge: 04/22/14 Admitting Physician: Lupita Dawn, MD  Primary Care Provider: No PCP Per Patient Consultants: GI, Psych  Indication for Hospitalization:  Intractable nausea and vomiting Coffee-ground emesis FOBT positive (gastric and rectal)  Discharge Diagnoses/Problem List:  Nausea, vomiting, melena A. fib with T-wave inversions Hypokalemia COPD Chronic systolic CHF  Disposition: Home  Discharge Condition: Stable  Brief Hospital Course:  Patient is a 81 year old male who presented with nausea vomiting and coffee-ground emesis. He denied any melena or bright red blood per rectum. FOBT was obtained which was positive from both rectal and gastric samples. Patient was admitted due to gastrointestinal bleed. Hemoglobin stable on admission at 11.5. GI was consulted and performed an EGD and colonoscopy on 3/9. Patient's hemoglobin remained stable after these procedures. On 3/10 CSW asked psych eval for capacity as well as a physical therapy evaluation. These requests kept patient overnight. Patient was deemed capable to make his own decision-making by psych. Patient was deemed medically stable for discharge at that time. Follow-up appointment in our clinic for establishments with a primary care provider was also made prior to his discharge.  Issues for Follow Up:  1. Medication compliance and establishment with PCP 2. Biopsy results and indications  Significant Procedures:  EGD with biopsy Colonoscopy with polyp removal and biopsy  Significant Labs and Imaging:   Recent Labs Lab 04/20/14 0733 04/21/14 0717 04/22/14 0524  WBC 7.7 7.5 7.7  HGB 10.4* 10.0* 10.1*  HCT 33.2* 31.4* 32.3*  PLT 295 280 330    Recent Labs Lab 04/20/14 0733 04/21/14 0717 04/22/14 0524   NA 138 136 139  K 4.0 3.8 4.4  CL 107 105 104  CO2 24 28 27   GLUCOSE 92 91 96  BUN 7 9 12   CREATININE 1.11 0.98 1.21  CALCIUM 8.5 8.5 8.6    Results/Tests Pending at Time of Discharge:  Gastric biopsy results Colonic polyp biopsy results  Discharge Medications:    Medication List    STOP taking these medications        predniSONE 10 MG tablet  Commonly known as:  STERAPRED UNI-PAK      TAKE these medications        amLODipine 5 MG tablet  Commonly known as:  NORVASC  Take 1 tablet (5 mg total) by mouth daily.     folic acid 1 MG tablet  Commonly known as:  FOLVITE  Take 1 tablet (1 mg total) by mouth daily.     guaiFENesin 600 MG 12 hr tablet  Commonly known as:  MUCINEX  Take 1 tablet (600 mg total) by mouth 2 (two) times daily as needed for to loosen phlegm.     HYDROcodone-acetaminophen 5-325 MG per tablet  Commonly known as:  NORCO/VICODIN  Take 1-2 tablets by mouth every 4 (four) hours as needed for moderate pain.     ondansetron 4 MG tablet  Commonly known as:  ZOFRAN  Take 1 tablet (4 mg total) by mouth every 6 (six) hours as needed for nausea or vomiting.     pantoprazole 40 MG tablet  Commonly known as:  PROTONIX  Take 1 tablet (40 mg total) by mouth 2 (two) times daily before a meal.     thiamine 100 MG tablet  Take 1 tablet (100 mg total) by mouth daily.  Discharge Instructions: Please refer to Patient Instructions section of EMR for full details.  Patient was counseled important signs and symptoms that should prompt return to medical care, changes in medications, dietary instructions, activity restrictions, and follow up appointments.   Follow-Up Appointments: Follow-up Information    Follow up with PYRTLE, Lajuan Lines, MD. Call in 2 weeks.   Specialty:  Gastroenterology   Contact information:   [address] [place] [address] [phone]       Follow up with McKeag, Marylynn Pearson, MD. Go on 05/03/2014.   Specialty:  Family Medicine    Why:  @2 :30pm   Contact information:   [address] [address] [phone]       Elberta Leatherwood, MD 04/26/2014, 4:11 PM PGY-1, [place]

## 2014-04-21 NOTE — Progress Notes (Signed)
PT Cancellation Note  Patient Details Name: Dale Mcfarland MRN: [id number] DOB: [birth year]   Cancelled Treatment:    Reason Eval/Treat Not Completed: Patient declined, no reason specified.  Pt does not wish to participate with PT and doesn't see the ned for any PT intervention.  Will cancel the evaluation based on patient's wishes. 04/21/2014  [name], [name] [phone]  (pager)   [name], [name] 04/21/2014, 3:10 PM

## 2014-04-21 NOTE — Clinical Social Work Note (Signed)
CSW met with APS social worker at [place]. Social Worker Eliezer Bottom [phone] requested medical documentation on patient and requests that MD determine patient's capacity to make decisions for himself, she also requests that a physical therapist sees the patient prior to him being discharged.   Liz Beach MSW, [place], [place], [phone]

## 2014-04-21 NOTE — Progress Notes (Signed)
Family Medicine Teaching Service Daily Progress Note Intern Pager: [phone]  Patient name: Dale Mcfarland Medical record number: [id number] Date of birth: 1929-04-06 Age: 81 y.o. Gender: male  Primary Care Provider: No PCP Per Patient Consultants: GI Code Status: Full  Pt Overview and Major Events to Date:  3/6: Patient admitted for n/v w/ coffee ground emesis 3/7-3/8: Hgb trending down. GI consulted and plan for Endoscopies U/L. 3/9: EGD and colonoscopy to be performed today  3/10: CSW has asked for psych eval for capacity and PT eval >> patient will be kept overnight for this.  Assessment and Plan: Dale Mcfarland is a 81 y.o. male presenting with n/v and abdominal pain x2 days . PMH is significant for afib, COPD, GIB, CHF, anemia, homelessness and alcohol abuse.  # Nausea/voming/Melena/GIB: Endorsing melena. Hemoglobin trending down. Coffee ground emesis in ED. FOBT positive from above and below. CT with esophageal edema/thickening. Normal lipase, LFTs. History of multiple adenomatous polyps in 2013, and Esophageal candidiasis.  - GI consulted >> Upper and lower endoscopy  - Bx's taken; no NSAIDs; BID PPI - Melena and Urobilinogen 4.0 >> suspect Upper GI - Monitor CBC. Hgb: 11.5 > 10.2 > 9.9 > 10.4 > 10.0 - Daily protonix per GI recs.  - Hx of esophageal candida on last scope. HIV neg. In 2014. Recheck. Here.   # Afib/T-wave inversions on EKG: NSR at this point. PVC's on auscultation and by Tele.  - pending repeat EKG. Asymptomatic.  - cycle trops: neg x 1.   # Hypokalemia - Resolved; Slightly low K at 3.4 on 3/7. Currently 3.7 (3/8) - Trending BMET's - Replete as indicated.   # COPD: No sign of exacerbation at this point, no meds - monitor respiratory status  # chronic/systolic CHF: Last Echo (7/37/10) w/ EF 50-55%. EF 30-35% in 2013. No meds at home, no sign of volume overload here.  - Monitor volume status   # H/o alcohol abuse - CIWA has been 1-2 for anxiety and  orientation.  - thiamine and folate here  - MCV 83 though in the setting of likely blood loss / iron deficiency anemia.   # HTN: amlodipine rx'ed, not taking any meds - amlodipine 5mg  daily.  - Normotensive here.   FEN/GI: heart healthy Prophylaxis: SCDs  Disposition: Pending Psych capacity eval  Subjective:  Feels pretty good. Continues to have some slight epigastric discomfort. Very grateful for our care. Denies any symptoms at this time. Ready for DC  Objective: Temp:  [97.5 F (36.4 C)-98.5 F (36.9 C)] 98.5 F (36.9 C) (03/10 0523) Pulse Rate:  [71-73] 71 (03/09 2205) Resp:  [16-18] 16 (03/10 0523) BP: (114-159)/(55-72) 133/57 mmHg (03/10 1052) SpO2:  [99 %-100 %] 99 % (03/10 0523) Physical Exam: General: NAD, AAOx3  HEENT: NCAT, Discheveled, PERRLA, No LAD Cardiovascular: RRR, Occasional PVC's, no MRG, 2+ distal pulses Respiratory: CTAB, Appropriate rate, unlabored.  Abdomen: S, ND, Mild TTP over epigastrum, all other quadrants nontender, +BS, No organomegaly.  Extremities: WWP, no peripheral edema Skin: no rashes, no lesions.  Neuro: AAOx3, no focal deficits.   Laboratory:  Recent Labs Lab 04/19/14 0829 04/20/14 0733 04/21/14 0717  WBC 9.5 7.7 7.5  HGB 9.9* 10.4* 10.0*  HCT 30.7* 33.2* 31.4*  PLT 274 295 280    Recent Labs Lab 04/17/14 0915  04/19/14 0829 04/20/14 0733 04/21/14 0717  NA 139  < > 136 138 136  K 3.8  < > 3.7 4.0 3.8  CL 103  < > 105 107  105  CO2 25  < > 25 24 28   BUN 26*  < > 8 7 9   CREATININE 1.08  < > 1.09 1.11 0.98  CALCIUM 8.9  < > 8.3* 8.5 8.5  PROT 6.8  --   --   --   --   BILITOT 0.5  --   --   --   --   ALKPHOS 109  --   --   --   --   ALT 10  --   --   --   --   AST 19  --   --   --   --   GLUCOSE 126*  < > 92 92 91  < > = values in this interval not displayed.   POC Gastric Occult Blood -- Positive Lipase 36 (N)  Imaging/Diagnostic Tests: CT Abd/Pelvis 3/6 IMPRESSION: 1. Large amount of stool within the rectum,  suggesting fecal impaction. No other explanation for chronic abdominal pain. 2. Small hiatal hernia with distal esophageal wall thickening and new or increased edema surrounding the distal esophagus. Contiguous edema versus fluid seen adjacent to a markedly atherosclerotic irregular lower thoracic aorta. Favor the process secondary to esophagitis. Consider correlation with endoscopy to exclude ulcer or malignancy. Concurrent vascular pathology secondary to vasculitis or even small volume periaortic hemorrhage is felt less likely. 3. Advanced atherosclerosis with aortic and left common iliac artery ectasia. 4. Cholelithiasis. 5. Centrilobular emphysema with basilar mild pulmonary fibrosis.  ENDOSCOPIC IMPRESSION: 3/9 1. 12 sessile and pedunculated polyps ranging from 5 to 51mm in size were found in the transverse colon, ascending colon, sigmoid colon, and descending colon; polypectomies were performed using snare cautery and with a cold snare 2. Moderate diverticulosis was noted in the left colon  ENDOSCOPIC IMPRESSION: 3/9 1. There was severe esophagitis noted 2. Single ulcer concerning for neoplasm was found in the lower third of the esophagus; biopsies were taken 3. 4 cm hiatal hernia 4. The stomach otherwise appeared normal; biopsies taken from the body and antrum of the stomach 5. Duodenal inflammation was found in the duodenal bulb 6. The duodenal mucosa showed no abnormalities in the 2nd part of the duodenum   Dale Leatherwood, MD 04/21/2014, 11:52 AM PGY-1, [place] Intern pager: [phone], text pages welcome

## 2014-04-21 NOTE — Progress Notes (Signed)
          Daily Rounding Note  04/21/2014, 11:39 AM  LOS: 4 days   SUBJECTIVE:       Feels well. Happy to stay put in the hospital.  OBJECTIVE:         Vital signs in last 24 hours:    Temp:  [97.5 F (36.4 C)-98.5 F (36.9 C)] 98.5 F (36.9 C) (03/10 0523) Pulse Rate:  [71-73] 71 (03/09 2205) Resp:  [16-18] 16 (03/10 0523) BP: (112-159)/(48-96) 133/57 mmHg (03/10 1052) SpO2:  [99 %-100 %] 99 % (03/10 0523) Last BM Date: 04/20/14 Filed Weights   04/17/14 1341  Weight: 126 lb 4.8 oz (57.289 kg)   General: Looks well though a bit thin. Comfortable   Heart: RRR. Chest: Clear bilaterally. No respiratory distress Abdomen: Soft, NT, ND.  Extremities: no CCE.  Wearing his outdoor boots in bed Neuro/Psych:  Pleasant, oriented x 3.  Appropriate, relaxed.   Intake/Output from previous day: 03/09 0701 - 03/10 0700 In: 1370 [P.O.:720; I.V.:650] Out: 1125 [Urine:1125]  Intake/Output this shift: Total I/O In: 240 [P.O.:240] Out: -   Lab Results:  Recent Labs  04/19/14 0829 04/20/14 0733 04/21/14 0717  WBC 9.5 7.7 7.5  HGB 9.9* 10.4* 10.0*  HCT 30.7* 33.2* 31.4*  PLT 274 295 280   BMET  Recent Labs  04/19/14 0829 04/20/14 0733 04/21/14 0717  NA 136 138 136  K 3.7 4.0 3.8  CL 105 107 105  CO2 25 24 28   GLUCOSE 92 92 91  BUN 8 7 9   CREATININE 1.09 1.11 0.98  CALCIUM 8.3* 8.5 8.5   LFT No results for input(s): PROT, ALBUMIN, AST, ALT, ALKPHOS, BILITOT, BILIDIR, IBILI in the last 72 hours. PT/INR No results for input(s): LABPROT, INR in the last 72 hours. Hepatitis Panel No results for input(s): HEPBSAG, HCVAB, HEPAIGM, HEPBIGM in the last 72 hours.  Studies/Results: No results found.  Scheduled Meds: . amLODipine  5 mg Oral Daily  . folic acid  1 mg Oral Daily  . multivitamin with minerals  1 tablet Oral Daily  . pantoprazole  40 mg Oral BID AC  . sodium chloride  3 mL Intravenous Q12H  . thiamine   100 mg Oral Daily   Continuous Infusions: . lactated ringers 100 mL/hr (04/20/14 0933)   PRN Meds:.fentaNYL, midazolam, ondansetron **OR** ondansetron (ZOFRAN) IV  ASSESMENT:   * Hematemesis in a homeless alcoholic, resolved. CT showing changes in distal esophagus. Rule out esophagitis, r/o neoplasia.  EGD 04/20/14.  Severe esophagitis. Solitary ulcer within the distal esophagus and in the area of severe esophagitis. This was biopsied. 4 cm hiatal hernia. Bulbar duodenitis.  Had not been compliant with PPI therapy. Has been restarted on PPI as of this admission.  * History of numerous adenomatous colon polyps 01/2012. Colonoscopy 04/20/14 with polypectomy of 12 sessile and pedunculated polyps of the transverse, ascending, sigmoid and descending colon. Left-sided diverticulosis.  * Normocytic anemia. Hemoglobin relatively stable  * Uncomplicated gallstones. LFTs normal.   * Atherosclerosis of aorta and branch vessels, left common iliac.   * Emphysema.   * Hx Afib. Not AC candidate due to non-compliance.     PLAN   *  Awaiting biopsy of the esoph ulcer.     Azucena Freed  04/21/2014, 11:39 AM Pager: [phone]

## 2014-04-21 NOTE — Discharge Instructions (Signed)
Bloody Stools  Bloody stools often mean that there is a problem in the digestive tract. Your caregiver may use the term "melena" to describe black, tarry, and bad smelling stools or "hematochezia" to describe red or maroon-colored stools. Blood seen in the stool can be caused by bleeding anywhere along the intestinal tract.   A black stool usually means that blood is coming from the upper part of the gastrointestinal tract (esophagus, stomach, or small bowel). Passing maroon-colored stools or bright red blood usually means that blood is coming from lower down in the large bowel or the rectum. However, sometimes massive bleeding in the stomach or small intestine can cause bright red bloody stools.   Consuming black licorice, lead, iron pills, medicines containing bismuth subsalicylate, or blueberries can also cause black stools. Your caregiver can test black stools to see if blood is present.  It is important that the cause of the bleeding be found. Treatment can then be started, and the problem can be corrected. Rectal bleeding may not be serious, but you should not assume everything is okay until you know the cause. It is very important to follow up with your caregiver or a specialist in gastrointestinal problems.  CAUSES   Blood in the stools can come from various underlying causes. Often, the cause is not found during your first visit. Testing is often needed to discover the cause of bleeding in the gastrointestinal tract. Causes range from simple to serious or even life-threatening. Possible causes include:  · Hemorrhoids. These are veins that are full of blood (engorged) in the rectum. They cause pain, inflammation, and may bleed.  · Anal fissures. These are areas of painful tearing which may bleed. They are often caused by passing hard stool.  · Diverticulosis. These are pouches that form on the colon over time, with age, and may bleed significantly.  · Diverticulitis. This is inflammation in areas with  diverticulosis. It can cause pain, fever, and bloody stools, although bleeding is rare.  · Proctitis and colitis. These are inflamed areas of the rectum or colon. They may cause pain, fever, and bloody stools.  · Polyps and cancer. Colon cancer is a leading cause of preventable cancer death. It often starts out as precancerous polyps that can be removed during a colonoscopy, preventing progression into cancer. Sometimes, polyps and cancer may cause rectal bleeding.  · Gastritis and ulcers. Bleeding from the upper gastrointestinal tract (near the stomach) may travel through the intestines and produce black, sometimes tarry, often bad smelling stools. In certain cases, if the bleeding is fast enough, the stools may not be black, but red and the condition may be life-threatening.  SYMPTOMS   You may have stools that are bright red and bloody, that are normal color with blood on them, or that are dark black and tarry. In some cases, you may only have blood in the toilet bowl. Any of these cases need medical care. You may also have:  · Pain at the anus or anywhere in the rectum.  · Lightheadedness or feeling faint.  · Extreme weakness.  · Nausea or vomiting.  · Fever.  DIAGNOSIS  Your caregiver may use the following methods to find the cause of your bleeding:  · Taking a medical history. Age is important. Older people tend to develop polyps and cancer more often. If there is anal pain and a hard, large stool associated with bleeding, a tear of the anus may be the cause. If blood drips into the toilet after a bowel movement, bleeding hemorrhoids may be the   problem. The color and frequency of the bleeding are additional considerations. In most cases, the medical history provides clues, but seldom the final answer.  · A visual and finger (digital) exam. Your caregiver will inspect the anal area, looking for tears and hemorrhoids. A finger exam can provide information when there is tenderness or a growth inside. In men, the  prostate is also examined.  · Endoscopy. Several types of small, long scopes (endoscopes) are used to view the colon.  ¨ In the office, your caregiver may use a rigid, or more commonly, a flexible viewing sigmoidoscope. This exam is called flexible sigmoidoscopy. It is performed in 5 to 10 minutes.  ¨ A more thorough exam is accomplished with a colonoscope. It allows your caregiver to view the entire 5 to 6 foot long colon. Medicine to help you relax (sedative) is usually given for this exam. Frequently, a bleeding lesion may be present beyond the reach of the sigmoidoscope. So, a colonoscopy may be the best exam to start with. Both exams are usually done on an outpatient basis. This means the patient does not stay overnight in the hospital or surgery center.  ¨ An upper endoscopy may be needed to examine your stomach. Sedation is used and a flexible endoscope is put in your mouth, down to your stomach.  · A barium enema X-ray. This is an X-ray exam. It uses liquid barium inserted by enema into the rectum. This test alone may not identify an actual bleeding point. X-rays highlight abnormal shadows, such as those made by lumps (tumors), diverticuli, or colitis.  TREATMENT   Treatment depends on the cause of your bleeding.   · For bleeding from the stomach or colon, the caregiver doing your endoscopy or colonoscopy may be able to stop the bleeding as part of the procedure.  · Inflammation or infection of the colon can be treated with medicines.  · Many rectal problems can be treated with creams, suppositories, or warm baths.  · Surgery is sometimes needed.  · Blood transfusions are sometimes needed if you have lost a lot of blood.  · For any bleeding problem, let your caregiver know if you take aspirin or other blood thinners regularly.  HOME CARE INSTRUCTIONS   · Take any medicines exactly as prescribed.  · Keep your stools soft by eating a diet high in fiber. Prunes (1 to 3 a day) work well for many people.  · Drink  enough water and fluids to keep your urine clear or pale yellow.  · Take sitz baths if advised. A sitz bath is when you sit in a bathtub with warm water for 10 to 15 minutes to soak, soothe, and cleanse the rectal area.  · If enemas or suppositories are advised, be sure you know how to use them. Tell your caregiver if you have problems with this.  · Monitor your bowel movements to look for signs of improvement or worsening.  SEEK MEDICAL CARE IF:   · You do not improve in the time expected.  · Your condition worsens after initial improvement.  · You develop any new symptoms.  SEEK IMMEDIATE MEDICAL CARE IF:   · You develop severe or prolonged rectal bleeding.  · You vomit blood.  · You feel weak or faint.  · You have a fever.  MAKE SURE YOU:  · Understand these instructions.  · Will watch your condition.  · Will get help right away if you are not doing well or get worse.    Document Released: 01/18/2002 Document Revised: 04/22/2011 Document Reviewed: 06/15/2010  [employer]® Patient Information ©2015 [employer]. This information is not intended to replace advice given to you by your health care provider. Make sure you discuss any questions you have with your health care provider.
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
## 2014-04-22 LAB — BASIC METABOLIC PANEL
Anion gap: 8 (ref 5–15)
BUN: 12 mg/dL (ref 6–23)
CO2: 27 mmol/L (ref 19–32)
Calcium: 8.6 mg/dL (ref 8.4–10.5)
Chloride: 104 mmol/L (ref 96–112)
Creatinine, Ser: 1.21 mg/dL (ref 0.50–1.35)
GFR calc non Af Amer: 54 mL/min — ABNORMAL LOW (ref >90)
GFR, EST AFRICAN AMERICAN: 63 mL/min — AB (ref >90)
GLUCOSE: 96 mg/dL (ref 70–99)
POTASSIUM: 4.4 mmol/L (ref 3.5–5.1)
SODIUM: 139 mmol/L (ref 135–145)

## 2014-04-22 LAB — CBC WITH DIFFERENTIAL/PLATELET
BASOS ABS: 0.1 10*3/uL (ref 0.0–0.1)
Basophils Relative: 1 % (ref 0–1)
EOS PCT: 4 % (ref 0–5)
Eosinophils Absolute: 0.3 10*3/uL (ref 0.0–0.7)
HEMATOCRIT: 32.3 % — AB (ref 39.0–52.0)
HEMOGLOBIN: 10.1 g/dL — AB (ref 13.0–17.0)
Lymphocytes Relative: 23 % (ref 12–46)
Lymphs Abs: 1.8 10*3/uL (ref 0.7–4.0)
MCH: 25.8 pg — AB (ref 26.0–34.0)
MCHC: 31.3 g/dL (ref 30.0–36.0)
MCV: 82.4 fL (ref 78.0–100.0)
MONOS PCT: 9 % (ref 3–12)
Monocytes Absolute: 0.7 10*3/uL (ref 0.1–1.0)
Neutro Abs: 4.8 10*3/uL (ref 1.7–7.7)
Neutrophils Relative %: 63 % (ref 43–77)
Platelets: 330 10*3/uL (ref 150–400)
RBC: 3.92 MIL/uL — ABNORMAL LOW (ref 4.22–5.81)
RDW: 15.6 % — AB (ref 11.5–15.5)
WBC: 7.7 10*3/uL (ref 4.0–10.5)

## 2014-04-22 NOTE — Clinical Social Work Note (Signed)
Per RN patient discharging today. CSW has notified APS social worker of patient's discharge and discharge plan. CSW provided patient with bus passes. CSW signing off at this time.   Liz Beach MSW, [place], [place], [phone]

## 2014-04-22 NOTE — Progress Notes (Signed)
          Daily Rounding Note  04/22/2014, 3:35 PM  LOS: 5 days   SUBJECTIVE:       Pt discharged before GI could round on pt.   OBJECTIVE:         Vital signs in last 24 hours:    Temp:  [97.5 F (36.4 C)-98.2 F (36.8 C)] 98.2 F (36.8 C) (03/11 0501) Pulse Rate:  [75-78] 75 (03/11 0501) Resp:  [18] 18 (03/11 0501) BP: (119-124)/(65-95) 124/65 mmHg (03/11 0957) SpO2:  [98 %] 98 % (03/11 0501) Weight:  [121 lb (54.885 kg)] 121 lb (54.885 kg) (03/11 0100) Last BM Date: 04/21/14 Filed Weights   04/17/14 1341 04/22/14 0100  Weight: 126 lb 4.8 oz (57.289 kg) 121 lb (54.885 kg)   Pt not seen, had left hospital prior to rounding.   Intake/Output from previous day: 03/10 0701 - 03/11 0700 In: 1320 [P.O.:1320] Out: 1000 [Urine:1000]  Intake/Output this shift: Total I/O In: 240 [P.O.:240] Out: 300 [Urine:300]  Lab Results:  Recent Labs  04/20/14 0733 04/21/14 0717 04/22/14 0524  WBC 7.7 7.5 7.7  HGB 10.4* 10.0* 10.1*  HCT 33.2* 31.4* 32.3*  PLT 295 280 330   BMET  Recent Labs  04/20/14 0733 04/21/14 0717 04/22/14 0524  NA 138 136 139  K 4.0 3.8 4.4  CL 107 105 104  CO2 24 28 27   GLUCOSE 92 91 96  BUN 7 9 12   CREATININE 1.11 0.98 1.21  CALCIUM 8.5 8.5 8.6   Surgical Pathology: Not signed out but unofficial report: no cancer in esophageal biopsy.     ASSESMENT:   * Hematemesis resolved with resumption of PPI and absence of ETOH.  Hx severe esophagitis on 01/2011 EGD  CT showing changes in distal esophagus.  Had not been compliant with PPI.  EGD 04/20/14. Severe esophagitis. Solitary ulcer within the distal esophagus and in the area of severe esophagitis. This was biopsied.  4 cm hiatal hernia. Bulbar duodenitis.  Path: unofficial report is that there is no cancer.  Waiting on signed, official report. Regardless of findings, needs follow up EGD in 6 weeks or so.    * History of numerous adenomatous  colon polyps 01/2012. Colonoscopy 04/20/14 with polypectomy of 12 sessile and pedunculated polyps of the transverse, ascending, sigmoid and descending colon. Left-sided diverticulosis.  * Normocytic anemia. Hemoglobin relatively stable  * Uncomplicated gallstones. LFTs normal.   * Atherosclerosis of aorta and branch vessels, left common iliac.   * Emphysema.   * Hx Afib. Not AC candidate due to non-compliance.     PLAN   *  Pt's discharge summary states: pt to call Dr Vena Rua office in 2 weeks to arrange appt and meds include Protonix 40 mg BID.  Hopefully he will be compliant and not lost to follow up.  Has appt with resident Dr Alease Frame on 3/22.      Dale Mcfarland  04/22/2014, 3:35 PM Pager: [phone]

## 2014-04-25 ENCOUNTER — Telehealth: Payer: Self-pay | Admitting: *Deleted

## 2014-04-25 NOTE — Telephone Encounter (Signed)
Received a call from [facility] requesting a call when pt come in for appt 05/03/2014 with Dr. Alease Frame.  Dr. Hilarie Fredrickson would like to repeat an endoscopy in 8-12 weeks.  There is not a number or address listed for patient for them to contact him.  Derl Barrow, RN

## 2014-04-29 ENCOUNTER — Emergency Department (HOSPITAL_COMMUNITY)
Admission: EM | Admit: 2014-04-29 | Discharge: 2014-04-29 | Disposition: A | Discharge status: Home / Self Care | Payer: Non-veteran care | Attending: Emergency Medicine | Admitting: Emergency Medicine

## 2014-04-29 ENCOUNTER — Encounter (HOSPITAL_COMMUNITY): Payer: Self-pay | Admitting: Emergency Medicine

## 2014-04-29 DIAGNOSIS — M545 Low back pain: Secondary | ICD-10-CM | POA: Insufficient documentation

## 2014-04-29 DIAGNOSIS — Z8719 Personal history of other diseases of the digestive system: Secondary | ICD-10-CM | POA: Insufficient documentation

## 2014-04-29 DIAGNOSIS — G8929 Other chronic pain: Secondary | ICD-10-CM | POA: Insufficient documentation

## 2014-04-29 DIAGNOSIS — J449 Chronic obstructive pulmonary disease, unspecified: Secondary | ICD-10-CM | POA: Insufficient documentation

## 2014-04-29 DIAGNOSIS — Z720 Tobacco use: Secondary | ICD-10-CM | POA: Insufficient documentation

## 2014-04-29 DIAGNOSIS — F329 Major depressive disorder, single episode, unspecified: Secondary | ICD-10-CM | POA: Insufficient documentation

## 2014-04-29 DIAGNOSIS — E119 Type 2 diabetes mellitus without complications: Secondary | ICD-10-CM | POA: Insufficient documentation

## 2014-04-29 DIAGNOSIS — Z862 Personal history of diseases of the blood and blood-forming organs and certain disorders involving the immune mechanism: Secondary | ICD-10-CM | POA: Insufficient documentation

## 2014-04-29 DIAGNOSIS — Z8701 Personal history of pneumonia (recurrent): Secondary | ICD-10-CM | POA: Insufficient documentation

## 2014-04-29 DIAGNOSIS — Z8781 Personal history of (healed) traumatic fracture: Secondary | ICD-10-CM | POA: Insufficient documentation

## 2014-04-29 DIAGNOSIS — I10 Essential (primary) hypertension: Secondary | ICD-10-CM | POA: Insufficient documentation

## 2014-04-29 MED ORDER — TRAMADOL HCL 50 MG PO TABS: 50.0000 mg | ORAL_TABLET | Freq: Four times a day (QID) | ORAL | Status: DC | PRN

## 2014-04-29 NOTE — Discharge Instructions (Signed)
Back Pain: ° ° °Your back pain should be treated with medicines such as ibuprofen or aleve and this back pain should get better over the next 2 weeks.  However if you develop severe or worsening pain, low back pain with fever, numbness, weakness or inability to walk or urinate, you should return to the ER immediately.  Please follow up with your doctor this week for a recheck if still having symptoms. °Low back pain is discomfort in the lower back that may be due to injuries to muscles and ligaments around the spine.  Occasionally, it may be caused by a a problem to a part of the spine called a disc.  The pain may last several days or a week;  However, most patients get completely well in 4 weeks. ° °Self - care:  The application of heat can help soothe the pain.  Maintaining your daily activities, including walking, is encourged, as it will help you get better faster than just staying in bed. ° °Medications are also useful to help with pain control.  A commonly prescribed medications includes acetaminophen.  This medication is generally safe, though you should not take more than 8 of the extra strength (500mg) pills a day. ° °Non steroidal anti inflammatory medications including Ibuprofen and naproxen;  These medications help both pain and swelling and are very useful in treating back pain.  They should be taken with food, as they can cause stomach upset, and more seriously, stomach bleeding.   ° °Muscle relaxants:  These medications can help with muscle tightness that is a cause of lower back pain.  Most of these medications can cause drowsiness, and it is not safe to drive or use dangerous machinery while taking them. ° °You will need to follow up with  Your primary healthcare provider in 1-2 weeks for reassessment. ° °Be aware that if you develop new symptoms, such as a fever, leg weakness, difficulty with or loss of control of your urine or bowels, abdominal pain, or more severe pain, you will need to seek  medical attention and  / or return to the Emergency department. ° °If you do not have a doctor see the list below. ° °RESOURCE GUIDE ° °Chronic Pain Problems: °Contact [facility]  [phone] °Patients need to be referred by their primary care doctor. ° °Insufficient Money for Medicine: °Contact United Way:  call "211" or [facility] 271-5999. ° °No Primary Care Doctor: °- Call [facility]  832-8000 - can help you locate a primary care doctor that  accepts your insurance, provides certain services, etc. °- Physician Referral Service- 1-800-533-3463 ° °Agencies that provide inexpensive medical care: °- [facility]  [phone] °- [facility]  [phone] °- [facility]  [phone] °- [facility]  832-4777 °- [facility]  [phone] °- [facility]  [phone] ° °Medicaid-accepting [place] Providers: °- [facility]- [address] ° [phone], Mon-Fri 9am-7pm, Sat 9am-1pm °- [facility]- [address], [address] ° [phone] °- [facility]- [address], [address] ° [phone] °- [facility]- [address] [place] [address] ° [phone] °- [facility]- [address], [address], [phone] ° Only accepts [place] Access Medicaid patients after they have their name  applied to their card ° °Self Pay (no insurance) in [place]: °- Sickle Cell Patients: Dr Eric Dean, [facility] °   [address], [phone] °- [facility]- [address] ° [phone] °      -     [facility]- [address] °      -     [facility]- see information above (Speak to Pam H if you do not have insurance) °      -  [facility]- [address], [phone] °      -  [facility] [place]- [address],  [phone] °      -  [facility]- [address] [place] [address],  [phone] °      -  Dr Osei-Bonsu-  [address], [place], [phone] °      -  [facility]- [address], [phone] °      -  [facility]- [address] [place] [address], [phone], also [address], [phone] °      -    [facility]- [address], [phone], 1st & 3rd Saturday   every month, 10am-1pm ° °1) Find a Doctor and Pay Out of Pocket °Although you won't have to find out who is covered by your insurance plan, it is a good idea to ask around and get recommendations. You will then need to call the office and see if the doctor you have chosen will accept you as a new patient and what types of options they offer for patients who are self-pay. Some doctors offer discounts or will set up payment plans for their patients who do not have insurance, but you will need to ask so you aren't surprised when you get to your appointment. ° °2) Contact Your Local Health Department °Not all health departments have doctors that can see patients for sick visits, but many do, so it is worth a call to see if yours does. If you don't know where your local health department is, you can check in your phone book. The CDC also has a tool to help you locate your state's health department, and many state websites also have listings of all of their local health departments. ° °3) Find a Walk-in Clinic °If your illness is not likely to be very severe or complicated, you may want to try a walk in clinic. These are popping up all over the country in pharmacies, drugstores, and shopping centers. They're usually staffed by nurse practitioners or physician assistants that have been trained to treat common illnesses and complaints. They're usually fairly quick and inexpensive. However, if you have serious medical issues or chronic medical problems, these are probably not your best option ° °STD Testing °- [place] [facility] [place], [facility], [address], [place], phone [phone] or 1-877-539-9860.  Monday - Friday, call for an appointment. °- [place] [facility] [place], [facility], [address], [place], phone [phone] or 1-877-539-9860.  Monday - Friday, call for an appointment. ° °Abuse/Neglect: °- [place] Child Abuse Hotline (336) 641-3795 °- [place] Child Abuse Hotline 800-378-5315 (After Hours) ° °Emergency Shelter:  [facility] (336) 271-5985 ° °Maternity Homes: °- [facility] (336) 275-9566 °- [facility] (704) 372-4663 ° °MRSA Hotline #:   832-7006 ° °[place] Resources ° °[facility] [place]  [employer] [place] [facility] °[address]                 [address]         [address]  °[place]                                               [place]                              [place] °Phone:  349-3220                                  Phone:  [phone]                   Phone:  342-8140 ° °[facility], 342-8316 °- [place] [facility] - [facility]- 1-888-581-9988 °      -     [facility] in , [address],                                  336-349-4454, Insurance ° °[place] Child Abuse Hotline °(336) 342-1394 or (336) 342-3537 (After Hours) ° ° °Behavioral Health Services ° °Substance Abuse Resources: °- [facility]  336-882-2125 °- [facility] 336-784-9470 °- [facility] 336-285-9073 °- [facility] 336-845-3988 °- Residential & Outpatient Substance Abuse Program  800-659-3381 ° °Psychological Services: °- [facility]  832-9600 °- [facility]  378-7881 °- [facility], [address], [place], ACCESS LINE: 1-800-853-5163 or 336-641-4981, Http://www.[facility].com/services/adult.htm ° °Dental Assistance ° °If unable to pay or  uninsured, contact:  [facility] or [place] [facility] to become qualified for the adult dental clinic. ° °Patients with Medicaid: [facility] °[address], [phone] °[address], [phone] ° °If unable to pay, or uninsured, contact [facility] (271-5999) or [place] [facility] (641-3152 in [place], [phone] in [place]) to become qualified for the adult dental clinic ° °Other Low-Cost Community Dental Services: °- [facility]- [address], [place], [place], [address], 723-1848, Ext. [address], 2nd and 4th Thursday of the month at 6:30am.  10 clients each day by appointment, can sometimes see walk-in patients if someone does not show for an appointment. °- [facility]- [address], [place], [place], [address], [phone] °- [facility]- [address], [place], , [address], [phone] °- [place] [facility]- 342-8273 °- [facility]- [phone] °- [facility]- 570-6415 ° ° ° ° ° ° °
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
## 2014-04-29 NOTE — Progress Notes (Signed)
CSW met with pt at bedside. There was no family present. Per note, patient presents to [facility] due to back, right leg and right hip pain.  Patient informed CSW that he is homeless. CSW offered patient resources regarding food and shelter. However, the patient declined. Patient stated " I don't go to shelters. 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  CSW also asked pt if he would be interested in resources regarding food pantries. However, the patient declined.   Willette Brace [phone] ED CSW 04/29/2014 6:22 PM

## 2014-04-29 NOTE — ED Provider Notes (Signed)
CSN: [id number]     Arrival date & time 04/29/14  1548 History   First MD Initiated Contact with Patient 04/29/14 1559     Chief Complaint  Patient presents with  . Back Pain    right, lower  . Hip Pain    right  . Leg Pain    right     (Consider location/radiation/quality/duration/timing/severity/associated sxs/prior Treatment) HPI Comments: The patient is an 81 year old male, he has a history of chronic back pain, L2 compression fracture, heavy alcohol use with subsequent esophagitis, stomach ulcer and multiple colon polyps which were seen on March 9 colonoscopy and endoscopy. He was encouraged to take a proton pump inhibitor, he has not been taking any of these medications. He reports today that he is having some back pain, it is lower back, associated with some right thigh pain, these are not new findings, he has had these for some time, worse when he uses the muscles. No numbness weakness difficulty with urinating or incontinence or retention, no other risk factors except for age for pathology to the lumbar spine, again these are not new findings, he has had this back pain multiple times in the past.  Patient is a 81 y.o. male presenting with back pain, hip pain, and leg pain. The history is provided by the patient.  Back Pain Associated symptoms: leg pain   Hip Pain  Leg Pain Associated symptoms: back pain     Past Medical History  Diagnosis Date  . COPD (chronic obstructive pulmonary disease)   . Chronic back pain   . Pneumonia     01/2011 - CAP vs aspiration pneuonmia  . Depression   . PONV (postoperative nausea and vomiting)   . Hyponatremia     Previously felt secondary to SIADH  . Hypertension   . Upper GI bleed     01/2011 with EGD showing severe candida esophagitis and duodenal bulb erosion  . Anemia     In the setting of UGI bleed 01/2011 requiring blood transfusion  . Homelessness   . Dyspnea     Chronic, thought due to COPD. Echo 02/2010 with EF 30-35%,  nuclear study negative, then repeat echo 03/2011 did not show systolic dysfxn, so unclear if truly HF  . Bronchitis   . Diabetes mellitus without complication   . Collar bone fracture     left   Past Surgical History  Procedure Laterality Date  . Tonsillectomy    . Vasectomy    . Appendectomy    . Tonsillectomy    . Colonoscopy    . Esophagogastroduodenoscopy    . Esophagogastroduodenoscopy  02/03/2011    Procedure: ESOPHAGOGASTRODUODENOSCOPY (EGD);  Surgeon: Juanita Craver, MD;  Location: [facility] ENDOSCOPY;  Service: Endoscopy;  Laterality: N/A;  . Colonoscopy  01/30/2012    Procedure: COLONOSCOPY;  Surgeon: Ladene Artist, MD,FACG;  Location: [facility] ENDOSCOPY;  Service: Endoscopy;  Laterality: N/A;  . Colonoscopy N/A 04/20/2014    Procedure: COLONOSCOPY;  Surgeon: Jerene Bears, MD;  Location: [facility] ENDOSCOPY;  Service: Endoscopy;  Laterality: N/A;  . Esophagogastroduodenoscopy N/A 04/20/2014    Procedure: ESOPHAGOGASTRODUODENOSCOPY (EGD);  Surgeon: Jerene Bears, MD;  Location: [facility] ENDOSCOPY;  Service: Endoscopy;  Laterality: N/A;   Family History  Problem Relation Age of Onset  . Coronary artery disease Father     Starting in his 35's. Died of massive MI at age 69.  . Schizophrenia Brother   . Coronary artery disease Sister     MI at age 41  .  Alzheimer's disease Mother    History  Substance Use Topics  . Smoking status: Current Some Day Smoker -- 1.00 packs/day for 60 years    Types: Cigarettes, Cigars  . Smokeless tobacco: Never Used     Comment: Since age 49  . Alcohol Use: Yes     Comment: 174 ml of scotch weekly ---- "I only drink by the drink and speicial occasion/celebrate." 04/17/14    Review of Systems  Musculoskeletal: Positive for back pain.  All other systems reviewed and are negative.     Allergies  Benadryl and Penicillins  Home Medications   Prior to Admission medications   Medication Sig Start Date End Date Taking? Authorizing Provider  amLODipine (NORVASC) 5 MG  tablet Take 1 tablet (5 mg total) by mouth daily. Patient not taking: Reported on 02/27/2014 01/13/14   Debbe Odea, MD  folic acid (FOLVITE) 1 MG tablet Take 1 tablet (1 mg total) by mouth daily. Patient not taking: Reported on 02/27/2014 01/13/14   Debbe Odea, MD  guaiFENesin (MUCINEX) 600 MG 12 hr tablet Take 1 tablet (600 mg total) by mouth 2 (two) times daily as needed for to loosen phlegm. Patient not taking: Reported on 02/27/2014 01/13/14   Debbe Odea, MD  HYDROcodone-acetaminophen (NORCO/VICODIN) 5-325 MG per tablet Take 1-2 tablets by mouth every 4 (four) hours as needed for moderate pain. Patient not taking: Reported on 02/27/2014 02/21/14   Theodis Blaze, MD  ondansetron (ZOFRAN) 4 MG tablet Take 1 tablet (4 mg total) by mouth every 6 (six) hours as needed for nausea or vomiting. Patient not taking: Reported on 03/10/2014 7/42/59   Delora Fuel, MD  pantoprazole (PROTONIX) 40 MG tablet Take 1 tablet (40 mg total) by mouth 2 (two) times daily before a meal. Patient not taking: Reported on 04/29/2014 04/21/14   Elberta Leatherwood, MD  thiamine 100 MG tablet Take 1 tablet (100 mg total) by mouth daily. Patient not taking: Reported on 02/27/2014 01/13/14   Debbe Odea, MD  traMADol (ULTRAM) 50 MG tablet Take 1 tablet (50 mg total) by mouth every 6 (six) hours as needed. 04/29/14   Noemi Chapel, MD   BP 119/65 mmHg  Pulse 85  Temp(Src) 98.6 F (37 C) (Oral)  Resp 17  SpO2 97% Physical Exam  Constitutional: He appears well-developed and well-nourished. No distress.  HENT:  Head: Normocephalic and atraumatic.  Mouth/Throat: Oropharynx is clear and moist. No oropharyngeal exudate.  Eyes: Conjunctivae and EOM are normal. Pupils are equal, round, and reactive to light. Right eye exhibits no discharge. Left eye exhibits no discharge. No scleral icterus.  Neck: Normal range of motion. Neck supple. No JVD present. No thyromegaly present.  Cardiovascular: Normal rate, regular rhythm, normal heart sounds  and intact distal pulses.  Exam reveals no gallop and no friction rub.   No murmur heard. Pulmonary/Chest: Effort normal and breath sounds normal. No respiratory distress. He has no wheezes. He has no rales.  Abdominal: Soft. Bowel sounds are normal. He exhibits no distension and no mass. There is no tenderness.  No pulsating masses or abdominal tenderness  Musculoskeletal: Normal range of motion. He exhibits tenderness (mild reproducible tenderness to the lumbar area including paraspinal and mid lumbar area.). He exhibits no edema.  Lymphadenopathy:    He has no cervical adenopathy.  Neurological: He is alert. Coordination normal.  Able to straight leg raise bilaterally with normal strength, sensation, decreased reflexes but are symmetrical at the knees bilaterally  Skin: Skin is warm  and dry. No rash noted. No erythema.  Psychiatric: He has a normal mood and affect. His behavior is normal.  Nursing note and vitals reviewed.   ED Course  Procedures (including critical care time) Labs Review Labs Reviewed - No data to display  Imaging Review No results found.    MDM   Final diagnoses:  Low back pain without sciatica, unspecified back pain laterality    The patient is in no distress, vital signs are totally normal, denies any ongoing GI bleeding, can be treated outpatient with tramadol, we'll avoid NSAIDs given the patient's history of stomach ulcers and esophagitis. No indication for imaging at this time.  Meds given in ED:  Medications - No data to display  New Prescriptions   TRAMADOL (ULTRAM) 50 MG TABLET    Take 1 tablet (50 mg total) by mouth every 6 (six) hours as needed.       Noemi Chapel, MD 04/29/14 [phone]

## 2014-05-03 ENCOUNTER — Inpatient Hospital Stay: Payer: Self-pay | Admitting: Family Medicine
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
## 2015-04-27 ENCOUNTER — Emergency Department (HOSPITAL_COMMUNITY): Payer: Medicare Other

## 2015-04-27 ENCOUNTER — Encounter (HOSPITAL_COMMUNITY): Payer: Self-pay | Admitting: Emergency Medicine

## 2015-04-27 ENCOUNTER — Emergency Department (HOSPITAL_COMMUNITY)
Admission: EM | Admit: 2015-04-27 | Discharge: 2015-04-28 | Disposition: A | Discharge status: Home / Self Care | Payer: Medicare Other | Attending: Emergency Medicine | Admitting: Emergency Medicine

## 2015-04-27 DIAGNOSIS — Z880 Allergy status to penicillin: Secondary | ICD-10-CM | POA: Diagnosis not present

## 2015-04-27 DIAGNOSIS — D649 Anemia, unspecified: Secondary | ICD-10-CM | POA: Diagnosis not present

## 2015-04-27 DIAGNOSIS — Z8781 Personal history of (healed) traumatic fracture: Secondary | ICD-10-CM | POA: Diagnosis not present

## 2015-04-27 DIAGNOSIS — J449 Chronic obstructive pulmonary disease, unspecified: Secondary | ICD-10-CM | POA: Insufficient documentation

## 2015-04-27 DIAGNOSIS — R627 Adult failure to thrive: Secondary | ICD-10-CM | POA: Diagnosis present

## 2015-04-27 DIAGNOSIS — Z590 Homelessness: Secondary | ICD-10-CM | POA: Insufficient documentation

## 2015-04-27 DIAGNOSIS — R630 Anorexia: Secondary | ICD-10-CM | POA: Diagnosis not present

## 2015-04-27 DIAGNOSIS — R05 Cough: Secondary | ICD-10-CM | POA: Diagnosis not present

## 2015-04-27 DIAGNOSIS — H9190 Unspecified hearing loss, unspecified ear: Secondary | ICD-10-CM | POA: Insufficient documentation

## 2015-04-27 DIAGNOSIS — E119 Type 2 diabetes mellitus without complications: Secondary | ICD-10-CM | POA: Insufficient documentation

## 2015-04-27 DIAGNOSIS — I10 Essential (primary) hypertension: Secondary | ICD-10-CM | POA: Insufficient documentation

## 2015-04-27 DIAGNOSIS — R531 Weakness: Secondary | ICD-10-CM | POA: Insufficient documentation

## 2015-04-27 DIAGNOSIS — F329 Major depressive disorder, single episode, unspecified: Secondary | ICD-10-CM | POA: Insufficient documentation

## 2015-04-27 DIAGNOSIS — Z8719 Personal history of other diseases of the digestive system: Secondary | ICD-10-CM | POA: Diagnosis not present

## 2015-04-27 DIAGNOSIS — Z8701 Personal history of pneumonia (recurrent): Secondary | ICD-10-CM | POA: Insufficient documentation

## 2015-04-27 DIAGNOSIS — Z79899 Other long term (current) drug therapy: Secondary | ICD-10-CM | POA: Diagnosis not present

## 2015-04-27 DIAGNOSIS — G8929 Other chronic pain: Secondary | ICD-10-CM | POA: Insufficient documentation

## 2015-04-27 DIAGNOSIS — F17210 Nicotine dependence, cigarettes, uncomplicated: Secondary | ICD-10-CM | POA: Diagnosis not present

## 2015-04-27 LAB — COMPREHENSIVE METABOLIC PANEL
ALT: 17 U/L (ref 17–63)
ANION GAP: 10 (ref 5–15)
AST: 26 U/L (ref 15–41)
Albumin: 3.8 g/dL (ref 3.5–5.0)
Alkaline Phosphatase: 80 U/L (ref 38–126)
BUN: 25 mg/dL — ABNORMAL HIGH (ref 6–20)
CO2: 24 mmol/L (ref 22–32)
Calcium: 8.9 mg/dL (ref 8.9–10.3)
Chloride: 103 mmol/L (ref 101–111)
Creatinine, Ser: 1.06 mg/dL (ref 0.61–1.24)
GFR calc non Af Amer: >60 mL/min (ref >60)
Glucose, Bld: 90 mg/dL (ref 65–99)
POTASSIUM: 4.2 mmol/L (ref 3.5–5.1)
SODIUM: 137 mmol/L (ref 135–145)
TOTAL PROTEIN: 7.3 g/dL (ref 6.5–8.1)
Total Bilirubin: 1.1 mg/dL (ref 0.3–1.2)

## 2015-04-27 LAB — CBC WITH DIFFERENTIAL/PLATELET
BASOS PCT: 0 %
Basophils Absolute: 0 10*3/uL (ref 0.0–0.1)
EOS ABS: 0.1 10*3/uL (ref 0.0–0.7)
EOS PCT: 2 %
HCT: 39.4 % (ref 39.0–52.0)
Hemoglobin: 14.2 g/dL (ref 13.0–17.0)
LYMPHS ABS: 1.5 10*3/uL (ref 0.7–4.0)
Lymphocytes Relative: 28 %
MCH: 31.2 pg (ref 26.0–34.0)
MCHC: 36 g/dL (ref 30.0–36.0)
MCV: 86.6 fL (ref 78.0–100.0)
MONOS PCT: 13 %
Monocytes Absolute: 0.7 10*3/uL (ref 0.1–1.0)
NEUTROS PCT: 57 %
Neutro Abs: 3 10*3/uL (ref 1.7–7.7)
Platelets: 205 10*3/uL (ref 150–400)
RBC: 4.55 MIL/uL (ref 4.22–5.81)
RDW: 12.9 % (ref 11.5–15.5)
WBC: 5.3 10*3/uL (ref 4.0–10.5)

## 2015-04-27 NOTE — ED Notes (Signed)
Pt was transported to restroom via steady aid and pt was unable to provide urine sample. Pt refused to have in and out catherization done. MD notified and was advised to do bladder scan and notify of results.

## 2015-04-27 NOTE — ED Notes (Signed)
Pt was given more water to drink

## 2015-04-27 NOTE — ED Notes (Signed)
Bed: [facility] Expected date:  Expected time:  Means of arrival:  Comments: EMS-not eating/weakness

## 2015-04-27 NOTE — ED Provider Notes (Addendum)
CSN: [id number]     Arrival date & time 04/27/15  1754 History   First MD Initiated Contact with Patient 04/27/15 1819     Chief Complaint  Patient presents with  . Failure To Thrive    pt not eating at SNF     (Consider location/radiation/quality/duration/timing/severity/associated sxs/prior Treatment) HPI Patient presents from [facility] by EMS. Per staff the patient has had generalized weakness and decreased appetite for several days. Patient denies any complaints at this time. Specifically denies fever, headache, chest pain, abdominal pain, back pain. No nausea, vomiting or diarrhea. Past Medical History  Diagnosis Date  . COPD (chronic obstructive pulmonary disease) ([place])   . Chronic back pain   . Pneumonia     01/2011 - CAP vs aspiration pneuonmia  . Depression   . PONV (postoperative nausea and vomiting)   . Hyponatremia     Previously felt secondary to SIADH  . Hypertension   . Upper GI bleed     01/2011 with EGD showing severe candida esophagitis and duodenal bulb erosion  . Anemia     In the setting of UGI bleed 01/2011 requiring blood transfusion  . Homelessness   . Dyspnea     Chronic, thought due to COPD. Echo 02/2010 with EF 30-35%, nuclear study negative, then repeat echo 03/2011 did not show systolic dysfxn, so unclear if truly HF  . Bronchitis   . Diabetes mellitus without complication ([place])   . Collar bone fracture     left   Past Surgical History  Procedure Laterality Date  . Tonsillectomy    . Vasectomy    . Appendectomy    . Tonsillectomy    . Colonoscopy    . Esophagogastroduodenoscopy    . Esophagogastroduodenoscopy  02/03/2011    Procedure: ESOPHAGOGASTRODUODENOSCOPY (EGD);  Surgeon: Juanita Craver, MD;  Location: [facility] ENDOSCOPY;  Service: Endoscopy;  Laterality: N/A;  . Colonoscopy  01/30/2012    Procedure: COLONOSCOPY;  Surgeon: Ladene Artist, MD,FACG;  Location: [facility] ENDOSCOPY;  Service: Endoscopy;  Laterality: N/A;  . Colonoscopy N/A 04/20/2014     Procedure: COLONOSCOPY;  Surgeon: Jerene Bears, MD;  Location: [facility] ENDOSCOPY;  Service: Endoscopy;  Laterality: N/A;  . Esophagogastroduodenoscopy N/A 04/20/2014    Procedure: ESOPHAGOGASTRODUODENOSCOPY (EGD);  Surgeon: Jerene Bears, MD;  Location: [facility] ENDOSCOPY;  Service: Endoscopy;  Laterality: N/A;   Family History  Problem Relation Age of Onset  . Coronary artery disease Father     Starting in his 63's. Died of massive MI at age 49.  . Schizophrenia Brother   . Coronary artery disease Sister     MI at age 92  . Alzheimer's disease Mother    Social History  Substance Use Topics  . Smoking status: Current Some Day Smoker -- 1.00 packs/day for 60 years    Types: Cigarettes, Cigars  . Smokeless tobacco: Never Used     Comment: Since age 61  . Alcohol Use: Yes     Comment: 174 ml of scotch weekly ---- "I only drink by the drink and speicial occasion/celebrate." 04/17/14    Review of Systems  Constitutional: Positive for appetite change. Negative for fever and chills.  Respiratory: Positive for cough. Negative for shortness of breath.   Cardiovascular: Negative for chest pain.  Gastrointestinal: Negative for nausea, vomiting, abdominal pain and diarrhea.  Genitourinary: Negative for dysuria and hematuria.  Musculoskeletal: Negative for back pain and neck pain.  Skin: Negative for rash and wound.  Neurological: Positive for weakness (generalized).  Negative for dizziness, light-headedness, numbness and headaches.  All other systems reviewed and are negative.     Allergies  Benadryl and Penicillins  Home Medications   Prior to Admission medications   Medication Sig Start Date End Date Taking? Authorizing Provider  acetaminophen (TYLENOL) 650 MG CR tablet Take 650 mg by mouth every 8 (eight) hours as needed for pain.   Yes Historical Provider, MD  docusate sodium (COLACE) 100 MG capsule Take 100 mg by mouth at bedtime.   Yes Historical Provider, MD  ferrous sulfate 325 (65 FE)  MG tablet Take 325 mg by mouth daily with breakfast.   Yes Historical Provider, MD  LORazepam (ATIVAN) 1 MG tablet Take 1 mg by mouth 2 (two) times daily as needed (agitation).   Yes Historical Provider, MD  magnesium hydroxide (MILK OF MAGNESIA) 400 MG/5ML suspension Take 30 mLs by mouth daily as needed for mild constipation.   Yes Historical Provider, MD  Multiple Vitamin (DAILY VITE PO) Take 1 tablet by mouth daily.   Yes Historical Provider, MD  rivastigmine (EXELON) 1.5 MG capsule Take 1.5 mg by mouth 2 (two) times daily.   Yes Historical Provider, MD  traZODone (DESYREL) 50 MG tablet Take 50 mg by mouth 3 (three) times daily as needed (anxiety, agitation).   Yes Historical Provider, MD  amLODipine (NORVASC) 5 MG tablet Take 1 tablet (5 mg total) by mouth daily. Patient not taking: Reported on 02/27/2014 01/13/14   Debbe Odea, MD  folic acid (FOLVITE) 1 MG tablet Take 1 tablet (1 mg total) by mouth daily. Patient not taking: Reported on 02/27/2014 01/13/14   Debbe Odea, MD  guaiFENesin (MUCINEX) 600 MG 12 hr tablet Take 1 tablet (600 mg total) by mouth 2 (two) times daily as needed for to loosen phlegm. Patient not taking: Reported on 02/27/2014 01/13/14   Debbe Odea, MD  HYDROcodone-acetaminophen (NORCO/VICODIN) 5-325 MG per tablet Take 1-2 tablets by mouth every 4 (four) hours as needed for moderate pain. Patient not taking: Reported on 02/27/2014 02/21/14   Theodis Blaze, MD  ondansetron (ZOFRAN) 4 MG tablet Take 1 tablet (4 mg total) by mouth every 6 (six) hours as needed for nausea or vomiting. Patient not taking: Reported on 03/10/2014 [id number]   Delora Fuel, MD  pantoprazole (PROTONIX) 40 MG tablet Take 1 tablet (40 mg total) by mouth 2 (two) times daily before a meal. Patient not taking: Reported on 04/29/2014 04/21/14   Elberta Leatherwood, MD  thiamine 100 MG tablet Take 1 tablet (100 mg total) by mouth daily. Patient not taking: Reported on 02/27/2014 01/13/14   Debbe Odea, MD  traMADol  (ULTRAM) 50 MG tablet Take 1 tablet (50 mg total) by mouth every 6 (six) hours as needed. Patient not taking: Reported on 04/27/2015 04/29/14   Noemi Chapel, MD   BP 164/74 mmHg  Pulse 77  Temp(Src) 97.7 F (36.5 C) (Oral)  Resp 15  SpO2 97% Physical Exam  Constitutional: He is oriented to person, place, and time. He appears well-developed and well-nourished. No distress.  HENT:  Head: Normocephalic and atraumatic.  Mouth/Throat: Oropharynx is clear and moist. No oropharyngeal exudate.  Moist mucous membranes  Eyes: EOM are normal. Pupils are equal, round, and reactive to light.  Neck: Normal range of motion. Neck supple.  No meningismus  Cardiovascular: Normal rate and regular rhythm.  Exam reveals no gallop and no friction rub.   No murmur heard. Pulmonary/Chest: Effort normal. No respiratory distress. He has no wheezes. He  has rales.  Few crackles in the right base.  Abdominal: Soft. Bowel sounds are normal. He exhibits no distension and no mass. There is no tenderness. There is no rebound and no guarding.  Musculoskeletal: Normal range of motion. He exhibits no edema or tenderness.  No midline thoracic or lumbar tenderness. No lower extremity swelling or asymmetry.  Neurological: He is alert and oriented to person, place, and time.  Hard of hearing. 5/5 motor in all extremities. Sensation is fully intact.  Skin: Skin is warm and dry. No rash noted. No erythema.  Psychiatric: He has a normal mood and affect. His behavior is normal.  Nursing note and vitals reviewed.   ED Course  Procedures (including critical care time) Labs Review Labs Reviewed  COMPREHENSIVE METABOLIC PANEL - Abnormal; Notable for the following:    BUN 25 (*)    All other components within normal limits  CBC WITH DIFFERENTIAL/PLATELET    Imaging Review Dg Chest 2 View  04/27/2015  CLINICAL DATA:  Weakness and loss of appetite has few days. History of emphysema. EXAM: CHEST  2 VIEW COMPARISON:   04/17/2014. FINDINGS: The cardiac silhouette, mediastinal and hilar contours are stable. Stable tortuosity and calcification of the thoracic aorta. Chronic emphysematous and bronchitic lung changes but no definite infiltrates or effusions. Remote healed rib fractures are noted.The bony thorax is intact. IMPRESSION: Chronic lung changes but no acute pulmonary findings. Electronically Signed   By: Marijo Sanes M.D.   On: 04/27/2015 18:51   I have personally reviewed and evaluated these images and lab results as part of my medical decision-making.   EKG Interpretation None      MDM   Final diagnoses:  Decreased appetite   Patient is eating well in the emergency department. He's in no distress. Labs are within normal limits. Vital signs are stable. UA is pending. Anticipate discharge back to nursing home.     Julianne Rice, MD 04/27/15 [id number]  Julianne Rice, MD 04/27/15 2256
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
## 2015-04-27 NOTE — ED Notes (Signed)
Pt asked to urinate. Pt stated "he does not have to" Pt was informed of the importance of this. Pt refused in and out cath. Pt taken to restroom and asked to try to provide a sample

## 2015-04-27 NOTE — ED Notes (Signed)
Attempted to take patient to restroom, was advised by patient "I have to drink water to make water"  Gave patient another warm cup of water per his request.

## 2015-04-27 NOTE — ED Notes (Signed)
[facility]AR in route to take pt home

## 2015-04-27 NOTE — ED Notes (Signed)
Pt's bladder was scanned resulting 116 ml.

## 2015-04-27 NOTE — ED Notes (Signed)
Pt reminded of need for urine sample.  

## 2015-04-27 NOTE — ED Notes (Addendum)
Pt. Made aware for the need of urine, pt. states he is unable to give urine at this time.

## 2015-04-28 IMAGING — CR DG CHEST 2V
2 series · 2 of 2 positions shown · non-contrast
Comparison: 01/06/2013 and 01/28/2012

CLINICAL DATA: URI for 2 days.

EXAM:
CHEST  2 VIEW

[w chest pa]
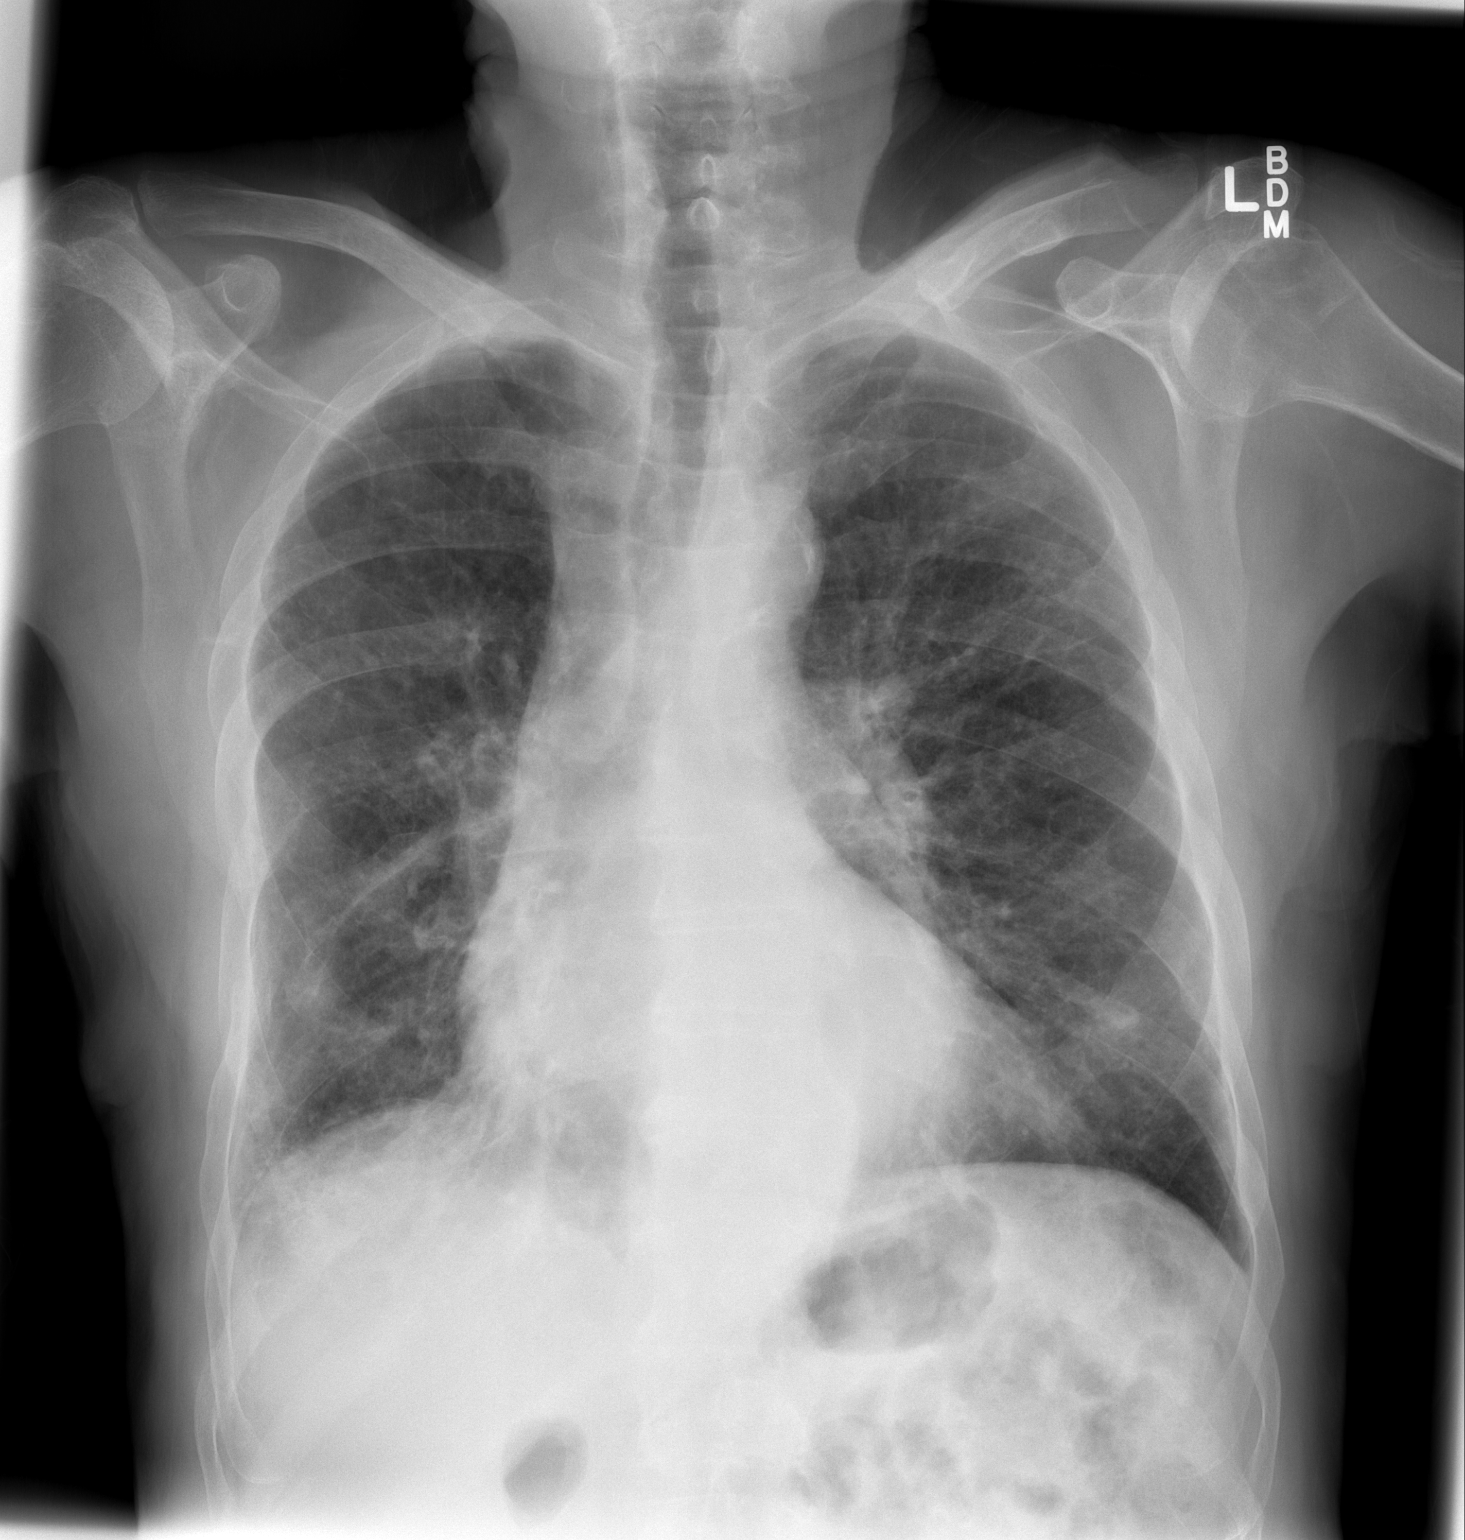

[w chest lat]
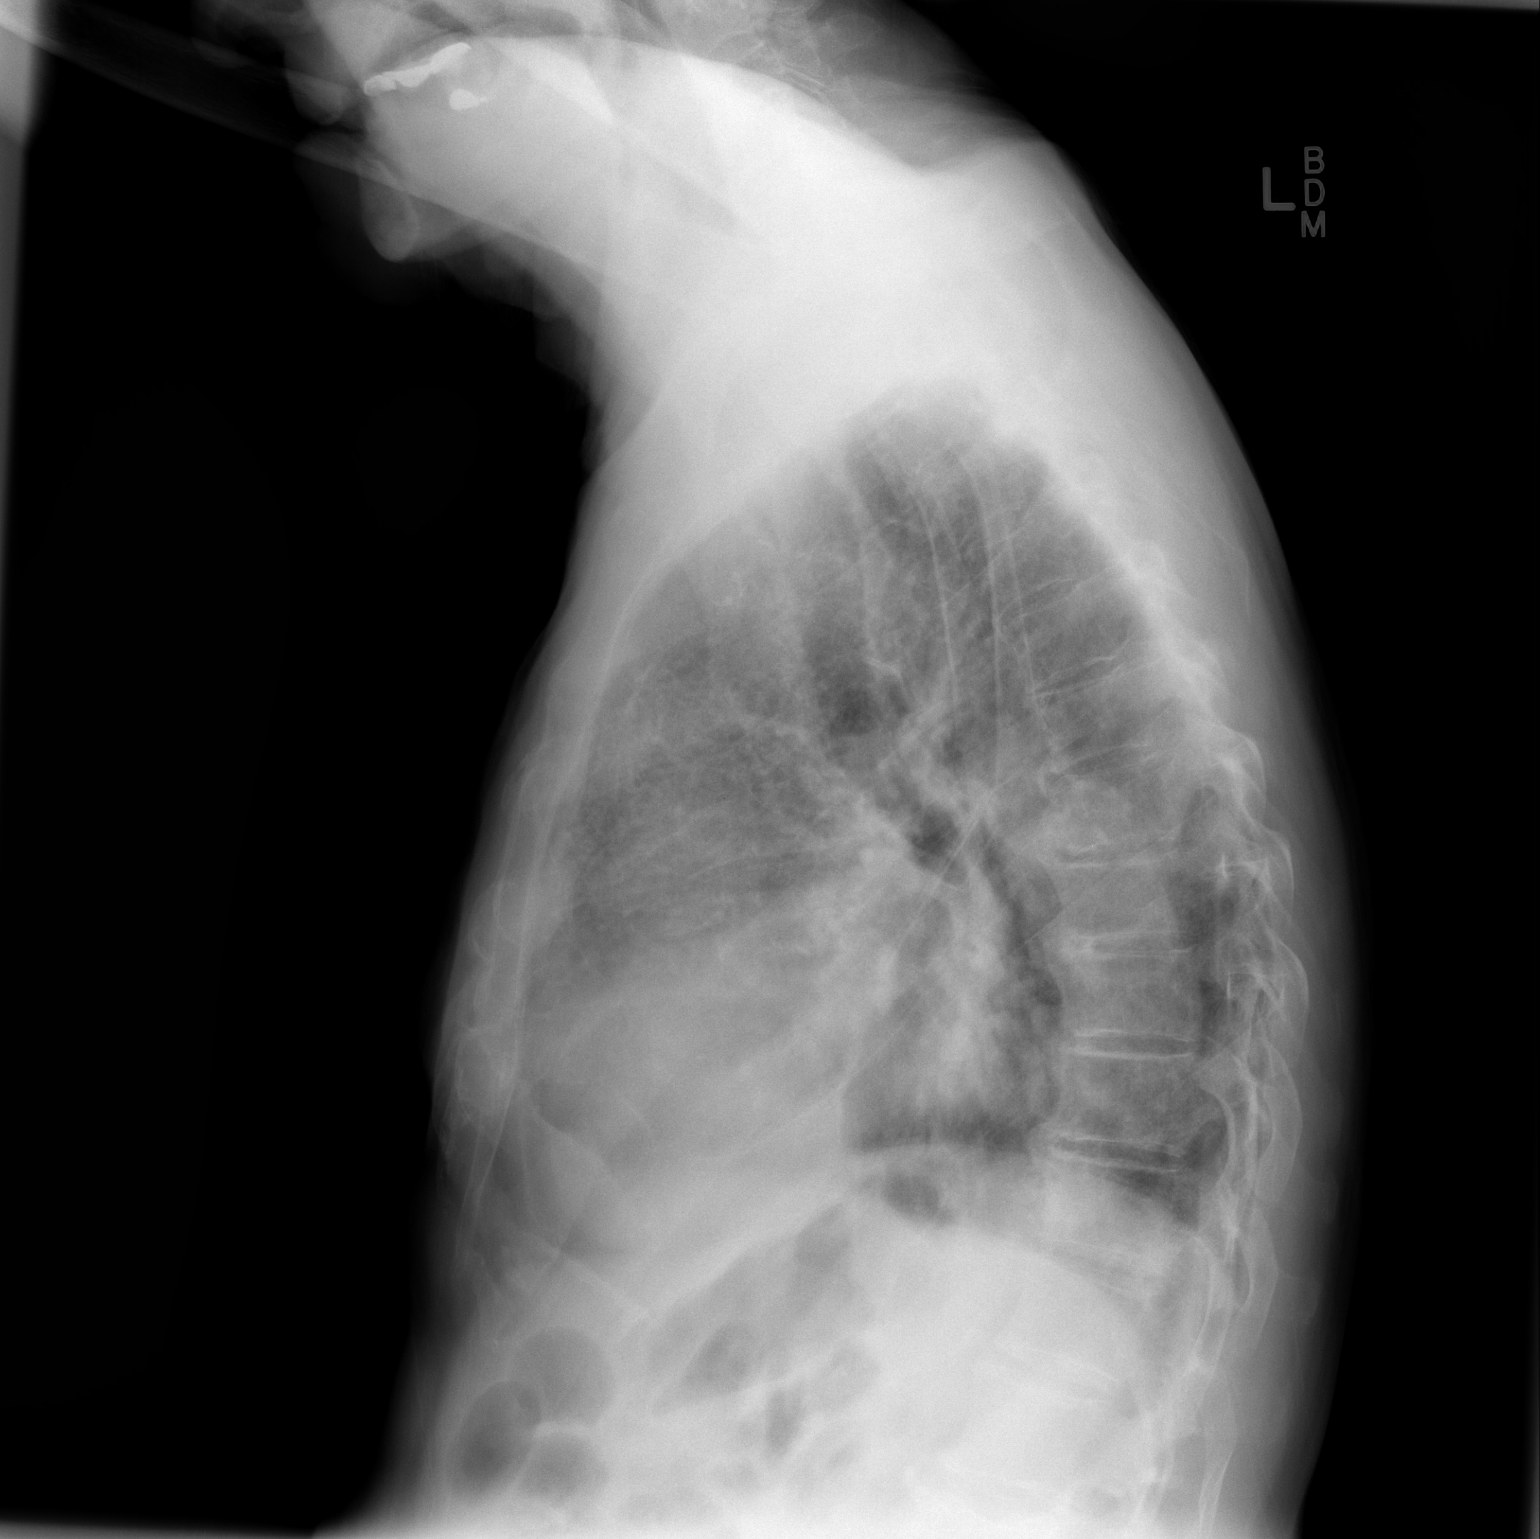

[2 of 2 positions shown; findings below may reference images not displayed]

FINDINGS: Lungs are adequately inflated with subtle peripheral interstitial
changes over the right base unchanged. There is no focal
consolidation or effusion. There is mild motion on lateral film.
There is borderline cardiomegaly. There is calcified plaque over the
aortic arch. Remainder the exam is unchanged.
IMPRESSION: No definite acute cardiopulmonary disease. Mild chronic stable
interstitial changes over the lateral right base.

Mild stable cardiomegaly.

## 2015-07-06 ENCOUNTER — Encounter (HOSPITAL_COMMUNITY): Payer: Self-pay | Admitting: Emergency Medicine

## 2015-07-06 ENCOUNTER — Emergency Department (HOSPITAL_COMMUNITY)
Admission: EM | Admit: 2015-07-06 | Discharge: 2015-07-06 | Disposition: A | Discharge status: Home / Self Care | Payer: Medicare Other | Attending: Emergency Medicine | Admitting: Emergency Medicine

## 2015-07-06 ENCOUNTER — Emergency Department (HOSPITAL_COMMUNITY): Payer: Medicare Other

## 2015-07-06 DIAGNOSIS — F17210 Nicotine dependence, cigarettes, uncomplicated: Secondary | ICD-10-CM | POA: Diagnosis not present

## 2015-07-06 DIAGNOSIS — Z79899 Other long term (current) drug therapy: Secondary | ICD-10-CM | POA: Diagnosis not present

## 2015-07-06 DIAGNOSIS — I10 Essential (primary) hypertension: Secondary | ICD-10-CM | POA: Diagnosis not present

## 2015-07-06 DIAGNOSIS — E119 Type 2 diabetes mellitus without complications: Secondary | ICD-10-CM | POA: Diagnosis not present

## 2015-07-06 DIAGNOSIS — J449 Chronic obstructive pulmonary disease, unspecified: Secondary | ICD-10-CM | POA: Insufficient documentation

## 2015-07-06 DIAGNOSIS — R197 Diarrhea, unspecified: Secondary | ICD-10-CM | POA: Insufficient documentation

## 2015-07-06 LAB — CBC WITH DIFFERENTIAL/PLATELET
Basophils Absolute: 0 10*3/uL (ref 0.0–0.1)
Basophils Relative: 0 %
Eosinophils Absolute: 0.1 10*3/uL (ref 0.0–0.7)
Eosinophils Relative: 1 %
HEMATOCRIT: 38.9 % — AB (ref 39.0–52.0)
HEMOGLOBIN: 13.8 g/dL (ref 13.0–17.0)
LYMPHS ABS: 1 10*3/uL (ref 0.7–4.0)
LYMPHS PCT: 9 %
MCH: 31.4 pg (ref 26.0–34.0)
MCHC: 35.5 g/dL (ref 30.0–36.0)
MCV: 88.6 fL (ref 78.0–100.0)
MONOS PCT: 5 %
Monocytes Absolute: 0.5 10*3/uL (ref 0.1–1.0)
NEUTROS ABS: 9.4 10*3/uL — AB (ref 1.7–7.7)
NEUTROS PCT: 85 %
Platelets: 255 10*3/uL (ref 150–400)
RBC: 4.39 MIL/uL (ref 4.22–5.81)
RDW: 13.2 % (ref 11.5–15.5)
WBC: 11 10*3/uL — ABNORMAL HIGH (ref 4.0–10.5)

## 2015-07-06 LAB — COMPREHENSIVE METABOLIC PANEL
ALBUMIN: 4.3 g/dL (ref 3.5–5.0)
ALK PHOS: 87 U/L (ref 38–126)
ALT: 13 U/L — ABNORMAL LOW (ref 17–63)
ANION GAP: 6 (ref 5–15)
AST: 19 U/L (ref 15–41)
BILIRUBIN TOTAL: 0.4 mg/dL (ref 0.3–1.2)
BUN: 38 mg/dL — ABNORMAL HIGH (ref 6–20)
CALCIUM: 9.3 mg/dL (ref 8.9–10.3)
CO2: 24 mmol/L (ref 22–32)
Chloride: 110 mmol/L (ref 101–111)
Creatinine, Ser: 1.15 mg/dL (ref 0.61–1.24)
GFR calc Af Amer: >60 mL/min (ref >60)
GFR calc non Af Amer: 57 mL/min — ABNORMAL LOW (ref >60)
GLUCOSE: 112 mg/dL — AB (ref 65–99)
Potassium: 4.5 mmol/L (ref 3.5–5.1)
Sodium: 140 mmol/L (ref 135–145)
TOTAL PROTEIN: 7.8 g/dL (ref 6.5–8.1)

## 2015-07-06 MED ORDER — SODIUM CHLORIDE 0.9 % IV BOLUS (SEPSIS)
1000.0000 mL | Freq: Once | INTRAVENOUS | Status: AC
  Administered 2015-07-06: 1000 mL via INTRAVENOUS

## 2015-07-06 MED ORDER — CIPROFLOXACIN HCL 500 MG PO TABS: 500.0000 mg | ORAL_TABLET | Freq: Two times a day (BID) | ORAL | Status: DC

## 2015-07-06 NOTE — ED Notes (Signed)
Per EMS patient comes from [facility] for diarrhea.  Patient has vascular dementia and reports to EMS that "they just want to get rid of me" (referring to staff at [facility]).  Staff reported to EMS that patient had diarrhea "several times today since 7a", but couldn't give EMS idea of how many times he been to restroom.

## 2015-07-06 NOTE — ED Provider Notes (Signed)
CSN: [id number]     Arrival date & time 07/06/15  1719 History   First MD Initiated Contact with Patient 07/06/15 1724     Chief Complaint  Patient presents with  . Diarrhea     (Consider location/radiation/quality/duration/timing/severity/associated sxs/prior Treatment) Patient is a 82 y.o. male presenting with diarrhea. The history is provided by the patient (Patient complains of diarrhea and abdominal cramping).  Diarrhea Quality:  Semi-solid Severity:  Moderate Onset quality:  Gradual Timing:  Intermittent Progression:  Unchanged Relieved by:  Nothing Worsened by:  Nothing tried Associated symptoms: no abdominal pain and no headaches     Past Medical History  Diagnosis Date  . COPD (chronic obstructive pulmonary disease) ([place])   . Chronic back pain   . Pneumonia     01/2011 - CAP vs aspiration pneuonmia  . Depression   . PONV (postoperative nausea and vomiting)   . Hyponatremia     Previously felt secondary to SIADH  . Hypertension   . Upper GI bleed     01/2011 with EGD showing severe candida esophagitis and duodenal bulb erosion  . Anemia     In the setting of UGI bleed 01/2011 requiring blood transfusion  . Homelessness   . Dyspnea     Chronic, thought due to COPD. Echo 02/2010 with EF 30-35%, nuclear study negative, then repeat echo 03/2011 did not show systolic dysfxn, so unclear if truly HF  . Bronchitis   . Diabetes mellitus without complication ([place])   . Collar bone fracture     left   Past Surgical History  Procedure Laterality Date  . Tonsillectomy    . Vasectomy    . Appendectomy    . Tonsillectomy    . Colonoscopy    . Esophagogastroduodenoscopy    . Esophagogastroduodenoscopy  02/03/2011    Procedure: ESOPHAGOGASTRODUODENOSCOPY (EGD);  Surgeon: Juanita Craver, MD;  Location: [facility] ENDOSCOPY;  Service: Endoscopy;  Laterality: N/A;  . Colonoscopy  01/30/2012    Procedure: COLONOSCOPY;  Surgeon: Ladene Artist, MD,FACG;  Location: [facility] ENDOSCOPY;  Service:  Endoscopy;  Laterality: N/A;  . Colonoscopy N/A 04/20/2014    Procedure: COLONOSCOPY;  Surgeon: Jerene Bears, MD;  Location: [facility] ENDOSCOPY;  Service: Endoscopy;  Laterality: N/A;  . Esophagogastroduodenoscopy N/A 04/20/2014    Procedure: ESOPHAGOGASTRODUODENOSCOPY (EGD);  Surgeon: Jerene Bears, MD;  Location: [facility] ENDOSCOPY;  Service: Endoscopy;  Laterality: N/A;   Family History  Problem Relation Age of Onset  . Coronary artery disease Father     Starting in his 67's. Died of massive MI at age 56.  . Schizophrenia Brother   . Coronary artery disease Sister     MI at age 79  . Alzheimer's disease Mother    Social History  Substance Use Topics  . Smoking status: Current Some Day Smoker -- 1.00 packs/day for 60 years    Types: Cigarettes, Cigars  . Smokeless tobacco: Never Used     Comment: Since age 48  . Alcohol Use: Yes     Comment: 174 ml of scotch weekly ---- "I only drink by the drink and speicial occasion/celebrate." 04/17/14    Review of Systems  Constitutional: Negative for appetite change and fatigue.  HENT: Negative for congestion, ear discharge and sinus pressure.   Eyes: Negative for discharge.  Respiratory: Negative for cough.   Cardiovascular: Negative for chest pain.  Gastrointestinal: Positive for diarrhea. Negative for abdominal pain.  Genitourinary: Negative for frequency and hematuria.  Musculoskeletal: Negative for back pain.  Skin: Negative for rash.  Neurological: Negative for seizures and headaches.  Psychiatric/Behavioral: Negative for hallucinations.      Allergies  Benadryl and Penicillins  Home Medications   Prior to Admission medications   Medication Sig Start Date End Date Taking? Authorizing Provider  divalproex (DEPAKOTE SPRINKLE) 125 MG capsule Take 125 mg by mouth 2 (two) times daily.   Yes Historical Provider, MD  ferrous sulfate 325 (65 FE) MG tablet Take 325 mg by mouth daily with breakfast.   Yes Historical Provider, MD  hydrocortisone  cream 1 % Apply 1 application topically daily as needed for itching.   Yes Historical Provider, MD  LORazepam (ATIVAN) 1 MG tablet Take 1 mg by mouth 2 (two) times daily as needed (agitation).   Yes Historical Provider, MD  mirtazapine (REMERON) 7.5 MG tablet Take 7.5 mg by mouth at bedtime.   Yes Historical Provider, MD  Multiple Vitamin (DAILY VITE PO) Take 1 tablet by mouth daily.   Yes Historical Provider, MD  rivastigmine (EXELON) 3 MG capsule Take 3 mg by mouth 2 (two) times daily.   Yes Historical Provider, MD  acetaminophen (TYLENOL) 650 MG CR tablet Take 650 mg by mouth every 8 (eight) hours as needed for pain.    Historical Provider, MD  amLODipine (NORVASC) 5 MG tablet Take 1 tablet (5 mg total) by mouth daily. Patient not taking: Reported on 02/27/2014 01/13/14   Debbe Odea, MD  ciprofloxacin (CIPRO) 500 MG tablet Take 1 tablet (500 mg total) by mouth 2 (two) times daily. 07/06/15   Milton Ferguson, MD  docusate sodium (COLACE) 100 MG capsule Take 100 mg by mouth at bedtime as needed for mild constipation.     Historical Provider, MD  folic acid (FOLVITE) 1 MG tablet Take 1 tablet (1 mg total) by mouth daily. Patient not taking: Reported on 02/27/2014 01/13/14   Debbe Odea, MD  guaiFENesin (MUCINEX) 600 MG 12 hr tablet Take 1 tablet (600 mg total) by mouth 2 (two) times daily as needed for to loosen phlegm. Patient not taking: Reported on 02/27/2014 01/13/14   Debbe Odea, MD  HYDROcodone-acetaminophen (NORCO/VICODIN) 5-325 MG per tablet Take 1-2 tablets by mouth every 4 (four) hours as needed for moderate pain. Patient not taking: Reported on 02/27/2014 02/21/14   Theodis Blaze, MD  magnesium hydroxide (MILK OF MAGNESIA) 400 MG/5ML suspension Take 30 mLs by mouth daily as needed for mild constipation.    Historical Provider, MD  ondansetron (ZOFRAN) 4 MG tablet Take 1 tablet (4 mg total) by mouth every 6 (six) hours as needed for nausea or vomiting. Patient not taking: Reported on 03/10/2014  [id number]   Delora Fuel, MD  pantoprazole (PROTONIX) 40 MG tablet Take 1 tablet (40 mg total) by mouth 2 (two) times daily before a meal. Patient not taking: Reported on 04/29/2014 04/21/14   Elberta Leatherwood, MD  thiamine 100 MG tablet Take 1 tablet (100 mg total) by mouth daily. Patient not taking: Reported on 02/27/2014 01/13/14   Debbe Odea, MD  traMADol (ULTRAM) 50 MG tablet Take 1 tablet (50 mg total) by mouth every 6 (six) hours as needed. Patient not taking: Reported on 04/27/2015 04/29/14   Noemi Chapel, MD  traZODone (DESYREL) 50 MG tablet Take 50 mg by mouth 3 (three) times daily as needed (anxiety, agitation).    Historical Provider, MD   BP 158/70 mmHg  Pulse 70  Temp(Src) 98 F (36.7 C) (Oral)  Resp 19  SpO2 98% Physical Exam  Constitutional: He is oriented to person, place, and time. He appears well-developed.  HENT:  Head: Normocephalic.  Eyes: Conjunctivae and EOM are normal. No scleral icterus.  Neck: Neck supple. No thyromegaly present.  Cardiovascular: Normal rate and regular rhythm.  Exam reveals no gallop and no friction rub.   No murmur heard. Pulmonary/Chest: No stridor. He has no wheezes. He has no rales. He exhibits no tenderness.  Abdominal: He exhibits no distension. There is no tenderness. There is no rebound.  Musculoskeletal: Normal range of motion. He exhibits no edema.  Lymphadenopathy:    He has no cervical adenopathy.  Neurological: He is oriented to person, place, and time. He exhibits normal muscle tone. Coordination normal.  Skin: No rash noted. No erythema.  Psychiatric: He has a normal mood and affect. His behavior is normal.    ED Course  Procedures (including critical care time) Labs Review Labs Reviewed  CBC WITH DIFFERENTIAL/PLATELET - Abnormal; Notable for the following:    WBC 11.0 (*)    HCT 38.9 (*)    Neutro Abs 9.4 (*)    All other components within normal limits  COMPREHENSIVE METABOLIC PANEL - Abnormal; Notable for the following:     Glucose, Bld 112 (*)    BUN 38 (*)    ALT 13 (*)    GFR calc non Af Amer 57 (*)    All other components within normal limits    Imaging Review Dg Abd Acute W/chest  07/06/2015  CLINICAL DATA:  82 year old with diarrhea. History of hypertension, diabetes and COPD. EXAM: DG ABDOMEN ACUTE W/ 1V CHEST COMPARISON:  Chest radiographs 04/27/2015.  Abdominal CT 04/17/2014. FINDINGS: The heart size and mediastinal contours are stable. There is aortic atherosclerosis and a hiatal hernia. The lungs are clear. There are old rib fractures bilaterally and an old left clavicle fracture. The bowel gas pattern is nonobstructive. There are air-fluid levels throughout the small and large bowel on the erect examination. There is no free intraperitoneal air. There are extensive vascular calcifications with a grossly stable abdominal aortic aneurysm. Superior endplate compression deformity at L2 appears grossly stable. IMPRESSION: 1. Nonspecific air-fluid levels within nondistended small and large bowel as can be seen with diarrhea of various etiologies. No evidence of bowel obstruction. 2. Stable chest. Electronically Signed   By: Richardean Sale M.D.   On: 07/06/2015 18:24   I have personally reviewed and evaluated these images and lab results as part of my medical decision-making.   EKG Interpretation None      MDM   Final diagnoses:  Diarrhea of presumed infectious origin    Patient with diarrhea most likely related to infectious etiology. Possible colitis patient will drink plenty of fluids and he will be covered with Cipro for colitis. He'll follow-up with his doctor next week    Milton Ferguson, MD 07/06/15 1859

## 2015-07-06 NOTE — Discharge Instructions (Signed)
Drink plenty of fluids follow up with her family doctor next week

## 2015-07-06 NOTE — ED Notes (Signed)
Made [facility] aware of transport

## 2015-07-06 NOTE — ED Notes (Signed)
Bed: [id number] Expected date:  Expected time:  Means of arrival:  Comments: abd pain from facility

## 2015-07-16 ENCOUNTER — Encounter (HOSPITAL_COMMUNITY): Payer: Self-pay | Admitting: Emergency Medicine

## 2015-07-16 ENCOUNTER — Emergency Department (HOSPITAL_COMMUNITY)
Admission: EM | Admit: 2015-07-16 | Discharge: 2015-07-16 | Disposition: A | Discharge status: Home / Self Care | Payer: Medicare Other | Attending: Emergency Medicine | Admitting: Emergency Medicine

## 2015-07-16 DIAGNOSIS — Y939 Activity, unspecified: Secondary | ICD-10-CM | POA: Diagnosis not present

## 2015-07-16 DIAGNOSIS — Y999 Unspecified external cause status: Secondary | ICD-10-CM | POA: Insufficient documentation

## 2015-07-16 DIAGNOSIS — E119 Type 2 diabetes mellitus without complications: Secondary | ICD-10-CM | POA: Insufficient documentation

## 2015-07-16 DIAGNOSIS — S50312A Abrasion of left elbow, initial encounter: Secondary | ICD-10-CM | POA: Insufficient documentation

## 2015-07-16 DIAGNOSIS — I10 Essential (primary) hypertension: Secondary | ICD-10-CM | POA: Diagnosis not present

## 2015-07-16 DIAGNOSIS — F17210 Nicotine dependence, cigarettes, uncomplicated: Secondary | ICD-10-CM | POA: Diagnosis not present

## 2015-07-16 DIAGNOSIS — Z23 Encounter for immunization: Secondary | ICD-10-CM | POA: Diagnosis not present

## 2015-07-16 DIAGNOSIS — J449 Chronic obstructive pulmonary disease, unspecified: Secondary | ICD-10-CM | POA: Insufficient documentation

## 2015-07-16 DIAGNOSIS — F329 Major depressive disorder, single episode, unspecified: Secondary | ICD-10-CM | POA: Diagnosis not present

## 2015-07-16 DIAGNOSIS — W19XXXA Unspecified fall, initial encounter: Secondary | ICD-10-CM | POA: Insufficient documentation

## 2015-07-16 DIAGNOSIS — Y929 Unspecified place or not applicable: Secondary | ICD-10-CM | POA: Diagnosis not present

## 2015-07-16 DIAGNOSIS — F0390 Unspecified dementia without behavioral disturbance: Secondary | ICD-10-CM | POA: Diagnosis not present

## 2015-07-16 DIAGNOSIS — Z8709 Personal history of other diseases of the respiratory system: Secondary | ICD-10-CM

## 2015-07-16 LAB — CBC
HCT: 38.2 % — ABNORMAL LOW (ref 39.0–52.0)
Hemoglobin: 13.1 g/dL (ref 13.0–17.0)
MCH: 30.6 pg (ref 26.0–34.0)
MCHC: 34.3 g/dL (ref 30.0–36.0)
MCV: 89.3 fL (ref 78.0–100.0)
PLATELETS: 164 10*3/uL (ref 150–400)
RBC: 4.28 MIL/uL (ref 4.22–5.81)
RDW: 13.2 % (ref 11.5–15.5)
WBC: 5.4 10*3/uL (ref 4.0–10.5)

## 2015-07-16 LAB — BASIC METABOLIC PANEL
Anion gap: 7 (ref 5–15)
BUN: 26 mg/dL — AB (ref 6–20)
CALCIUM: 8.9 mg/dL (ref 8.9–10.3)
CO2: 27 mmol/L (ref 22–32)
Chloride: 105 mmol/L (ref 101–111)
Creatinine, Ser: 1.26 mg/dL — ABNORMAL HIGH (ref 0.61–1.24)
GFR calc Af Amer: 59 mL/min — ABNORMAL LOW (ref >60)
GFR, EST NON AFRICAN AMERICAN: 51 mL/min — AB (ref >60)
GLUCOSE: 115 mg/dL — AB (ref 65–99)
Potassium: 4.9 mmol/L (ref 3.5–5.1)
Sodium: 139 mmol/L (ref 135–145)

## 2015-07-16 MED ORDER — TETANUS-DIPHTH-ACELL PERTUSSIS 5-2.5-18.5 LF-MCG/0.5 IM SUSP
0.5000 mL | Freq: Once | INTRAMUSCULAR | Status: AC
  Administered 2015-07-16: 0.5 mL via INTRAMUSCULAR
  Filled 2015-07-16: qty 0.5

## 2015-07-16 NOTE — ED Notes (Signed)
Pt given ham sandwich and coffee

## 2015-07-16 NOTE — ED Provider Notes (Signed)
CSN: [id number]     Arrival date & time 07/16/15  1255 History   First MD Initiated Contact with Patient 07/16/15 1300     Chief Complaint  Patient presents with  . Fall     (Consider location/radiation/quality/duration/timing/severity/associated sxs/prior Treatment) Patient is a 82 y.o. male presenting with fall. The history is provided by the patient, the EMS personnel and a relative. The history is limited by the condition of the patient.  Fall Pertinent negatives include no chest pain, no abdominal pain, no headaches and no shortness of breath.  Patient from [facility], s/p fall, abrasion to left elbow.  Patient states he didn't fall, but rather that he just sits down on ground whenever/whereever he feels the need to.  Abrasion to left elbow. Patient denies other pain or injury.  No LOC, and pts mental status described as being c/w baseline.  Patient denies headache. No chest pain or sob. Denies feeling faint. No fevers.         Past Medical History  Diagnosis Date  . COPD (chronic obstructive pulmonary disease) ([place])   . Chronic back pain   . Pneumonia     01/2011 - CAP vs aspiration pneuonmia  . Depression   . PONV (postoperative nausea and vomiting)   . Hyponatremia     Previously felt secondary to SIADH  . Hypertension   . Upper GI bleed     01/2011 with EGD showing severe candida esophagitis and duodenal bulb erosion  . Anemia     In the setting of UGI bleed 01/2011 requiring blood transfusion  . Homelessness   . Dyspnea     Chronic, thought due to COPD. Echo 02/2010 with EF 30-35%, nuclear study negative, then repeat echo 03/2011 did not show systolic dysfxn, so unclear if truly HF  . Bronchitis   . Diabetes mellitus without complication ([place])   . Collar bone fracture     left   Past Surgical History  Procedure Laterality Date  . Tonsillectomy    . Vasectomy    . Appendectomy    . Tonsillectomy    . Colonoscopy    . Esophagogastroduodenoscopy    .  Esophagogastroduodenoscopy  02/03/2011    Procedure: ESOPHAGOGASTRODUODENOSCOPY (EGD);  Surgeon: Juanita Craver, MD;  Location: [facility] END[facility]COPY;  Service: Endoscopy;  Laterality: N/A;  . Colonoscopy  01/30/2012    Procedure: COLONOSCOPY;  Surgeon: Ladene Artist, MD,FACG;  Location: [facility] ENDOSCOPY;  Service: Endoscopy;  Laterality: N/A;  . Colonoscopy N/A 04/20/2014    Procedure: COLONOSCOPY;  Surgeon: Jerene Bears, MD;  Location: [facility] ENDOSCOPY;  Service: Endoscopy;  Laterality: N/A;  . Esophagogastroduodenoscopy N/A 04/20/2014    Procedure: ESOPHAGOGASTRODUODENOSCOPY (EGD);  Surgeon: Jerene Bears, MD;  Location: [facility] ENDOSCOPY;  Service: Endoscopy;  Laterality: N/A;   Family History  Problem Relation Age of Onset  . Coronary artery disease Father     Starting in his 77's. Died of massive MI at age 77.  . Schizophrenia Brother   . Coronary artery disease Sister     MI at age 40  . Alzheimer's disease Mother    Social History  Substance Use Topics  . Smoking status: Current Some Day Smoker -- 1.00 packs/day for 60 years    Types: Cigarettes, Cigars  . Smokeless tobacco: Never Used     Comment: Since age 82  . Alcohol Use: Yes     Comment: 174 ml of scotch weekly ---- "I only drink by the drink and speicial occasion/celebrate." 04/17/14  Review of Systems  Constitutional: Negative for fever.  HENT: Negative for sore throat.   Eyes: Negative for visual disturbance.  Respiratory: Negative for cough and shortness of breath.   Cardiovascular: Negative for chest pain.  Gastrointestinal: Negative for vomiting, abdominal pain and diarrhea.  Genitourinary: Negative for flank pain.  Musculoskeletal: Negative for back pain and neck pain.  Skin: Negative for rash.  Neurological: Negative for headaches.  Hematological: Does not bruise/bleed easily.  Psychiatric/Behavioral: Negative for confusion.      Allergies  Benadryl and Penicillins  Home Medications   Prior to Admission medications    Medication Sig Start Date End Date Taking? Authorizing Provider  acetaminophen (TYLENOL) 650 MG CR tablet Take 650 mg by mouth every 8 (eight) hours as needed for pain.    Historical Provider, MD  amLODipine (NORVASC) 5 MG tablet Take 1 tablet (5 mg total) by mouth daily. Patient not taking: Reported on 02/27/2014 01/13/14   Debbe Odea, MD  ciprofloxacin (CIPRO) 500 MG tablet Take 1 tablet (500 mg total) by mouth 2 (two) times daily. 07/06/15   Milton Ferguson, MD  divalproex (DEPAKOTE SPRINKLE) 125 MG capsule Take 125 mg by mouth 2 (two) times daily.    Historical Provider, MD  docusate sodium (COLACE) 100 MG capsule Take 100 mg by mouth at bedtime as needed for mild constipation.     Historical Provider, MD  ferrous sulfate 325 (65 FE) MG tablet Take 325 mg by mouth daily with breakfast.    Historical Provider, MD  folic acid (FOLVITE) 1 MG tablet Take 1 tablet (1 mg total) by mouth daily. Patient not taking: Reported on 02/27/2014 01/13/14   Debbe Odea, MD  guaiFENesin (MUCINEX) 600 MG 12 hr tablet Take 1 tablet (600 mg total) by mouth 2 (two) times daily as needed for to loosen phlegm. Patient not taking: Reported on 02/27/2014 01/13/14   Debbe Odea, MD  HYDROcodone-acetaminophen (NORCO/VICODIN) 5-325 MG per tablet Take 1-2 tablets by mouth every 4 (four) hours as needed for moderate pain. Patient not taking: Reported on 02/27/2014 02/21/14   Theodis Blaze, MD  hydrocortisone cream 1 % Apply 1 application topically daily as needed for itching.    Historical Provider, MD  LORazepam (ATIVAN) 1 MG tablet Take 1 mg by mouth 2 (two) times daily as needed (agitation).    Historical Provider, MD  magnesium hydroxide (MILK OF MAGNESIA) 400 MG/5ML suspension Take 30 mLs by mouth daily as needed for mild constipation.    Historical Provider, MD  mirtazapine (REMERON) 7.5 MG tablet Take 7.5 mg by mouth at bedtime.    Historical Provider, MD  Multiple Vitamin (DAILY VITE PO) Take 1 tablet by mouth daily.     Historical Provider, MD  ondansetron (ZOFRAN) 4 MG tablet Take 1 tablet (4 mg total) by mouth every 6 (six) hours as needed for nausea or vomiting. Patient not taking: Reported on 03/10/2014 [id number]   Delora Fuel, MD  pantoprazole (PROTONIX) 40 MG tablet Take 1 tablet (40 mg total) by mouth 2 (two) times daily before a meal. Patient not taking: Reported on 04/29/2014 04/21/14   Elberta Leatherwood, MD  rivastigmine (EXELON) 3 MG capsule Take 3 mg by mouth 2 (two) times daily.    Historical Provider, MD  thiamine 100 MG tablet Take 1 tablet (100 mg total) by mouth daily. Patient not taking: Reported on 02/27/2014 01/13/14   Debbe Odea, MD  traMADol (ULTRAM) 50 MG tablet Take 1 tablet (50 mg total) by mouth every  6 (six) hours as needed. Patient not taking: Reported on 04/27/2015 04/29/14   Noemi Chapel, MD  traZODone (DESYREL) 50 MG tablet Take 50 mg by mouth 3 (three) times daily as needed (anxiety, agitation).    Historical Provider, MD   BP 118/92 mmHg  Pulse 73  Temp(Src) 98.3 F (36.8 C) (Oral)  Resp 24  SpO2 98% Physical Exam  Constitutional: He appears well-developed and well-nourished. No distress.  HENT:  Head: Atraumatic.  No facial or scalp STS or tenderness.   Eyes: Pupils are equal, round, and reactive to light. No scleral icterus.  Neck: Neck supple. No tracheal deviation present.  Cardiovascular: Normal rate, regular rhythm, normal heart sounds and intact distal pulses.   Pulmonary/Chest: Effort normal and breath sounds normal. No accessory muscle usage. No respiratory distress. He exhibits no tenderness.  Abdominal: Soft. Bowel sounds are normal. He exhibits no distension. There is no tenderness.  Musculoskeletal: Normal range of motion. He exhibits no edema or tenderness.  CTLS spine, non tender, aligned, no step off. Good rom bil ext without pain or focal bony tenderness.   Neurological: He is alert.  Awake and alert, speech clear. Motor intact bil, stre 5/5.   Skin: Skin is  warm and dry. He is not diaphoretic.  Small, superficial abrasion to left elbow.   Psychiatric: He has a normal mood and affect.  Nursing note and vitals reviewed.   ED Course  Procedures (including critical care time) Labs Review    I have personally reviewed and evaluated these lab results as part of my medical decision-making.    MDM   Reviewed nursing notes and prior charts for additional history.   Small abrasion to left elbow, bacitracin and dressing.  Tetanus updated.  Patient denies pain or other c/o.  Will check basic labs.  Labs unremarkable.    Family member now present, states current mental status c/w baseline. Given no loc, no head contusion on exam, mental status c/w baseline, and no pt c/o -  Feel advanced imaging not warranted.  Family member indicates prior to current facility where patient has lived from past several months, [facility], and indicates has dementia at baseline.    On recheck, remains awake and alert. No c/o pain or other new c/o.  Chest cta. No increased WOB.   Filed Vitals:   07/16/15 1310 07/16/15 1449  BP: 118/92 137/73  Pulse: 73 83  Temp: 98.3 F (36.8 C)   Resp: 24 24     Po fluids/happy meal.  Patient currently appears stable for d/c.      Lajean Saver, MD 07/16/15 [phone]
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
## 2015-07-16 NOTE — ED Notes (Signed)
Bed: [id number] Expected date:  Expected time:  Means of arrival:  Comments: EMS- 82 yo fall

## 2015-07-16 NOTE — ED Notes (Addendum)
Dale Mcfarland, daughter, states that patient has chronic bronchitis and coughing today...which could lead to him getting short of breath and may be why he kept sitting down at facility.  Made Dr Ashok Cordia aware.

## 2015-07-16 NOTE — Discharge Instructions (Signed)
It was our pleasure to provide your ER care today - we hope that you feel better.  Rest. Drink plenty of fluids.  Consider supplementing diet with nutritious shake such as Ensure or Boost.  Fall precautions, ambulate with assistance.  Follow up with primary care doctor in the next few days for recheck.  Return to ER if worse, new symptoms, fevers, trouble breathing, other medical emergency.    Fall Prevention in the Home  Falls can cause injuries. They can happen to people of all ages. There are many things you can do to make your home safe and to help prevent falls.  WHAT CAN I DO ON THE OUTSIDE OF MY HOME?  Regularly fix the edges of walkways and driveways and fix any cracks.  Remove anything that might make you trip as you walk through a door, such as a raised step or threshold.  Trim any bushes or trees on the path to your home.  Use bright outdoor lighting.  Clear any walking paths of anything that might make someone trip, such as rocks or tools.  Regularly check to see if handrails are loose or broken. Make sure that both sides of any steps have handrails.  Any raised decks and porches should have guardrails on the edges.  Have any leaves, snow, or ice cleared regularly.  Use sand or salt on walking paths during winter.  Clean up any spills in your garage right away. This includes oil or grease spills. WHAT CAN I DO IN THE BATHROOM?   Use night lights.  Install grab bars by the toilet and in the tub and shower. Do not use towel bars as grab bars.  Use non-skid mats or decals in the tub or shower.  If you need to sit down in the shower, use a plastic, non-slip stool.  Keep the floor dry. Clean up any water that spills on the floor as soon as it happens.  Remove soap buildup in the tub or shower regularly.  Attach bath mats securely with double-sided non-slip rug tape.  Do not have throw rugs and other things on the floor that can make you trip. WHAT CAN I DO  IN THE BEDROOM?  Use night lights.  Make sure that you have a light by your bed that is easy to reach.  Do not use any sheets or blankets that are too big for your bed. They should not hang down onto the floor.  Have a firm chair that has side arms. You can use this for support while you get dressed.  Do not have throw rugs and other things on the floor that can make you trip. WHAT CAN I DO IN THE KITCHEN?  Clean up any spills right away.  Avoid walking on wet floors.  Keep items that you use a lot in easy-to-reach places.  If you need to reach something above you, use a strong step stool that has a grab bar.  Keep electrical cords out of the way.  Do not use floor polish or wax that makes floors slippery. If you must use wax, use non-skid floor wax.  Do not have throw rugs and other things on the floor that can make you trip. WHAT CAN I DO WITH MY STAIRS?  Do not leave any items on the stairs.  Make sure that there are handrails on both sides of the stairs and use them. Fix handrails that are broken or loose. Make sure that handrails are as long as the  stairways.  Check any carpeting to make sure that it is firmly attached to the stairs. Fix any carpet that is loose or worn.  Avoid having throw rugs at the top or bottom of the stairs. If you do have throw rugs, attach them to the floor with carpet tape.  Make sure that you have a light switch at the top of the stairs and the bottom of the stairs. If you do not have them, ask someone to add them for you. WHAT ELSE CAN I DO TO HELP PREVENT FALLS?  Wear shoes that:  Do not have high heels.  Have rubber bottoms.  Are comfortable and fit you well.  Are closed at the toe. Do not wear sandals.  If you use a stepladder:  Make sure that it is fully opened. Do not climb a closed stepladder.  Make sure that both sides of the stepladder are locked into place.  Ask someone to hold it for you, if possible.  Clearly mark  and make sure that you can see:  Any grab bars or handrails.  First and last steps.  Where the edge of each step is.  Use tools that help you move around (mobility aids) if they are needed. These include:  Canes.  Walkers.  Scooters.  Crutches.  Turn on the lights when you go into a dark area. Replace any light bulbs as soon as they burn out.  Set up your furniture so you have a clear path. Avoid moving your furniture around.  If any of your floors are uneven, fix them.  If there are any pets around you, be aware of where they are.  Review your medicines with your doctor. Some medicines can make you feel dizzy. This can increase your chance of falling. Ask your doctor what other things that you can do to help prevent falls.   This information is not intended to replace advice given to you by your health care provider. Make sure you discuss any questions you have with your health care provider.   Document Released: 11/24/2008 Document Revised: 06/14/2014 Document Reviewed: 03/04/2014 Elsevier Interactive Patient Education 2016 [place] An abrasion is a cut or scrape on the outer surface of your skin. An abrasion does not extend through all of the layers of your skin. It is important to care for your abrasion properly to prevent infection. CAUSES Most abrasions are caused by falling on or gliding across the ground or another surface. When your skin rubs on something, the outer and inner layer of skin rubs off.  SYMPTOMS A cut or scrape is the main symptom of this condition. The scrape may be bleeding, or it may appear red or pink. If there was an associated fall, there may be an underlying bruise. DIAGNOSIS An abrasion is diagnosed with a physical exam. TREATMENT Treatment for this condition depends on how large and deep the abrasion is. Usually, your abrasion will be cleaned with water and mild soap. This removes any dirt or debris that may be stuck. An  antibiotic ointment may be applied to the abrasion to help prevent infection. A bandage (dressing) may be placed on the abrasion to keep it clean. You may also need a tetanus shot. HOME CARE INSTRUCTIONS Medicines  Take or apply medicines only as directed by your health care provider.  If you were prescribed an antibiotic ointment, finish all of it even if you start to feel better. Wound Care  Clean the wound with mild soap  and water 2-3 times per day or as directed by your health care provider. Pat your wound dry with a clean towel. Do not rub it.  There are many different ways to close and cover a wound. Follow instructions from your health care provider about:  Wound care.  Dressing changes and removal.  Check your wound every day for signs of infection. Watch for:  Redness, swelling, or pain.  Fluid, blood, or pus. General Instructions  Keep the dressing dry as directed by your health care provider. Do not take baths, swim, use a hot tub, or do anything that would put your wound underwater until your health care provider approves.  If there is swelling, raise (elevate) the injured area above the level of your heart while you are sitting or lying down.  Keep all follow-up visits as directed by your health care provider. This is important. SEEK MEDICAL CARE IF:  You received a tetanus shot and you have swelling, severe pain, redness, or bleeding at the injection site.  Your pain is not controlled with medicine.  You have increased redness, swelling, or pain at the site of your wound. SEEK IMMEDIATE MEDICAL CARE IF:  You have a red streak going away from your wound.  You have a fever.  You have fluid, blood, or pus coming from your wound.  You notice a bad smell coming from your wound or your dressing.   This information is not intended to replace advice given to you by your health care provider. Make sure you discuss any questions you have with your health care  provider.   Document Released: 11/07/2004 Document Revised: 10/19/2014 Document Reviewed: 01/26/2014 Elsevier Interactive Patient Education [employer].

## 2015-07-16 NOTE — ED Notes (Signed)
Instructions given to daughter. [facility] called for transport.

## 2015-10-07 ENCOUNTER — Encounter (HOSPITAL_COMMUNITY): Payer: Self-pay | Admitting: Emergency Medicine

## 2015-10-07 ENCOUNTER — Emergency Department (HOSPITAL_COMMUNITY)
Admission: EM | Admit: 2015-10-07 | Discharge: 2015-10-07 | Disposition: A | Discharge status: Home / Self Care | Payer: Medicare Other | Attending: Emergency Medicine | Admitting: Emergency Medicine

## 2015-10-07 ENCOUNTER — Emergency Department (HOSPITAL_COMMUNITY): Payer: Medicare Other

## 2015-10-07 DIAGNOSIS — F17210 Nicotine dependence, cigarettes, uncomplicated: Secondary | ICD-10-CM | POA: Diagnosis not present

## 2015-10-07 DIAGNOSIS — E119 Type 2 diabetes mellitus without complications: Secondary | ICD-10-CM | POA: Diagnosis not present

## 2015-10-07 DIAGNOSIS — Y939 Activity, unspecified: Secondary | ICD-10-CM | POA: Diagnosis not present

## 2015-10-07 DIAGNOSIS — W010XXA Fall on same level from slipping, tripping and stumbling without subsequent striking against object, initial encounter: Secondary | ICD-10-CM | POA: Diagnosis not present

## 2015-10-07 DIAGNOSIS — I10 Essential (primary) hypertension: Secondary | ICD-10-CM | POA: Insufficient documentation

## 2015-10-07 DIAGNOSIS — Z7984 Long term (current) use of oral hypoglycemic drugs: Secondary | ICD-10-CM | POA: Insufficient documentation

## 2015-10-07 DIAGNOSIS — M79671 Pain in right foot: Secondary | ICD-10-CM

## 2015-10-07 DIAGNOSIS — M25571 Pain in right ankle and joints of right foot: Secondary | ICD-10-CM | POA: Insufficient documentation

## 2015-10-07 DIAGNOSIS — Z79899 Other long term (current) drug therapy: Secondary | ICD-10-CM | POA: Insufficient documentation

## 2015-10-07 DIAGNOSIS — Y9289 Other specified places as the place of occurrence of the external cause: Secondary | ICD-10-CM | POA: Diagnosis not present

## 2015-10-07 DIAGNOSIS — J449 Chronic obstructive pulmonary disease, unspecified: Secondary | ICD-10-CM | POA: Insufficient documentation

## 2015-10-07 DIAGNOSIS — W19XXXA Unspecified fall, initial encounter: Secondary | ICD-10-CM

## 2015-10-07 DIAGNOSIS — Y999 Unspecified external cause status: Secondary | ICD-10-CM | POA: Diagnosis not present

## 2015-10-07 MED ORDER — ACETAMINOPHEN 325 MG PO TABS
650.0000 mg | ORAL_TABLET | Freq: Once | ORAL | Status: AC
  Administered 2015-10-07: 650 mg via ORAL
  Filled 2015-10-07: qty 2

## 2015-10-07 NOTE — ED Provider Notes (Signed)
[facility] DEPT Provider Note   CSN: [id number] Arrival date & time: 10/07/15  0031   By signing my name below, I, Evelene Croon, attest that this documentation has been prepared under the direction and in the presence of Jola Schmidt, MD . Electronically Signed: Evelene Croon, Scribe. 10/07/2015. 2:11 AM.   History   Chief Complaint Chief Complaint  Patient presents with  . Fall    LEVEL 5 CAVEAT DUE TO DEMENTIA  The history is provided by the patient. No language interpreter was used.   HPI Comments:  Dale Mcfarland is a 82 y.o. male with a history of dementia, who presents to the Emergency Department via EMS from NH, complaining of constant, right ankle pain s/p mechanical fall yesterday AM. Pt states he tripped and fell. He states he was able to ambulate with the aid of a crutch; notes he ambulates with a walker at baseline. Pt denies other injuries, complaints, or symptoms at this time. Per EMS pt was  found laying on the ground in front of the nursing station during shift change. Pt has a h/o dementia and is an unreliable historian.   Past Medical History:  Diagnosis Date  . Anemia    In the setting of UGI bleed 01/2011 requiring blood transfusion  . Bronchitis   . Chronic back pain   . Collar bone fracture    left  . COPD (chronic obstructive pulmonary disease) ([place])   . Depression   . Diabetes mellitus without complication ([place])   . Dyspnea    Chronic, thought due to COPD. Echo 02/2010 with EF 30-35%, nuclear study negative, then repeat echo 03/2011 did not show systolic dysfxn, so unclear if truly HF  . Homelessness   . Hypertension   . Hyponatremia    Previously felt secondary to SIADH  . Pneumonia    01/2011 - CAP vs aspiration pneuonmia  . PONV (postoperative nausea and vomiting)   . Upper GI bleed    01/2011 with EGD showing severe candida esophagitis and duodenal bulb erosion    Patient Active Problem List   Diagnosis Date Noted  . History of colonic  polyps   . Benign neoplasm of ascending colon   . Benign neoplasm of transverse colon   . Benign neoplasm of descending colon   . Benign neoplasm of sigmoid colon   . Hematemesis without nausea   . Acute esophagitis   . Duodenitis   . Bleeding gastrointestinal   . Esophagitis   . COLD (chronic obstructive lung disease) ([place])   . Essential hypertension   . Alcohol abuse   . Protein-calorie malnutrition, severe ([place]) 02/19/2014  . Atrial flutter with rapid ventricular response ([place]) 02/16/2014  . Generalized abdominal pain   . Acute alcoholic pancreatitis [id number]  . Diabetes mellitus without complication ([place]) [id number]  . CAP (community acquired pneumonia) 01/09/2014  . Atrial fibrillation with rapid ventricular response ([place]) 07/04/2013  . A-fib ([place]) 07/04/2013  . COPD exacerbation ([place]) 06/15/2013  . Community acquired pneumonia 06/15/2013  . Atrial fibrillation with RVR ([place]) 03/03/2013  . CHF exacerbation ([place]) 03/02/2013  . Normocytic anemia 03/01/2013  . Long QT interval 03/01/2013  . Influenza due to identified novel influenza A virus with other respiratory manifestations 03/01/2013  . GI bleed 01/06/2013  . Reflux esophagitis 01/06/2013  . Esophageal mass 12/03/2012  . Rib fractures 11/22/2012  . Elbow fracture, right 11/22/2012  . Benign neoplasm of colon 01/30/2012  . Heme positive stool 01/29/2012  . Dyspnea on exertion 01/29/2012  .  Iron deficiency anemia, unspecified 01/29/2012  . Homelessness 05/04/2011  . CHF (congestive heart failure) ([place]) 03/28/2011  . COPD (chronic obstructive pulmonary disease) ([place]) 03/28/2011  . Tobacco abuse 03/28/2011  . Shortness of breath 03/28/2011  . Dehydration 02/27/2011  . Syncope 02/27/2011  . Pneumonia 02/02/2011  . Upper GI bleeding 02/02/2011  . Alcoholism ([place]) 02/02/2011  . Leukocytosis 02/02/2011  . Anemia associated with acute blood loss 02/02/2011    Past Surgical History:  Procedure Laterality Date  .  APPENDECTOMY    . COLONOSCOPY    . COLONOSCOPY  01/30/2012   Procedure: COLONOSCOPY;  Surgeon: Ladene Artist, MD,FACG;  Location: [facility] ENDOSCOPY;  Service: Endoscopy;  Laterality: N/A;  . COLONOSCOPY N/A 04/20/2014   Procedure: COLONOSCOPY;  Surgeon: Jerene Bears, MD;  Location: [facility] ENDOSCOPY;  Service: Endoscopy;  Laterality: N/A;  . ESOPHAGOGASTRODUODENOSCOPY    . ESOPHAGOGASTRODUODENOSCOPY  02/03/2011   Procedure: ESOPHAGOGASTRODUODENOSCOPY (EGD);  Surgeon: Juanita Craver, MD;  Location: [facility] ENDOSCOPY;  Service: Endoscopy;  Laterality: N/A;  . ESOPHAGOGASTRODUODENOSCOPY N/A 04/20/2014   Procedure: ESOPHAGOGASTRODUODENOSCOPY (EGD);  Surgeon: Jerene Bears, MD;  Location: [facility] ENDOSCOPY;  Service: Endoscopy;  Laterality: N/A;  . TONSILLECTOMY    . TONSILLECTOMY    . VASECTOMY       Home Medications    Prior to Admission medications   Medication Sig Start Date End Date Taking? Authorizing Provider  acetaminophen (TYLENOL) 650 MG CR tablet Take 650 mg by mouth every 8 (eight) hours as needed for pain.   Yes Historical Provider, MD  divalproex (DEPAKOTE SPRINKLE) 125 MG capsule Take 125 mg by mouth 3 (three) times daily.    Yes Historical Provider, MD  docusate sodium (COLACE) 100 MG capsule Take 100 mg by mouth at bedtime as needed for mild constipation.    Yes Historical Provider, MD  ferrous sulfate 325 (65 FE) MG tablet Take 325 mg by mouth daily with breakfast.   Yes Historical Provider, MD  hydrocortisone cream 1 % Apply 1 application topically daily as needed for itching.   Yes Historical Provider, MD  LORazepam (ATIVAN) 1 MG tablet Take 1 mg by mouth 2 (two) times daily as needed (agitation).   Yes Historical Provider, MD  magnesium hydroxide (MILK OF MAGNESIA) 400 MG/5ML suspension Take 30 mLs by mouth daily as needed for mild constipation.   Yes Historical Provider, MD  mirtazapine (REMERON) 7.5 MG tablet Take 7.5 mg by mouth at bedtime.   Yes Historical Provider, MD  Multiple Vitamin (DAILY  VITE PO) Take 1 tablet by mouth daily with breakfast.    Yes Historical Provider, MD  risperiDONE (RISPERDAL) 0.5 MG tablet Take 0.5 mg by mouth at bedtime. 09/12/15  Yes Historical Provider, MD  rivastigmine (EXELON) 3 MG capsule Take 3 mg by mouth 2 (two) times daily.   Yes Historical Provider, MD  traZODone (DESYREL) 50 MG tablet Take 50 mg by mouth 3 (three) times daily as needed (anxiety, agitation).   Yes Historical Provider, MD  amLODipine (NORVASC) 5 MG tablet Take 1 tablet (5 mg total) by mouth daily. Patient not taking: Reported on 02/27/2014 01/13/14   Debbe Odea, MD  ciprofloxacin (CIPRO) 500 MG tablet Take 1 tablet (500 mg total) by mouth 2 (two) times daily. Patient not taking: Reported on 10/07/2015 07/06/15   Milton Ferguson, MD  pantoprazole (PROTONIX) 40 MG tablet Take 1 tablet (40 mg total) by mouth 2 (two) times daily before a meal. Patient not taking: Reported on 04/29/2014 04/21/14   [place]  Cletus Gash, MD    Family History Family History  Problem Relation Age of Onset  . Coronary artery disease Father     Starting in his 32's. Died of massive MI at age 80.  . Schizophrenia Brother   . Coronary artery disease Sister     MI at age 80  . Alzheimer's disease Mother     Social History Social History  Substance Use Topics  . Smoking status: Current Some Day Smoker    Packs/day: 1.00    Years: 60.00    Types: Cigarettes, Cigars  . Smokeless tobacco: Never Used     Comment: Since age 41  . Alcohol use Yes     Comment: 174 ml of scotch weekly ---- "I only drink by the drink and speicial occasion/celebrate." 04/17/14     Allergies   Benadryl [diphenhydramine hcl] and Penicillins   Review of Systems Review of Systems  Unable to perform ROS: Dementia    Physical Exam Updated Vital Signs BP 138/63 (BP Location: Left Arm)   Pulse 68   Temp 98 F (36.7 C) (Oral)   Resp 18   SpO2 96%   Physical Exam  Constitutional: He is oriented to person, place, and time. He appears  well-developed and well-nourished.  HENT:  Head: Normocephalic.  Eyes: EOM are normal.  Neck: Normal range of motion.  Pulmonary/Chest: Effort normal.  Abdominal: He exhibits no distension.  Musculoskeletal: Normal range of motion.  Mild erythema and tenderness of the dorsum of right foot with mild pain with ROM of the right ankle FROM of bilateral hips and knees   Neurological: He is alert and oriented to person, place, and time.  Psychiatric: He has a normal mood and affect.  Nursing note and vitals reviewed.    ED Treatments / Results  DIAGNOSTIC STUDIES:  Oxygen Saturation is 96% on RA, normal by my interpretation.    COORDINATION OF CARE:  1:38 AM Discussed treatment plan with pt at bedside and pt agreed to plan.  Labs (all labs ordered are listed, but only abnormal results are displayed) Labs Reviewed - No data to display  EKG  EKG Interpretation None       Radiology Dg Ankle Complete Right  Result Date: 10/07/2015 CLINICAL DATA:  Right foot and ankle pain after fall yesterday. EXAM: RIGHT ANKLE - COMPLETE 3+ VIEW COMPARISON:  None. FINDINGS: There is no evidence of fracture, dislocation, or joint effusion. There is no evidence of arthropathy or other focal bone abnormality. No focal soft tissue abnormality. IMPRESSION: No fracture or dislocation of the right ankle. Electronically Signed   By: Jeb Levering M.D.   On: 10/07/2015 02:47   Dg Foot Complete Right  Result Date: 10/07/2015 CLINICAL DATA:  Right foot and ankle pain after fall yesterday. EXAM: RIGHT FOOT COMPLETE - 3+ VIEW COMPARISON:  None. FINDINGS: There is no evidence of fracture or dislocation. Hammertoe deformity of the digits. Mild proliferative change in the midfoot. Mild dorsal soft tissue edema. IMPRESSION: No acute fracture or subluxation of the right foot. Electronically Signed   By: Jeb Levering M.D.   On: 10/07/2015 02:48    Procedures Procedures (including critical care  time)  Medications Ordered in ED Medications  acetaminophen (TYLENOL) tablet 650 mg (650 mg Oral Given 10/07/15 0254)     Initial Impression / Assessment and Plan / ED Course  I have reviewed the triage vital signs and the nursing notes.  Pertinent labs & imaging results that were available during  my care of the patient were reviewed by me and considered in my medical decision making (see chart for details).  Clinical Course   No signs of infection.  X-rays without acute fracture.  Follow-up with the physician at his facility.  Well-appearing.  Nontoxic.  No C-spine tenderness.  No obvious signs of head trauma.  Chest and abdomen benign.  No thoracic or lumbar tenderness full ROM of bilateral hips, knees, ankles  Final Clinical Impressions(s) / ED Diagnoses   Final diagnoses:  Right foot pain  Fall, initial encounter  Right ankle pain    New Prescriptions Discharge Medication List as of 10/07/2015  3:26 AM      I personally performed the services described in this documentation, which was scribed in my presence. The recorded information has been reviewed and is accurate.        Jola Schmidt, MD 10/08/15 [phone]

## 2015-10-07 NOTE — ED Triage Notes (Signed)
Pt BIB EMS from [facility]; pt found on ground in front of nurses' station; pt states he fell; pt usually walks with walker; A&O to baseline, hx of dementia; no obvious deformities, bruising, or injuries.

## 2015-10-16 ENCOUNTER — Emergency Department (HOSPITAL_COMMUNITY): Payer: Medicare Other

## 2015-10-16 ENCOUNTER — Emergency Department (HOSPITAL_COMMUNITY)
Admission: EM | Admit: 2015-10-16 | Discharge: 2015-10-16 | Disposition: A | Discharge status: Home / Self Care | Payer: Medicare Other | Attending: Emergency Medicine | Admitting: Emergency Medicine

## 2015-10-16 ENCOUNTER — Encounter (HOSPITAL_COMMUNITY): Payer: Self-pay | Admitting: Emergency Medicine

## 2015-10-16 DIAGNOSIS — F0390 Unspecified dementia without behavioral disturbance: Secondary | ICD-10-CM | POA: Insufficient documentation

## 2015-10-16 DIAGNOSIS — Y999 Unspecified external cause status: Secondary | ICD-10-CM | POA: Insufficient documentation

## 2015-10-16 DIAGNOSIS — Z86010 Personal history of colonic polyps: Secondary | ICD-10-CM | POA: Diagnosis not present

## 2015-10-16 DIAGNOSIS — F17210 Nicotine dependence, cigarettes, uncomplicated: Secondary | ICD-10-CM | POA: Insufficient documentation

## 2015-10-16 DIAGNOSIS — R102 Pelvic and perineal pain: Secondary | ICD-10-CM | POA: Diagnosis not present

## 2015-10-16 DIAGNOSIS — I110 Hypertensive heart disease with heart failure: Secondary | ICD-10-CM | POA: Diagnosis not present

## 2015-10-16 DIAGNOSIS — Y939 Activity, unspecified: Secondary | ICD-10-CM | POA: Insufficient documentation

## 2015-10-16 DIAGNOSIS — I509 Heart failure, unspecified: Secondary | ICD-10-CM | POA: Insufficient documentation

## 2015-10-16 DIAGNOSIS — E119 Type 2 diabetes mellitus without complications: Secondary | ICD-10-CM | POA: Insufficient documentation

## 2015-10-16 DIAGNOSIS — R059 Cough, unspecified: Secondary | ICD-10-CM

## 2015-10-16 DIAGNOSIS — Y92129 Unspecified place in nursing home as the place of occurrence of the external cause: Secondary | ICD-10-CM | POA: Insufficient documentation

## 2015-10-16 DIAGNOSIS — M79604 Pain in right leg: Secondary | ICD-10-CM | POA: Diagnosis present

## 2015-10-16 DIAGNOSIS — J449 Chronic obstructive pulmonary disease, unspecified: Secondary | ICD-10-CM | POA: Diagnosis not present

## 2015-10-16 DIAGNOSIS — W19XXXA Unspecified fall, initial encounter: Secondary | ICD-10-CM | POA: Insufficient documentation

## 2015-10-16 DIAGNOSIS — R05 Cough: Secondary | ICD-10-CM | POA: Insufficient documentation

## 2015-10-16 LAB — I-STAT CHEM 8, ED
BUN: 28 mg/dL — AB (ref 6–20)
CALCIUM ION: 1.22 mmol/L (ref 1.15–1.40)
Chloride: 104 mmol/L (ref 101–111)
Creatinine, Ser: 1 mg/dL (ref 0.61–1.24)
Glucose, Bld: 77 mg/dL (ref 65–99)
HCT: 38 % — ABNORMAL LOW (ref 39.0–52.0)
Hemoglobin: 12.9 g/dL — ABNORMAL LOW (ref 13.0–17.0)
Potassium: 4.4 mmol/L (ref 3.5–5.1)
SODIUM: 141 mmol/L (ref 135–145)
TCO2: 25 mmol/L (ref 0–100)

## 2015-10-16 LAB — URINALYSIS, ROUTINE W REFLEX MICROSCOPIC
BILIRUBIN URINE: NEGATIVE
Glucose, UA: NEGATIVE mg/dL
Hgb urine dipstick: NEGATIVE
KETONES UR: 15 mg/dL — AB
Leukocytes, UA: NEGATIVE
NITRITE: NEGATIVE
PH: 6.5 (ref 5.0–8.0)
Protein, ur: NEGATIVE mg/dL
Specific Gravity, Urine: 1.021 (ref 1.005–1.030)

## 2015-10-16 NOTE — ED Triage Notes (Signed)
Pt in from [facility] via [facility] after being found down by staff. Probable fall per SNF staff, freq falls noted. Pt c/o R upper leg pain, no obvious deformity. Pt does not take blood thinners. Per EMS, pt has dementia and is currently at baseline. Alert to self and place.

## 2015-10-16 NOTE — ED Provider Notes (Signed)
[facility] DEPT Provider Note   CSN: [id number] Arrival date & time: 10/16/15  0747     History   Chief Complaint Chief Complaint  Patient presents with  . Fall    HPI Dale Mcfarland is a 82 y.o. male.  82 year old male presents from nursing home after being found on the ground. Patient has a history of frequent falls. Does have history of dementia which limits this history. Complaint of having some pain to his right leg. No obvious injuries were noted. Patient at baseline per EMS. Patient transported by EMS.      Past Medical History:  Diagnosis Date  . Anemia    In the setting of UGI bleed 01/2011 requiring blood transfusion  . Bronchitis   . Chronic back pain   . Collar bone fracture    left  . COPD (chronic obstructive pulmonary disease) ([place])   . Depression   . Diabetes mellitus without complication ([place])   . Dyspnea    Chronic, thought due to COPD. Echo 02/2010 with EF 30-35%, nuclear study negative, then repeat echo 03/2011 did not show systolic dysfxn, so unclear if truly HF  . Homelessness   . Hypertension   . Hyponatremia    Previously felt secondary to SIADH  . Pneumonia    01/2011 - CAP vs aspiration pneuonmia  . PONV (postoperative nausea and vomiting)   . Upper GI bleed    01/2011 with EGD showing severe candida esophagitis and duodenal bulb erosion    Patient Active Problem List   Diagnosis Date Noted  . History of colonic polyps   . Benign neoplasm of ascending colon   . Benign neoplasm of transverse colon   . Benign neoplasm of descending colon   . Benign neoplasm of sigmoid colon   . Hematemesis without nausea   . Acute esophagitis   . Duodenitis   . Bleeding gastrointestinal   . Esophagitis   . COLD (chronic obstructive lung disease) ([place])   . Essential hypertension   . Alcohol abuse   . Protein-calorie malnutrition, severe ([place]) 02/19/2014  . Atrial flutter with rapid ventricular response ([place]) 02/16/2014  . Generalized abdominal  pain   . Acute alcoholic pancreatitis [id number]  . Diabetes mellitus without complication ([place]) [id number]  . CAP (community acquired pneumonia) 01/09/2014  . Atrial fibrillation with rapid ventricular response ([place]) 07/04/2013  . A-fib ([place]) 07/04/2013  . COPD exacerbation ([place]) 06/15/2013  . Community acquired pneumonia 06/15/2013  . Atrial fibrillation with RVR ([place]) 03/03/2013  . CHF exacerbation ([place]) 03/02/2013  . Normocytic anemia 03/01/2013  . Long QT interval 03/01/2013  . Influenza due to identified novel influenza A virus with other respiratory manifestations 03/01/2013  . GI bleed 01/06/2013  . Reflux esophagitis 01/06/2013  . Esophageal mass 12/03/2012  . Rib fractures 11/22/2012  . Elbow fracture, right 11/22/2012  . Benign neoplasm of colon 01/30/2012  . Heme positive stool 01/29/2012  . Dyspnea on exertion 01/29/2012  . Iron deficiency anemia, unspecified 01/29/2012  . Homelessness 05/04/2011  . CHF (congestive heart failure) ([place]) 03/28/2011  . COPD (chronic obstructive pulmonary disease) ([place]) 03/28/2011  . Tobacco abuse 03/28/2011  . Shortness of breath 03/28/2011  . Dehydration 02/27/2011  . Syncope 02/27/2011  . Pneumonia 02/02/2011  . Upper GI bleeding 02/02/2011  . Alcoholism ([place]) 02/02/2011  . Leukocytosis 02/02/2011  . Anemia associated with acute blood loss 02/02/2011    Past Surgical History:  Procedure Laterality Date  . APPENDECTOMY    . COLONOSCOPY    .  COLONOSCOPY  01/30/2012   Procedure: COLONOSCOPY;  Surgeon: Ladene Artist, MD,FACG;  Location: [facility] ENDOSCOPY;  Service: Endoscopy;  Laterality: N/A;  . COLONOSCOPY N/A 04/20/2014   Procedure: COLONOSCOPY;  Surgeon: Jerene Bears, MD;  Location: [facility] ENDOSCOPY;  Service: Endoscopy;  Laterality: N/A;  . ESOPHAGOGASTRODUODENOSCOPY    . ESOPHAGOGASTRODUODENOSCOPY  02/03/2011   Procedure: ESOPHAGOGASTRODUODENOSCOPY (EGD);  Surgeon: Juanita Craver, MD;  Location: [facility] ENDOSCOPY;  Service: Endoscopy;   Laterality: N/A;  . ESOPHAGOGASTRODUODENOSCOPY N/A 04/20/2014   Procedure: ESOPHAGOGASTRODUODENOSCOPY (EGD);  Surgeon: Jerene Bears, MD;  Location[facility] ENDOSCOPY;  Service: Endoscopy;  Laterality: N/A;  . TONSILLECTOMY    . TONSILLECTOMY    . VASECTOMY         Home Medications    Prior to Admission medications   Medication Sig Start Date End Date Taking? Authorizing Provider  acetaminophen (TYLENOL) 650 MG CR tablet Take 650 mg by mouth every 8 (eight) hours as needed for pain.    Historical Provider, MD  amLODipine (NORVASC) 5 MG tablet Take 1 tablet (5 mg total) by mouth daily. Patient not taking: Reported on 02/27/2014 01/13/14   Debbe Odea, MD  ciprofloxacin (CIPRO) 500 MG tablet Take 1 tablet (500 mg total) by mouth 2 (two) times daily. Patient not taking: Reported on 10/07/2015 07/06/15   Milton Ferguson, MD  divalproex (DEPAKOTE SPRINKLE) 125 MG capsule Take 125 mg by mouth 3 (three) times daily.     Historical Provider, MD  docusate sodium (COLACE) 100 MG capsule Take 100 mg by mouth at bedtime as needed for mild constipation.     Historical Provider, MD  ferrous sulfate 325 (65 FE) MG tablet Take 325 mg by mouth daily with breakfast.    Historical Provider, MD  hydrocortisone cream 1 % Apply 1 application topically daily as needed for itching.    Historical Provider, MD  LORazepam (ATIVAN) 1 MG tablet Take 1 mg by mouth 2 (two) times daily as needed (agitation).    Historical Provider, MD  magnesium hydroxide (MILK OF MAGNESIA) 400 MG/5ML suspension Take 30 mLs by mouth daily as needed for mild constipation.    Historical Provider, MD  mirtazapine (REMERON) 7.5 MG tablet Take 7.5 mg by mouth at bedtime.    Historical Provider, MD  Multiple Vitamin (DAILY VITE PO) Take 1 tablet by mouth daily with breakfast.     Historical Provider, MD  pantoprazole (PROTONIX) 40 MG tablet Take 1 tablet (40 mg total) by mouth 2 (two) times daily before a meal. Patient not taking: Reported on 04/29/2014  04/21/14   Elberta Leatherwood, MD  risperiDONE (RISPERDAL) 0.5 MG tablet Take 0.5 mg by mouth at bedtime. 09/12/15   Historical Provider, MD  rivastigmine (EXELON) 3 MG capsule Take 3 mg by mouth 2 (two) times daily.    Historical Provider, MD  traZODone (DESYREL) 50 MG tablet Take 50 mg by mouth 3 (three) times daily as needed (anxiety, agitation).    Historical Provider, MD    Family History Family History  Problem Relation Age of Onset  . Coronary artery disease Father     Starting in his 78's. Died of massive MI at age 60.  . Schizophrenia Brother   . Coronary artery disease Sister     MI at age 48  . Alzheimer's disease Mother     Social History Social History  Substance Use Topics  . Smoking status: Current Some Day Smoker    Packs/day: 1.00    Years: 60.00  Types: Cigarettes, Cigars  . Smokeless tobacco: Never Used     Comment: Since age 42  . Alcohol use Yes     Comment: 174 ml of scotch weekly ---- "I only drink by the drink and speicial occasion/celebrate." 04/17/14     Allergies   Benadryl [diphenhydramine hcl] and Penicillins   Review of Systems Review of Systems  Unable to perform ROS: Dementia     Physical Exam Updated Vital Signs BP 182/80   Pulse (!) 52   Temp 98.4 F (36.9 C) (Oral)   Resp 21   SpO2 100%   Physical Exam  Constitutional: He appears well-developed and well-nourished.  Non-toxic appearance. No distress.  HENT:  Head: Normocephalic and atraumatic.  Eyes: Conjunctivae, EOM and lids are normal. Pupils are equal, round, and reactive to light.  Neck: Normal range of motion. Neck supple. No tracheal deviation present. No thyroid mass present.  Cardiovascular: Normal rate, regular rhythm and normal heart sounds.  Exam reveals no gallop.   No murmur heard. Pulmonary/Chest: Effort normal. No stridor. No respiratory distress. He has decreased breath sounds. He has no wheezes. He has rhonchi in the right lower field and the left lower field. He has  no rales.  Abdominal: Soft. Normal appearance and bowel sounds are normal. He exhibits no distension. There is no tenderness. There is no rebound and no CVA tenderness.  Musculoskeletal: Normal range of motion. He exhibits no edema or tenderness.  Neurological: He is alert. He is disoriented. He displays atrophy. No cranial nerve deficit or sensory deficit. GCS eye subscore is 4. GCS verbal subscore is 4. GCS motor subscore is 6.  Skin: Skin is warm and dry. No abrasion and no rash noted.  Psychiatric: His affect is blunt.  Nursing note and vitals reviewed.    ED Treatments / Results  Labs (all labs ordered are listed, but only abnormal results are displayed) Labs Reviewed  URINE CULTURE  URINALYSIS, ROUTINE W REFLEX MICROSCOPIC (NOT AT [facility])  I-STAT CHEM 8, ED    EKG  EKG Interpretation None       Radiology No results found.  Procedures Procedures (including critical care time)  Medications Ordered in ED Medications - No data to display   Initial Impression / Assessment and Plan / ED Course  I have reviewed the triage vital signs and the nursing notes.  Pertinent labs & imaging results that were available during my care of the patient were reviewed by me and considered in my medical decision making (see chart for details).  Clinical Course    X-rays and lab work without acute findings. Stable for discharge back to nursing home  Final Clinical Impressions(s) / ED Diagnoses   Final diagnoses:  Cough    New Prescriptions New Prescriptions   No medications on file     Lacretia Leigh, MD 10/16/15 1119

## 2015-10-17 LAB — URINE CULTURE: CULTURE: NO GROWTH

## 2015-10-19 ENCOUNTER — Emergency Department (HOSPITAL_COMMUNITY): Payer: Medicare Other

## 2015-10-19 ENCOUNTER — Emergency Department (HOSPITAL_COMMUNITY)
Admission: EM | Admit: 2015-10-19 | Discharge: 2015-10-19 | Disposition: A | Discharge status: Home / Self Care | Payer: Medicare Other | Attending: Emergency Medicine | Admitting: Emergency Medicine

## 2015-10-19 ENCOUNTER — Encounter (HOSPITAL_COMMUNITY): Payer: Self-pay | Admitting: *Deleted

## 2015-10-19 DIAGNOSIS — I509 Heart failure, unspecified: Secondary | ICD-10-CM | POA: Diagnosis not present

## 2015-10-19 DIAGNOSIS — Y9289 Other specified places as the place of occurrence of the external cause: Secondary | ICD-10-CM | POA: Diagnosis not present

## 2015-10-19 DIAGNOSIS — I110 Hypertensive heart disease with heart failure: Secondary | ICD-10-CM | POA: Insufficient documentation

## 2015-10-19 DIAGNOSIS — J449 Chronic obstructive pulmonary disease, unspecified: Secondary | ICD-10-CM | POA: Diagnosis not present

## 2015-10-19 DIAGNOSIS — M25552 Pain in left hip: Secondary | ICD-10-CM | POA: Diagnosis present

## 2015-10-19 DIAGNOSIS — F17210 Nicotine dependence, cigarettes, uncomplicated: Secondary | ICD-10-CM | POA: Insufficient documentation

## 2015-10-19 DIAGNOSIS — Z79899 Other long term (current) drug therapy: Secondary | ICD-10-CM | POA: Insufficient documentation

## 2015-10-19 DIAGNOSIS — F0390 Unspecified dementia without behavioral disturbance: Secondary | ICD-10-CM | POA: Insufficient documentation

## 2015-10-19 DIAGNOSIS — Y939 Activity, unspecified: Secondary | ICD-10-CM | POA: Diagnosis not present

## 2015-10-19 DIAGNOSIS — M25551 Pain in right hip: Secondary | ICD-10-CM | POA: Diagnosis not present

## 2015-10-19 DIAGNOSIS — W19XXXA Unspecified fall, initial encounter: Secondary | ICD-10-CM | POA: Diagnosis not present

## 2015-10-19 DIAGNOSIS — E119 Type 2 diabetes mellitus without complications: Secondary | ICD-10-CM | POA: Insufficient documentation

## 2015-10-19 DIAGNOSIS — Y999 Unspecified external cause status: Secondary | ICD-10-CM | POA: Insufficient documentation

## 2015-10-19 HISTORY — DX: Unspecified atrial fibrillation: I48.91

## 2015-10-19 HISTORY — DX: Acute pancreatitis without necrosis or infection, unspecified: K85.90

## 2015-10-19 NOTE — ED Triage Notes (Signed)
Per EMS - patient comes from [facility] unit on [address] after being found on the floor in his doorway this morning.  Patient has hx of falls and has been seen in our system several times for same over past few weeks.  Per staff he is alert and oriented to baseline with underlying dementia.  Patient c/o bilateral hip pain, but no instability of pelvic girdle.  Patient's vitals 166/78, HR 56, RR 18, CBG 108.

## 2015-10-19 NOTE — ED Notes (Signed)
Bed: [id number] Expected date:  Expected time:  Means of arrival:  Comments: EMS 82 yo M, fall hip pain

## 2015-10-25 NOTE — ED Provider Notes (Signed)
[facility] DE[facility] Provider Note   CSN: [id number] Arrival date & time: 10/19/15  0825     History   Chief Complaint Chief Complaint  Patient presents with  . Fall    HPI Dale Mcfarland is a 82 y.o. male.  HPI  29yM  from [facility] unit on [facility] after being found on the floor in his doorway this morning.  Patient has hx of falls and has been seen in our system several times for same over past few weeks.  Per staff he is alert and oriented to baseline with underlying dementia. He responds to questioning but really isnt able to add much useful information to HPI other than denying pain.   Past Medical History:  Diagnosis Date  . Anemia    In the setting of UGI bleed 01/2011 requiring blood transfusion  . Atrial fibrillation ([place])   . Bronchitis   . Chronic back pain   . Collar bone fracture    left  . COPD (chronic obstructive pulmonary disease) ([place])   . Depression   . Diabetes mellitus without complication ([place])   . Dyspnea    Chronic, thought due to COPD. Echo 02/2010 with EF 30-35%, nuclear study negative, then repeat echo 03/2011 did not show systolic dysfxn, so unclear if truly HF  . Homelessness   . Hypertension   . Hyponatremia    Previously felt secondary to SIADH  . Pancreatitis   . Pneumonia    01/2011 - CAP vs aspiration pneuonmia  . PONV (postoperative nausea and vomiting)   . Upper GI bleed    01/2011 with EGD showing severe candida esophagitis and duodenal bulb erosion    Patient Active Problem List   Diagnosis Date Noted  . History of colonic polyps   . Benign neoplasm of ascending colon   . Benign neoplasm of transverse colon   . Benign neoplasm of descending colon   . Benign neoplasm of sigmoid colon   . Hematemesis without nausea   . Acute esophagitis   . Duodenitis   . Bleeding gastrointestinal   . Esophagitis   . COLD (chronic obstructive lung disease) ([place])   . Essential hypertension   . Alcohol abuse   .  Protein-calorie malnutrition, severe ([place]) 02/19/2014  . Atrial flutter with rapid ventricular response ([place]) 02/16/2014  . Generalized abdominal pain   . Acute alcoholic pancreatitis [id number]  . Diabetes mellitus without complication ([place]) [id number]  . CAP (community acquired pneumonia) 01/09/2014  . Atrial fibrillation with rapid ventricular response ([place]) 07/04/2013  . A-fib ([place]) 07/04/2013  . COPD exacerbation ([place]) 06/15/2013  . Community acquired pneumonia 06/15/2013  . Atrial fibrillation with RVR ([place]) 03/03/2013  . CHF exacerbation ([place]) 03/02/2013  . Normocytic anemia 03/01/2013  . Long QT interval 03/01/2013  . Influenza due to identified novel influenza A virus with other respiratory manifestations 03/01/2013  . GI bleed 01/06/2013  . Reflux esophagitis 01/06/2013  . Esophageal mass 12/03/2012  . Rib fractures 11/22/2012  . Elbow fracture, right 11/22/2012  . Benign neoplasm of colon 01/30/2012  . Heme positive stool 01/29/2012  . Dyspnea on exertion 01/29/2012  . Iron deficiency anemia, unspecified 01/29/2012  . Homelessness 05/04/2011  . CHF (congestive heart failure) ([place]) 03/28/2011  . COPD (chronic obstructive pulmonary disease) ([place]) 03/28/2011  . Tobacco abuse 03/28/2011  . Shortness of breath 03/28/2011  . Dehydration 02/27/2011  . Syncope 02/27/2011  . Pneumonia 02/02/2011  . Upper GI bleeding 02/02/2011  . Alcoholism ([place]) 02/02/2011  .  Leukocytosis 02/02/2011  . Anemia associated with acute blood loss 02/02/2011    Past Surgical History:  Procedure Laterality Date  . APPENDECTOMY    . COLONOSCOPY    . COLONOSCOPY  01/30/2012   Procedure: COLONOSCOPY;  Surgeon: Ladene Artist, MD,FACG;  Location: [facility] ENDOSCOPY;  Service: Endoscopy;  Laterality: N/A;  . COLONOSCOPY N/A 04/20/2014   Procedure: COLONOSCOPY;  Surgeon: Jerene Bears, MD;  Location: [facility] ENDOSCOPY;  Service: Endoscopy;  Laterality: N/A;  . ESOPHAGOGASTRODUODENOSCOPY    .  ESOPHAGOGASTRODUODENOSCOPY  02/03/2011   Procedure: ESOPHAGOGASTRODUODENOSCOPY (EGD);  Surgeon: Juanita Craver, MD;  Location: [facility] ENDOSCOPY;  Service: Endoscopy;  Laterality: N/A;  . ESOPHAGOGASTRODUODENOSCOPY N/A 04/20/2014   Procedure: ESOPHAGOGASTRODUODENOSCOPY (EGD);  Surgeon: Jerene Bears, MD;  Location: [facility] ENDOSCOPY;  Service: Endoscopy;  Laterality: N/A;  . TONSILLECTOMY    . TONSILLECTOMY    . VASECTOMY         Home Medications    Prior to Admission medications   Medication Sig Start Date End Date Taking? Authorizing Provider  acetaminophen (TYLENOL) 650 MG CR tablet Take 650 mg by mouth every 8 (eight) hours as needed for pain.   Yes Historical Provider, MD  divalproex (DEPAKOTE SPRINKLE) 125 MG capsule Take 125 mg by mouth 3 (three) times daily.    Yes Historical Provider, MD  docusate sodium (COLACE) 100 MG capsule Take 100 mg by mouth at bedtime as needed for mild constipation.    Yes Historical Provider, MD  ferrous sulfate 325 (65 FE) MG tablet Take 325 mg by mouth daily with breakfast.   Yes Historical Provider, MD  hydrocortisone cream 1 % Apply 1 application topically daily as needed for itching.   Yes Historical Provider, MD  LORazepam (ATIVAN) 1 MG tablet Take 1 mg by mouth 2 (two) times daily as needed (agitation).   Yes Historical Provider, MD  magnesium hydroxide (MILK OF MAGNESIA) 400 MG/5ML suspension Take 30 mLs by mouth daily as needed for mild constipation.   Yes Historical Provider, MD  mirtazapine (REMERON) 7.5 MG tablet Take 7.5 mg by mouth at bedtime.   Yes Historical Provider, MD  Multiple Vitamin (DAILY VITE PO) Take 1 tablet by mouth daily with breakfast.    Yes Historical Provider, MD  risperiDONE (RISPERDAL) 0.5 MG tablet Take 0.5 mg by mouth at bedtime. 09/12/15  Yes Historical Provider, MD  rivastigmine (EXELON) 3 MG capsule Take 3 mg by mouth 2 (two) times daily.   Yes Historical Provider, MD  traZODone (DESYREL) 50 MG tablet Take 50 mg by mouth 3 (three)  times daily as needed (anxiety, agitation).   Yes Historical Provider, MD  amLODipine (NORVASC) 5 MG tablet Take 1 tablet (5 mg total) by mouth daily. Patient not taking: Reported on 02/27/2014 01/13/14   Debbe Odea, MD  ciprofloxacin (CIPRO) 500 MG tablet Take 1 tablet (500 mg total) by mouth 2 (two) times daily. Patient not taking: Reported on 10/07/2015 07/06/15   Milton Ferguson, MD  pantoprazole (PROTONIX) 40 MG tablet Take 1 tablet (40 mg total) by mouth 2 (two) times daily before a meal. Patient not taking: Reported on 04/29/2014 04/21/14   Elberta Leatherwood, MD    Family History Family History  Problem Relation Age of Onset  . Coronary artery disease Father     Starting in his 68's. Died of massive MI at age 70.  . Schizophrenia Brother   . Coronary artery disease Sister     MI at age 15  . Alzheimer's disease  Mother     Social History Social History  Substance Use Topics  . Smoking status: Current Some Day Smoker    Packs/day: 1.00    Years: 60.00    Types: Cigarettes, Cigars  . Smokeless tobacco: Never Used     Comment: Since age 31  . Alcohol use Yes     Comment: 174 ml of scotch weekly ---- "I only drink by the drink and speicial occasion/celebrate." 04/17/14     Allergies   Benadryl [diphenhydramine hcl] and Penicillins   Review of Systems Review of Systems  Level 5 caveat because of dementia  Physical Exam Updated Vital Signs BP 172/84 (BP Location: Right Arm)   Pulse 65   Temp 97.8 F (36.6 C) (Oral)   Resp 15   SpO2 100%   Physical Exam  Constitutional: He appears well-developed and well-nourished. No distress.  HENT:  Head: Normocephalic and atraumatic.  Eyes: Conjunctivae are normal. Right eye exhibits no discharge. Left eye exhibits no discharge.  Neck: Neck supple.  Cardiovascular: Normal rate, regular rhythm and normal heart sounds.  Exam reveals no gallop and no friction rub.   No murmur heard. Pulmonary/Chest: Effort normal and breath sounds  normal. No respiratory distress.  Abdominal: Soft. He exhibits no distension. There is no tenderness.  Musculoskeletal: He exhibits no edema or tenderness.  No apparent pain with palpation of extremities and ROM of large joints. No midline spinal tenderness.   Neurological: He is alert.  Speech slow but clear. Mostly responding yes/no. No focal motor deficit.   Skin: Skin is warm and dry.  Nursing note and vitals reviewed.    ED Treatments / Results  Labs (all labs ordered are listed, but only abnormal results are displayed) Labs Reviewed - No data to display  EKG  EKG Interpretation  Date/Time:  Thursday October 19 2015 09:14:54 EDT Ventricular Rate:  54 PR Interval:    QRS Duration: 95 QT Interval:  453 QTC Calculation: 430 R Axis:   47 Text Interpretation:  Sinus rhythm Borderline T abnormalities, inferior leads Confirmed by Wilson Singer  MD, Briann Sarchet [id number]) on 10/19/2015 10:19:37 AM       Radiology No results found.  Procedures Procedures (including critical care time)  Medications Ordered in ED Medications - No data to display   Initial Impression / Assessment and Plan / ED Course  I have reviewed the triage vital signs and the nursing notes.  Pertinent labs & imaging results that were available during my care of the patient were reviewed by me and considered in my medical decision making (see chart for details).  Clinical Course    86yM presenting after fall. He appears generally well. He has no complaints. Reportedly at his baseline. HD stable. No external signs of trauma and reassuring exam. It has been determined that no acute conditions requiring further emergency intervention are present at this time. The patient has been advised of the diagnosis and plan. I reviewed any labs and imaging including any potential incidental findings. We have discussed signs and symptoms that warrant return to the ED and they are listed in the discharge instructions.    Final  Clinical Impressions(s) / ED Diagnoses   Final diagnoses:  Fall, initial encounter    New Prescriptions Discharge Medication List as of 10/19/2015 10:24 AM       Virgel Manifold, MD 10/25/15 1413

## 2015-11-06 ENCOUNTER — Telehealth (HOSPITAL_BASED_OUTPATIENT_CLINIC_OR_DEPARTMENT_OTHER): Payer: Self-pay | Admitting: Emergency Medicine

## 2015-12-02 ENCOUNTER — Emergency Department (HOSPITAL_COMMUNITY)

## 2015-12-02 ENCOUNTER — Encounter (HOSPITAL_COMMUNITY): Payer: Self-pay

## 2015-12-02 ENCOUNTER — Emergency Department (HOSPITAL_COMMUNITY)
Admission: EM | Admit: 2015-12-02 | Discharge: 2015-12-02 | Disposition: A | Discharge status: Home / Self Care | Attending: Emergency Medicine | Admitting: Emergency Medicine

## 2015-12-02 DIAGNOSIS — F0390 Unspecified dementia without behavioral disturbance: Secondary | ICD-10-CM | POA: Insufficient documentation

## 2015-12-02 DIAGNOSIS — J449 Chronic obstructive pulmonary disease, unspecified: Secondary | ICD-10-CM | POA: Diagnosis not present

## 2015-12-02 DIAGNOSIS — Z85038 Personal history of other malignant neoplasm of large intestine: Secondary | ICD-10-CM | POA: Diagnosis not present

## 2015-12-02 DIAGNOSIS — Y92129 Unspecified place in nursing home as the place of occurrence of the external cause: Secondary | ICD-10-CM | POA: Insufficient documentation

## 2015-12-02 DIAGNOSIS — Y939 Activity, unspecified: Secondary | ICD-10-CM | POA: Diagnosis not present

## 2015-12-02 DIAGNOSIS — F17210 Nicotine dependence, cigarettes, uncomplicated: Secondary | ICD-10-CM | POA: Insufficient documentation

## 2015-12-02 DIAGNOSIS — I110 Hypertensive heart disease with heart failure: Secondary | ICD-10-CM | POA: Diagnosis not present

## 2015-12-02 DIAGNOSIS — Y999 Unspecified external cause status: Secondary | ICD-10-CM | POA: Diagnosis not present

## 2015-12-02 DIAGNOSIS — S51012A Laceration without foreign body of left elbow, initial encounter: Secondary | ICD-10-CM | POA: Insufficient documentation

## 2015-12-02 DIAGNOSIS — W050XXA Fall from non-moving wheelchair, initial encounter: Secondary | ICD-10-CM | POA: Diagnosis not present

## 2015-12-02 DIAGNOSIS — S59902A Unspecified injury of left elbow, initial encounter: Secondary | ICD-10-CM | POA: Diagnosis present

## 2015-12-02 DIAGNOSIS — E119 Type 2 diabetes mellitus without complications: Secondary | ICD-10-CM | POA: Insufficient documentation

## 2015-12-02 DIAGNOSIS — I509 Heart failure, unspecified: Secondary | ICD-10-CM | POA: Insufficient documentation

## 2015-12-02 DIAGNOSIS — W19XXXA Unspecified fall, initial encounter: Secondary | ICD-10-CM

## 2015-12-02 DIAGNOSIS — M7989 Other specified soft tissue disorders: Secondary | ICD-10-CM | POA: Diagnosis not present

## 2015-12-02 MED ORDER — BACITRACIN ZINC 500 UNIT/GM EX OINT: 1.0000 "application " | TOPICAL_OINTMENT | Freq: Two times a day (BID) | CUTANEOUS | Status: DC

## 2015-12-02 NOTE — ED Notes (Signed)
Kallie Edward, RN, advised she will call facility to give report.

## 2015-12-02 NOTE — ED Provider Notes (Signed)
[facility] DEPT Provider Note   CSN: [id number] Arrival date & time: 12/02/15  1353     History   Chief Complaint Chief Complaint  Patient presents with  . Fall    HPI Dale Mcfarland is a 82 y.o. male.  82 year old male with past medical history including COPD, atrial fibrillation, dementia who presents with fall. EMS reports that the patient fell out of his wheelchair at his nursing home today. He is not able to recall details of the fall. He reports right foot and ankle pain. He denies any head, neck, back, chest, or abdominal pain. No hip pain.  LEVEL 5 CAVEAT DUE TO DEMENTIA   The history is provided by the patient and the EMS personnel.  Fall     Past Medical History:  Diagnosis Date  . Anemia    In the setting of UGI bleed 01/2011 requiring blood transfusion  . Atrial fibrillation ([place])   . Bronchitis   . Chronic back pain   . Collar bone fracture    left  . COPD (chronic obstructive pulmonary disease) ([place])   . Depression   . Diabetes mellitus without complication ([place])   . Dyspnea    Chronic, thought due to COPD. Echo 02/2010 with EF 30-35%, nuclear study negative, then repeat echo 03/2011 did not show systolic dysfxn, so unclear if truly HF  . Homelessness   . Hypertension   . Hyponatremia    Previously felt secondary to SIADH  . Pancreatitis   . Pneumonia    01/2011 - CAP vs aspiration pneuonmia  . PONV (postoperative nausea and vomiting)   . Upper GI bleed    01/2011 with EGD showing severe candida esophagitis and duodenal bulb erosion    Patient Active Problem List   Diagnosis Date Noted  . History of colonic polyps   . Benign neoplasm of ascending colon   . Benign neoplasm of transverse colon   . Benign neoplasm of descending colon   . Benign neoplasm of sigmoid colon   . Hematemesis without nausea   . Acute esophagitis   . Duodenitis   . Bleeding gastrointestinal   . Esophagitis   . COLD (chronic obstructive lung disease) ([place])   .  Essential hypertension   . Alcohol abuse   . Protein-calorie malnutrition, severe ([place]) 02/19/2014  . Atrial flutter with rapid ventricular response ([place]) 02/16/2014  . Generalized abdominal pain   . Acute alcoholic pancreatitis [id number]  . Diabetes mellitus without complication ([place]) [id number]  . CAP (community acquired pneumonia) 01/09/2014  . Atrial fibrillation with rapid ventricular response ([place]) 07/04/2013  . A-fib ([place]) 07/04/2013  . COPD exacerbation ([place]) 06/15/2013  . Community acquired pneumonia 06/15/2013  . Atrial fibrillation with RVR ([place]) 03/03/2013  . CHF exacerbation ([place]) 03/02/2013  . Normocytic anemia 03/01/2013  . Long QT interval 03/01/2013  . Influenza due to identified novel influenza A virus with other respiratory manifestations 03/01/2013  . GI bleed 01/06/2013  . Reflux esophagitis 01/06/2013  . Esophageal mass 12/03/2012  . Rib fractures 11/22/2012  . Elbow fracture, right 11/22/2012  . Benign neoplasm of colon 01/30/2012  . Heme positive stool 01/29/2012  . Dyspnea on exertion 01/29/2012  . Iron deficiency anemia, unspecified 01/29/2012  . Homelessness 05/04/2011  . CHF (congestive heart failure) ([place]) 03/28/2011  . COPD (chronic obstructive pulmonary disease) ([place]) 03/28/2011  . Tobacco abuse 03/28/2011  . Shortness of breath 03/28/2011  . Dehydration 02/27/2011  . Syncope 02/27/2011  . Pneumonia 02/02/2011  . Upper GI  bleeding 02/02/2011  . Alcoholism ([place]) 02/02/2011  . Leukocytosis 02/02/2011  . Anemia associated with acute blood loss 02/02/2011    Past Surgical History:  Procedure Laterality Date  . APPENDECTOMY    . COLONOSCOPY    . COLONOSCOPY  01/30/2012   Procedure: COLONOSCOPY;  Surgeon: Ladene Artist, MD,FACG;  Location: [facility] ENDOSCOPY;  Service: Endoscopy;  Laterality: N/A;  . COLONOSCOPY N/A 04/20/2014   Procedure: COLONOSCOPY;  Surgeon: Jerene Bears, MD;  Location[facility] ENDOSCOPY;  Service: Endoscopy;  Laterality: N/A;  .  ESOPHAGOGASTRODUODENOSCOPY    . ESOPHAGOGASTRODUODENOSCOPY  02/03/2011   Procedure: ESOPHAGOGASTRODUODENOSCOPY (EGD);  Surgeon: Juanita Craver, MD;  Location: [facility] ENDOSCOPY;  Service: Endoscopy;  Laterality: N/A;  . ESOPHAGOGASTRODUODENOSCOPY N/A 04/20/2014   Procedure: ESOPHAGOGASTRODUODENOSCOPY (EGD);  Surgeon: Jerene Bears, MD;  Location: [facility] ENDOSCOPY;  Service: Endoscopy;  Laterality: N/A;  . TONSILLECTOMY    . TONSILLECTOMY    . VASECTOMY         Home Medications    Prior to Admission medications   Medication Sig Start Date End Date Taking? Authorizing Provider  acetaminophen (TYLENOL) 650 MG CR tablet Take 650 mg by mouth every 8 (eight) hours as needed for pain.    Historical Provider, MD  amLODipine (NORVASC) 5 MG tablet Take 1 tablet (5 mg total) by mouth daily. Patient not taking: Reported on 02/27/2014 01/13/14   Debbe Odea, MD  ciprofloxacin (CIPRO) 500 MG tablet Take 1 tablet (500 mg total) by mouth 2 (two) times daily. Patient not taking: Reported on 10/07/2015 07/06/15   Milton Ferguson, MD  divalproex (DEPAKOTE SPRINKLE) 125 MG capsule Take 125 mg by mouth 3 (three) times daily.     Historical Provider, MD  docusate sodium (COLACE) 100 MG capsule Take 100 mg by mouth at bedtime as needed for mild constipation.     Historical Provider, MD  ferrous sulfate 325 (65 FE) MG tablet Take 325 mg by mouth daily with breakfast.    Historical Provider, MD  hydrocortisone cream 1 % Apply 1 application topically daily as needed for itching.    Historical Provider, MD  LORazepam (ATIVAN) 1 MG tablet Take 1 mg by mouth 2 (two) times daily as needed (agitation).    Historical Provider, MD  magnesium hydroxide (MILK OF MAGNESIA) 400 MG/5ML suspension Take 30 mLs by mouth daily as needed for mild constipation.    Historical Provider, MD  mirtazapine (REMERON) 7.5 MG tablet Take 7.5 mg by mouth at bedtime.    Historical Provider, MD  Multiple Vitamin (DAILY VITE PO) Take 1 tablet by mouth daily with  breakfast.     Historical Provider, MD  pantoprazole (PROTONIX) 40 MG tablet Take 1 tablet (40 mg total) by mouth 2 (two) times daily before a meal. Patient not taking: Reported on 04/29/2014 04/21/14   Elberta Leatherwood, MD  risperiDONE (RISPERDAL) 0.5 MG tablet Take 0.5 mg by mouth at bedtime. 09/12/15   Historical Provider, MD  rivastigmine (EXELON) 3 MG capsule Take 3 mg by mouth 2 (two) times daily.    Historical Provider, MD  traZODone (DESYREL) 50 MG tablet Take 50 mg by mouth 3 (three) times daily as needed (anxiety, agitation).    Historical Provider, MD    Family History Family History  Problem Relation Age of Onset  . Coronary artery disease Father     Starting in his 84's. Died of massive MI at age 29.  . Schizophrenia Brother   . Coronary artery disease Sister  MI at age 25  . Alzheimer's disease Mother     Social History Social History  Substance Use Topics  . Smoking status: Current Some Day Smoker    Packs/day: 1.00    Years: 60.00    Types: Cigarettes, Cigars  . Smokeless tobacco: Never Used     Comment: Since age 24  . Alcohol use Yes     Comment: 174 ml of scotch weekly ---- "I only drink by the drink and speicial occasion/celebrate." 04/17/14     Allergies   Benadryl [diphenhydramine hcl] and Penicillins   Review of Systems Review of Systems  Unable to perform ROS: Dementia     Physical Exam Updated Vital Signs BP 163/82   Temp 97.7 F (36.5 C) (Oral)   Resp 13   Ht 6' (1.829 m)   Wt 130 lb (59 kg)   BMI 17.63 kg/m   Physical Exam  Constitutional: No distress.  Thin, frail elderly man awake and alert  HENT:  Head: Normocephalic and atraumatic.  Moist mucous membranes  Eyes: Conjunctivae are normal. Pupils are equal, round, and reactive to light.  Neck: Neck supple.  Cardiovascular: Normal rate and regular rhythm.   No murmur heard. Pulmonary/Chest: Effort normal and breath sounds normal. He exhibits no tenderness.  Abdominal: Soft. Bowel  sounds are normal. He exhibits no distension. There is no tenderness.  Musculoskeletal: He exhibits no deformity.  Mild swelling of dorsal foot without obvious deformity; 2+ DP pulses; normal ROM at hip and knee; no other extremity tenderness  Neurological: He is alert.  Oriented to person and place, able to follow commands, normal sensation throughout  Skin: Skin is warm and dry.  Skin tear L elbow  Nursing note and vitals reviewed.    ED Treatments / Results  Labs (all labs ordered are listed, but only abnormal results are displayed) Labs Reviewed - No data to display  EKG  EKG Interpretation None       Radiology Dg Ankle Complete Right  Result Date: 12/02/2015 CLINICAL DATA:  Un witnessed fall with ankle pain, initial encounter EXAM: RIGHT ANKLE - COMPLETE 3+ VIEW COMPARISON:  None. FINDINGS: Soft tissue swelling is noted. Some irregularity is noted at the distal fibula although well corticated likely related to prior trauma. No acute fracture or dislocation is seen. IMPRESSION: Soft tissue swelling without acute bony abnormality. Electronically Signed   By: Inez Catalina M.D.   On: 12/02/2015 15:37   Ct Head Wo Contrast  Result Date: 12/02/2015 CLINICAL DATA:  82 year old male with history of trauma from a fall. Dementia. EXAM: CT HEAD WITHOUT CONTRAST CT CERVICAL SPINE WITHOUT CONTRAST TECHNIQUE: Multidetector CT imaging of the head and cervical spine was performed following the standard protocol without intravenous contrast. Multiplanar CT image reconstructions of the cervical spine were also generated. COMPARISON:  Head CT and cervical spine CT 10/16/2015. FINDINGS: CT HEAD FINDINGS Brain: Moderate cerebral and mild cerebellar atrophy. Ex vacuo dilatation of the ventricular system, similar to the prior examination. Patchy and confluent areas of decreased attenuation are noted throughout the deep and periventricular white matter of the cerebral hemispheres bilaterally, compatible  with chronic microvascular ischemic disease. No acute intracranial abnormalities. Specifically, no evidence of acute intracranial hemorrhage, no definite findings of acute/subacute cerebral ischemia, no mass, mass effect, hydrocephalus or abnormal intra or extra-axial fluid collections. Vascular: No hyperdense vessel or unexpected calcification. Skull: Normal. Negative for fracture or focal lesion. Sinuses/Orbits: No acute finding. Other: None. CT CERVICAL SPINE FINDINGS Alignment: Normal. Skull  base and vertebrae: No acute fracture. No primary bone lesion or focal pathologic process. Soft tissues and spinal canal: No prevertebral fluid or swelling. No visible canal hematoma. Disc levels: Multilevel degenerative disc disease, most severe at C3-C4, C4-C5, C5-C6 and C6-C7. Mild multilevel facet arthropathy. Upper chest: Nodular pleuroparenchymal thickening throughout the lung apices, similar to prior examinations dating back to at least chest CT 02/10/2013, most compatible with chronic post infectious or inflammatory scarring. Paraseptal emphysema. Other: None. IMPRESSION: 1. No evidence of significant acute traumatic injury to the skull, brain or cervical spine. 2. Moderate cerebral atrophy with extensive chronic microvascular ischemic changes in the cerebral white matter redemonstrated, as above. 3. Multilevel degenerative disc disease and cervical spondylosis, as above. 4. Paraseptal emphysema. Electronically Signed   By: Vinnie Langton M.D.   On: 12/02/2015 15:53   Ct Cervical Spine Wo Contrast  Result Date: 12/02/2015 CLINICAL DATA:  82 year old male with history of trauma from a fall. Dementia. EXAM: CT HEAD WITHOUT CONTRAST CT CERVICAL SPINE WITHOUT CONTRAST TECHNIQUE: Multidetector CT imaging of the head and cervical spine was performed following the standard protocol without intravenous contrast. Multiplanar CT image reconstructions of the cervical spine were also generated. COMPARISON:  Head CT and  cervical spine CT 10/16/2015. FINDINGS: CT HEAD FINDINGS Brain: Moderate cerebral and mild cerebellar atrophy. Ex vacuo dilatation of the ventricular system, similar to the prior examination. Patchy and confluent areas of decreased attenuation are noted throughout the deep and periventricular white matter of the cerebral hemispheres bilaterally, compatible with chronic microvascular ischemic disease. No acute intracranial abnormalities. Specifically, no evidence of acute intracranial hemorrhage, no definite findings of acute/subacute cerebral ischemia, no mass, mass effect, hydrocephalus or abnormal intra or extra-axial fluid collections. Vascular: No hyperdense vessel or unexpected calcification. Skull: Normal. Negative for fracture or focal lesion. Sinuses/Orbits: No acute finding. Other: None. CT CERVICAL SPINE FINDINGS Alignment: Normal. Skull base and vertebrae: No acute fracture. No primary bone lesion or focal pathologic process. Soft tissues and spinal canal: No prevertebral fluid or swelling. No visible canal hematoma. Disc levels: Multilevel degenerative disc disease, most severe at C3-C4, C4-C5, C5-C6 and C6-C7. Mild multilevel facet arthropathy. Upper chest: Nodular pleuroparenchymal thickening throughout the lung apices, similar to prior examinations dating back to at least chest CT 02/10/2013, most compatible with chronic post infectious or inflammatory scarring. Paraseptal emphysema. Other: None. IMPRESSION: 1. No evidence of significant acute traumatic injury to the skull, brain or cervical spine. 2. Moderate cerebral atrophy with extensive chronic microvascular ischemic changes in the cerebral white matter redemonstrated, as above. 3. Multilevel degenerative disc disease and cervical spondylosis, as above. 4. Paraseptal emphysema. Electronically Signed   By: Vinnie Langton M.D.   On: 12/02/2015 15:53   Dg Foot Complete Right  Result Date: 12/02/2015 CLINICAL DATA:  Fall today with foot and  ankle pain, initial encounter EXAM: RIGHT FOOT COMPLETE - 3+ VIEW COMPARISON:  None. FINDINGS: There is no evidence of fracture or dislocation. There is no evidence of arthropathy or other focal bone abnormality. Soft tissues are unremarkable. IMPRESSION: No acute fracture dislocation is noted. No soft tissue abnormality is seen. Electronically Signed   By: Inez Catalina M.D.   On: 12/02/2015 15:34   Dg Hip Unilat With Pelvis 2-3 Views Right  Result Date: 12/02/2015 CLINICAL DATA:  Fall, right ankle pain EXAM: DG HIP (WITH OR WITHOUT PELVIS) 2-3V RIGHT COMPARISON:  10/16/2015 FINDINGS: Three views of the right hip submitted. No acute fracture or subluxation. No radiopaque foreign body. Atherosclerotic calcifications  of femoral artery. Moderate stool noted within rectum. IMPRESSION: No acute fracture or subluxation.  Moderate stool within rectum. Electronically Signed   By: Lahoma Crocker M.D.   On: 12/02/2015 15:36    Procedures Procedures (including critical care time)  Medications Ordered in ED Medications  bacitracin ointment 1 application (not administered)     Initial Impression / Assessment and Plan / ED Course  I have reviewed the triage vital signs and the nursing notes.  Pertinent  imaging results that were available during my care of the patient were reviewed by me and considered in my medical decision making (see chart for details).  Clinical Course   Patient with fall out of wheelchair, he was comfortable and well-appearing on exam. VS reassuring.The only complaint he had were with his right foot. CT of head and C-spine negative acute, plain films show mild soft tissue swelling without any injury. Nursing cleaned and bandaged skin tear. I reviewed chart which shows that tetanus is up-to-date. Patient discharged in satisfactory condition.  Final Clinical Impressions(s) / ED Diagnoses   Final diagnoses:  None    New Prescriptions New Prescriptions   No medications on file       Sharlett Iles, MD 12/02/15 [phone]

## 2015-12-02 NOTE — ED Notes (Signed)
[facility] has arrived to transport pt. Pt's daughter at bedside.

## 2015-12-02 NOTE — ED Notes (Signed)
Report given to [facility].

## 2015-12-02 NOTE — ED Triage Notes (Signed)
Pt brought in by EMS due to pt falling out of wheelchair at [facility] memory care facility. Pt is a&ox4, but has hx of dementia. Pt c/o back pain.

## 2015-12-02 NOTE — ED Notes (Signed)
[facility] contacted for tx to [place] 

## 2015-12-08 ENCOUNTER — Encounter (HOSPITAL_COMMUNITY): Payer: Self-pay | Admitting: Emergency Medicine

## 2015-12-08 ENCOUNTER — Emergency Department (HOSPITAL_COMMUNITY)

## 2015-12-08 ENCOUNTER — Emergency Department (HOSPITAL_COMMUNITY)
Admission: EM | Admit: 2015-12-08 | Discharge: 2015-12-08 | Disposition: A | Discharge status: Home / Self Care | Attending: Emergency Medicine | Admitting: Emergency Medicine

## 2015-12-08 DIAGNOSIS — Y999 Unspecified external cause status: Secondary | ICD-10-CM | POA: Insufficient documentation

## 2015-12-08 DIAGNOSIS — F17210 Nicotine dependence, cigarettes, uncomplicated: Secondary | ICD-10-CM | POA: Insufficient documentation

## 2015-12-08 DIAGNOSIS — F17290 Nicotine dependence, other tobacco product, uncomplicated: Secondary | ICD-10-CM | POA: Insufficient documentation

## 2015-12-08 DIAGNOSIS — S40012A Contusion of left shoulder, initial encounter: Secondary | ICD-10-CM | POA: Insufficient documentation

## 2015-12-08 DIAGNOSIS — E119 Type 2 diabetes mellitus without complications: Secondary | ICD-10-CM | POA: Diagnosis not present

## 2015-12-08 DIAGNOSIS — W06XXXA Fall from bed, initial encounter: Secondary | ICD-10-CM | POA: Diagnosis not present

## 2015-12-08 DIAGNOSIS — I10 Essential (primary) hypertension: Secondary | ICD-10-CM | POA: Diagnosis not present

## 2015-12-08 DIAGNOSIS — J441 Chronic obstructive pulmonary disease with (acute) exacerbation: Secondary | ICD-10-CM | POA: Diagnosis not present

## 2015-12-08 DIAGNOSIS — Z79899 Other long term (current) drug therapy: Secondary | ICD-10-CM | POA: Diagnosis not present

## 2015-12-08 DIAGNOSIS — Y939 Activity, unspecified: Secondary | ICD-10-CM | POA: Diagnosis not present

## 2015-12-08 DIAGNOSIS — Y929 Unspecified place or not applicable: Secondary | ICD-10-CM | POA: Insufficient documentation

## 2015-12-08 DIAGNOSIS — W19XXXA Unspecified fall, initial encounter: Secondary | ICD-10-CM

## 2015-12-08 DIAGNOSIS — R51 Headache: Secondary | ICD-10-CM | POA: Diagnosis not present

## 2015-12-08 DIAGNOSIS — S4992XA Unspecified injury of left shoulder and upper arm, initial encounter: Secondary | ICD-10-CM | POA: Diagnosis present

## 2015-12-08 MED ORDER — HYDROCODONE-ACETAMINOPHEN 5-325 MG PO TABS
1.0000 | ORAL_TABLET | Freq: Once | ORAL | Status: AC
  Administered 2015-12-08: 1 via ORAL
  Filled 2015-12-08: qty 1

## 2015-12-08 NOTE — ED Provider Notes (Signed)
[facility] DEPT Provider Note   CSN: [id number] Arrival date & time: 12/08/15  1937     History   Chief Complaint Chief Complaint  Patient presents with  . Fall  . Shoulder Injury    HPI Dale Mcfarland is a 82 y.o. male. Patient presents for evaluation of a fall. He resides at [facility]. He had a fall tonight he has injured his left shoulder. Also complains of pain in his neck. History of frequent falls.  HPI  Past Medical History:  Diagnosis Date  . Anemia    In the setting of UGI bleed 01/2011 requiring blood transfusion  . Atrial fibrillation ([place])   . Bronchitis   . Chronic back pain   . Collar bone fracture    left  . COPD (chronic obstructive pulmonary disease) ([place])   . Depression   . Diabetes mellitus without complication ([place])   . Dyspnea    Chronic, thought due to COPD. Echo 02/2010 with EF 30-35%, nuclear study negative, then repeat echo 03/2011 did not show systolic dysfxn, so unclear if truly HF  . Homelessness   . Hypertension   . Hyponatremia    Previously felt secondary to SIADH  . Pancreatitis   . Pneumonia    01/2011 - CAP vs aspiration pneuonmia  . PONV (postoperative nausea and vomiting)   . Upper GI bleed    01/2011 with EGD showing severe candida esophagitis and duodenal bulb erosion    Patient Active Problem List   Diagnosis Date Noted  . History of colonic polyps   . Benign neoplasm of ascending colon   . Benign neoplasm of transverse colon   . Benign neoplasm of descending colon   . Benign neoplasm of sigmoid colon   . Hematemesis without nausea   . Acute esophagitis   . Duodenitis   . Bleeding gastrointestinal   . Esophagitis   . COLD (chronic obstructive lung disease) ([place])   . Essential hypertension   . Alcohol abuse   . Protein-calorie malnutrition, severe ([place]) 02/19/2014  . Atrial flutter with rapid ventricular response ([place]) 02/16/2014  . Generalized abdominal pain   . Acute alcoholic pancreatitis  [id number]  . Diabetes mellitus without complication ([place]) [id number]  . CAP (community acquired pneumonia) 01/09/2014  . Atrial fibrillation with rapid ventricular response ([place]) 07/04/2013  . A-fib ([place]) 07/04/2013  . COPD exacerbation ([place]) 06/15/2013  . Community acquired pneumonia 06/15/2013  . Atrial fibrillation with RVR ([place]) 03/03/2013  . CHF exacerbation ([place]) 03/02/2013  . Normocytic anemia 03/01/2013  . Long QT interval 03/01/2013  . Influenza due to identified novel influenza A virus with other respiratory manifestations 03/01/2013  . GI bleed 01/06/2013  . Reflux esophagitis 01/06/2013  . Esophageal mass 12/03/2012  . Rib fractures 11/22/2012  . Elbow fracture, right 11/22/2012  . Benign neoplasm of colon 01/30/2012  . Heme positive stool 01/29/2012  . Dyspnea on exertion 01/29/2012  . Iron deficiency anemia, unspecified 01/29/2012  . Homelessness 05/04/2011  . CHF (congestive heart failure) ([place]) 03/28/2011  . COPD (chronic obstructive pulmonary disease) ([place]) 03/28/2011  . Tobacco abuse 03/28/2011  . Shortness of breath 03/28/2011  . Dehydration 02/27/2011  . Syncope 02/27/2011  . Pneumonia 02/02/2011  . Upper GI bleeding 02/02/2011  . Alcoholism ([place]) 02/02/2011  . Leukocytosis 02/02/2011  . Anemia associated with acute blood loss 02/02/2011    Past Surgical History:  Procedure Laterality Date  . APPENDECTOMY    . COLONOSCOPY    . COLONOSCOPY  01/30/2012  Procedure: COLONOSCOPY;  Surgeon: Ladene Artist, MD,FACG;  Location: [facility] ENDOSCOPY;  Service: Endoscopy;  Laterality: N/A;  . COLONOSCOPY N/A 04/20/2014   Procedure: COLONOSCOPY;  Surgeon: Jerene Bears, MD;  Location: [facility] ENDOSCOPY;  Service: Endoscopy;  Laterality: N/A;  . ESOPHAGOGASTRODUODENOSCOPY    . ESOPHAGOGASTRODUODENOSCOPY  02/03/2011   Procedure: ESOPHAGOGASTRODUODENOSCOPY (EGD);  Surgeon: Juanita Craver, MD;  Location: [facility] ENDOSCOPY;  Service: Endoscopy;  Laterality: N/A;  .  ESOPHAGOGASTRODUODENOSCOPY N/A 04/20/2014   Procedure: ESOPHAGOGASTRODUODENOSCOPY (EGD);  Surgeon: Jerene Bears, MD;  Location: [facility] ENDOSCOPY;  Service: Endoscopy;  Laterality: N/A;  . TONSILLECTOMY    . TONSILLECTOMY    . VASECTOMY         Home Medications    Prior to Admission medications   Medication Sig Start Date End Date Taking? Authorizing Provider  acetaminophen (TYLENOL) 650 MG CR tablet Take 650 mg by mouth every 8 (eight) hours as needed for pain.   Yes Historical Provider, MD  divalproex (DEPAKOTE SPRINKLE) 125 MG capsule Take 125 mg by mouth 3 (three) times daily.    Yes Historical Provider, MD  docusate sodium (COLACE) 100 MG capsule Take 100 mg by mouth at bedtime as needed for mild constipation or moderate constipation.    Yes Historical Provider, MD  ferrous sulfate 325 (65 FE) MG tablet Take 325 mg by mouth daily with breakfast.   Yes Historical Provider, MD  hydrocortisone cream 1 % Apply 1 application topically daily as needed for itching.   Yes Historical Provider, MD  LORazepam (ATIVAN) 1 MG tablet Take 1 mg by mouth 2 (two) times daily as needed (for agitation).    Yes Historical Provider, MD  magnesium hydroxide (MILK OF MAGNESIA) 400 MG/5ML suspension Take 30 mLs by mouth daily as needed for mild constipation or moderate constipation.    Yes Historical Provider, MD  mirtazapine (REMERON) 7.5 MG tablet Take 7.5 mg by mouth at bedtime.   Yes Historical Provider, MD  Multiple Vitamin (MULTIVITAMIN WITH MINERALS) TABS tablet Take 1 tablet by mouth daily.   Yes Historical Provider, MD  pantoprazole (PROTONIX) 40 MG tablet Take 1 tablet (40 mg total) by mouth 2 (two) times daily before a meal. 04/21/14  Yes Elberta Leatherwood, MD  risperiDONE (RISPERDAL) 0.5 MG tablet Take 0.5 mg by mouth at bedtime.   Yes Historical Provider, MD  rivastigmine (EXELON) 3 MG capsule Take 3 mg by mouth 2 (two) times daily.   Yes Historical Provider, MD  traZODone (DESYREL) 50 MG tablet Take 50 mg by  mouth 3 (three) times daily as needed (anxiety/agitation).    Yes Historical Provider, MD    Family History Family History  Problem Relation Age of Onset  . Coronary artery disease Father     Starting in his 23's. Died of massive MI at age 23.  . Schizophrenia Brother   . Coronary artery disease Sister     MI at age 24  . Alzheimer's disease Mother     Social History Social History  Substance Use Topics  . Smoking status: Current Some Day Smoker    Packs/day: 1.00    Years: 60.00    Types: Cigarettes, Cigars  . Smokeless tobacco: Never Used     Comment: Since age 36  . Alcohol use Yes     Comment: 174 ml of scotch weekly ---- "I only drink by the drink and speicial occasion/celebrate." 04/17/14     Allergies   Benadryl [diphenhydramine hcl]; Geodon [ziprasidone hcl]; and Penicillins  Review of Systems Review of Systems  Constitutional: Negative for appetite change, chills, diaphoresis, fatigue and fever.  HENT: Negative for mouth sores, sore throat and trouble swallowing.   Eyes: Negative for visual disturbance.  Respiratory: Negative for cough, chest tightness, shortness of breath and wheezing.   Cardiovascular: Negative for chest pain.  Gastrointestinal: Negative for abdominal distention, abdominal pain, diarrhea, nausea and vomiting.  Endocrine: Negative for polydipsia, polyphagia and polyuria.  Genitourinary: Negative for dysuria, frequency and hematuria.  Musculoskeletal: Negative for gait problem.       Left shoulder and left knee pain.  Skin: Negative for color change, pallor and rash.  Neurological: Negative for dizziness, syncope, light-headedness and headaches.  Hematological: Does not bruise/bleed easily.  Psychiatric/Behavioral: Negative for behavioral problems and confusion.     Physical Exam Updated Vital Signs BP 172/76 (BP Location: Right Arm)   Pulse 66   Temp 97.6 F (36.4 C) (Oral)   Resp 18   SpO2 100%   Physical Exam  Constitutional: He  is oriented to person, place, and time. He appears well-developed and well-nourished. No distress.  Thin and emaciated  HENT:  Head: Normocephalic.  Eyes: Conjunctivae are normal. Pupils are equal, round, and reactive to light. No scleral icterus.  Neck: Normal range of motion. Neck supple. No thyromegaly present.  Cardiovascular: Normal rate and regular rhythm.  Exam reveals no gallop and no friction rub.   No murmur heard. Pulmonary/Chest: Effort normal and breath sounds normal. No respiratory distress. He has no wheezes. He has no rales.  Abdominal: Soft. Bowel sounds are normal. He exhibits no distension. There is no tenderness. There is no rebound.  Musculoskeletal: Normal range of motion.  Range of motion of the left knee. Chronic appearing deformity of left shoulder. No midline neck pain.  Neurological: He is alert and oriented to person, place, and time.  Skin: Skin is warm and dry. No rash noted.  Psychiatric: He has a normal mood and affect. His behavior is normal.     ED Treatments / Results  Labs (all labs ordered are listed, but only abnormal results are displayed) Labs Reviewed - No data to display  EKG  EKG Interpretation None       Radiology Ct Head Wo Contrast  Result Date: 12/08/2015 CLINICAL DATA:  Unwitnessed fall this evening.  Found on floor. EXAM: CT HEAD WITHOUT CONTRAST CT CERVICAL SPINE WITHOUT CONTRAST TECHNIQUE: Multidetector CT imaging of the head and cervical spine was performed following the standard protocol without intravenous contrast. Multiplanar CT image reconstructions of the cervical spine were also generated. COMPARISON:  12/02/2015. FINDINGS: CT HEAD FINDINGS BRAIN: The ventricles and sulci are prominent consistent with central and superficial atrophy. No intraparenchymal hemorrhage, mass effect nor midline shift. Moderate degree of patchy supratentorial white matter hypoattenuation are again noted compatible with moderate chronic small vessel  ischemic disease. No acute large vascular territory infarcts. No abnormal extra-axial fluid collections. Basal cisterns are patent. VASCULAR: Moderate calcific atherosclerosis of the carotid siphons. SKULL: No skull fracture. No significant scalp soft tissue swelling. SINUSES/ORBITS: The mastoid air-cells and included paranasal sinuses are well-aerated.The included ocular globes and orbital contents are non-suspicious. OTHER: None. CT CERVICAL SPINE FINDINGS Alignment: Slight straightening of cervical lordosis possibly from muscle spasm or positioning. The atlantodental interval and craniocervical relationship maintained. Skull base and vertebrae: No skull base fracture nor acute osseous abnormality of the vertebral bodies. Soft tissues and spinal canal: No prevertebral soft tissue swelling nor hemorrhage. Disc levels: Cervical spondylosis with multilevel  degenerative disc disease most severely affecting C3 through C7 with multilevel mild facet arthropathy. Upper chest: No acute abnormality. Mild right apical paraseptal emphysema. Minimal chronic apical scarring bilaterally. Other: IMPRESSION: Chronic stable cerebral atrophy and moderate degree of microvascular ischemic disease of periventricular white matter. No acute intracranial abnormality. Cervical spondylosis without acute osseous abnormality. Electronically Signed   By: Ashley Royalty M.D.   On: 12/08/2015 21:08   Ct Cervical Spine Wo Contrast  Result Date: 12/08/2015 CLINICAL DATA:  Unwitnessed fall this evening.  Found on floor. EXAM: CT HEAD WITHOUT CONTRAST CT CERVICAL SPINE WITHOUT CONTRAST TECHNIQUE: Multidetector CT imaging of the head and cervical spine was performed following the standard protocol without intravenous contrast. Multiplanar CT image reconstructions of the cervical spine were also generated. COMPARISON:  12/02/2015. FINDINGS: CT HEAD FINDINGS BRAIN: The ventricles and sulci are prominent consistent with central and superficial  atrophy. No intraparenchymal hemorrhage, mass effect nor midline shift. Moderate degree of patchy supratentorial white matter hypoattenuation are again noted compatible with moderate chronic small vessel ischemic disease. No acute large vascular territory infarcts. No abnormal extra-axial fluid collections. Basal cisterns are patent. VASCULAR: Moderate calcific atherosclerosis of the carotid siphons. SKULL: No skull fracture. No significant scalp soft tissue swelling. SINUSES/ORBITS: The mastoid air-cells and included paranasal sinuses are well-aerated.The included ocular globes and orbital contents are non-suspicious. OTHER: None. CT CERVICAL SPINE FINDINGS Alignment: Slight straightening of cervical lordosis possibly from muscle spasm or positioning. The atlantodental interval and craniocervical relationship maintained. Skull base and vertebrae: No skull base fracture nor acute osseous abnormality of the vertebral bodies. Soft tissues and spinal canal: No prevertebral soft tissue swelling nor hemorrhage. Disc levels: Cervical spondylosis with multilevel degenerative disc disease most severely affecting C3 through C7 with multilevel mild facet arthropathy. Upper chest: No acute abnormality. Mild right apical paraseptal emphysema. Minimal chronic apical scarring bilaterally. Other: IMPRESSION: Chronic stable cerebral atrophy and moderate degree of microvascular ischemic disease of periventricular white matter. No acute intracranial abnormality. Cervical spondylosis without acute osseous abnormality. Electronically Signed   By: Ashley Royalty M.D.   On: 12/08/2015 21:08   Dg Shoulder Left  Result Date: 12/08/2015 CLINICAL DATA:  Left shoulder pain after unwitnessed fall EXAM: LEFT SHOULDER - 2+ VIEW COMPARISON:  Left shoulder radiographs from 08/17/2007 FINDINGS: There are is a chronic nonunited left clavicular fracture with 1 shaft with caudal displacement of the distal fracture fragment unchanged in appearance.  Old posttraumatic deformity of the superolateral humeral head with small ossific density adjacent to it is again seen. No acute displaced fracture or bone destruction is noted. IMPRESSION: Chronic ununited left clavicular fracture. Chronic superolateral humeral head deformity from old remote trauma. No acute osseous abnormality. No acute malalignment. Electronically Signed   By: Ashley Royalty M.D.   On: 12/08/2015 21:19   Dg Knee Complete 4 Views Left  Result Date: 12/08/2015 CLINICAL DATA:  Left knee pain after fall. EXAM: LEFT KNEE - COMPLETE 4+ VIEW COMPARISON:  None. FINDINGS: No evidence of fracture, dislocation, or joint effusion. Moderate narrowing of medial joint space is noted. Vascular calcifications are noted. IMPRESSION: Moderate degenerative joint disease is noted medially. No acute abnormality seen the left knee. Electronically Signed   By: Marijo Conception, M.D.   On: 12/08/2015 21:21    Procedures Procedures (including critical care time)  Medications Ordered in ED Medications  HYDROcodone-acetaminophen (NORCO/VICODIN) 5-325 MG per tablet 1 tablet (1 tablet Oral Given 12/08/15 2154)     Initial Impression / Assessment and Plan /  ED Course  I have reviewed the triage vital signs and the nursing notes.  Pertinent labs & imaging results that were available during my care of the patient were reviewed by me and considered in my medical decision making (see chart for details).  Clinical Course    Old healed abnormalities of the shoulder. No acute abnormalities of CT head and neck. No abnormalities of the knee.  Her discharge back to his facility.  Final Clinical Impressions(s) / ED Diagnoses   Final diagnoses:  Fall, initial encounter  Contusion of left shoulder, initial encounter    New Prescriptions New Prescriptions   No medications on file     Tanna Furry, MD 12/08/15 2346

## 2015-12-08 NOTE — ED Triage Notes (Signed)
Brought in by EMS from [facility] with c/o left shoulder pain after his unwitnessed fall tonight.  Pt was found lying on the floor beside his bed.  Pt c/o left shoulder pain and left knee pain after his fall.

## 2015-12-08 NOTE — ED Notes (Signed)
[facility] called for transport.  

## 2015-12-08 NOTE — ED Notes (Signed)
Bed: [facility] Expected date:  Expected time:  Means of arrival:  Comments: EMS 82 yo male fall from SNF-unwitnessed fall-deformity left collar bone area/[facility]

## 2015-12-08 NOTE — Discharge Instructions (Signed)
Motrin or Tylenol as needed for pain.

## 2016-03-14 DEATH — deceased
